# Patient Record
Sex: Male | Born: 1955
Health system: Southern US, Community
[De-identification: ages and names within clinical notes are randomized; demographics above are authoritative.]

## PROBLEM LIST (undated history)

## (undated) DIAGNOSIS — R29898 Other symptoms and signs involving the musculoskeletal system: Secondary | ICD-10-CM

## (undated) DIAGNOSIS — I10 Essential (primary) hypertension: Secondary | ICD-10-CM

## (undated) DIAGNOSIS — T7840XA Allergy, unspecified, initial encounter: Secondary | ICD-10-CM

## (undated) DIAGNOSIS — D649 Anemia, unspecified: Secondary | ICD-10-CM

## (undated) DIAGNOSIS — R4781 Slurred speech: Secondary | ICD-10-CM

## (undated) DIAGNOSIS — N400 Enlarged prostate without lower urinary tract symptoms: Secondary | ICD-10-CM

## (undated) DIAGNOSIS — N186 End stage renal disease: Secondary | ICD-10-CM

## (undated) DIAGNOSIS — N289 Disorder of kidney and ureter, unspecified: Secondary | ICD-10-CM

## (undated) DIAGNOSIS — B029 Zoster without complications: Secondary | ICD-10-CM

## (undated) HISTORY — DX: Disorder of kidney and ureter, unspecified: N28.9

## (undated) HISTORY — DX: Essential (primary) hypertension: I10

## (undated) HISTORY — DX: Zoster without complications: B02.9

## (undated) HISTORY — DX: Allergy, unspecified, initial encounter: T78.40XA

## (undated) HISTORY — DX: Benign prostatic hyperplasia without lower urinary tract symptoms: N40.0

---
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
## 1999-12-10 ENCOUNTER — Emergency Department (HOSPITAL_COMMUNITY): Admission: EM | Admit: 1999-12-10 | Discharge: 1999-12-10 | Payer: Self-pay | Admitting: Emergency Medicine

## 1999-12-10 ENCOUNTER — Encounter: Payer: Self-pay | Admitting: Emergency Medicine

## 2000-01-03 ENCOUNTER — Emergency Department (HOSPITAL_COMMUNITY): Admission: EM | Admit: 2000-01-03 | Discharge: 2000-01-03 | Payer: Self-pay | Admitting: Emergency Medicine

## 2001-01-26 ENCOUNTER — Encounter: Payer: Self-pay | Admitting: Internal Medicine

## 2001-01-26 LAB — CONVERTED CEMR LAB: PSA: 0.5 ng/mL

## 2001-01-29 ENCOUNTER — Encounter: Payer: Self-pay | Admitting: Family Medicine

## 2005-12-01 DIAGNOSIS — B029 Zoster without complications: Secondary | ICD-10-CM

## 2005-12-01 HISTORY — DX: Zoster without complications: B02.9

## 2006-02-05 ENCOUNTER — Emergency Department (HOSPITAL_COMMUNITY): Admission: EM | Admit: 2006-02-05 | Discharge: 2006-02-05 | Payer: Self-pay | Admitting: Family Medicine

## 2006-02-07 ENCOUNTER — Ambulatory Visit: Payer: Self-pay | Admitting: Infectious Diseases

## 2006-02-07 ENCOUNTER — Inpatient Hospital Stay (HOSPITAL_COMMUNITY): Admission: EM | Admit: 2006-02-07 | Discharge: 2006-02-10 | Payer: Self-pay | Admitting: Family Medicine

## 2006-02-20 ENCOUNTER — Ambulatory Visit: Payer: Self-pay | Admitting: Family Medicine

## 2006-03-09 ENCOUNTER — Ambulatory Visit: Payer: Self-pay | Admitting: Family Medicine

## 2007-01-17 ENCOUNTER — Emergency Department (HOSPITAL_COMMUNITY): Admission: EM | Admit: 2007-01-17 | Discharge: 2007-01-17 | Payer: Self-pay | Admitting: Emergency Medicine

## 2007-02-01 ENCOUNTER — Ambulatory Visit: Payer: Self-pay | Admitting: Internal Medicine

## 2007-02-15 ENCOUNTER — Ambulatory Visit: Payer: Self-pay | Admitting: Internal Medicine

## 2007-03-18 ENCOUNTER — Ambulatory Visit: Payer: Self-pay | Admitting: Internal Medicine

## 2007-03-18 LAB — CONVERTED CEMR LAB
Albumin: 4.1 g/dL (ref 3.5–5.2)
BUN: 16 mg/dL (ref 6–23)
CO2: 30 meq/L (ref 19–32)
Calcium: 9.7 mg/dL (ref 8.4–10.5)
Chloride: 97 meq/L (ref 96–112)
Creatinine, Ser: 1.4 mg/dL (ref 0.4–1.5)
GFR calc Af Amer: 69 mL/min
GFR calc non Af Amer: 57 mL/min
Glucose, Bld: 97 mg/dL (ref 70–99)
Phosphorus: 2.6 mg/dL (ref 2.3–4.6)
Potassium: 3.3 meq/L — ABNORMAL LOW (ref 3.5–5.1)
Sodium: 137 meq/L (ref 135–145)

## 2007-03-26 ENCOUNTER — Ambulatory Visit: Payer: Self-pay | Admitting: Internal Medicine

## 2007-03-26 LAB — CONVERTED CEMR LAB: Potassium: 3.5 meq/L (ref 3.5–5.1)

## 2007-08-19 ENCOUNTER — Encounter: Payer: Self-pay | Admitting: Internal Medicine

## 2007-08-19 DIAGNOSIS — I10 Essential (primary) hypertension: Secondary | ICD-10-CM

## 2007-09-13 ENCOUNTER — Ambulatory Visit: Payer: Self-pay | Admitting: Internal Medicine

## 2007-09-17 LAB — CONVERTED CEMR LAB
Albumin: 4 g/dL (ref 3.5–5.2)
BUN: 23 mg/dL (ref 6–23)
CO2: 30 meq/L (ref 19–32)
Calcium: 9.7 mg/dL (ref 8.4–10.5)
Chloride: 101 meq/L (ref 96–112)
Cholesterol: 183 mg/dL (ref 0–200)
Creatinine, Ser: 1.6 mg/dL — ABNORMAL HIGH (ref 0.4–1.5)
GFR calc Af Amer: 59 mL/min
GFR calc non Af Amer: 48 mL/min
Glucose, Bld: 108 mg/dL — ABNORMAL HIGH (ref 70–99)
HDL: 45.1 mg/dL (ref >39.0)
LDL Cholesterol: 112 mg/dL — ABNORMAL HIGH (ref 0–99)
PSA: 0.76 ng/mL (ref 0.10–4.00)
Phosphorus: 1.8 mg/dL — ABNORMAL LOW (ref 2.3–4.6)
Potassium: 3.2 meq/L — ABNORMAL LOW (ref 3.5–5.1)
Sodium: 140 meq/L (ref 135–145)
Total CHOL/HDL Ratio: 4.1
Triglycerides: 129 mg/dL (ref 0–149)
VLDL: 26 mg/dL (ref 0–40)

## 2008-01-04 ENCOUNTER — Ambulatory Visit: Payer: Self-pay | Admitting: Internal Medicine

## 2008-01-04 DIAGNOSIS — N400 Enlarged prostate without lower urinary tract symptoms: Secondary | ICD-10-CM

## 2008-03-06 ENCOUNTER — Ambulatory Visit: Payer: Self-pay | Admitting: Internal Medicine

## 2008-03-06 DIAGNOSIS — N529 Male erectile dysfunction, unspecified: Secondary | ICD-10-CM | POA: Insufficient documentation

## 2008-05-09 ENCOUNTER — Ambulatory Visit: Payer: Self-pay | Admitting: Internal Medicine

## 2010-02-28 ENCOUNTER — Ambulatory Visit: Payer: Self-pay | Admitting: Family Medicine

## 2010-02-28 DIAGNOSIS — I10 Essential (primary) hypertension: Secondary | ICD-10-CM

## 2010-02-28 DIAGNOSIS — M549 Dorsalgia, unspecified: Secondary | ICD-10-CM | POA: Insufficient documentation

## 2010-03-04 ENCOUNTER — Telehealth: Payer: Self-pay | Admitting: Internal Medicine

## 2010-03-05 LAB — CONVERTED CEMR LAB
ALT: 24 units/L (ref 0–53)
AST: 26 units/L (ref 0–37)
Albumin: 4.1 g/dL (ref 3.5–5.2)
Alkaline Phosphatase: 83 units/L (ref 39–117)
BUN: 22 mg/dL (ref 6–23)
Bilirubin, Direct: 0 mg/dL (ref 0.0–0.3)
CO2: 30 meq/L (ref 19–32)
Calcium: 9.1 mg/dL (ref 8.4–10.5)
Chloride: 98 meq/L (ref 96–112)
Creatinine, Ser: 2.6 mg/dL — ABNORMAL HIGH (ref 0.4–1.5)
Creatinine,U: 141 mg/dL
GFR calc non Af Amer: 33.16 mL/min (ref >60)
Glucose, Bld: 113 mg/dL — ABNORMAL HIGH (ref 70–99)
Microalb Creat Ratio: 5337.6 mg/g — ABNORMAL HIGH (ref 0.0–30.0)
Microalb, Ur: 752.6 mg/dL — ABNORMAL HIGH (ref 0.0–1.9)
Phosphorus: 2.8 mg/dL (ref 2.3–4.6)
Potassium: 3.6 meq/L (ref 3.5–5.1)
Sodium: 137 meq/L (ref 135–145)
Total Bilirubin: 0.3 mg/dL (ref 0.3–1.2)
Total Protein: 7.3 g/dL (ref 6.0–8.3)

## 2010-03-07 ENCOUNTER — Ambulatory Visit: Payer: Self-pay | Admitting: Internal Medicine

## 2010-04-08 ENCOUNTER — Ambulatory Visit: Payer: Self-pay | Admitting: Internal Medicine

## 2010-04-10 LAB — CONVERTED CEMR LAB
Albumin: 4.2 g/dL (ref 3.5–5.2)
BUN: 25 mg/dL — ABNORMAL HIGH (ref 6–23)
CO2: 26 meq/L (ref 19–32)
Calcium: 9.6 mg/dL (ref 8.4–10.5)
Chloride: 105 meq/L (ref 96–112)
Creatinine, Ser: 2.1 mg/dL — ABNORMAL HIGH (ref 0.4–1.5)
GFR calc non Af Amer: 41.49 mL/min (ref >60)
Glucose, Bld: 94 mg/dL (ref 70–99)
Phosphorus: 2.9 mg/dL (ref 2.3–4.6)
Potassium: 4.4 meq/L (ref 3.5–5.1)
Sodium: 140 meq/L (ref 135–145)

## 2010-12-31 NOTE — Assessment & Plan Note (Signed)
Summary: 1:30 check BP/ds   Vital Signs:  Patient profile:   55 year old male Weight:      207 pounds Temp:     98.7 degrees F oral Pulse rate:   80 / minute Pulse rhythm:   regular BP sitting:   170 / 80  (left arm) Cuff size:   large  Vitals Entered By: Edwin Dada CMA Deborra Medina) (March 07, 2010 1:34 PM)  Serial Vital Signs/Assessments:  Time      Position  BP       Pulse  Resp  Temp     By           R Arm     180/92                         Claris Gower MD  CC: back pain and check BP   History of Present Illness: Back pain is better Relates this to past problem when he had a kidney infection No urinary urgency or dysuria  Started the BP meds and it wasn't low enough Now taking both two times a day  BP much better on this regimen  No chest pain No SOB No edema  He is willing to take these meds since he has not noted any ED on this regimen  Has switched to salt substitute  Allergies: 1)  * Sulfa (Sulfonamides) Group 2)  Chlorthalidone (Chlorthalidone)  Past History:  Past medical, surgical, family and social histories (including risk factors) reviewed for relevance to current acute and chronic problems.  Past Medical History: Reviewed history from 01/04/2008 and no changes required. Allergic rhinitis Hypertension Renal insufficiency Benign prostatic hypertrophy  Past Surgical History: Reviewed history from 08/19/2007 and no changes required. Facial [place]- hospital 2007  Family History: Reviewed history from 08/19/2007 and no changes required. HTN in Mom and brother No DM or CAD No prostate or colon cancer  Social History: Reviewed history from 08/19/2007 and no changes required. Marital Status: Married Children: 2 Healthy Occupation: Museum/gallery curator, building new homes Never Smoked Alcohol use-occ  Review of Systems       appetite is okay weight stable  Physical Exam  General:  alert and normal appearance.   Neck:  supple, no masses, no  thyromegaly, and no cervical lymphadenopathy.   Lungs:  normal respiratory effort and normal breath sounds.   Heart:  normal rate, regular rhythm, no murmur, and no gallop.   Abdomen:  soft and non-tender.   No abdominal bruits Extremities:  no edema Psych:  normally interactive, good eye contact, not anxious appearing, and not depressed appearing.     Impression & Recommendations:  Problem # 1:  HYPERTENSION, MALIGNANT (ICD-401.0) Assessment Improved BP markedly better he accepts these meds since no ED markedly proteinuria and creat up to 2.6  P: continue increased doses     recheck 1 month with repeat renal panel    didn't tolerate lisinopril (though not clear cut)    may be worth trial of losartan but I will send to nephrology if creat not down at next visit  His updated medication list for this problem includes:    Amlodipine Besylate 10 Mg Tabs (Amlodipine besylate) .Marland Kitchen... 1 by mouth two times a day for high blood pressure    Bystolic 10 Mg Tabs (Nebivolol hcl) .Marland Kitchen... 1 by mouth two times a day for high blood pressure  BP today: 170/80 Prior BP: 200/120 (02/28/2010)  Labs Reviewed: K+:  3.6 (02/28/2010) Creat: : 2.6 (02/28/2010)   Chol: 183 (09/13/2007)   HDL: 45.1 (09/13/2007)   LDL: 112 (09/13/2007)   TG: 129 (09/13/2007)  Complete Medication List: 1)  Amlodipine Besylate 10 Mg Tabs (Amlodipine besylate) .Marland Kitchen.. 1 by mouth two times a day for high blood pressure 2)  Bystolic 10 Mg Tabs (Nebivolol hcl) .Marland Kitchen.. 1 by mouth two times a day for high blood pressure  Patient Instructions: 1)  Please schedule a follow-up appointment in 1 month.  Prescriptions: BYSTOLIC 10 MG TABS (NEBIVOLOL HCL) 1 by mouth two times a day for high blood pressure  #60 x 11   Entered and Authorized by:   Claris Gower MD   Signed by:   Claris Gower MD on 03/07/2010   Method used:   Print then Give to Patient   RxID:   [id number] AMLODIPINE BESYLATE 10 MG TABS (AMLODIPINE BESYLATE) 1  by mouth two times a day for high blood pressure  #60 x 11   Entered and Authorized by:   Claris Gower MD   Signed by:   Claris Gower MD on 03/07/2010   Method used:   Print then Give to Patient   RxIDWR:[id number]   Current Allergies (reviewed today): * SULFA (SULFONAMIDES) GROUP CHLORTHALIDONE (CHLORTHALIDONE)

## 2010-12-31 NOTE — Progress Notes (Signed)
Summary: BP reading  Phone Note Call from Patient Call back at [phone]   Caller: Wife,  [place] Schuman Call For: Claris Gower MD Summary of Call: wife calling with BP readings: Sat 174/105, Sun 172/120, Monday A.M. 185/115, pt bought his own BP machine and that's how they were checking it. Pt wife states the meds are working. Initial call taken by: Edwin Dada CMA Deborra Medina),  March 04, 2010 9:43 AM  Follow-up for Phone Call        Pt's wife called to report that pt was unable to take the pain medications that were given to him for his back- these caused nausea, he is using eucalyptus cream.    [place]  March 04, 2010 11:09 AM   Please call her These are better but still quite high he still needs an appt this week to reevaluate---please make sure they schedule Follow-up by: Claris Gower MD,  March 04, 2010 1:56 PM  Additional Follow-up for Phone Call Additional follow up Details #1::        spoke with wife and she will TRY and get pt to come in for appt, she will call back and schedule. DeShannon Smith CMA Deborra Medina)  March 04, 2010 3:21 PM   That does not make me very optimistic we cannot refill any meds without seeing him!!! Additional Follow-up by: Claris Gower MD,  March 04, 2010 3:40 PM

## 2010-12-31 NOTE — Assessment & Plan Note (Signed)
Summary: BACK PAIN/CLE   Vital Signs:  Patient profile:   55 year old male Height:      68 inches Weight:      206 pounds BMI:     31.44 Temp:     98.3 degrees F oral Pulse rate:   72 / minute Pulse rhythm:   regular BP sitting:   200 / 120  (left arm) Cuff size:   large  Vitals Entered By: Zenda Alpers CMA Deborra Medina) (February 28, 2010 10:38 AM)  Serial Vital Signs/Assessments:  Time      Position  BP       Pulse  Resp  Temp     By                     240/140                        Owens Loffler MD                     240/140                        Owens Loffler MD   History of Present Illness: Chief complaint left hip pain for 8 days  55 year old with left hip pain and also found to have malignant hypertension.  1. Back pain: Woke up and on his left back, is a tooth ache an dreally hurting bad. Every once and a while will hurt pretty bad. Hurting bad.   Jumped off of sometime and did not really  Fixing a leak and jumped off about two weeks ago.    L post back pain No bowel or bladder incontince  2. Malignant HTN:  refuses admission. He has not taken his blood pressure medication in 6 months Feels fine stopped it due to interference with erectile quality  To more clearly define VS: Bp initially [id number] systolic, taken by CMA student. Rechecked by CMA, 240/140 given Clonidine 0.1 mg in office, repeat 240/140 Then given Clonodine 0.1 mg and Bystolic 10 mg in the office On recheck 200/100, and patient d/c to home.   Allergies: 1)  * Sulfa (Sulfonamides) Group 2)  Chlorthalidone (Chlorthalidone)  Past History:  Past medical, surgical, family and social histories (including risk factors) reviewed, and no changes noted (except as noted below).  Past Medical History: Reviewed history from 01/04/2008 and no changes required. Allergic rhinitis Hypertension Renal insufficiency Benign prostatic hypertrophy  Past Surgical History: Reviewed history from 08/19/2007  and no changes required. Facial [place]- hospital 2007  Family History: Reviewed history from 08/19/2007 and no changes required. HTN in Mom and brother No DM or CAD No prostate or colon cancer  Social History: Reviewed history from 08/19/2007 and no changes required. 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  General:  Denies chills, fatigue, and fever. CV:  Denies chest pain or discomfort, palpitations, and shortness of breath with exertion. Resp:  Denies cough. GI:  Denies nausea. GU:  Complains of erectile dysfunction; denies incontinence. MS:  Complains of low back pain, muscle aches, and stiffness; denies joint pain, joint redness, joint swelling, and loss of strength. Neuro:  Denies tingling.  Physical Exam  General:  Well-developed,well-nourished,in no acute distress; alert,appropriate and cooperative throughout examination Head:  Normocephalic and atraumatic without obvious abnormalities. No apparent alopecia or balding. Ears:  External ear exam shows no significant lesions or deformities.  Otoscopic examination reveals clear canals, tympanic membranes are intact bilaterally without bulging, retraction, inflammation or discharge. Hearing is grossly normal bilaterally. Nose:  External nasal examination shows no deformity or inflammation. Nasal mucosa are pink and moist without lesions or exudates. Mouth:  Oral mucosa and oropharynx without lesions or exudates.  Teeth in good repair. Neck:  No deformities, masses, or tenderness noted. Lungs:  Normal respiratory effort, chest expands symmetrically. Lungs are clear to auscultation, no crackles or wheezes. Heart:  Normal rate and regular rhythm. S1 and S2 normal  without gallop, murmur, click, rub or other extra sounds. Abdomen:  Bowel sounds positive,abdomen soft and non-tender without masses, organomegaly or hernias noted. Msk:  Inspection/Deformity: N Paraspinus T: Y - L lumber L5, at sciatic notch  B Ankle Dorsiflexion (L5,4): 5/5 B Great Toe Dorsiflexion (L5,4): 5/5 Heel Walk (L5): WNL Toe Walk (S1): WNL Rise/Squat (L4): WNL, mild pain  SENSORY B Medial Foot (L4): WNL B Dorsum (L5): WNL B Lateral (S1): WNL Light Touch: WNL Pinprick: WNL  REFLEXES Knee (L4): 2+ Ankle (S1): 2+  B SLR, seated: neg B SLR, supine: neg B FABER: neg B Reverse FABER: neg B Greater Troch: NT B Sciatic Notch: L TTP Extremities:  No clubbing, cyanosis, edema, or deformity noted with normal full range of motion of all joints.   Neurologic:  alert & oriented X3, sensation intact to light touch, and gait normal.   Cervical Nodes:  No lymphadenopathy noted Psych:  Cognition and judgment appear intact. Alert and cooperative with normal attention span and concentration. No apparent delusions, illusions, hallucinations   Impression & Recommendations:  Problem # 1:  BACK PAIN (ICD-724.5) Assessment New muscular back pain reviewed care, muscle relaxants, pain meds as needed moist heat  no NSAIDS with BP acutely high like now  His updated medication list for this problem includes:    Tizanidine Hcl 4 Mg Tabs (Tizanidine hcl) .Marland Kitchen... 1 by mouth at bedtime as needed back pain    Tramadol Hcl 50 Mg Tabs (Tramadol hcl) .Marland Kitchen... 1 by mouth 4 times daily as needed pain  Problem # 2:  HYPERTENSION, MALIGNANT (ICD-401.0) Assessment: New we discussed at this  is a life-threatening  blood pressure level.  We also discussed that classically with a blood pressure of 240/140,  inpatient admission until BP is adequately controlled is recommended. The patient declined admission, but was willing to get blood work and go back on medications. Discussed that BP at this high puts  him at serious risk for stroke.  Patient kept in office >1 hour, total, intermittent checking, intermittent discussion about these issues. Stable with BP [id number] systolic at time of d/c, given #14 day supply of Bystolic 10 mg in the office. Due to side effect profile with erections, Amlodipine and Bystolic chosen.   f/u with Dr. Silvio Pate in 1 week.  The following medications were removed from the medication list:    Amlodipine Besylate 10 Mg Tabs (Amlodipine besylate) .Marland Kitchen... 1 tablet by mouth every morning    Doxazosin Mesylate 4 Mg Tabs (Doxazosin mesylate) .Marland Kitchen... 1 at bedtime for enlarged prostate    Lisinopril 20 Mg Tabs (Lisinopril) .Marland Kitchen... 1 tablet daily at bedtime His updated medication list for this problem includes:    Amlodipine Besylate 10  Mg Tabs (Amlodipine besylate) .Marland Kitchen... 1 by mouth daily    Bystolic 10 Mg Tabs (Nebivolol hcl) .Marland Kitchen... 1 by mouth daily  Orders: Venipuncture [id number]) TLB-Renal Function Panel (80069-RENAL) TLB-Hepatic/Liver Function Pnl (80076-HEPATIC) TLB-Microalbumin/Creat Ratio, Urine (82043-MALB)  Problem # 3:  ERECTILE DYSFUNCTION, ORGANIC [id number]) Assessment: New chose BP meds in light of this.  Complete Medication List: 1)  Amlodipine Besylate 10 Mg Tabs (Amlodipine besylate) .Marland Kitchen.. 1 by mouth daily 2)  Bystolic 10 Mg Tabs (Nebivolol hcl) .Marland Kitchen.. 1 by mouth daily 3)  Tizanidine Hcl 4 Mg Tabs (Tizanidine hcl) .Marland Kitchen.. 1 by mouth at bedtime as needed back pain 4)  Tramadol Hcl 50 Mg Tabs (Tramadol hcl) .Marland Kitchen.. 1 by mouth 4 times daily as needed pain  Patient Instructions: 1)  f/u 1 week with Dr. Silvio Pate Prescriptions: TRAMADOL HCL 50 MG  TABS (TRAMADOL HCL) 1 by mouth 4 times daily as needed pain  #40 x 2   Entered and Authorized by:   Owens Loffler MD   Signed by:   Owens Loffler MD on 02/28/2010   Method used:   Print then Give to Patient   RxID:   [id number] TIZANIDINE HCL 4 MG TABS (TIZANIDINE HCL) 1 by mouth at bedtime as needed back pain  #30 x 2    Entered and Authorized by:   Owens Loffler MD   Signed by:   Owens Loffler MD on 02/28/2010   Method used:   Print then Give to Patient   RxID:   [id number] BYSTOLIC 10 MG TABS (NEBIVOLOL HCL) 1 by mouth daily  #30 x 5   Entered and Authorized by:   Owens Loffler MD   Signed by:   Owens Loffler MD on 02/28/2010   Method used:   Print then Give to Patient   RxIDKR:[id number] AMLODIPINE BESYLATE 10 MG TABS (AMLODIPINE BESYLATE) 1 by mouth daily  #30 x 5   Entered and Authorized by:   Owens Loffler MD   Signed by:   Owens Loffler MD on 02/28/2010   Method used:   Print then Give to Patient   RxIDOM:[id number]   Current Allergies (reviewed today): * SULFA (SULFONAMIDES) GROUP CHLORTHALIDONE (CHLORTHALIDONE)   Appended Document: BACK PAIN/CLE Please call him today to see how he feels Ask him to check his BP this weekend--like in a pharmacy If stll >200/110, we need to see him on Monday  Appended Document: BACK PAIN/CLE left message on home phone voicemail, used cell phone because our phone lines are down, asked pt to call on-call to let us know what his BP is based on if he needs appt on Monday or not, also advised pt I will call on Monday AM to check on him also.

## 2010-12-31 NOTE — Assessment & Plan Note (Signed)
Summary: CHECK BP/DLO   Vital Signs:  Patient profile:   55 year old male Weight:      204 pounds Temp:     98.4 degrees F oral Pulse rate:   60 / minute Pulse rhythm:   regular BP sitting:   168 / 70  (left arm) Cuff size:   large  Vitals Entered By: Edwin Dada CMA Deborra Medina) (Apr 08, 2010 4:39 PM) CC: follow-up visit for blood pressure   History of Present Illness: No problems with the meds remembers them daily  no dizziness no sexual problems--actually seems better No chest pain No SOB no headaches  has cut back on salt  Allergies: 1)  * Sulfa (Sulfonamides) Group 2)  Chlorthalidone (Chlorthalidone)  Past History:  Past medical, surgical, family and social histories (including risk factors) reviewed for relevance to current acute and chronic problems.  Past Medical History: Reviewed history from 01/04/2008 and no changes required. Allergic rhinitis Hypertension Renal insufficiency Benign prostatic hypertrophy  Past Surgical History: Reviewed history from 08/19/2007 and no changes required. Facial [place]- hospital 2007  Family History: Reviewed history from 08/19/2007 and no changes required. HTN in Mom and brother No DM or CAD No prostate or colon cancer  Social History: Reviewed history from 08/19/2007 and no changes required. 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   Neck:  supple, no masses, no thyromegaly, no carotid bruits, and no cervical lymphadenopathy.   Lungs:  normal respiratory effort and normal breath sounds.   Heart:  normal rate, regular rhythm, no murmur, and no gallop.   Extremities:  no edema Psych:  normally interactive, good eye contact, not anxious appearing, and not depressed appearing.     Impression &  Recommendations:  Problem # 1:  HYPERTENSION, MALIGNANT (ICD-401.0) Assessment Improved BP much better tolerating meds okay if renal function no better, will send to nephrology [facility])  His updated medication list for this problem includes:    Amlodipine Besylate 10 Mg Tabs (Amlodipine besylate) .Marland Kitchen... 1 by mouth two times a day for high blood pressure    Bystolic 10 Mg Tabs (Nebivolol hcl) .Marland Kitchen... 1 by mouth two times a day for high blood pressure  BP today: 168/70 Prior BP: 170/80 (03/07/2010)  Labs Reviewed: K+: 3.6 (02/28/2010) Creat: : 2.6 (02/28/2010)   Chol: 183 (09/13/2007)   HDL: 45.1 (09/13/2007)   LDL: 112 (09/13/2007)   TG: 129 (09/13/2007)  Complete Medication List: 1)  Amlodipine Besylate 10 Mg Tabs (Amlodipine besylate) .Marland Kitchen.. 1 by mouth two times a day for high blood pressure 2)  Bystolic 10 Mg Tabs (Nebivolol hcl) .Marland Kitchen.. 1 by mouth two times a day for high blood pressure  Other Orders: Venipuncture [id number]) TLB-Renal Function Panel (80069-RENAL)  Patient Instructions: 1)  Please schedule a follow-up appointment in 6 months .   Current Allergies (reviewed today): * SULFA (SULFONAMIDES) GROUP CHLORTHALIDONE (CHLORTHALIDONE)

## 2011-04-04 ENCOUNTER — Other Ambulatory Visit: Payer: Self-pay | Admitting: *Deleted

## 2011-04-04 MED ORDER — NEBIVOLOL HCL 10 MG PO TABS: ORAL_TABLET | ORAL | Status: DC

## 2011-04-04 MED ORDER — AMLODIPINE BESYLATE 10 MG PO TABS: ORAL_TABLET | ORAL | Status: DC

## 2011-04-07 ENCOUNTER — Telehealth: Payer: Self-pay | Admitting: *Deleted

## 2011-04-07 NOTE — Telephone Encounter (Signed)
Left message on machine to have patient return my calls, pt asking for refills but haven't been seen in over a year, pt needs to make an appt before refills can be done.

## 2011-04-08 MED ORDER — NEBIVOLOL HCL 10 MG PO TABS: ORAL_TABLET | ORAL | Status: DC

## 2011-04-08 MED ORDER — AMLODIPINE BESYLATE 10 MG PO TABS: ORAL_TABLET | ORAL | Status: DC

## 2011-04-08 NOTE — Telephone Encounter (Signed)
rx sent to pharmacy, by e-script

## 2011-04-08 NOTE — Telephone Encounter (Signed)
Pt has scheduled appt for 5/15.   He will run out of meds today.

## 2011-04-14 ENCOUNTER — Encounter: Payer: Self-pay | Admitting: Internal Medicine

## 2011-04-15 ENCOUNTER — Encounter: Payer: Self-pay | Admitting: Internal Medicine

## 2011-04-15 ENCOUNTER — Ambulatory Visit (INDEPENDENT_AMBULATORY_CARE_PROVIDER_SITE_OTHER): Payer: Managed Care, Other (non HMO) | Admitting: Internal Medicine

## 2011-04-15 VITALS — BP 150/80 | HR 56 | Temp 98.9°F | Ht 68.0 in | Wt 200.0 lb

## 2011-04-15 DIAGNOSIS — I10 Essential (primary) hypertension: Secondary | ICD-10-CM

## 2011-04-15 DIAGNOSIS — M25559 Pain in unspecified hip: Secondary | ICD-10-CM

## 2011-04-15 DIAGNOSIS — M25552 Pain in left hip: Secondary | ICD-10-CM

## 2011-04-15 DIAGNOSIS — N259 Disorder resulting from impaired renal tubular function, unspecified: Secondary | ICD-10-CM

## 2011-04-15 DIAGNOSIS — N400 Enlarged prostate without lower urinary tract symptoms: Secondary | ICD-10-CM

## 2011-04-15 LAB — CBC WITH DIFFERENTIAL/PLATELET
Basophils Relative: 0.5 % (ref 0.0–3.0)
Eosinophils Relative: 2.5 % (ref 0.0–5.0)
HCT: 42 % (ref 39.0–52.0)
MCV: 82.3 fl (ref 78.0–100.0)
Monocytes Absolute: 0.6 10*3/uL (ref 0.1–1.0)
Monocytes Relative: 6.5 % (ref 3.0–12.0)
Neutrophils Relative %: 63.7 % (ref 43.0–77.0)
Platelets: 336 10*3/uL (ref 150.0–400.0)
RBC: 5.1 Mil/uL (ref 4.22–5.81)
WBC: 9 10*3/uL (ref 4.5–10.5)

## 2011-04-15 LAB — BASIC METABOLIC PANEL
BUN: 21 mg/dL (ref 6–23)
Calcium: 9.4 mg/dL (ref 8.4–10.5)
Creatinine, Ser: 1.9 mg/dL — ABNORMAL HIGH (ref 0.4–1.5)

## 2011-04-15 LAB — HEPATIC FUNCTION PANEL
Bilirubin, Direct: 0.1 mg/dL (ref 0.0–0.3)
Total Bilirubin: 0.5 mg/dL (ref 0.3–1.2)
Total Protein: 7.2 g/dL (ref 6.0–8.3)

## 2011-04-15 LAB — TSH: TSH: 1.73 u[IU]/mL (ref 0.35–5.50)

## 2011-04-15 MED ORDER — CARVEDILOL 25 MG PO TABS: 25.0000 mg | ORAL_TABLET | Freq: Two times a day (BID) | ORAL | Status: DC

## 2011-04-15 NOTE — Progress Notes (Signed)
  Subjective:    Patient ID: Devin Harmon, male    DOB: [birth year], 55 y.o.   MRN: [id number]  HPI Wife changed jobs and has new insurance company Wants him to change to generic meds Needs to change the bystolic  Jumped off house about 1 year ago Left hip pain has been persistent Can have some trouble in bed--took [employer] [place] [facility] for a while but it raised blood pressure Now taking aspirin and this is better for BP Went to chiropractor--got x-ray Stiff in AM but loosens up as time goes on  No chest pain No SOB No headaches  Voids okay but is slower over the past couple of months Nocturia x 3 usually  Current outpatient prescriptions:amLODipine (NORVASC) 10 MG tablet, Take one tablet by mouth twice a day for high blood pressure., Disp: 60 tablet, Rfl: 0;  nebivolol (BYSTOLIC) 10 MG tablet, Take 1 tablet by mouth twice a day for high blood pressure., Disp: 60 tablet, Rfl: 0  Past Medical History  Diagnosis Date  . Allergy   . Hypertension   . Renal insufficiency   . BPH (benign prostatic hypertrophy)   . Zoster 2007    facial, hospital 2007    No past surgical history on file.  Family History  Problem Relation Age of Onset  . Hypertension Mother   . Hypertension Brother   . Coronary artery disease Neg Hx   . Diabetes Neg Hx   . Cancer Neg Hx     prostate or colon    History   Social History  . Marital Status: Married    Spouse Name: N/A    Number of Children: 2  . Years of Education: N/A   Occupational History  . Builder, building new homes    Social History Main Topics  . Smoking status: Never Smoker   . Smokeless tobacco: Not on file  . Alcohol Use: Yes     occasional  . Drug Use: Not on file  . Sexually Active: Not on file   Other Topics Concern  . Not on file   Social History Narrative  . No narrative on file   Review of Systems Sleeps okay despite the nocturia Appetite is fine Weight fairly stable    Objective:   Physical Exam  Constitutional: He  appears well-developed and well-nourished. No distress.  Neck: Normal range of motion. Neck supple. No thyromegaly present.  Cardiovascular: Normal rate, regular rhythm, normal heart sounds and intact distal pulses.  Exam reveals no gallop.   No murmur heard. Pulmonary/Chest: Effort normal and breath sounds normal. No respiratory distress. He has no wheezes. He has no rales.  Abdominal: Soft. There is no tenderness.       No renal bruits  Musculoskeletal: He exhibits no edema and no tenderness.       Left hip has very little internal rotation No tenderness  Lymphadenopathy:    He has no cervical adenopathy.  Psychiatric: He has a normal mood and affect. His behavior is normal. Judgment and thought content normal.          Assessment & Plan:

## 2011-04-15 NOTE — Patient Instructions (Signed)
Please try acetaminophen for your hip pain and not aspirin Please change the bystolic to the carvedilol. Do this change when you have at least 1-2 days off to be sure you don't have side effects

## 2011-04-18 NOTE — Discharge Summary (Signed)
Devin Harmon, Devin Harmon               ACCOUNT NO.:  [id number]   MEDICAL RECORD NO.:  [id number]          PATIENT TYPE:  INP   LOCATION:  5010                         FACILITY:  [facility]   PHYSICIAN:  Rexene Alberts, M.D.    DATE OF BIRTH:  October 26, 1956   DATE OF ADMISSION:  02/07/2006  DATE OF DISCHARGE:  02/10/2006                                 DISCHARGE SUMMARY   DISCHARGE DIAGNOSES:  1.  Left facial herpes zoster infection with superimposed impetigo .  2.  Hypertension.  3.  Renal insufficiency   DISCHARGE MEDICATIONS:  1.  Valtrex 1 tablet b.i.d. for 7 days.  2.  Doxycycline 100 milligrams b.i.d. for 7 days.  3.  Lopressor 25 milligrams b.i.d.  4.  Norvasc 10 milligrams daily.   DISCHARGE DISPOSITION:  Devin patient was discharged home in improved and  stable condition on March13,2007. He was advised to follow up with his new  primary care physician Dr. Glori Harmon at [facility] office of [facility]. Devin patient was also advised to call Dr. Johnnye Sima, infectious  diseases specialist, for Devin results of Devin HIV test that is currently  pending at Devin time of hospital discharge.   CONSULTATIONS:  1.  Ophthalmologist, Dr. Talbert Forest.  2.  Dr. Bobby Rumpf, infectious diseases specialist.   HISTORY OF PRESENT ILLNESS:  Devin patient is a 55 year old man with no  significant past medical history prior to this hospitalization, who  presented to [facility] hospital on March10,2007 with vesicular lesions on Devin left  side of his face which spread initially from Devin left side of his mouth  particularly from his lips. Devin vesicular lesions progressively worsened.  They were accompanied by pain, redness and swelling on Devin left side of Devin  face. [facility] patient [facility]. He was given amoxicillin and clindamycin for a  presumptive diagnosis of bacterial cellulitis. He was also treated with  Vicodin for pain. However, over Devin  course of Devin five days of treatment  with antibiotic therapy, Devin patient's symptoms did not improve but only  worsened. Devin patient was therefore admitted for further evaluation and  management.   For additional details, please see Devin excellent history and physical  dictated by Dr. Joette Harmon.   HOSPITAL COURSE:  1.  LEFT FACIAL HERPES ZOSTER INFECTION WITH SUPERIMPOSED IMPETIGO: Blood      cultures were ordered during Devin evaluation in Devin emergency department.      There was a concern about ophthalmic involvement of what was clearly a      herpes zoster infection of Devin left side of Devin face. Devin patient was      started immediately on intravenous acyclovir and vancomycin. An      ophthalmology consultation was requested. Ophthalmologist, Dr. Talbert Forest      evaluated Devin patient Devin following day. Per his assessment, Devin      infection appeared to be affecting cranial nerve VII with no signs of      ocular involvement. He agreed with current therapy with acyclovir  and      vancomycin. In addition to blood cultures, a culture of one of Devin      vesicular lesions was obtained as well. Devin final culture results are      pending, however, they are negative so far. Devin blood cultures also      remain negative, however, Devin final results are pending at Devin time of      hospital discharge.   Infectious diseases physician, Dr. Johnnye Sima, was consulted for further  evaluation and recommendations. He recommended increasing Devin dose of  acyclovir to 900 milligrams IV q.12h. He agreed with vancomycin therapy at  Devin time of his assessment. He also recommended ruling out HIV. Devin patient  consented to Devin HIV testing. Results are pending at Devin time of hospital  discharge.   Devin patient improved clinically. Some of Devin vesicular lesions have started  to crust over. He did not have significant pain during Devin hospital course.  At Devin time of hospital discharge, Dr. Johnnye Sima recommended  outpatient  therapy with Valtrex b.i.d. for an additional seven days as well as  doxycycline b.i.d. for an additional seven days. Devin patient was advised to  call Dr. Johnnye Sima for Devin results of Devin HIV ELISA.   Problem 2: HYPERTENSION/RENAL INSUFFICIENCY: Devin patient gave no prior  history of hypertension. On admission, his blood pressure was quite elevated  at 228/120. Devin patient was therefore started on antihypertensive therapy  with hydrochlorothiazide 25 milligrams daily and Norvasc 5 milligrams daily.  Devin patient could not tolerate hydrochlorothiazide and it was therefore  discontinued. Devin Norvasc was subsequently increased to 10 milligrams daily  and lisinopril at 10 milligrams daily was started. At Devin time of hospital  admission, his BUN was 20 and his creatinine was 1.4. However, following Devin  initiation of lisinopril, his creatinine increased to 1.8. Given this  finding, Devin lisinopril was discontinued and Devin patient was started on  Lopressor 12.5 b.i.d. and clonidine 0.1 milligrams b.i.d. At Devin time of  hospital discharge, however, it was decided that Devin patient would be  discharged home on a two drug regimen consisting of Lopressor 25 milligrams  b.i.d. and Norvasc 10 milligrams daily. Clonidine was discontinued secondary  to his propensity to cause rebound hypertension in Devin setting of a patient  who had no prior history of regular doctor's visits. Further evaluation and  management per his new primary care physician Dr. Glori [facility] at [facility]   Labs pending. HIV antibody/ELISA.      Rexene Alberts, M.D.  Electronically Signed     DF/MEDQ  D:  02/11/2006  T:  02/12/2006  Job:  [id number]   cc:   Marne A. Tower, M.D. [facility]  [address], [place]  [place] [address]

## 2011-04-18 NOTE — H&P (Signed)
NAMEBAMIDELE, VILLASENOR               ACCOUNT NO.:  [id number]   MEDICAL RECORD NO.:  [id number]          PATIENT TYPE:  INP   LOCATION:  5010                         FACILITY:  [place]   PHYSICIAN:  Cherene Altes, M.D.DATE OF BIRTH:  1956-07-31   DATE OF ADMISSION:  02/07/2006  DATE OF DISCHARGE:                                HISTORY & PHYSICAL   PRIMARY CARE PHYSICIAN:  Unassigned.   CHIEF COMPLAINT:  Facial cellulitis.   HISTORY OF PRESENT ILLNESS:  Mr. Devin Harmon is a pleasant 55 year old  gentleman with an insignificant past medical history. 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 He and this family member drink from the  water source/glass all throughout the day on a regular basis. The following  day, the patient began to feel itching over his lip. Within 48 hours, he had  a blister that developed in the left-hand corner of his mouth. This was  very itchy and tingly. He began to scratch it. He then began to notice new  lesions showing up on the upper part of the lip and up into the philtrum.  These lesions were also irritated and itchy. The patient admits that he  scratched at these lesions multiple times. He then thinks that he proceeded  to scratch the side of his head as well. Over the last 48 hours, he has  developed severe erythema of the entire left side of his face. This is  associated with multiple vesicular lesions around the left corner of the  mouth, the left portion of the upper lip, and spreading up on to the  zygomatic area. There was also a cluster of lesions in the parietal region  of the scalp. Again, the patient does admit scratching his lip and then  scratching these areas as well.   The patient has been seen at the [facility] at [facility]. He was given amoxicillin and clindamycin and a presumptive  diagnosis of bacterial cellulitis was  made. He was also given Vicodin. He  reports that his wife confirmed that he has been taking his antibiotics  religiously. He also reports significant headache and dizziness when taking  the Vicodin. Otherwise, he feels that his lesions have simply worsened.   REVIEW OF SYSTEMS:  The patient specifically denies any change in the visual  acuity. He has had no pain or sense of scratchiness in the left eye. There  has been no significant discharge from the left eye, though he does note  that the lid of his left eye are beginning to swell somewhat.   PAST MEDICAL HISTORY:  Routine sutures due to occupational accidents but no  other significant past medical history and no prior hospitalization.   MEDICATIONS:  Amoxicillin and clindamycin at unclear doses initiated in the  last 48 hours. Blood pressure pill initiated in the last 48 hours but not  chronically taking. Vicodin p.r.n.   ALLERGIES:  SULFA causes diffuse hives.   FAMILY  HISTORY:  The patient's father had his first MI at age 66. The  patient's mother is healthy and alive with a prior history of hypertension.   SOCIAL HISTORY:  The patient occasionally partakes of alcohol but not more  than one drink a day on average. He does not smoke. 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 He is  married and he has two healthy children.   LABORATORY DATA:  BMET is wholly unremarkable with the exception of elevated  serum glucose at 116. CBC is obtained and is entirely normal with the  exception to high normal white count at 9.5.   PHYSICAL EXAMINATION:  VITAL SIGNS: Temperature 98.1, blood pressure  228/120, heart rate 78, respiratory rate 14. O2 saturation 95% on room air.  GENERAL: Well-developed, well-nourished male in no acute distress.  NECK: No JVD. No lymphadenopathy.  LUNGS: Clear to auscultation bilaterally.  CARDIOVASCULAR: Regular rate and rhythm.  CUTANEOUS: The patient has  marked obvious cellulitis of the left side of the  face. The entire face from crease of the lips on the left up to the  periauricular area is erythematous and swollen. There are multiple lesions  across the face. Those just at the vermilion border and passing into the  edge of the mouth or vesicular and appear to be filled with a clear fluid.  There is some just above the lip itself in the left corner that are now  scabbed and crusting. In the zygomatic area, there are tense blisters and  also the appearance of blisters that are ruptured and are now covered in a  honey-crusted fluid. In the left parietal region of the scalp, there is  another cluster of very small vesicles, though these appear to be filled  with a straw-colored fluid.  EYES: The patient is not photophobic. I did examine the eye using a light  source. There is no evidence of corneal disruption at the present time.   IMPRESSION/PLAN:  1.  Left facial cellulitis: My primary concern is that this patient has a      severe herpes simplex virus-1 cellulitis. This would explain the failure      of the patient to improve despite his restrictive adherence to      amoxicillin and then clindamycin. This raises a severe concern of herpes      simplex virus ophthalmitis with high risk of corneal ulceration and      irreversible scarring. At present, the patient has no ophthalmologic      symptoms. We will admit the patient to allow aggressive therapy.      Acyclovir will be initiated via the IV route. I have informed the      patient of the high risk of ophthalmologic involvement and the need to      acutely address this with direct eye drops if it should develop. I have      asked him to inform the nurses immediately should he begin to have the      slightest bit of pain or burning in his eye. I will ask the nurses to     question the patient regularly regarding these symptoms. We must also      consider the possibility of a  methicillin-resistant Staphylococcus      aureus impetigo. Clindamycin would be an appropriate choice for this and      I suspect this is why the family practice doctor at the  Urgent [facility]      added this medication to the patient's amoxicillin. Without culture      data, we cannot prove that this methicillin-resistant Staphylococcus      aureus would, however, be susceptible to clindamycin. I will therefore      cover the patient with vancomycin to assure the patient is empirically      covered so as to help avoid complications with deep spread into the      cavernous sinuses should this in fact be a highly drug-resistant      bacterial infection.  2.  Severely uncontrolled hypertension: The patient has severely      uncontrolled hypertension. He reports that normally his blood pressure      is not elevated. This may simply be related to his acute infection and      discomfort as a result. We will treat the patient for now with HCTZ and      Norvasc. I do not wish to acutely drop him though as he may very well      maintain a high blood pressure normally. We will follow this blood      pressure trend and counsel him further as is appropriate during this      hospital based upon his vital signs.  3.  Elevated serum glucose: The patient has no prior history of diabetes      mellitus but does have a family history of such and scattered distant      relatives. I will check a hemoglobin A1c and if this is elevated we will      consider formal diagnosis with the fasting blood sugar. This may simply      be a stress reaction due to his acute infection.  4.  Mild dehydration on clinical exam and BUN and creatinine suggest a mild      dehydration. The patient will be hydrated judiciously with normal saline      100 cc an hour for 12 hours and then we will push p.o. intake. I suspect      his p.o. intake has been limited due to his eye infection and discomfort      associated with this.       Cherene Altes, M.D.  Electronically Signed     JTM/MEDQ  D:  02/07/2006  T:  02/07/2006  Job:  [id number]

## 2011-06-05 ENCOUNTER — Other Ambulatory Visit: Payer: Self-pay | Admitting: Internal Medicine

## 2011-06-07 ENCOUNTER — Other Ambulatory Visit: Payer: Self-pay | Admitting: Internal Medicine

## 2011-06-12 ENCOUNTER — Other Ambulatory Visit: Payer: Self-pay | Admitting: Internal Medicine

## 2011-06-13 NOTE — Telephone Encounter (Signed)
rx sent to pharmacy by e-script  

## 2011-06-20 ENCOUNTER — Encounter: Payer: Self-pay | Admitting: Internal Medicine

## 2011-06-20 ENCOUNTER — Ambulatory Visit (INDEPENDENT_AMBULATORY_CARE_PROVIDER_SITE_OTHER): Payer: Managed Care, Other (non HMO) | Admitting: Internal Medicine

## 2011-06-20 DIAGNOSIS — N400 Enlarged prostate without lower urinary tract symptoms: Secondary | ICD-10-CM

## 2011-06-20 DIAGNOSIS — I10 Essential (primary) hypertension: Secondary | ICD-10-CM

## 2011-06-20 DIAGNOSIS — Z23 Encounter for immunization: Secondary | ICD-10-CM

## 2011-06-20 DIAGNOSIS — N259 Disorder resulting from impaired renal tubular function, unspecified: Secondary | ICD-10-CM

## 2011-06-20 DIAGNOSIS — Z0000 Encounter for general adult medical examination without abnormal findings: Secondary | ICD-10-CM

## 2011-06-20 NOTE — Assessment & Plan Note (Signed)
Sig nocturia but okay in day No meds for now

## 2011-06-20 NOTE — Assessment & Plan Note (Signed)
BP Readings from Last 3 Encounters:  06/20/11 140/70  04/15/11 150/80  04/08/10 168/70   Better Tolerating meds

## 2011-06-20 NOTE — Progress Notes (Signed)
Subjective:    Patient ID: Devin Harmon, male    DOB: Apr 02, 1956, 55 y.o.   MRN: [id number]  HPI Doing well Able to tolerate the changed meds without any problems  Having rash on shoulders again when he gets hot Had cream in past from ?? that really helped  Discussed colonoscopy--he doesn't want this Discussed and he is willing to have the stool immunoassay  Kidney tests were better last time Had been big drinker in past ---not very much now (once or twice a week)  Current Outpatient Prescriptions on File Prior to Visit  Medication Sig Dispense Refill  . acetaminophen (TYLENOL) 500 MG tablet Take 1,000 mg by mouth 3 (three) times daily as needed. For hip pain       . amLODipine (NORVASC) 10 MG tablet TAKE 1 TABLET BY MOUTH TWICE A DAY AS NEEDED FOR HIGH BLOOD PRESSURE  60 tablet  0  . carvedilol (COREG) 25 MG tablet Take 1 tablet (25 mg total) by mouth 2 (two) times daily with a meal.  60 tablet  11    Allergies  Allergen Reactions  . Chlorthalidone     REACTION: felt "bad" and ED  . Sulfonamide Derivatives     REACTION: unspecified    Past Medical History  Diagnosis Date  . Allergy   . Hypertension   . Renal insufficiency   . BPH (benign prostatic hypertrophy)   . Zoster 2007    facial, hospital 2007    No past surgical history on file.  Family History  Problem Relation Age of Onset  . Hypertension Mother   . Hypertension Brother   . Coronary artery disease Neg Hx   . Diabetes Neg Hx   . Cancer Neg Hx     prostate or colon    History   Social History  . Marital Status: Married    Spouse Name: N/A    Number of Children: 2  . Years of Education: N/A   Occupational History  . Builder, building new homes    Social History Main Topics  . Smoking status: Never Smoker   . Smokeless tobacco: Never Used  . Alcohol Use: Yes     occasional  . Drug Use: Not on file  . Sexually Active: Not on file   Other Topics Concern  . Not on file   Social History  Narrative  . No narrative on file   Review of Systems  Constitutional: Negative for fatigue and unexpected weight change.       Wears seat belt Weight stable  HENT: Positive for congestion, rhinorrhea and dental problem. Negative for hearing loss and tinnitus.        Starting with new dentist---may need some pulled Gets some allergy problems---rarely uses OTC  Eyes: Negative for visual disturbance.       No  dipolpia or focal vision loss  Respiratory: Positive for cough. Negative for chest tightness and shortness of breath.        Occ cough with allergies  Cardiovascular: Negative for chest pain, palpitations and leg swelling.  Gastrointestinal: Negative for nausea, vomiting, abdominal pain, constipation and blood in stool.       No recent heartburn  Genitourinary: Positive for frequency. Negative for dysuria and difficulty urinating.       Nocturia is up to 4 times per night No ED with new BP meds  Musculoskeletal: Positive for back pain and arthralgias. Negative for joint swelling.       Occ back  and neck pain--relates some to past football injuries  Skin: Positive for rash.       No suspicious lesions   Neurological: Negative for dizziness, syncope, weakness, light-headedness and headaches.  Hematological: Negative for adenopathy. Does not bruise/bleed easily.  Psychiatric/Behavioral: Negative for sleep disturbance and dysphoric mood. The patient is not nervous/anxious.        Objective:   Physical Exam  Constitutional: He is oriented to person, place, and time. He appears well-developed and well-nourished. No distress.  HENT:  Head: Normocephalic and atraumatic.  Right Ear: External ear normal.  Left Ear: External ear normal.  Mouth/Throat: Oropharynx is clear and moist. No oropharyngeal exudate.       TMs normal  Eyes: Conjunctivae and EOM are normal. Pupils are equal, round, and reactive to light.       Fundi benign  Neck: Normal range of motion. Neck supple. No  thyromegaly present.  Cardiovascular: Normal rate, regular rhythm and intact distal pulses.  Exam reveals no gallop.   Murmur heard.      Soft systolic murmur at pulmonic area  Pulmonary/Chest: Effort normal and breath sounds normal. No respiratory distress. He has no wheezes. He has no rales.  Abdominal: Soft. He exhibits no mass. There is no tenderness.  Musculoskeletal: Normal range of motion. He exhibits no edema and no tenderness.  Lymphadenopathy:    He has no cervical adenopathy.  Neurological: He is alert and oriented to person, place, and time. He exhibits normal muscle tone.       Normal strength and gait  Skin: Skin is warm. Rash noted.       Non specific papular eruption on shoulders  Psychiatric: He has a normal mood and affect. His behavior is normal. Judgment and thought content normal.          Assessment & Plan:  doing

## 2011-06-20 NOTE — Assessment & Plan Note (Signed)
Creat 1.9-2.1 recently

## 2011-06-20 NOTE — Patient Instructions (Addendum)
Please do the stool test and send it in for evaluation

## 2011-06-20 NOTE — Assessment & Plan Note (Signed)
Generally healthy Discussed cancer screening--will do PSA and stool immunoassay

## 2011-06-21 LAB — BASIC METABOLIC PANEL
BUN: 24 mg/dL — ABNORMAL HIGH (ref 6–23)
CO2: 19 mEq/L (ref 19–32)
Chloride: 103 mEq/L (ref 96–112)
Creat: 2.04 mg/dL — ABNORMAL HIGH (ref 0.50–1.35)
Glucose, Bld: 100 mg/dL — ABNORMAL HIGH (ref 70–99)

## 2011-06-21 LAB — PSA: PSA: 0.89 ng/mL (ref <=4.00)

## 2011-06-26 ENCOUNTER — Other Ambulatory Visit: Payer: Managed Care, Other (non HMO)

## 2011-06-26 ENCOUNTER — Other Ambulatory Visit: Payer: Self-pay | Admitting: Internal Medicine

## 2011-06-26 DIAGNOSIS — Z1211 Encounter for screening for malignant neoplasm of colon: Secondary | ICD-10-CM

## 2011-06-30 ENCOUNTER — Encounter: Payer: Self-pay | Admitting: *Deleted

## 2011-08-12 ENCOUNTER — Other Ambulatory Visit: Payer: Self-pay | Admitting: *Deleted

## 2011-08-12 MED ORDER — AMLODIPINE BESYLATE 10 MG PO TABS: 10.0000 mg | ORAL_TABLET | Freq: Two times a day (BID) | ORAL | Status: DC | PRN

## 2011-08-12 NOTE — Telephone Encounter (Signed)
rx sent to pharmacy by e-script  

## 2012-05-04 ENCOUNTER — Other Ambulatory Visit: Payer: Self-pay | Admitting: *Deleted

## 2012-05-04 MED ORDER — CARVEDILOL 25 MG PO TABS: 25.0000 mg | ORAL_TABLET | Freq: Two times a day (BID) | ORAL | Status: DC

## 2012-08-29 ENCOUNTER — Other Ambulatory Visit: Payer: Self-pay | Admitting: Internal Medicine

## 2012-10-14 ENCOUNTER — Other Ambulatory Visit: Payer: Self-pay | Admitting: Internal Medicine

## 2012-10-14 ENCOUNTER — Other Ambulatory Visit: Payer: Self-pay | Admitting: *Deleted

## 2012-10-14 MED ORDER — CARVEDILOL 25 MG PO TABS: 25.0000 mg | ORAL_TABLET | Freq: Two times a day (BID) | ORAL | Status: DC

## 2012-10-14 NOTE — Telephone Encounter (Signed)
Refill denied last seen 06/2011, needs appt

## 2012-10-24 ENCOUNTER — Other Ambulatory Visit: Payer: Self-pay | Admitting: Internal Medicine

## 2012-10-26 ENCOUNTER — Other Ambulatory Visit: Payer: Self-pay | Admitting: *Deleted

## 2012-10-26 MED ORDER — CARVEDILOL 25 MG PO TABS: 25.0000 mg | ORAL_TABLET | Freq: Two times a day (BID) | ORAL | Status: DC

## 2012-10-26 MED ORDER — AMLODIPINE BESYLATE 10 MG PO TABS: 10.0000 mg | ORAL_TABLET | Freq: Two times a day (BID) | ORAL | Status: DC | PRN

## 2012-10-26 NOTE — Telephone Encounter (Signed)
Per Morey Hummingbird, patient scheduled appt for Monday 11/01/2012, gave #30 until his appt

## 2012-11-01 ENCOUNTER — Encounter: Payer: Self-pay | Admitting: Internal Medicine

## 2012-11-01 ENCOUNTER — Ambulatory Visit (INDEPENDENT_AMBULATORY_CARE_PROVIDER_SITE_OTHER): Payer: Managed Care, Other (non HMO) | Admitting: Internal Medicine

## 2012-11-01 VITALS — BP 148/78 | HR 52 | Temp 97.7°F | Wt 205.0 lb

## 2012-11-01 DIAGNOSIS — N400 Enlarged prostate without lower urinary tract symptoms: Secondary | ICD-10-CM

## 2012-11-01 DIAGNOSIS — I10 Essential (primary) hypertension: Secondary | ICD-10-CM

## 2012-11-01 DIAGNOSIS — M722 Plantar fascial fibromatosis: Secondary | ICD-10-CM | POA: Insufficient documentation

## 2012-11-01 LAB — BASIC METABOLIC PANEL
Chloride: 101 mEq/L (ref 96–112)
Potassium: 4.2 mEq/L (ref 3.5–5.1)

## 2012-11-01 MED ORDER — TRIAMCINOLONE ACETONIDE 0.1 % EX CREA: TOPICAL_CREAM | Freq: Two times a day (BID) | CUTANEOUS | Status: DC | PRN

## 2012-11-01 NOTE — Assessment & Plan Note (Signed)
Discussed proper arch support--he has them but doesn't put them in his boots that he wears

## 2012-11-01 NOTE — Patient Instructions (Signed)
Please try drinking less water after supper. If you continue to get up too often at night, call for prescription for enlarged prostate medication

## 2012-11-01 NOTE — Progress Notes (Signed)
  Subjective:    Patient ID: Devin Harmon, male    DOB: 1956-02-28, 56 y.o.   MRN: [id number]  HPI Here for follow up  Had rash at last visit I forgot to send cream for him---I did it now  Having pain on outside of left foot--actually on heel Started about 2 months ago May have started after jumping down off roof and coming down hard Mostly notes at the end of the day Has to use padding then Considering seeing podiatrist No rash  No chest pain No SOB No edema No dizziness or syncope  Voids slowlly Takes a while to empty his bladder Nocturia x 5 now He drinks a lot of water in the evening No daytime urgency or frequency  Current Outpatient Prescriptions on File Prior to Visit  Medication Sig Dispense Refill  . [DISCONTINUED] amLODipine (NORVASC) 10 MG tablet Take 1 tablet (10 mg total) by mouth 2 (two) times daily as needed.  30 tablet  0  . [DISCONTINUED] carvedilol (COREG) 25 MG tablet Take 1 tablet (25 mg total) by mouth 2 (two) times daily with a meal. * Needs appointment with Dr for any additional refills*  30 tablet  0    Allergies  Allergen Reactions  . Chlorthalidone     REACTION: felt "bad" and ED  . Sulfonamide Derivatives     REACTION: unspecified    Past Medical History  Diagnosis Date  . Allergy   . Hypertension   . Renal insufficiency   . BPH (benign prostatic hypertrophy)   . Zoster 2007    facial, hospital 2007    No past surgical history on file.  Family History  Problem Relation Age of Onset  . Hypertension Mother   . Hypertension Brother   . Coronary artery disease Neg Hx   . Diabetes Neg Hx   . Cancer Neg Hx     prostate or colon    History   Social History  . Marital Status: Married    Spouse Name: N/A    Number of Children: 2  . Years of Education: N/A   Occupational History  . Builder, building new homes    Social History Main Topics  . Smoking status: Never Smoker   . Smokeless tobacco: Never Used  . Alcohol Use:  Yes     Comment: occasional  . Drug Use: Not on file  . Sexually Active: Not on file   Other Topics Concern  . Not on file   Social History Narrative  . No narrative on file   Review of Systems Does have some distrubance of sleep due to the nocturia Appetite is fine Weight is about stable     Objective:   Physical Exam  Constitutional: He appears well-developed and well-nourished. No distress.  Neck: Normal range of motion. Neck supple.  Cardiovascular: Normal rate, regular rhythm and normal heart sounds.  Exam reveals no gallop.   No murmur heard. Pulmonary/Chest: Effort normal and breath sounds normal. No respiratory distress. He has no wheezes. He has no rales.  Musculoskeletal: He exhibits no edema.       High arches  Lymphadenopathy:    He has no cervical adenopathy.  Psychiatric: He has a normal mood and affect. His behavior is normal.          Assessment & Plan:

## 2012-11-01 NOTE — Assessment & Plan Note (Signed)
Sig nocturia but no daytime issues Will have him cut back on water at night Start tamsulosin if persists

## 2012-11-01 NOTE — Assessment & Plan Note (Signed)
BP Readings from Last 3 Encounters:  11/01/12 148/78  06/20/11 140/70  04/15/11 150/80   Discussed lifestyle issues as he doesn't want more meds Will work on decreasing sugared drinks

## 2012-11-02 ENCOUNTER — Encounter: Payer: Self-pay | Admitting: *Deleted

## 2012-11-16 ENCOUNTER — Other Ambulatory Visit: Payer: Self-pay | Admitting: Internal Medicine

## 2012-11-16 ENCOUNTER — Telehealth: Payer: Self-pay | Admitting: Family Medicine

## 2012-11-16 MED ORDER — AMLODIPINE BESYLATE 10 MG PO TABS: ORAL_TABLET | ORAL | Status: DC

## 2012-11-16 NOTE — Telephone Encounter (Signed)
Yes, change to #60 x 11

## 2012-11-16 NOTE — Telephone Encounter (Signed)
rx sent to pharmacy by e-script  

## 2012-11-16 NOTE — Telephone Encounter (Signed)
[facility] called regarding amlodipine rx called in yesterday.  RX was called in for instructions to take 1 tablet twice a day with #30 (11 refills) only giving him a half month dose.  Ok to change to #60?  Please advise.

## 2012-12-18 ENCOUNTER — Other Ambulatory Visit: Payer: Self-pay | Admitting: Internal Medicine

## 2012-12-20 ENCOUNTER — Other Ambulatory Visit: Payer: Self-pay

## 2012-12-20 MED ORDER — CARVEDILOL 25 MG PO TABS: 25.0000 mg | ORAL_TABLET | Freq: Two times a day (BID) | ORAL | Status: DC

## 2012-12-20 NOTE — Telephone Encounter (Signed)
[facility] got refill for # 30 and request one month rx;changed to # 60.

## 2013-05-03 ENCOUNTER — Encounter: Payer: Self-pay | Admitting: Internal Medicine

## 2013-05-03 ENCOUNTER — Ambulatory Visit (INDEPENDENT_AMBULATORY_CARE_PROVIDER_SITE_OTHER): Payer: Managed Care, Other (non HMO) | Admitting: Internal Medicine

## 2013-05-03 VITALS — BP 120/70 | HR 52 | Temp 97.7°F | Ht 68.0 in | Wt 203.0 lb

## 2013-05-03 DIAGNOSIS — I10 Essential (primary) hypertension: Secondary | ICD-10-CM

## 2013-05-03 DIAGNOSIS — Z1211 Encounter for screening for malignant neoplasm of colon: Secondary | ICD-10-CM

## 2013-05-03 DIAGNOSIS — N400 Enlarged prostate without lower urinary tract symptoms: Secondary | ICD-10-CM

## 2013-05-03 DIAGNOSIS — Z0000 Encounter for general adult medical examination without abnormal findings: Secondary | ICD-10-CM

## 2013-05-03 LAB — HEPATIC FUNCTION PANEL
ALT: 13 U/L (ref 0–53)
AST: 15 U/L (ref 0–37)
Albumin: 4 g/dL (ref 3.5–5.2)
Alkaline Phosphatase: 70 U/L (ref 39–117)
Total Protein: 7.2 g/dL (ref 6.0–8.3)

## 2013-05-03 LAB — CBC WITH DIFFERENTIAL/PLATELET
Basophils Absolute: 0 10*3/uL (ref 0.0–0.1)
Basophils Relative: 0.5 % (ref 0.0–3.0)
HCT: 43.7 % (ref 39.0–52.0)
Hemoglobin: 14.8 g/dL (ref 13.0–17.0)
Lymphs Abs: 2.4 10*3/uL (ref 0.7–4.0)
Monocytes Relative: 7.7 % (ref 3.0–12.0)
Neutro Abs: 5.6 10*3/uL (ref 1.4–7.7)
RDW: 13.5 % (ref 11.5–14.6)

## 2013-05-03 LAB — BASIC METABOLIC PANEL
CO2: 24 mEq/L (ref 19–32)
GFR: 46.51 mL/min — ABNORMAL LOW (ref >60.00)
Glucose, Bld: 92 mg/dL (ref 70–99)
Potassium: 4.6 mEq/L (ref 3.5–5.1)
Sodium: 137 mEq/L (ref 135–145)

## 2013-05-03 MED ORDER — TADALAFIL 5 MG PO TABS: 5.0000 mg | ORAL_TABLET | Freq: Every day | ORAL | Status: DC | PRN

## 2013-05-03 NOTE — Patient Instructions (Signed)
Please call if you are not satisfied with how the cialis helps your prostate enlargement (better urine flow and less times up at night). I can try another medicine  DASH Diet The DASH diet stands for "Dietary Approaches to Stop Hypertension." It is a healthy eating plan that has been shown to reduce high blood pressure (hypertension) in as little as 14 days, while also possibly providing other significant health benefits. These other health benefits include reducing the risk of breast cancer after menopause and reducing the risk of type 2 diabetes, heart disease, colon cancer, and stroke. Health benefits also include weight loss and slowing kidney failure in patients with chronic kidney disease.  DIET GUIDELINES  Limit salt (sodium). Your diet should contain less than 1500 mg of sodium daily.  Limit refined or processed carbohydrates. Your diet should include mostly whole grains. Desserts and added sugars should be used sparingly.  Include small amounts of heart-healthy fats. These types of fats include nuts, oils, and tub margarine. Limit saturated and trans fats. These fats have been shown to be harmful in the body. CHOOSING FOODS  The following food groups are based on a 2000 calorie diet. See your Registered Dietitian for individual calorie needs. Grains and Grain Products (6 to 8 servings daily)  Eat More Often: Whole-wheat bread, brown rice, whole-grain or wheat pasta, quinoa, popcorn without added fat or salt (air popped).  Eat Less Often: White bread, white pasta, white rice, cornbread. Vegetables (4 to 5 servings daily)  Eat More Often: Fresh, frozen, and canned vegetables. Vegetables may be raw, steamed, roasted, or grilled with a minimal amount of fat.  Eat Less Often/Avoid: Creamed or fried vegetables. Vegetables in a cheese sauce. Fruit (4 to 5 servings daily)  Eat More Often: All fresh, canned (in natural juice), or frozen fruits. Dried fruits without added sugar. One hundred  percent fruit juice ( cup [237 mL] daily).  Eat Less Often: Dried fruits with added sugar. Canned fruit in light or heavy syrup. [employer], Fish, and Poultry (2 servings or less daily. One serving is 3 to 4 oz [85-114 g]).  Eat More Often: Ninety percent or leaner ground beef, tenderloin, sirloin. Round cuts of beef, chicken breast, [place] breast. All fish. Grill, bake, or broil your meat. Nothing should be fried.  Eat Less Often/Avoid: Fatty cuts of meat, [place], or chicken leg, thigh, or wing. Fried cuts of meat or fish. Dairy (2 to 3 servings)  Eat More Often: Low-fat or fat-free milk, low-fat plain or light yogurt, reduced-fat or part-skim cheese.  Eat Less Often/Avoid: Milk (whole, 2%).Whole milk yogurt. Full-fat cheeses. Nuts, Seeds, and Legumes (4 to 5 servings per week)  Eat More Often: All without added salt.  Eat Less Often/Avoid: Salted nuts and seeds, canned beans with added salt. Fats and Sweets (limited)  Eat More Often: Vegetable oils, tub margarines without trans fats, sugar-free gelatin. Mayonnaise and salad dressings.  Eat Less Often/Avoid: Coconut oils, palm oils, butter, stick margarine, cream, half and half, cookies, candy, pie. FOR MORE INFORMATION The Dash Diet Eating Plan: www.dashdiet.org Document Released: 11/06/2011 Document Revised: 02/09/2012 Document Reviewed: 11/06/2011 [facility] Patient Information 2014 [place], [place].

## 2013-05-03 NOTE — Assessment & Plan Note (Signed)
Much worse Will try every day cialis

## 2013-05-03 NOTE — Progress Notes (Signed)
Subjective:    Patient ID: Devin Harmon, male    DOB: 1956/08/20, 57 y.o.   MRN: [id number]  HPI Here for physical  Has been concerned about urine flow Slow stream Nocturia x 5! Having sexual problems-- interested in meds for this  No other new concerns  Current Outpatient Prescriptions on File Prior to Visit  Medication Sig Dispense Refill  . amLODipine (NORVASC) 10 MG tablet TAKE 1 TABLET TWICE A DAY  60 tablet  11  . carvedilol (COREG) 25 MG tablet Take 1 tablet (25 mg total) by mouth 2 (two) times daily with a meal.  60 tablet  11   No current facility-administered medications on file prior to visit.    Allergies  Allergen Reactions  . Chlorthalidone     REACTION: felt "bad" and ED  . Sulfonamide Derivatives     REACTION: unspecified    Past Medical History  Diagnosis Date  . Allergy   . Hypertension   . Renal insufficiency   . BPH (benign prostatic hypertrophy)   . Zoster 2007    facial, hospital 2007    No past surgical history on file.  Family History  Problem Relation Age of Onset  . Hypertension Mother   . Hypertension Brother   . Coronary artery disease Neg Hx   . Diabetes Neg Hx   . Cancer Neg Hx     prostate or colon    History   Social History  . Marital Status: Married    Spouse Name: N/A    Number of Children: 2  . Years of Education: N/A   Occupational History  . Builder, building new homes    Social History Main Topics  . Smoking status: Never Smoker   . Smokeless tobacco: Never Used  . Alcohol Use: Yes     Comment: occasional  . Drug Use: Not on file  . Sexually Active: Not on file   Other Topics Concern  . Not on file   Social History Narrative  . No narrative on file   Review of Systems  Constitutional: Negative for fatigue and unexpected weight change.       Wears seat belt  HENT: Positive for rhinorrhea and dental problem. Negative for hearing loss, congestion and tinnitus.        Doesn't go to the dentist   Eyes: Negative for visual disturbance.       No diplopia or unilateral vision loss  Respiratory: Negative for cough, chest tightness and shortness of breath.   Cardiovascular: Negative for chest pain, palpitations and leg swelling.  Gastrointestinal: Negative for nausea, vomiting, abdominal pain, constipation and blood in stool.       Occ heartburn if dietary indiscretion-- uses OTC med if needed (uses it before hamburgers, etc)  Endocrine: Negative for cold intolerance and heat intolerance.  Genitourinary: Positive for frequency, enuresis and difficulty urinating.  Musculoskeletal: Positive for arthralgias. Negative for back pain and joint swelling.       Chronic neck pain--no meds now  Skin: Positive for rash.       Cream is not quite as effective --does help but he needs it more  Allergic/Immunologic: Positive for environmental allergies. Negative for immunocompromised state.       Doesn't use meds for this  Neurological: Negative for dizziness, syncope, weakness, light-headedness, numbness and headaches.  Hematological: Negative for adenopathy. Does not bruise/bleed easily.  Psychiatric/Behavioral: Positive for sleep disturbance. Negative for dysphoric mood. The patient is not nervous/anxious.  Chronic sleep problems but not tired in the day Does have some problems focusing at times       Objective:   Physical Exam  Constitutional: He is oriented to person, place, and time. He appears well-developed and well-nourished. No distress.  HENT:  Head: Normocephalic and atraumatic.  Right Ear: External ear normal.  Left Ear: External ear normal.  Mouth/Throat: Oropharynx is clear and moist. No oropharyngeal exudate.  Eyes: Conjunctivae and EOM are normal. Pupils are equal, round, and reactive to light.  Neck: Normal range of motion. Neck supple. No thyromegaly present.  Cardiovascular: Normal rate, regular rhythm, normal heart sounds and intact distal pulses.  Exam reveals no  gallop.   No murmur heard. Pulmonary/Chest: Effort normal and breath sounds normal. No respiratory distress. He has no wheezes. He has no rales.  Abdominal: Soft. There is no tenderness.  Musculoskeletal: He exhibits no edema and no tenderness.  Lymphadenopathy:    He has no cervical adenopathy.  Neurological: He is alert and oriented to person, place, and time.  Skin: No rash noted. No erythema.  Psychiatric: He has a normal mood and affect. His behavior is normal.          Assessment & Plan:

## 2013-05-03 NOTE — Assessment & Plan Note (Signed)
Fairly healthy Counseled on proper eating Will check PSA Fecal immunoassay

## 2013-05-03 NOTE — Assessment & Plan Note (Signed)
BP Readings from Last 3 Encounters:  05/03/13 120/70  11/01/12 148/78  06/20/11 140/70   Good control Due for labs

## 2013-05-05 ENCOUNTER — Encounter: Payer: Self-pay | Admitting: *Deleted

## 2013-05-12 ENCOUNTER — Other Ambulatory Visit: Payer: Managed Care, Other (non HMO)

## 2013-05-12 DIAGNOSIS — Z1211 Encounter for screening for malignant neoplasm of colon: Secondary | ICD-10-CM

## 2013-05-13 ENCOUNTER — Encounter: Payer: Self-pay | Admitting: *Deleted

## 2013-11-30 ENCOUNTER — Other Ambulatory Visit: Payer: Self-pay | Admitting: Internal Medicine

## 2014-01-18 ENCOUNTER — Other Ambulatory Visit: Payer: Self-pay | Admitting: Internal Medicine

## 2014-05-05 ENCOUNTER — Encounter: Payer: Self-pay | Admitting: Internal Medicine

## 2014-05-05 ENCOUNTER — Ambulatory Visit (INDEPENDENT_AMBULATORY_CARE_PROVIDER_SITE_OTHER): Payer: 59 | Admitting: Internal Medicine

## 2014-05-05 VITALS — BP 138/70 | HR 58 | Temp 98.0°F | Ht 68.0 in | Wt 203.0 lb

## 2014-05-05 DIAGNOSIS — Z0000 Encounter for general adult medical examination without abnormal findings: Secondary | ICD-10-CM

## 2014-05-05 DIAGNOSIS — N183 Chronic kidney disease, stage 3 unspecified: Secondary | ICD-10-CM

## 2014-05-05 DIAGNOSIS — I10 Essential (primary) hypertension: Secondary | ICD-10-CM

## 2014-05-05 DIAGNOSIS — Z1211 Encounter for screening for malignant neoplasm of colon: Secondary | ICD-10-CM

## 2014-05-05 LAB — CBC WITH DIFFERENTIAL/PLATELET
Basophils Absolute: 0.1 10*3/uL (ref 0.0–0.1)
Basophils Relative: 0.7 % (ref 0.0–3.0)
EOS PCT: 3.1 % (ref 0.0–5.0)
Eosinophils Absolute: 0.3 10*3/uL (ref 0.0–0.7)
HEMATOCRIT: 45 % (ref 39.0–52.0)
HEMOGLOBIN: 14.8 g/dL (ref 13.0–17.0)
LYMPHS ABS: 2.2 10*3/uL (ref 0.7–4.0)
Lymphocytes Relative: 24.2 % (ref 12.0–46.0)
MCHC: 32.9 g/dL (ref 30.0–36.0)
MCV: 83.9 fl (ref 78.0–100.0)
Monocytes Absolute: 0.7 10*3/uL (ref 0.1–1.0)
Monocytes Relative: 7.6 % (ref 3.0–12.0)
NEUTROS ABS: 6 10*3/uL (ref 1.4–7.7)
Neutrophils Relative %: 64.4 % (ref 43.0–77.0)
Platelets: 341 10*3/uL (ref 150.0–400.0)
RBC: 5.36 Mil/uL (ref 4.22–5.81)
RDW: 13.7 % (ref 11.5–15.5)
WBC: 9.3 10*3/uL (ref 4.0–10.5)

## 2014-05-05 LAB — COMPREHENSIVE METABOLIC PANEL
ALK PHOS: 65 U/L (ref 39–117)
ALT: 13 U/L (ref 0–53)
AST: 15 U/L (ref 0–37)
Albumin: 4.2 g/dL (ref 3.5–5.2)
BILIRUBIN TOTAL: 0.3 mg/dL (ref 0.2–1.2)
BUN: 24 mg/dL — ABNORMAL HIGH (ref 6–23)
CO2: 27 meq/L (ref 19–32)
CREATININE: 2.3 mg/dL — AB (ref 0.4–1.5)
Calcium: 9.3 mg/dL (ref 8.4–10.5)
Chloride: 101 mEq/L (ref 96–112)
GFR: 38.4 mL/min — AB (ref >60.00)
GLUCOSE: 95 mg/dL (ref 70–99)
Potassium: 4.6 mEq/L (ref 3.5–5.1)
Sodium: 134 mEq/L — ABNORMAL LOW (ref 135–145)
Total Protein: 7.7 g/dL (ref 6.0–8.3)

## 2014-05-05 LAB — LIPID PANEL
CHOL/HDL RATIO: 4
Cholesterol: 180 mg/dL (ref 0–200)
HDL: 41.4 mg/dL (ref >39.00)
LDL CALC: 105 mg/dL — AB (ref 0–99)
NONHDL: 138.6
Triglycerides: 168 mg/dL — ABNORMAL HIGH (ref 0.0–149.0)
VLDL: 33.6 mg/dL (ref 0.0–40.0)

## 2014-05-05 LAB — T4, FREE: Free T4: 0.69 ng/dL (ref 0.60–1.60)

## 2014-05-05 LAB — TSH: TSH: 1.11 u[IU]/mL (ref 0.35–4.50)

## 2014-05-05 MED ORDER — TRIAMCINOLONE ACETONIDE 0.1 % EX CREA: TOPICAL_CREAM | Freq: Two times a day (BID) | CUTANEOUS | Status: DC | PRN

## 2014-05-05 MED ORDER — TADALAFIL 5 MG PO TABS: 5.0000 mg | ORAL_TABLET | Freq: Every day | ORAL | Status: DC | PRN

## 2014-05-05 NOTE — Assessment & Plan Note (Signed)
Has been stable I believe he was intolerant of lisinopril but that was years ago Does have known proteinuria  Would like to  try adding losartan in low dose-- he is not willing to take another med He doesn't want any change If any worsening, will send him to nephrology

## 2014-05-05 NOTE — Assessment & Plan Note (Signed)
BP Readings from Last 3 Encounters:  05/05/14 138/70  05/03/13 120/70  11/01/12 148/78   Good control

## 2014-05-05 NOTE — Patient Instructions (Signed)
Please get the generic of zantac (ranitidine 150mg ) at [facility]. Take this when you get acid problems. If you need it at least twice a week, you should consider taking it every night at bedtime.

## 2014-05-05 NOTE — Progress Notes (Signed)
Subjective:    Patient ID: Devin Harmon, male    DOB: [birth year], 58 y.o.   MRN: [id number]  HPI Here for physical Feels good  cialis has been really helpful Has worked for ED and now voids much better  No set exercise but active at work Weight stable Same job  No other concerns  Current Outpatient Prescriptions on File Prior to Visit  Medication Sig Dispense Refill  . amLODipine (NORVASC) 10 MG tablet TAKE 1 TABLET TWICE A DAY  60 tablet  9  . carvedilol (COREG) 25 MG tablet TAKE 1 TABLET BY MOUTH 2 TIMES DAILY WITH A MEAL.  60 tablet  9   No current facility-administered medications on file prior to visit.    Allergies  Allergen Reactions  . Chlorthalidone     REACTION: felt "bad" and ED  . Sulfonamide Derivatives     REACTION: unspecified    Past Medical History  Diagnosis Date  . Allergy   . Hypertension   . Renal insufficiency   . BPH (benign prostatic hypertrophy)   . Zoster 2007    facial, hospital 2007    No past surgical history on file.  Family History  Problem Relation Age of Onset  . Hypertension Mother   . Cancer Mother     lung cancer  . Hypertension Brother   . Coronary artery disease Neg Hx   . Diabetes Neg Hx   . Heart disease Father     History   Social History  . Marital Status: Married    Spouse Name: N/A    Number of Children: 2  . Years of Education: N/A   Occupational History  . Builder, building new homes    Social History Main Topics  . Smoking status: Never Smoker   . Smokeless tobacco: Never Used  . Alcohol Use: Yes     Comment: occasional  . Drug Use: Not on file  . Sexual Activity: Not on file   Other Topics Concern  . Not on file   Social History Narrative  . No narrative on file   Review of Systems  Constitutional: Negative for fatigue and unexpected weight change.       Wears seat belt  HENT: Positive for dental problem. Negative for hearing loss and tinnitus.        Needs implants--trying to save  up for this  Eyes: Negative for visual disturbance.       No diplopia or unilateral vision loss  Respiratory: Negative for cough, chest tightness and shortness of breath.   Cardiovascular: Positive for leg swelling. Negative for chest pain and palpitations.       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       Gets heartburn ~twice a week. Tums or alka seltzer Ranitidine helps but expensive as zantac  Endocrine: Negative for cold intolerance and heat intolerance.  Genitourinary: Negative for urgency, frequency and difficulty urinating.  Musculoskeletal: Positive for arthralgias. Negative for back pain and joint swelling.       Chronic shoulder pain  Skin: Positive for rash.       Gets rash on neck--cortisone cream helps (refill done)  Allergic/Immunologic: Negative for environmental allergies and immunocompromised state.  Neurological: Negative for dizziness, syncope, weakness, light-headedness, numbness and headaches.  Hematological: Negative for adenopathy. Does not bruise/bleed easily.  Psychiatric/Behavioral: Negative for sleep disturbance and dysphoric mood.  The patient is not nervous/anxious.        Wife thinks he gets ill at times       Objective:   Physical Exam  Constitutional: He is oriented to person, place, and time. He appears well-developed and well-nourished. No distress.  HENT:  Head: Normocephalic and atraumatic.  Right Ear: External ear normal.  Left Ear: External ear normal.  Mouth/Throat: Oropharynx is clear and moist. No oropharyngeal exudate.  Eyes: Conjunctivae and EOM are normal. Pupils are equal, round, and reactive to light.  Neck: Normal range of motion. Neck supple. No thyromegaly present.  Cardiovascular: Normal rate, regular rhythm, normal heart sounds and intact distal pulses.  Exam reveals no gallop.   No murmur  heard. Pulmonary/Chest: Effort normal and breath sounds normal. No respiratory distress. He has no wheezes. He has no rales.  Abdominal: Soft. There is no tenderness.  Musculoskeletal: He exhibits no edema and no tenderness.  Lymphadenopathy:    He has no cervical adenopathy.  Neurological: He is alert and oriented to person, place, and time.  Skin: No rash noted. No erythema.  Psychiatric: He has a normal mood and affect. His behavior is normal.          Assessment & Plan:

## 2014-05-05 NOTE — Assessment & Plan Note (Signed)
Healthy Discussed fitness Will check fecal immunoassay again PSA next year

## 2014-05-05 NOTE — Progress Notes (Signed)
Pre visit review using our clinic review tool, if applicable. No additional management support is needed unless otherwise documented below in the visit note. 

## 2014-05-08 ENCOUNTER — Telehealth: Payer: Self-pay | Admitting: Internal Medicine

## 2014-05-08 NOTE — Telephone Encounter (Signed)
Relevant patient education mailed to patient.  

## 2014-05-09 ENCOUNTER — Encounter: Payer: Self-pay | Admitting: *Deleted

## 2014-06-22 ENCOUNTER — Telehealth: Payer: Self-pay | Admitting: Internal Medicine

## 2014-06-22 MED ORDER — TRIAMCINOLONE ACETONIDE 0.1 % EX CREA: TOPICAL_CREAM | Freq: Two times a day (BID) | CUTANEOUS | Status: DC | PRN

## 2014-06-22 NOTE — Telephone Encounter (Signed)
rx sent to pharmacy by e-script Spoke with patient's wife and advised results

## 2014-06-22 NOTE — Telephone Encounter (Signed)
Sometime last year pt had been prescribed a cream (Triamcinolone acetonide cream) for a rash on his neck. The rash is back and pt would like another rx. Please advise. [facility]  [phone]

## 2014-06-22 NOTE — Telephone Encounter (Signed)
Okay to send #45 gm x 0 refills

## 2014-08-08 ENCOUNTER — Encounter: Payer: Self-pay | Admitting: Internal Medicine

## 2014-08-08 ENCOUNTER — Ambulatory Visit (INDEPENDENT_AMBULATORY_CARE_PROVIDER_SITE_OTHER): Payer: 59 | Admitting: Internal Medicine

## 2014-08-08 VITALS — BP 120/70 | HR 62 | Temp 97.9°F | Wt 200.0 lb

## 2014-08-08 DIAGNOSIS — R109 Unspecified abdominal pain: Secondary | ICD-10-CM

## 2014-08-08 DIAGNOSIS — N183 Chronic kidney disease, stage 3 unspecified: Secondary | ICD-10-CM

## 2014-08-08 MED ORDER — TRAMADOL HCL 50 MG PO TABS
50.0000 mg | ORAL_TABLET | Freq: Three times a day (TID) | ORAL | Status: DC | PRN
Start: 2014-08-08 — End: 2015-05-09

## 2014-08-08 NOTE — Assessment & Plan Note (Signed)
Now down to 3b Urged him to go to nephrologist Will set up referral

## 2014-08-08 NOTE — Progress Notes (Signed)
   Subjective:    Patient ID: Devin Harmon, male    DOB: Jan 20, 1956, 58 y.o.   MRN: [id number]  HPI Thinks he has a bladder infection Left low back pain-- bad if he is walking much. Started 3 days ago Tylenol not helping His brother had similar pain-- and was told he had bladder infection  No dysuria Does have 5 times a night nocturia--no change Empties okay No blood in urine No sexual problems-- last about a week ago No infidelity  [place] started  Now having some pain into the left leg--along the side No leg weakness but it quivers at times No numbness or tingling in the leg Pain if turning to the right in bed  Current Outpatient Prescriptions on File Prior to Visit  Medication Sig Dispense Refill  . amLODipine (NORVASC) 10 MG tablet TAKE 1 TABLET TWICE A DAY  60 tablet  9  . carvedilol (COREG) 25 MG tablet TAKE 1 TABLET BY MOUTH 2 TIMES DAILY WITH A MEAL.  60 tablet  9  . ranitidine (ZANTAC) 150 MG tablet Take 150 mg by mouth 2 (two) times daily as needed for heartburn.      . tadalafil (CIALIS) 5 MG tablet Take 1 tablet (5 mg total) by mouth daily as needed for erectile dysfunction.  30 tablet  11  . triamcinolone cream (KENALOG) 0.1 % Apply topically 2 (two) times daily as needed. For rash  45 g  2   No current facility-administered medications on file prior to visit.    Allergies  Allergen Reactions  . Chlorthalidone     REACTION: felt "bad" and ED  . Sulfonamide Derivatives     REACTION: unspecified    Past Medical History  Diagnosis Date  . Allergy   . Hypertension   . Renal insufficiency   . BPH (benign prostatic hypertrophy)   . Zoster 2007    facial, hospital 2007    No past surgical history on file.  Family History  Problem Relation Age of Onset  . Hypertension Mother   . Cancer Mother     lung cancer  . Hypertension Brother   . Coronary artery disease Neg Hx   . Diabetes Neg Hx   .  Heart disease Father     History   Social History  . Marital Status: Married    Spouse Name: N/A    Number of Children: 2  . Years of Education: N/A   Occupational History  . Builder, building new homes    Social History Main Topics  . Smoking status: Never Smoker   . Smokeless tobacco: Never Used  . Alcohol Use: Yes     Comment: occasional  . Drug Use: Not on file  . Sexual Activity: Not on file   Other Topics Concern  . Not on file   Social History Narrative  . No narrative on file   Review of Systems No constipation No loss of bowel control No fever     Objective:   Physical Exam  Constitutional: He appears well-developed and well-nourished. No distress.  Musculoskeletal:  No spine tenderness No trouble with left hip ROM Pain along left flank at base of ribs but not really tender  Neurological:  Gait normal Slight weakness in left hip extenders--due to pain          Assessment & Plan:

## 2014-08-08 NOTE — Assessment & Plan Note (Signed)
Nothing to suggest this is related to urinary system--discussed this Clearly seems to be muscular strain---discussed heat, rest No NSAIDs--again made this clear with his CRF Will also give some tramadol for prn

## 2014-08-08 NOTE — Progress Notes (Signed)
Pre visit review using our clinic review tool, if applicable. No additional management support is needed unless otherwise documented below in the visit note. 

## 2014-08-14 ENCOUNTER — Telehealth: Payer: Self-pay

## 2014-08-14 MED ORDER — HYDROCODONE-ACETAMINOPHEN 5-325 MG PO TABS: 1.0000 | ORAL_TABLET | Freq: Four times a day (QID) | ORAL | Status: DC | PRN

## 2014-08-14 NOTE — Telephone Encounter (Signed)
Spoke with patient's wife and advised results  

## 2014-08-14 NOTE — Telephone Encounter (Signed)
Will give Rx for hydrocodone If persists, will need reevaluation (may want to set up with Dr Lorelei Pont)

## 2014-08-14 NOTE — Telephone Encounter (Signed)
Mrs Petrenko left v/m; pt was seen on 08/08/14 and pt continuing with back pain and request med stronger than tramadol and tylenol. Mrs Craver request cb.

## 2014-08-15 NOTE — Telephone Encounter (Signed)
Spoke with patient's wife and advised results  

## 2014-08-15 NOTE — Telephone Encounter (Addendum)
[name] left v/m;Hydrocodone was too strong for pt; caused pt to feel nauseated and dizzy. [name] request different med for pain that is not as strong as hydrocodone. [name] request cb.[facility]

## 2014-08-15 NOTE — Telephone Encounter (Signed)
There is not much choice between tramadol and hydrocodone. He needs to either try 1/2 of the hydrocodone at a time, or increase the tramadol to 2 tabs at a time---up to tid

## 2014-09-19 ENCOUNTER — Ambulatory Visit: Payer: Self-pay | Admitting: Nephrology

## 2014-09-26 ENCOUNTER — Telehealth: Payer: Self-pay | Admitting: Internal Medicine

## 2014-09-26 NOTE — Telephone Encounter (Signed)
Spouse dropped off health provider screening form Put on dee's desk

## 2014-09-26 NOTE — Telephone Encounter (Signed)
Form on your desk  

## 2014-09-26 NOTE — Telephone Encounter (Signed)
Done No charge 

## 2014-09-26 NOTE — Telephone Encounter (Signed)
I notified patient's wife form was faxed to [employer].

## 2014-12-04 ENCOUNTER — Other Ambulatory Visit: Payer: Self-pay | Admitting: Internal Medicine

## 2015-01-13 ENCOUNTER — Other Ambulatory Visit: Payer: Self-pay | Admitting: Internal Medicine

## 2015-01-15 ENCOUNTER — Other Ambulatory Visit: Payer: Self-pay | Admitting: *Deleted

## 2015-01-15 NOTE — Telephone Encounter (Signed)
Received refill request electronically from pharmacy. Last refill 06/22/14 #45 G/2 refills, last office visit 08/08/14. Is it okay to refill medication?

## 2015-01-15 NOTE — Telephone Encounter (Signed)
Approved: okay #45 gm x 2

## 2015-01-15 NOTE — Telephone Encounter (Signed)
triamcinolone cream (KENALOG) 0.1 % [facility] has instructed on previous refill note.

## 2015-02-08 ENCOUNTER — Other Ambulatory Visit: Payer: Self-pay | Admitting: Internal Medicine

## 2015-02-18 ENCOUNTER — Other Ambulatory Visit: Payer: Self-pay | Admitting: Internal Medicine

## 2015-05-09 ENCOUNTER — Ambulatory Visit (INDEPENDENT_AMBULATORY_CARE_PROVIDER_SITE_OTHER): Payer: 59 | Admitting: Internal Medicine

## 2015-05-09 ENCOUNTER — Encounter: Payer: Self-pay | Admitting: Internal Medicine

## 2015-05-09 VITALS — BP 110/60 | HR 57 | Temp 98.2°F | Wt 199.0 lb

## 2015-05-09 DIAGNOSIS — Z0000 Encounter for general adult medical examination without abnormal findings: Secondary | ICD-10-CM

## 2015-05-09 DIAGNOSIS — Z1211 Encounter for screening for malignant neoplasm of colon: Secondary | ICD-10-CM | POA: Diagnosis not present

## 2015-05-09 DIAGNOSIS — Z125 Encounter for screening for malignant neoplasm of prostate: Secondary | ICD-10-CM | POA: Diagnosis not present

## 2015-05-09 DIAGNOSIS — N183 Chronic kidney disease, stage 3 unspecified: Secondary | ICD-10-CM

## 2015-05-09 DIAGNOSIS — I10 Essential (primary) hypertension: Secondary | ICD-10-CM | POA: Diagnosis not present

## 2015-05-09 MED ORDER — LISINOPRIL 5 MG PO TABS: 5.0000 mg | ORAL_TABLET | Freq: Every day | ORAL | Status: DC

## 2015-05-09 NOTE — Progress Notes (Signed)
Subjective:    Patient ID: Devin Harmon, male    DOB: 06-06-1956, 59 y.o.   MRN: [id number]  HPI Here for physical  Still has rash around neck TAC not really helping Wonders if his meds causing the problem--never had these problems before taking the meds Flares with worsened itching about once a week Uses other OTC creams--does give some relief  Went to nephrologist States his tests were all fine--I never got report Given enalapril in low dose but didn't take it after a couple of weeks  Flank pain is gone Occasional pain in knee--relates to strain at work. This is also better  Current Outpatient Prescriptions on File Prior to Visit  Medication Sig Dispense Refill  . amLODipine (NORVASC) 10 MG tablet TAKE 1 TABLET TWICE A DAY 60 tablet 8  . carvedilol (COREG) 25 MG tablet TAKE 1 TABLET BY MOUTH 2 TIMES DAILY WITH A MEAL. 60 tablet 9  . triamcinolone cream (KENALOG) 0.1 % APPLY TOPICALLY 2 (TWO) TIMES DAILY AS NEEDED. FOR RASH 45 g 2   No current facility-administered medications on file prior to visit.    Allergies  Allergen Reactions  . Chlorthalidone     REACTION: felt "bad" and ED  . Hydrocodone Other (See Comments)    Nausea and dizzy  . Sulfonamide Derivatives     REACTION: unspecified    Past Medical History  Diagnosis Date  . Allergy   . Hypertension   . Renal insufficiency   . BPH (benign prostatic hypertrophy)   . Zoster 2007    facial, hospital 2007    No past surgical history on file.  Family History  Problem Relation Age of Onset  . Hypertension Mother   . Cancer Mother     lung cancer  . Hypertension Brother   . Coronary artery disease Neg Hx   . Diabetes Neg Hx   . Heart disease Father     History   Social History  . Marital Status: Married    Spouse Name: N/A  . Number of Children: 2  . Years of Education: N/A   Occupational History  . Builder, building new homes    Social History Main Topics  . Smoking status: Never Smoker     . Smokeless tobacco: Never Used  . Alcohol Use: Yes     Comment: occasional  . Drug Use: Not on file  . Sexual Activity: Not on file   Other Topics Concern  . Not on file   Social History Narrative   Review of Systems  Constitutional: Negative for fatigue and unexpected weight change.       Wears seat belt  HENT: Positive for dental problem. Negative for hearing loss and tinnitus.        Needs dental work  Eyes: Negative for visual disturbance.       No diplopia or unilateral vision loss  Respiratory: Positive for cough. Negative for chest tightness and shortness of breath.        Occ cough  Cardiovascular: Negative for chest pain, palpitations and leg swelling.  Gastrointestinal: Negative for nausea, vomiting, abdominal pain, constipation and blood in stool.       Occ heartburn-- like with hamburger at times. Alka seltzer helps  Endocrine: Negative for polydipsia and polyuria.  Genitourinary: Positive for difficulty urinating. Negative for urgency.       Stream is slow now Uses cialis occasionally  Musculoskeletal: Positive for arthralgias. Negative for back pain and joint swelling.  Skin:  Positive for rash.  Allergic/Immunologic: Negative for environmental allergies and immunocompromised state.  Neurological: Negative for dizziness, syncope, weakness, light-headedness, numbness and headaches.  Hematological: Negative for adenopathy. Does not bruise/bleed easily.  Psychiatric/Behavioral: Positive for sleep disturbance. Negative for dysphoric mood. The patient is not nervous/anxious.        Stress with mom's death---family expects him to handle everything Sleep has been affected. Snores but no apnea. Refreshed in AM       Objective:   Physical Exam  Constitutional: He is oriented to person, place, and time. He appears well-developed and well-nourished. No distress.  HENT:  Head: Normocephalic and atraumatic.  Right Ear: External ear normal.  Left Ear: External ear  normal.  Mouth/Throat: Oropharynx is clear and moist.  Eyes: Conjunctivae and EOM are normal. Pupils are equal, round, and reactive to light.  Neck: Normal range of motion. Neck supple. No thyromegaly present.  Cardiovascular: Normal rate, regular rhythm, normal heart sounds and intact distal pulses.  Exam reveals no gallop.   No murmur heard. Pulmonary/Chest: Effort normal and breath sounds normal. No respiratory distress. He has no wheezes. He has no rales.  Abdominal: Soft. There is no tenderness.  Musculoskeletal: He exhibits no edema or tenderness.  Lymphadenopathy:    He has no cervical adenopathy.  Neurological: He is alert and oriented to person, place, and time.  Skin:  Red, mac-pap rash around neck (V distribution like from sun exposure)  Psychiatric: He has a normal mood and affect. His behavior is normal.          Assessment & Plan:

## 2015-05-09 NOTE — Assessment & Plan Note (Signed)
BP Readings from Last 3 Encounters:  05/09/15 110/60  08/08/14 120/70  05/05/14 138/70   Repeat 120/78 on right  Will stop the amlodipine since has small incident of rash Add ACEI

## 2015-05-09 NOTE — Assessment & Plan Note (Signed)
Generally healthy Will do PSA and fecal immunoassay Recommended yearly flu shot

## 2015-05-09 NOTE — Assessment & Plan Note (Signed)
Did have nephrology eval Never got results of testing Will restart trial of low dose ACEI

## 2015-05-09 NOTE — Patient Instructions (Signed)
Please stop the amlodipine. Start the lisinopril daily Please take your blood pressure occasionally---let me know if it is over 140/90. Please use a 30 SPF sunblock or higher around your neck when you are outside. Come back in about a month for a blood test

## 2015-05-11 ENCOUNTER — Encounter: Payer: 59 | Admitting: Internal Medicine

## 2015-05-11 LAB — COMPREHENSIVE METABOLIC PANEL
ALT: 12 U/L (ref 0–53)
AST: 15 U/L (ref 0–37)
Albumin: 4.3 g/dL (ref 3.5–5.2)
Alkaline Phosphatase: 76 U/L (ref 39–117)
BUN: 23 mg/dL (ref 6–23)
CHLORIDE: 105 meq/L (ref 96–112)
CO2: 24 mEq/L (ref 19–32)
Calcium: 9.5 mg/dL (ref 8.4–10.5)
Creatinine, Ser: 1.82 mg/dL — ABNORMAL HIGH (ref 0.40–1.50)
GFR: 49.13 mL/min — AB (ref >60.00)
Glucose, Bld: 105 mg/dL — ABNORMAL HIGH (ref 70–99)
Potassium: 4.4 mEq/L (ref 3.5–5.1)
Sodium: 137 mEq/L (ref 135–145)
Total Bilirubin: 0.4 mg/dL (ref 0.2–1.2)
Total Protein: 7.2 g/dL (ref 6.0–8.3)

## 2015-05-11 LAB — CBC WITH DIFFERENTIAL/PLATELET
Basophils Absolute: 0.1 10*3/uL (ref 0.0–0.1)
Basophils Relative: 0.7 % (ref 0.0–3.0)
EOS PCT: 4.2 % (ref 0.0–5.0)
Eosinophils Absolute: 0.3 10*3/uL (ref 0.0–0.7)
HCT: 43.1 % (ref 39.0–52.0)
Hemoglobin: 14.4 g/dL (ref 13.0–17.0)
LYMPHS ABS: 1.9 10*3/uL (ref 0.7–4.0)
LYMPHS PCT: 25.7 % (ref 12.0–46.0)
MCHC: 33.5 g/dL (ref 30.0–36.0)
MCV: 83.2 fl (ref 78.0–100.0)
Monocytes Absolute: 0.5 10*3/uL (ref 0.1–1.0)
Monocytes Relative: 6.2 % (ref 3.0–12.0)
Neutro Abs: 4.7 10*3/uL (ref 1.4–7.7)
Neutrophils Relative %: 63.2 % (ref 43.0–77.0)
Platelets: 330 10*3/uL (ref 150.0–400.0)
RBC: 5.18 Mil/uL (ref 4.22–5.81)
RDW: 13.8 % (ref 11.5–15.5)
WBC: 7.5 10*3/uL (ref 4.0–10.5)

## 2015-05-11 LAB — T4, FREE: Free T4: 0.59 ng/dL — ABNORMAL LOW (ref 0.60–1.60)

## 2015-05-11 LAB — PSA: PSA: 1.01 ng/mL (ref 0.10–4.00)

## 2015-05-14 ENCOUNTER — Encounter: Payer: Self-pay | Admitting: *Deleted

## 2015-05-18 ENCOUNTER — Other Ambulatory Visit: Payer: Self-pay | Admitting: Internal Medicine

## 2015-06-14 ENCOUNTER — Other Ambulatory Visit (INDEPENDENT_AMBULATORY_CARE_PROVIDER_SITE_OTHER): Payer: 59

## 2015-06-14 DIAGNOSIS — N183 Chronic kidney disease, stage 3 unspecified: Secondary | ICD-10-CM

## 2015-06-14 LAB — RENAL FUNCTION PANEL
Albumin: 4.1 g/dL (ref 3.5–5.2)
BUN: 21 mg/dL (ref 6–23)
CHLORIDE: 103 meq/L (ref 96–112)
CO2: 25 mEq/L (ref 19–32)
CREATININE: 2.19 mg/dL — AB (ref 0.40–1.50)
Calcium: 9.6 mg/dL (ref 8.4–10.5)
GFR: 39.67 mL/min — AB (ref >60.00)
Glucose, Bld: 94 mg/dL (ref 70–99)
PHOSPHORUS: 2.5 mg/dL (ref 2.3–4.6)
POTASSIUM: 4 meq/L (ref 3.5–5.1)
Sodium: 137 mEq/L (ref 135–145)

## 2016-05-14 ENCOUNTER — Encounter: Payer: Self-pay | Admitting: Internal Medicine

## 2016-05-14 ENCOUNTER — Ambulatory Visit (INDEPENDENT_AMBULATORY_CARE_PROVIDER_SITE_OTHER): Payer: 59 | Admitting: Internal Medicine

## 2016-05-14 VITALS — BP 160/94 | HR 63 | Temp 98.0°F | Ht 67.5 in | Wt 197.0 lb

## 2016-05-14 DIAGNOSIS — Z0001 Encounter for general adult medical examination with abnormal findings: Secondary | ICD-10-CM

## 2016-05-14 DIAGNOSIS — N183 Chronic kidney disease, stage 3 unspecified: Secondary | ICD-10-CM

## 2016-05-14 DIAGNOSIS — Z0000 Encounter for general adult medical examination without abnormal findings: Secondary | ICD-10-CM

## 2016-05-14 DIAGNOSIS — Z1211 Encounter for screening for malignant neoplasm of colon: Secondary | ICD-10-CM

## 2016-05-14 DIAGNOSIS — I10 Essential (primary) hypertension: Secondary | ICD-10-CM

## 2016-05-14 LAB — COMPREHENSIVE METABOLIC PANEL
ALT: 15 U/L (ref 0–53)
AST: 19 U/L (ref 0–37)
Albumin: 4.4 g/dL (ref 3.5–5.2)
Alkaline Phosphatase: 69 U/L (ref 39–117)
BUN: 21 mg/dL (ref 6–23)
CO2: 26 meq/L (ref 19–32)
Calcium: 9.5 mg/dL (ref 8.4–10.5)
Chloride: 102 mEq/L (ref 96–112)
Creatinine, Ser: 2 mg/dL — ABNORMAL HIGH (ref 0.40–1.50)
GFR: 43.91 mL/min — AB (ref >60.00)
Glucose, Bld: 93 mg/dL (ref 70–99)
POTASSIUM: 4.5 meq/L (ref 3.5–5.1)
SODIUM: 135 meq/L (ref 135–145)
TOTAL PROTEIN: 7.7 g/dL (ref 6.0–8.3)
Total Bilirubin: 0.4 mg/dL (ref 0.2–1.2)

## 2016-05-14 LAB — CBC WITH DIFFERENTIAL/PLATELET
BASOS PCT: 0.5 % (ref 0.0–3.0)
Basophils Absolute: 0 10*3/uL (ref 0.0–0.1)
EOS PCT: 1.3 % (ref 0.0–5.0)
Eosinophils Absolute: 0.1 10*3/uL (ref 0.0–0.7)
HCT: 45 % (ref 39.0–52.0)
Hemoglobin: 15 g/dL (ref 13.0–17.0)
LYMPHS ABS: 2.4 10*3/uL (ref 0.7–4.0)
Lymphocytes Relative: 25.8 % (ref 12.0–46.0)
MCHC: 33.4 g/dL (ref 30.0–36.0)
MCV: 84.7 fl (ref 78.0–100.0)
MONO ABS: 0.6 10*3/uL (ref 0.1–1.0)
MONOS PCT: 6 % (ref 3.0–12.0)
NEUTROS ABS: 6.3 10*3/uL (ref 1.4–7.7)
NEUTROS PCT: 66.4 % (ref 43.0–77.0)
PLATELETS: 342 10*3/uL (ref 150.0–400.0)
RBC: 5.31 Mil/uL (ref 4.22–5.81)
RDW: 13.5 % (ref 11.5–15.5)
WBC: 9.4 10*3/uL (ref 4.0–10.5)

## 2016-05-14 LAB — LIPID PANEL
Cholesterol: 189 mg/dL (ref 0–200)
HDL: 48.8 mg/dL (ref >39.00)
LDL Cholesterol: 116 mg/dL — ABNORMAL HIGH (ref 0–99)
NONHDL: 140.65
Total CHOL/HDL Ratio: 4
Triglycerides: 123 mg/dL (ref 0.0–149.0)
VLDL: 24.6 mg/dL (ref 0.0–40.0)

## 2016-05-14 LAB — T4, FREE: FREE T4: 0.61 ng/dL (ref 0.60–1.60)

## 2016-05-14 MED ORDER — DILTIAZEM HCL ER 120 MG PO CP24: 120.0000 mg | ORAL_CAPSULE | Freq: Every day | ORAL | Status: DC

## 2016-05-14 MED ORDER — TADALAFIL 5 MG PO TABS: 5.0000 mg | ORAL_TABLET | Freq: Every day | ORAL | Status: DC | PRN

## 2016-05-14 NOTE — Progress Notes (Signed)
Pre visit review using our clinic review tool, if applicable. No additional management support is needed unless otherwise documented below in the visit note. 
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
## 2016-05-14 NOTE — Patient Instructions (Signed)
Please take 2 of the lisinopril daily (10mg ). I will let you know tomorrow about your blood tests--you may need to go back to a kidney specialist. Keep your appointment next week.

## 2016-05-14 NOTE — Assessment & Plan Note (Signed)
I am concerned about the cramping If worse, emergent visit with nephrologist

## 2016-05-14 NOTE — Assessment & Plan Note (Signed)
Overall okay--except BP Will do FIT Defer PSA till at least next year

## 2016-05-14 NOTE — Addendum Note (Signed)
Addended by: Daralene Milch C on: 05/14/2016 04:07 PM   Modules accepted: Miquel Dunn

## 2016-05-14 NOTE — Assessment & Plan Note (Signed)
BP Readings from Last 3 Encounters:  05/14/16 160/94  05/09/15 110/60  08/08/14 120/70   Repeat BP 216/110!!  Will double lisinopril (as long as renal function okay) Add diltiazem (didn't tolerate amlodipine or carvedilol) Probably needs diuretic but worried about kidneys See next week

## 2016-05-21 ENCOUNTER — Ambulatory Visit (INDEPENDENT_AMBULATORY_CARE_PROVIDER_SITE_OTHER): Payer: 59 | Admitting: Internal Medicine

## 2016-05-21 ENCOUNTER — Encounter: Payer: Self-pay | Admitting: Internal Medicine

## 2016-05-21 VITALS — BP 172/102 | HR 64 | Temp 98.1°F | Wt 201.0 lb

## 2016-05-21 DIAGNOSIS — I10 Essential (primary) hypertension: Secondary | ICD-10-CM | POA: Diagnosis not present

## 2016-05-21 MED ORDER — LISINOPRIL-HYDROCHLOROTHIAZIDE 10-12.5 MG PO TABS: 1.0000 | ORAL_TABLET | Freq: Every day | ORAL | Status: DC

## 2016-05-21 NOTE — Assessment & Plan Note (Signed)
BP Readings from Last 3 Encounters:  05/21/16 172/102  05/14/16 160/94  05/09/15 110/60   Missed dose last night but still quite high Will add HCTZ in low dose Double the diltiazem for now

## 2016-05-21 NOTE — Progress Notes (Signed)
Pre visit review using our clinic review tool, if applicable. No additional management support is needed unless otherwise documented below in the visit note. 

## 2016-05-21 NOTE — Progress Notes (Signed)
   Subjective:    Patient ID: Devin Harmon, male    DOB: 13-Jul-1956, 60 y.o.   MRN: [id number]  HPI Here for follow up of HTN  Not able to take the BP meds together--it makes him sick Takes the lisinopril in AM and diltiazem in evening (but missed yesterday)  No headaches No chest pain No SOB  Current Outpatient Prescriptions on File Prior to Visit  Medication Sig Dispense Refill  . diltiazem (DILACOR XR) 120 MG 24 hr capsule Take 1-2 capsules (120-240 mg total) by mouth daily. 60 capsule 1  . lisinopril (PRINIVIL,ZESTRIL) 5 MG tablet Take 1 tablet (5 mg total) by mouth daily. 90 tablet 3  . triamcinolone cream (KENALOG) 0.1 % APPLY TO AFFECTED AREA TWICE A DAY AS NEEDED FOR RASH 30 g 2   No current facility-administered medications on file prior to visit.    Allergies  Allergen Reactions  . Chlorthalidone     REACTION: felt "bad" and ED  . Hydrocodone Other (See Comments)    Nausea and dizzy  . Sulfonamide Derivatives     REACTION: unspecified    Past Medical History  Diagnosis Date  . Allergy   . Hypertension   . Renal insufficiency   . BPH (benign prostatic hypertrophy)   . Zoster 2007    facial, hospital 2007    No past surgical history on file.  Family History  Problem Relation Age of Onset  . Hypertension Mother   . Cancer Mother     lung cancer  . Hypertension Brother   . Coronary artery disease Neg Hx   . Diabetes Neg Hx   . Heart disease Father     Social History   Social History  . Marital Status: Married    Spouse Name: N/A  . Number of Children: 2  . Years of Education: N/A   Occupational History  . Builder, building new homes    Social History Main Topics  . Smoking status: Never Smoker   . Smokeless tobacco: Never Used  . Alcohol Use: Yes     Comment: occasional  . Drug Use: Not on file  . Sexual Activity: Not on file   Other Topics Concern  . Not on file   Social History Narrative   Review of Systems Now has medication  holder--to help remember Sleeps fine    Objective:   Physical Exam  Constitutional: He appears well-developed and well-nourished. No distress.  Neck: Normal range of motion. Neck supple. No thyromegaly present.  Cardiovascular: Normal rate, regular rhythm and normal heart sounds.  Exam reveals no gallop.   No murmur heard. Pulmonary/Chest: Effort normal and breath sounds normal. No respiratory distress. He has no wheezes. He has no rales.  Lymphadenopathy:    He has no cervical adenopathy.          Assessment & Plan:

## 2016-05-21 NOTE — Patient Instructions (Signed)
Please increase the diltiazem to 2 tablets at bedtime. Stop you prior lisinopril (that you were taking 2 of) and substitute the 1 lisinopril 10mg  --take in the morning. Let me know if you have any problems.

## 2016-06-04 ENCOUNTER — Encounter: Payer: Self-pay | Admitting: Internal Medicine

## 2016-06-04 ENCOUNTER — Ambulatory Visit (INDEPENDENT_AMBULATORY_CARE_PROVIDER_SITE_OTHER): Payer: 59 | Admitting: Internal Medicine

## 2016-06-04 VITALS — BP 158/84 | HR 62 | Temp 97.8°F | Wt 198.0 lb

## 2016-06-04 DIAGNOSIS — I10 Essential (primary) hypertension: Secondary | ICD-10-CM

## 2016-06-04 LAB — RENAL FUNCTION PANEL
ALBUMIN: 4.2 g/dL (ref 3.5–5.2)
BUN: 37 mg/dL — AB (ref 6–23)
CHLORIDE: 100 meq/L (ref 96–112)
CO2: 27 meq/L (ref 19–32)
Calcium: 9.4 mg/dL (ref 8.4–10.5)
Creatinine, Ser: 2.65 mg/dL — ABNORMAL HIGH (ref 0.40–1.50)
GFR: 31.73 mL/min — ABNORMAL LOW (ref >60.00)
Glucose, Bld: 144 mg/dL — ABNORMAL HIGH (ref 70–99)
Phosphorus: 2.7 mg/dL (ref 2.3–4.6)
Potassium: 4 mEq/L (ref 3.5–5.1)
Sodium: 136 mEq/L (ref 135–145)

## 2016-06-04 MED ORDER — DILTIAZEM HCL ER 240 MG PO CP24: 240.0000 mg | ORAL_CAPSULE | Freq: Every day | ORAL | Status: DC

## 2016-06-04 NOTE — Progress Notes (Signed)
   Subjective:    Patient ID: Devin Harmon, male    DOB: 1955/12/28, 60 y.o.   MRN: [id number]  HPI Here for follow up of HTN  The medications have mad him sick "That medicine doesn't like heat" ---the diltiazem. Takes it in evening Uses the other in AM Does okay if he splits them If he takes them all together in the morning--- he gets sick  Checked BP 2 days ago It was "good" per wife---but doesn't remember the number Some stress with big job now--may have his BP up some  No headaches No chest pain or SOB No sig dizziness (unless he takes the meds together)  Current Outpatient Prescriptions on File Prior to Visit  Medication Sig Dispense Refill  . diltiazem (DILACOR XR) 120 MG 24 hr capsule Take 1-2 capsules (120-240 mg total) by mouth daily. 60 capsule 1  . lisinopril-hydrochlorothiazide (PRINZIDE,ZESTORETIC) 10-12.5 MG tablet Take 1 tablet by mouth daily. 90 tablet 3  . triamcinolone cream (KENALOG) 0.1 % APPLY TO AFFECTED AREA TWICE A DAY AS NEEDED FOR RASH 30 g 2   No current facility-administered medications on file prior to visit.    Allergies  Allergen Reactions  . Chlorthalidone     REACTION: felt "bad" and ED  . Hydrocodone Other (See Comments)    Nausea and dizzy  . Sulfonamide Derivatives     REACTION: unspecified    Past Medical History  Diagnosis Date  . Allergy   . Hypertension   . Renal insufficiency   . BPH (benign prostatic hypertrophy)   . Zoster 2007    facial, hospital 2007    No past surgical history on file.  Family History  Problem Relation Age of Onset  . Hypertension Mother   . Cancer Mother     lung cancer  . Hypertension Brother   . Coronary artery disease Neg Hx   . Diabetes Neg Hx   . Heart disease Father     Social History   Social History  . Marital Status: Married    Spouse Name: N/A  . Number of Children: 2  . Years of Education: N/A   Occupational History  . Builder, building new homes    Social History Main  Topics  . Smoking status: Never Smoker   . Smokeless tobacco: Never Used  . Alcohol Use: Yes     Comment: occasional  . Drug Use: Not on file  . Sexual Activity: Not on file   Other Topics Concern  . Not on file   Social History Narrative   Review of Systems  Drinks gatorade when working outside Appetite is fine     Objective:   Physical Exam  Constitutional: He appears well-developed and well-nourished. No distress.  Neck: Normal range of motion. Neck supple. No thyromegaly present.  Cardiovascular: Normal rate, regular rhythm and normal heart sounds.  Exam reveals no gallop.   No murmur heard. Pulmonary/Chest: Effort normal and breath sounds normal. No respiratory distress. He has no wheezes. He has no rales.  Lymphadenopathy:    He has no cervical adenopathy.          Assessment & Plan:

## 2016-06-04 NOTE — Progress Notes (Signed)
Pre visit review using our clinic review tool, if applicable. No additional management support is needed unless otherwise documented below in the visit note. 

## 2016-06-04 NOTE — Addendum Note (Signed)
Addended by: Daralene Milch C on: 06/04/2016 01:55 PM   Modules accepted: Miquel Dunn

## 2016-06-04 NOTE — Assessment & Plan Note (Signed)
BP Readings from Last 3 Encounters:  06/04/16 158/84  05/21/16 172/102  05/14/16 160/94   Clearly better---and having trouble tolerating the meds Will continue current doses Check renal profile on HCTZ Recheck 2 months

## 2016-06-05 ENCOUNTER — Telehealth: Payer: Self-pay

## 2016-06-05 MED ORDER — LISINOPRIL 10 MG PO TABS: 10.0000 mg | ORAL_TABLET | Freq: Every day | ORAL | Status: DC

## 2016-06-05 NOTE — Telephone Encounter (Signed)
Pt's wife called to verify what medication he neeed to take. I explained we were removing the HCTZ but he was to continue on Lisinopril 10mg . He needed a rx so I sent it in. We decided to do a 30 day with a few refills just in case it was changed again.

## 2016-08-06 ENCOUNTER — Encounter: Payer: Self-pay | Admitting: Internal Medicine

## 2016-08-06 ENCOUNTER — Ambulatory Visit (INDEPENDENT_AMBULATORY_CARE_PROVIDER_SITE_OTHER): Payer: 59 | Admitting: Internal Medicine

## 2016-08-06 DIAGNOSIS — N183 Chronic kidney disease, stage 3 unspecified: Secondary | ICD-10-CM

## 2016-08-06 DIAGNOSIS — I10 Essential (primary) hypertension: Secondary | ICD-10-CM

## 2016-08-06 NOTE — Assessment & Plan Note (Signed)
Clearly even worse on the HCTZ--though it helped his BP Will set back up with Dr Holley Raring

## 2016-08-06 NOTE — Assessment & Plan Note (Signed)
BP Readings from Last 3 Encounters:  08/06/16 (!) 184/100  06/04/16 (!) 158/84  05/21/16 (!) 172/102   Repeat about the same on right Will need more aggressive Rx and reeval by nephrologist

## 2016-08-06 NOTE — Progress Notes (Signed)
   Subjective:    Patient ID: Devin Harmon, male    DOB: [birth year], 60 y.o.   MRN: [id number]  HPI Here for follow up of HTN  No problems with the medications Taking both in the evening May miss one dose a week at most  No bad feelings on these No headaches No chest pain No SOB  Current Outpatient Prescriptions on File Prior to Visit  Medication Sig Dispense Refill  . diltiazem (DILACOR XR) 240 MG 24 hr capsule Take 1 capsule (240 mg total) by mouth daily. 90 capsule 3  . lisinopril (PRINIVIL,ZESTRIL) 10 MG tablet Take 1 tablet (10 mg total) by mouth daily. 30 tablet 5   No current facility-administered medications on file prior to visit.     Allergies  Allergen Reactions  . Chlorthalidone     REACTION: felt "bad" and ED  . Hydrocodone Other (See Comments)    Nausea and dizzy  . Sulfonamide Derivatives     REACTION: unspecified    Past Medical History:  Diagnosis Date  . Allergy   . BPH (benign prostatic hypertrophy)   . Hypertension   . Renal insufficiency   . Zoster 2007   facial, hospital 2007    No past surgical history on file.  Family History  Problem Relation Age of Onset  . Hypertension Mother   . Cancer Mother     lung cancer  . Hypertension Brother   . Coronary artery disease Neg Hx   . Diabetes Neg Hx   . Heart disease Father     Social History   Social History  . Marital status: Married    Spouse name: N/A  . Number of children: 2  . Years of education: N/A   Occupational History  . Builder, building new homes    Social History Main Topics  . Smoking status: Never Smoker  . Smokeless tobacco: Never Used  . Alcohol use Yes     Comment: occasional  . Drug use: Unknown  . Sexual activity: Not on file   Other Topics Concern  . Not on file   Social History Narrative  . No narrative on file     Review of Systems No cramps now No palpitations  Sleeps okay    Objective:   Physical Exam  Constitutional: He appears  well-developed and well-nourished. No distress.  Neck: Normal range of motion. Neck supple. No thyromegaly present.  Cardiovascular: Normal rate, regular rhythm and normal heart sounds.  Exam reveals no gallop.   No murmur heard. Pulmonary/Chest: Effort normal and breath sounds normal. No respiratory distress. He has no wheezes. He has no rales.  Musculoskeletal: He exhibits no edema.  Lymphadenopathy:    He has no cervical adenopathy.          Assessment & Plan:

## 2016-08-06 NOTE — Progress Notes (Signed)
Pre visit review using our clinic review tool, if applicable. No additional management support is needed unless otherwise documented below in the visit note. 

## 2017-01-14 ENCOUNTER — Other Ambulatory Visit: Payer: Self-pay | Admitting: Internal Medicine

## 2017-05-04 ENCOUNTER — Other Ambulatory Visit: Payer: Self-pay | Admitting: Internal Medicine

## 2017-07-02 ENCOUNTER — Other Ambulatory Visit: Payer: Self-pay | Admitting: Internal Medicine

## 2017-07-27 ENCOUNTER — Ambulatory Visit (INDEPENDENT_AMBULATORY_CARE_PROVIDER_SITE_OTHER): Payer: 59 | Admitting: Internal Medicine

## 2017-07-27 ENCOUNTER — Encounter: Payer: Self-pay | Admitting: Internal Medicine

## 2017-07-27 VITALS — BP 160/70 | HR 60 | Temp 98.1°F | Ht 67.5 in | Wt 196.1 lb

## 2017-07-27 DIAGNOSIS — N183 Chronic kidney disease, stage 3 unspecified: Secondary | ICD-10-CM

## 2017-07-27 DIAGNOSIS — Z1211 Encounter for screening for malignant neoplasm of colon: Secondary | ICD-10-CM

## 2017-07-27 DIAGNOSIS — I10 Essential (primary) hypertension: Secondary | ICD-10-CM

## 2017-07-27 DIAGNOSIS — Z125 Encounter for screening for malignant neoplasm of prostate: Secondary | ICD-10-CM | POA: Diagnosis not present

## 2017-07-27 DIAGNOSIS — Z0000 Encounter for general adult medical examination without abnormal findings: Secondary | ICD-10-CM

## 2017-07-27 LAB — CBC WITH DIFFERENTIAL/PLATELET
BASOS PCT: 0.5 % (ref 0.0–3.0)
Basophils Absolute: 0.1 10*3/uL (ref 0.0–0.1)
EOS ABS: 0.2 10*3/uL (ref 0.0–0.7)
EOS PCT: 1.8 % (ref 0.0–5.0)
HCT: 45.3 % (ref 39.0–52.0)
Hemoglobin: 15 g/dL (ref 13.0–17.0)
LYMPHS ABS: 2.3 10*3/uL (ref 0.7–4.0)
Lymphocytes Relative: 24.4 % (ref 12.0–46.0)
MCHC: 33.1 g/dL (ref 30.0–36.0)
MCV: 85 fl (ref 78.0–100.0)
MONO ABS: 0.5 10*3/uL (ref 0.1–1.0)
Monocytes Relative: 5.7 % (ref 3.0–12.0)
NEUTROS ABS: 6.5 10*3/uL (ref 1.4–7.7)
NEUTROS PCT: 67.6 % (ref 43.0–77.0)
PLATELETS: 342 10*3/uL (ref 150.0–400.0)
RBC: 5.33 Mil/uL (ref 4.22–5.81)
RDW: 14.2 % (ref 11.5–15.5)
WBC: 9.6 10*3/uL (ref 4.0–10.5)

## 2017-07-27 LAB — COMPREHENSIVE METABOLIC PANEL
ALBUMIN: 4.3 g/dL (ref 3.5–5.2)
ALT: 16 U/L (ref 0–53)
AST: 15 U/L (ref 0–37)
Alkaline Phosphatase: 67 U/L (ref 39–117)
BILIRUBIN TOTAL: 0.5 mg/dL (ref 0.2–1.2)
BUN: 23 mg/dL (ref 6–23)
CALCIUM: 9.9 mg/dL (ref 8.4–10.5)
CO2: 29 meq/L (ref 19–32)
CREATININE: 2.16 mg/dL — AB (ref 0.40–1.50)
Chloride: 104 mEq/L (ref 96–112)
GFR: 40.02 mL/min — ABNORMAL LOW (ref >60.00)
Glucose, Bld: 96 mg/dL (ref 70–99)
Potassium: 4.8 mEq/L (ref 3.5–5.1)
SODIUM: 137 meq/L (ref 135–145)
Total Protein: 7.4 g/dL (ref 6.0–8.3)

## 2017-07-27 LAB — PSA: PSA: 1.29 ng/mL (ref 0.10–4.00)

## 2017-07-27 MED ORDER — TADALAFIL 5 MG PO TABS: 5.0000 mg | ORAL_TABLET | Freq: Every day | ORAL | 11 refills | Status: DC | PRN

## 2017-07-27 MED ORDER — LISINOPRIL 20 MG PO TABS: 20.0000 mg | ORAL_TABLET | Freq: Every day | ORAL | 11 refills | Status: DC

## 2017-07-27 NOTE — Assessment & Plan Note (Addendum)
BP Readings from Last 3 Encounters:  07/27/17 (!) 160/70  08/06/16 (!) 184/100  06/04/16 (!) 158/84   Off his meds Trouble with the diltiazem and didn't tolerate the amlodipine Renal function worse on the HCTZ Will restart his lisinopril--try higher dose though (pending renal function)

## 2017-07-27 NOTE — Progress Notes (Signed)
Subjective:    Patient ID: Devin Harmon, male    DOB: [birth year], 61 y.o.   MRN: [id number]  HPI Here for physical  He didn't go back to the kidney specialist Hasn't taken the medications for a few days Concerned about the diltiazem--messed up bowels (slow and not going but every 2-3 days) No problems with lisinopril Ran out of both in past 1-2 days  Current Outpatient Prescriptions on File Prior to Visit  Medication Sig Dispense Refill  . diltiazem (DILACOR XR) 240 MG 24 hr capsule Take 1 capsule (240 mg total) by mouth daily. 90 capsule 3  . lisinopril (PRINIVIL,ZESTRIL) 10 MG tablet TAKE 1 TABLET BY MOUTH EVERY DAY NEEDS OFFICE VISIT 15 tablet 0   No current facility-administered medications on file prior to visit.     Allergies  Allergen Reactions  . Chlorthalidone     REACTION: felt "bad" and ED  . Hydrocodone Other (See Comments)    Nausea and dizzy  . Sulfonamide Derivatives     REACTION: unspecified    Past Medical History:  Diagnosis Date  . Allergy   . BPH (benign prostatic hypertrophy)   . Hypertension   . Renal insufficiency   . Zoster 2007   facial, hospital 2007    No past surgical history on file.  Family History  Problem Relation Age of Onset  . Hypertension Mother   . Cancer Mother        lung cancer  . Hypertension Brother   . Coronary artery disease Neg Hx   . Diabetes Neg Hx   . Heart disease Father     Social History   Social History  . Marital status: Married    Spouse name: N/A  . Number of children: 2  . Years of education: N/A   Occupational History  . Builder, building new homes    Social History Main Topics  . Smoking status: Never Smoker  . Smokeless tobacco: Never Used  . Alcohol use Yes     Comment: occasional  . Drug use: Unknown  . Sexual activity: Not on file   Other Topics Concern  . Not on file   Social History Narrative  . No narrative on file   Review of Systems  Constitutional: Negative for  fatigue and unexpected weight change.       Wears seat belt  HENT: Negative for hearing loss and tinnitus.        Needs top denture after extractions. Bottoms--no action for now  Eyes: Negative for visual disturbance.       No diplopia or unilateral vision loss  Respiratory: Negative for cough, chest tightness and shortness of breath.   Cardiovascular: Negative for chest pain, palpitations and leg swelling.  Gastrointestinal: Positive for constipation. Negative for abdominal pain and nausea.  Endocrine: Negative for polydipsia and polyuria.  Genitourinary: Negative for urgency.       Slower stream Nocturia as often as 4/night ED for over a year--- has borrowed from friends (cialis in past)  Musculoskeletal: Positive for neck pain. Negative for back pain and joint swelling.       Tylenol prn  Skin: Negative for rash.       Rash on face better--- was from new pillow his wife got him  Allergic/Immunologic: Negative for environmental allergies and immunocompromised state.  Neurological: Negative for dizziness, syncope, light-headedness and headaches.  Hematological: Negative for adenopathy. Does not bruise/bleed easily.  Psychiatric/Behavioral: Negative for dysphoric mood and sleep disturbance. The  patient is not nervous/anxious.        Objective:   Physical Exam  Constitutional: He is oriented to person, place, and time. He appears well-developed and well-nourished. No distress.  HENT:  Head: Normocephalic and atraumatic.  Right Ear: External ear normal.  Left Ear: External ear normal.  Mouth/Throat: Oropharynx is clear and moist. No oropharyngeal exudate.  Eyes: Pupils are equal, round, and reactive to light. Conjunctivae are normal.  Neck: Normal range of motion. Neck supple. No thyromegaly present.  Cardiovascular: Normal rate, regular rhythm and intact distal pulses.  Exam reveals no gallop.   Soft systolic murmur along left sternal border  Pulmonary/Chest: Effort normal and  breath sounds normal. No respiratory distress. He has no wheezes. He has no rales.  Abdominal: Soft. There is no tenderness.  Musculoskeletal: He exhibits no edema or tenderness.  Lymphadenopathy:    He has no cervical adenopathy.  Neurological: He is alert and oriented to person, place, and time.  Skin: No rash noted. No erythema.  Psychiatric: He has a normal mood and affect. His behavior is normal.          Assessment & Plan:

## 2017-07-27 NOTE — Assessment & Plan Note (Signed)
Discussed PSA---will check FIT--- urged him to to it He prefers no flu vaccines Will consider shingrix

## 2017-07-27 NOTE — Assessment & Plan Note (Signed)
He has been reluctant to go to nephrologist--- equates that with needing dialysis and he has had some friends die soon after dialysis started. Let him know that the consultation would be to prevent the ESRD

## 2017-07-29 ENCOUNTER — Telehealth: Payer: Self-pay

## 2017-07-29 ENCOUNTER — Other Ambulatory Visit: Payer: Self-pay | Admitting: Internal Medicine

## 2017-07-29 NOTE — Telephone Encounter (Signed)
Mrs Herbst signed) said pt was seen on 07/27/17; pt will take the lisinopril 20 mg taking one daily but pt will not take the diltiazem due to constipation. Lab result note was read to Mrs Purohit and she voiced understanding. FYI to Dr Silvio Pate.

## 2017-07-30 NOTE — Telephone Encounter (Signed)
Tried to call pt's wife, but the phone rang busy.

## 2017-07-30 NOTE — Telephone Encounter (Signed)
Yes---I had told him to stop the diltiazem and that is why I increased the lisinopril. It is important for him to keep his BP follow up in 1 month or so

## 2017-07-31 NOTE — Telephone Encounter (Signed)
Left detailed message on vm per dpr 

## 2017-08-13 ENCOUNTER — Other Ambulatory Visit (INDEPENDENT_AMBULATORY_CARE_PROVIDER_SITE_OTHER): Payer: 59

## 2017-08-13 DIAGNOSIS — Z1211 Encounter for screening for malignant neoplasm of colon: Secondary | ICD-10-CM | POA: Diagnosis not present

## 2017-08-13 LAB — FECAL OCCULT BLOOD, IMMUNOCHEMICAL: FECAL OCCULT BLD: NEGATIVE

## 2017-08-14 ENCOUNTER — Encounter: Payer: Self-pay | Admitting: *Deleted

## 2017-09-04 ENCOUNTER — Encounter: Payer: Self-pay | Admitting: Internal Medicine

## 2017-09-04 ENCOUNTER — Ambulatory Visit (INDEPENDENT_AMBULATORY_CARE_PROVIDER_SITE_OTHER): Payer: 59 | Admitting: Internal Medicine

## 2017-09-04 VITALS — BP 200/110 | HR 65 | Resp 18 | Wt 196.0 lb

## 2017-09-04 DIAGNOSIS — I10 Essential (primary) hypertension: Secondary | ICD-10-CM | POA: Diagnosis not present

## 2017-09-04 MED ORDER — LISINOPRIL 40 MG PO TABS: 40.0000 mg | ORAL_TABLET | Freq: Every day | ORAL | 3 refills | Status: DC

## 2017-09-04 MED ORDER — CLONIDINE HCL 0.1 MG/24HR TD PTWK: 0.1000 mg | MEDICATED_PATCH | TRANSDERMAL | 12 refills | Status: DC

## 2017-09-04 NOTE — Patient Instructions (Signed)
Please increase the lisinopril to 40mg  daily. (2 of the 20mg  till they run out). Start the clonidine patch--you replace it weekly. Talk to the pharmacist about how to put it on. Let me know if you have any problems.

## 2017-09-04 NOTE — Progress Notes (Signed)
   Subjective:    Patient ID: Devin Harmon, male    DOB: [birth year], 61 y.o.   MRN: [id number]  HPI Here for follow up of HTN  Stopped the diltiazem---due to constipation Taking the lisinopril every day--tolerates this Renal insuff on HCTZ Reports cramps on coreg??  No headache No chest pain No SOB  Current Outpatient Prescriptions on File Prior to Visit  Medication Sig Dispense Refill  . lisinopril (PRINIVIL,ZESTRIL) 20 MG tablet Take 1 tablet (20 mg total) by mouth daily. 30 tablet 11  . tadalafil (CIALIS) 5 MG tablet Take 1 tablet (5 mg total) by mouth daily as needed for erectile dysfunction. 4 tablet 11   No current facility-administered medications on file prior to visit.     Allergies  Allergen Reactions  . Chlorthalidone     REACTION: felt "bad" and ED  . Hydrocodone Other (See Comments)    Nausea and dizzy  . Sulfonamide Derivatives     REACTION: unspecified    Past Medical History:  Diagnosis Date  . Allergy   . BPH (benign prostatic hypertrophy)   . Hypertension   . Renal insufficiency   . Zoster 2007   facial, hospital 2007    No past surgical history on file.  Family History  Problem Relation Age of Onset  . Hypertension Mother   . Cancer Mother        lung cancer  . Hypertension Brother   . Coronary artery disease Neg Hx   . Diabetes Neg Hx   . Heart disease Father     Social History   Social History  . Marital status: Married    Spouse name: N/A  . Number of children: 2  . Years of education: N/A   Occupational History  . Builder, building new homes    Social History Main Topics  . Smoking status: Never Smoker  . Smokeless tobacco: Never Used  . Alcohol use Yes     Comment: occasional  . Drug use: Unknown  . Sexual activity: Not on file   Other Topics Concern  . Not on file   Social History Narrative  . No narrative on file   Review of Systems  Sleeps well Appetite is good     Objective:   Physical Exam    Constitutional: He appears well-nourished. No distress.  Neck: No thyromegaly present.  Cardiovascular: Normal rate, regular rhythm and normal heart sounds.  Exam reveals no gallop.   No murmur heard. Pulmonary/Chest: Effort normal and breath sounds normal. No respiratory distress. He has no wheezes. He has no rales.  Musculoskeletal: He exhibits no edema.  Lymphadenopathy:    He has no cervical adenopathy.          Assessment & Plan:

## 2017-09-04 NOTE — Assessment & Plan Note (Signed)
BP Readings from Last 3 Encounters:  09/04/17 (!) 200/110  07/27/17 (!) 160/70  08/06/16 (!) 184/100   Very resistant Not excited about going back to nephrologist Intolerant of thiazides and CCB HR --60---would not try another beta blocker Will check insurance for clonidine patch Can't take multiple doses due to insurance (so no hydralazine)  Will increase lisinopril to 20 Clonidine patch or terazosin

## 2017-10-07 ENCOUNTER — Ambulatory Visit: Payer: 59 | Admitting: Internal Medicine

## 2017-10-07 ENCOUNTER — Encounter: Payer: Self-pay | Admitting: Internal Medicine

## 2017-10-07 VITALS — BP 196/98 | HR 73 | Temp 98.0°F | Wt 196.0 lb

## 2017-10-07 DIAGNOSIS — S301XXA Contusion of abdominal wall, initial encounter: Secondary | ICD-10-CM | POA: Diagnosis not present

## 2017-10-07 DIAGNOSIS — I10 Essential (primary) hypertension: Secondary | ICD-10-CM

## 2017-10-07 DIAGNOSIS — S3011XA Contusion of abdominal wall, initial encounter: Secondary | ICD-10-CM

## 2017-10-07 MED ORDER — TERAZOSIN HCL 5 MG PO CAPS: 5.0000 mg | ORAL_CAPSULE | Freq: Every day | ORAL | 11 refills | Status: DC

## 2017-10-07 NOTE — Progress Notes (Signed)
   Subjective:    Patient ID: Devin Harmon, male    DOB: 03-13-1956, 61 y.o.   MRN: [id number]  HPI Here for follow up of severe HTN  Tried the clonidine patch--- wouldn't stay on him due to excessive sweating  [place] off ladder on job Pushed into left lower abdomen Has had abscess there and wonders about surgery First noticed it after the trauma  Current Outpatient Medications on File Prior to Visit  Medication Sig Dispense Refill  . lisinopril (PRINIVIL,ZESTRIL) 40 MG tablet Take 1 tablet (40 mg total) by mouth daily. 90 tablet 3   No current facility-administered medications on file prior to visit.     Allergies  Allergen Reactions  . Chlorthalidone     REACTION: felt "bad" and ED  . Hydrocodone Other (See Comments)    Nausea and dizzy  . Sulfonamide Derivatives     REACTION: unspecified    Past Medical History:  Diagnosis Date  . Allergy   . BPH (benign prostatic hypertrophy)   . Hypertension   . Renal insufficiency   . Zoster 2007   facial, hospital 2007    History reviewed. No pertinent surgical history.  Family History  Problem Relation Age of Onset  . Hypertension Mother   . Cancer Mother        lung cancer  . Hypertension Brother   . Coronary artery disease Neg Hx   . Diabetes Neg Hx   . Heart disease Father     Social History   Socioeconomic History  . Marital status: Married    Spouse name: Not on file  . Number of children: 2  . Years of education: Not on file  . Highest education level: Not on file  Social Needs  . Financial resource strain: Not on file  . Food insecurity - worry: Not on file  . Food insecurity - inability: Not on file  . Transportation needs - medical: Not on file  . Transportation needs - non-medical: Not on file  Occupational History  . Occupation: Museum/gallery curator, building new homes  Tobacco Use  . Smoking status: Never Smoker  . Smokeless tobacco: Never Used  Substance and Sexual Activity  . Alcohol use: Yes   Comment: occasional  . Drug use: Not on file  . Sexual activity: Not on file  Other Topics Concern  . Not on file  Social History Narrative  . Not on file   Review of Systems  Feels well Appetite is good Sleeps well     Objective:   Physical Exam  Constitutional: He appears well-developed. No distress.  Abdominal:  Large hard mass (~8cm diameter) in LLQ. No fluctuance or tenderness          Assessment & Plan:

## 2017-10-07 NOTE — Assessment & Plan Note (Addendum)
BP Readings from Last 3 Encounters:  10/07/17 (!) 196/98  09/04/17 (!) 200/110  07/27/17 (!) 160/70   Slightly better but has been intolerant of multiple meds Will try terazosin If intolerant or blood pressure still high--will try diltiazem again but with bowel regimen

## 2017-10-07 NOTE — Assessment & Plan Note (Signed)
Reassured that this should resolve on its own over time

## 2017-11-10 ENCOUNTER — Ambulatory Visit: Payer: 59 | Admitting: Internal Medicine

## 2018-08-05 ENCOUNTER — Ambulatory Visit: Payer: 59 | Admitting: Internal Medicine

## 2018-08-05 ENCOUNTER — Encounter: Payer: Self-pay | Admitting: Internal Medicine

## 2018-08-05 VITALS — BP 230/120 | HR 86 | Temp 98.5°F | Ht 67.5 in | Wt 203.0 lb

## 2018-08-05 DIAGNOSIS — I10 Essential (primary) hypertension: Secondary | ICD-10-CM | POA: Diagnosis not present

## 2018-08-05 MED ORDER — LISINOPRIL 40 MG PO TABS: 40.0000 mg | ORAL_TABLET | Freq: Every day | ORAL | 3 refills | Status: DC

## 2018-08-05 MED ORDER — TERAZOSIN HCL 5 MG PO CAPS: 5.0000 mg | ORAL_CAPSULE | Freq: Every day | ORAL | 3 refills | Status: DC

## 2018-08-05 NOTE — Progress Notes (Signed)
Subjective:    Patient ID: Devin Harmon, male    DOB: October 23, 1956, 62 y.o.   MRN: [id number]  HPI Here due to headache  "Things aren't working out too good" Brother helped with  his work--but having medical problem (had bad stroke and needs help eating, etc) He is convinced that he was on too many medications--and "they are working against each other"  He did try the terasozin after the last appointment But he stopped the lisinopril then  No chest pain "I'm just sick right now... I feel like I have the flu" Hot at times--no clear fever Some night cough No chills or sweats Goes back a month Headache mostly in evening---worst in left occiput. Tylenol does help the headache Current Outpatient Medications on File Prior to Visit  Medication Sig Dispense Refill  . lisinopril (PRINIVIL,ZESTRIL) 40 MG tablet Take 1 tablet (40 mg total) by mouth daily. (Patient not taking: Reported on 08/05/2018) 90 tablet 3  . terazosin (HYTRIN) 5 MG capsule Take 1 capsule (5 mg total) at bedtime by mouth. (Patient not taking: Reported on 08/05/2018) 30 capsule 11   No current facility-administered medications on file prior to visit.     Allergies  Allergen Reactions  . Chlorthalidone     REACTION: felt "bad" and ED  . Hydrocodone Other (See Comments)    Nausea and dizzy  . Sulfonamide Derivatives     REACTION: unspecified    Past Medical History:  Diagnosis Date  . Allergy   . BPH (benign prostatic hypertrophy)   . Hypertension   . Renal insufficiency   . Zoster 2007   facial, hospital 2007    History reviewed. No pertinent surgical history.  Family History  Problem Relation Age of Onset  . Hypertension Mother   . Cancer Mother        lung cancer  . Hypertension Brother   . Coronary artery disease Neg Hx   . Diabetes Neg Hx   . Heart disease Father     Social History   Socioeconomic History  . Marital status: Married    Spouse name: Not on file  . Number of children: 2  .  Years of education: Not on file  . Highest education level: Not on file  Occupational History  . Occupation: Museum/gallery curator, building new homes  Social Needs  . Financial resource strain: Not on file  . Food insecurity:    Worry: Not on file    Inability: Not on file  . Transportation needs:    Medical: Not on file    Non-medical: Not on file  Tobacco Use  . Smoking status: Never Smoker  . Smokeless tobacco: Never Used  Substance and Sexual Activity  . Alcohol use: Yes    Comment: occasional  . Drug use: Not on file  . Sexual activity: Not on file  Lifestyle  . Physical activity:    Days per week: Not on file    Minutes per session: Not on file  . Stress: Not on file  Relationships  . Social connections:    Talks on phone: Not on file    Gets together: Not on file    Attends religious service: Not on file    Active member of club or organization: Not on file    Attends meetings of clubs or organizations: Not on file    Relationship status: Not on file  . Intimate partner violence:    Fear of current or ex partner: Not on  file    Emotionally abused: Not on file    Physically abused: Not on file    Forced sexual activity: Not on file  Other Topics Concern  . Not on file  Social History Narrative  . Not on file   Review of Systems No muscle aches Appetite is okay Considering retirement    Objective:   Physical Exam  Constitutional: He appears well-developed. No distress.  Neck: No thyromegaly present.  Cardiovascular: Normal rate, regular rhythm and normal heart sounds. Exam reveals no gallop.  No murmur heard. Respiratory: Effort normal and breath sounds normal. No respiratory distress. He has no wheezes. He has no rales.  Musculoskeletal: He exhibits no edema.  Lymphadenopathy:    He has no cervical adenopathy.  Psychiatric:  fearful           Assessment & Plan:

## 2018-08-05 NOTE — Patient Instructions (Signed)
Please fill and start both blood pressure pills tonight (and take them every night). I will see you back in about 2 weeks

## 2018-08-05 NOTE — Assessment & Plan Note (Signed)
BP Readings from Last 3 Encounters:  08/05/18 (!) 230/120  10/07/17 (!) 196/98  09/04/17 (!) 200/110   Repeat 220/140 on right  He has fears and misconceptions Multiple intolerances to meds Scared now--especially due to his brother's stroke (but worries about polypharmacy also) Will restart lisinopril and terazosin (which he has seemed to tolerate) Start RN follow up 2 week appt

## 2018-08-16 ENCOUNTER — Ambulatory Visit: Payer: 59 | Admitting: Internal Medicine

## 2018-08-16 ENCOUNTER — Encounter: Payer: Self-pay | Admitting: Internal Medicine

## 2018-08-16 VITALS — BP 220/100 | HR 75 | Temp 98.4°F | Ht 68.0 in | Wt 204.0 lb

## 2018-08-16 DIAGNOSIS — I10 Essential (primary) hypertension: Secondary | ICD-10-CM | POA: Diagnosis not present

## 2018-08-16 NOTE — Assessment & Plan Note (Signed)
BP Readings from Last 3 Encounters:  08/16/18 (!) 220/100  08/05/18 (!) 230/120  10/07/17 (!) 196/98   Still having tolerance issues and he is not calling me to let me know when he changes things Asked him to try the terazosin in AM and lisinopril in PM Call if that doesn't work Consider cardiology evaluation

## 2018-08-16 NOTE — Patient Instructions (Signed)
Please take the pink pill in the morning and the white pill (lisinopril) in the evening.  Call if you are not able to take both of them every day. Come back in --in 2 weeks. We will consider a referral to a cardiologist.

## 2018-08-16 NOTE — Progress Notes (Signed)
Subjective:    Patient ID: Devin Harmon, male    DOB: 16-Dec-1955, 62 y.o.   MRN: [id number]  HPI Here for follow up of accelerated hypertension He took both the medications at bedtime---and gave him visual hallucinations Thinks on ceiling moving around Felt nauseated, "sick in my brain"  Now alternating them every other day Thinks the "pink one" is dong better than the "white" one (lisinopril)  No chest pain No SOB  Current Outpatient Medications on File Prior to Visit  Medication Sig Dispense Refill  . lisinopril (PRINIVIL,ZESTRIL) 40 MG tablet Take 1 tablet (40 mg total) by mouth daily. 90 tablet 3  . terazosin (HYTRIN) 5 MG capsule Take 1 capsule (5 mg total) by mouth at bedtime. 90 capsule 3   No current facility-administered medications on file prior to visit.     Allergies  Allergen Reactions  . Chlorthalidone     REACTION: felt "bad" and ED  . Hydrocodone Other (See Comments)    Nausea and dizzy  . Sulfonamide Derivatives     REACTION: unspecified    Past Medical History:  Diagnosis Date  . Allergy   . BPH (benign prostatic hypertrophy)   . Hypertension   . Renal insufficiency   . Zoster 2007   facial, hospital 2007    History reviewed. No pertinent surgical history.  Family History  Problem Relation Age of Onset  . Hypertension Mother   . Cancer Mother        lung cancer  . Hypertension Brother   . Coronary artery disease Neg Hx   . Diabetes Neg Hx   . Heart disease Father     Social History   Socioeconomic History  . Marital status: Married    Spouse name: Not on file  . Number of children: 2  . Years of education: Not on file  . Highest education level: Not on file  Occupational History  . Occupation: Museum/gallery curator, building new homes  Social Needs  . Financial resource strain: Not on file  . Food insecurity:    Worry: Not on file    Inability: Not on file  . Transportation needs:    Medical: Not on file    Non-medical: Not on file    Tobacco Use  . Smoking status: Never Smoker  . Smokeless tobacco: Never Used  Substance and Sexual Activity  . Alcohol use: Yes    Comment: occasional  . Drug use: Not on file  . Sexual activity: Not on file  Lifestyle  . Physical activity:    Days per week: Not on file    Minutes per session: Not on file  . Stress: Not on file  Relationships  . Social connections:    Talks on phone: Not on file    Gets together: Not on file    Attends religious service: Not on file    Active member of club or organization: Not on file    Attends meetings of clubs or organizations: Not on file    Relationship status: Not on file  . Intimate partner violence:    Fear of current or ex partner: Not on file    Emotionally abused: Not on file    Physically abused: Not on file    Forced sexual activity: Not on file  Other Topics Concern  . Not on file  Social History Narrative  . Not on file   Review of Systems  Is able to sleep --white one makes him sleep  Appetite is fine     Objective:   Physical Exam  Constitutional: He appears well-developed. No distress.  Neck: No thyromegaly present.  Cardiovascular: Normal rate, regular rhythm and normal heart sounds. Exam reveals no gallop.  No murmur heard. Respiratory: Breath sounds normal. No respiratory distress. He has no wheezes. He has no rales.  Musculoskeletal: He exhibits no edema.  Lymphadenopathy:    He has no cervical adenopathy.           Assessment & Plan:

## 2018-08-20 ENCOUNTER — Telehealth: Payer: Self-pay

## 2018-08-20 NOTE — Telephone Encounter (Signed)
LM (okay per DPR) for patient to please call me to provide an update on how he is doing with his bp management.  He was given my direct number to my office phone to promote quicker, easier follow up.  I will make another attempt again next week if I do not hear anything back from patient as we are concerned for him and his safety/compliance with medication.

## 2018-08-23 ENCOUNTER — Telehealth: Payer: Self-pay

## 2018-08-23 NOTE — Telephone Encounter (Signed)
Devin Harmon was trying to reach him- will cc to her   Let them know Dr Silvio Pate is out today but back tomorrow What does he meant by congestion ?  (nasal or chest, perhaps allergies) ? Any cough or fever?   Thanks

## 2018-08-23 NOTE — Telephone Encounter (Signed)
Patient presents with head and chest congestion with clear productive cough which started 2 weeks ago.  He has not been taking any cold medicine because afraid to take anything with blood pressure medications. He is up all night coughing, blowing nose "feels like he is strangling from mucous" and urinating.  He is exhausted because he is not sleeping.  Wife feels that he has had some "wheezing" and "windedness" after coughing. Patient concerned that he may have pneumonia.  Will discuss with Dr. Silvio Pate when he comes in in the am and see if he may want to see patient or prescribe further treatment.    Patient states that when he tried taking the terazosin last Monday, he had so much mucous that it made him throw up and so he stopped taking it.  He did start on the lisinopril and has been taking it every day except for last night because he has not been sleeping and was just too exhausted and feeling poorly.  Patient's wife instructed that if patient's breathing worsens or if he develops any progressing symptoms including chest discomfort that they should go on to the walk in clinic this pm or to the ER.  Otherwise, we will discuss with MD in the morning and call back. Wife thanks me for the call and verbalizes understanding.

## 2018-08-23 NOTE — Telephone Encounter (Signed)
Copied from [place] [id number]. Topic: Quick Communication - See Telephone Encounter >> Aug 23, 2018  9:11 AM Antonieta Iba C wrote: CRM for notification. See Telephone encounter for: 08/23/18.  Pt's spouse called in to make provider aware that pt has  been unable to take both bp medications due to him having a lot of congestion. Spouse says that the terazosin (HYTRIN) 5 MG capsule is causing nausea but spouse feels that it is due to pt congestion. Please assist.    CB: [phone] - Mrs. Schussler

## 2018-08-23 NOTE — Telephone Encounter (Signed)
Please see other phone encounter with full details.

## 2018-08-23 NOTE — Telephone Encounter (Signed)
Copied from [place] [id number]. Topic: General - Other >> Aug 23, 2018  4:25 PM Yvette Rack wrote: Reason for CRM: Pt spouse states pt is having chest congestion and she feels he needs some cough syrup or something for the congestion as pt is having trouble taking the prescribed bp medication. Cb# [phone]

## 2018-08-24 NOTE — Telephone Encounter (Signed)
Please check on him this morning I am not sure if he needs anything for the congestion, per se---and am not sure what else to try for the BP See if his symptoms are better and then I can review to see if there is anything else to try for the BP

## 2018-08-24 NOTE — Telephone Encounter (Signed)
Left message to call office

## 2018-08-27 ENCOUNTER — Encounter (HOSPITAL_COMMUNITY): Payer: Self-pay | Admitting: Urgent Care

## 2018-08-27 ENCOUNTER — Encounter (HOSPITAL_COMMUNITY): Payer: Self-pay | Admitting: Emergency Medicine

## 2018-08-27 ENCOUNTER — Ambulatory Visit (HOSPITAL_COMMUNITY)
Admission: EM | Admit: 2018-08-27 | Discharge: 2018-08-27 | Disposition: A | Discharge status: Home / Self Care | Payer: 59 | Source: Home / Self Care

## 2018-08-27 ENCOUNTER — Inpatient Hospital Stay (HOSPITAL_COMMUNITY)
Admission: EM | Admit: 2018-08-27 | Discharge: 2018-09-01 | DRG: 682 | Disposition: A | Discharge status: Home / Self Care | Payer: 59 | Attending: Family Medicine | Admitting: Family Medicine

## 2018-08-27 ENCOUNTER — Other Ambulatory Visit: Payer: Self-pay

## 2018-08-27 ENCOUNTER — Emergency Department (HOSPITAL_COMMUNITY): Payer: 59

## 2018-08-27 DIAGNOSIS — Z882 Allergy status to sulfonamides status: Secondary | ICD-10-CM

## 2018-08-27 DIAGNOSIS — I10 Essential (primary) hypertension: Secondary | ICD-10-CM

## 2018-08-27 DIAGNOSIS — R05 Cough: Secondary | ICD-10-CM | POA: Diagnosis not present

## 2018-08-27 DIAGNOSIS — N183 Chronic kidney disease, stage 3 (moderate): Secondary | ICD-10-CM | POA: Diagnosis present

## 2018-08-27 DIAGNOSIS — I214 Non-ST elevation (NSTEMI) myocardial infarction: Secondary | ICD-10-CM | POA: Diagnosis not present

## 2018-08-27 DIAGNOSIS — N170 Acute kidney failure with tubular necrosis: Secondary | ICD-10-CM | POA: Diagnosis not present

## 2018-08-27 DIAGNOSIS — E872 Acidosis: Secondary | ICD-10-CM | POA: Diagnosis present

## 2018-08-27 DIAGNOSIS — I161 Hypertensive emergency: Secondary | ICD-10-CM | POA: Diagnosis present

## 2018-08-27 DIAGNOSIS — Z885 Allergy status to narcotic agent status: Secondary | ICD-10-CM

## 2018-08-27 DIAGNOSIS — N179 Acute kidney failure, unspecified: Secondary | ICD-10-CM

## 2018-08-27 DIAGNOSIS — I129 Hypertensive chronic kidney disease with stage 1 through stage 4 chronic kidney disease, or unspecified chronic kidney disease: Secondary | ICD-10-CM | POA: Diagnosis present

## 2018-08-27 DIAGNOSIS — R338 Other retention of urine: Secondary | ICD-10-CM | POA: Diagnosis present

## 2018-08-27 DIAGNOSIS — N2581 Secondary hyperparathyroidism of renal origin: Secondary | ICD-10-CM | POA: Diagnosis present

## 2018-08-27 DIAGNOSIS — N185 Chronic kidney disease, stage 5: Secondary | ICD-10-CM

## 2018-08-27 DIAGNOSIS — I21A1 Myocardial infarction type 2: Secondary | ICD-10-CM | POA: Diagnosis present

## 2018-08-27 DIAGNOSIS — I160 Hypertensive urgency: Secondary | ICD-10-CM | POA: Diagnosis present

## 2018-08-27 DIAGNOSIS — E875 Hyperkalemia: Secondary | ICD-10-CM | POA: Diagnosis present

## 2018-08-27 DIAGNOSIS — Z66 Do not resuscitate: Secondary | ICD-10-CM | POA: Diagnosis present

## 2018-08-27 DIAGNOSIS — Z79899 Other long term (current) drug therapy: Secondary | ICD-10-CM

## 2018-08-27 DIAGNOSIS — Z9114 Patient's other noncompliance with medication regimen: Secondary | ICD-10-CM

## 2018-08-27 DIAGNOSIS — R531 Weakness: Secondary | ICD-10-CM | POA: Diagnosis not present

## 2018-08-27 DIAGNOSIS — N401 Enlarged prostate with lower urinary tract symptoms: Secondary | ICD-10-CM | POA: Diagnosis present

## 2018-08-27 DIAGNOSIS — Z515 Encounter for palliative care: Secondary | ICD-10-CM

## 2018-08-27 DIAGNOSIS — N186 End stage renal disease: Secondary | ICD-10-CM

## 2018-08-27 DIAGNOSIS — D631 Anemia in chronic kidney disease: Secondary | ICD-10-CM | POA: Diagnosis present

## 2018-08-27 DIAGNOSIS — G9341 Metabolic encephalopathy: Secondary | ICD-10-CM | POA: Diagnosis present

## 2018-08-27 DIAGNOSIS — Z888 Allergy status to other drugs, medicaments and biological substances status: Secondary | ICD-10-CM

## 2018-08-27 LAB — URINALYSIS, ROUTINE W REFLEX MICROSCOPIC
BILIRUBIN URINE: NEGATIVE
Glucose, UA: 50 mg/dL — AB
Ketones, ur: 5 mg/dL — AB
LEUKOCYTES UA: NEGATIVE
NITRITE: NEGATIVE
Protein, ur: >=300 mg/dL — AB
Specific Gravity, Urine: 1.011 (ref 1.005–1.030)
pH: 6 (ref 5.0–8.0)

## 2018-08-27 LAB — CBC
HEMATOCRIT: 37.3 % — AB (ref 39.0–52.0)
HEMOGLOBIN: 12.1 g/dL — AB (ref 13.0–17.0)
MCH: 28.6 pg (ref 26.0–34.0)
MCHC: 32.4 g/dL (ref 30.0–36.0)
MCV: 88.2 fL (ref 78.0–100.0)
Platelets: 283 10*3/uL (ref 150–400)
RBC: 4.23 MIL/uL (ref 4.22–5.81)
RDW: 13.3 % (ref 11.5–15.5)
WBC: 9.1 10*3/uL (ref 4.0–10.5)

## 2018-08-27 LAB — BASIC METABOLIC PANEL
ANION GAP: 15 (ref 5–15)
BUN: 85 mg/dL — AB (ref 8–23)
CHLORIDE: 105 mmol/L (ref 98–111)
CO2: 17 mmol/L — ABNORMAL LOW (ref 22–32)
Calcium: 8.4 mg/dL — ABNORMAL LOW (ref 8.9–10.3)
Creatinine, Ser: 11.33 mg/dL — ABNORMAL HIGH (ref 0.61–1.24)
GFR calc non Af Amer: 4 mL/min — ABNORMAL LOW (ref >60)
GFR, EST AFRICAN AMERICAN: 5 mL/min — AB (ref >60)
Glucose, Bld: 90 mg/dL (ref 70–99)
POTASSIUM: 6.4 mmol/L — AB (ref 3.5–5.1)
SODIUM: 137 mmol/L (ref 135–145)

## 2018-08-27 MED ORDER — INSULIN ASPART 100 UNIT/ML IV SOLN
10.0000 [IU] | Freq: Once | INTRAVENOUS | Status: DC
  Filled 2018-08-27: qty 0.1

## 2018-08-27 MED ORDER — CALCIUM GLUCONATE 10 % IV SOLN
1.0000 g | Freq: Once | INTRAVENOUS | Status: AC
  Administered 2018-08-28: 1 g via INTRAVENOUS
  Filled 2018-08-27: qty 10

## 2018-08-27 MED ORDER — ALBUTEROL SULFATE (2.5 MG/3ML) 0.083% IN NEBU
10.0000 mg | INHALATION_SOLUTION | Freq: Once | RESPIRATORY_TRACT | Status: AC
  Administered 2018-08-28: 10 mg via RESPIRATORY_TRACT
  Filled 2018-08-27: qty 12

## 2018-08-27 MED ORDER — DEXTROSE 50 % IV SOLN
1.0000 | Freq: Once | INTRAVENOUS | Status: AC
  Administered 2018-08-28: 50 mL via INTRAVENOUS
  Filled 2018-08-27: qty 50

## 2018-08-27 MED ORDER — SODIUM BICARBONATE 8.4 % IV SOLN
50.0000 meq | Freq: Once | INTRAVENOUS | Status: AC
  Administered 2018-08-28: 50 meq via INTRAVENOUS
  Filled 2018-08-27: qty 50

## 2018-08-27 NOTE — ED Triage Notes (Addendum)
Pt seen by PCP on 9/5 for headache and HTN.  Pt restarted on Lisinopril and Terazosin.   Seen again on 9/16 by PCP and reports nausea and vomiting and unable to take medication.  Reports productive cough with clear phlegm, nasal congestion, and generalized weakness x 1 week.  Spouse reports vomiting and diaphoresis last night.  Also reports "muscle spasms".  No arm drift or neuro deficits noted on triage exam.

## 2018-08-27 NOTE — Telephone Encounter (Signed)
Left message to call office, again. 

## 2018-08-27 NOTE — ED Triage Notes (Signed)
Pt states he has congestion and and vomiting up film.. This has been going on for a week.

## 2018-08-28 ENCOUNTER — Emergency Department (HOSPITAL_COMMUNITY): Payer: 59

## 2018-08-28 ENCOUNTER — Encounter (HOSPITAL_COMMUNITY): Payer: Self-pay | Admitting: Emergency Medicine

## 2018-08-28 DIAGNOSIS — R531 Weakness: Secondary | ICD-10-CM | POA: Diagnosis not present

## 2018-08-28 DIAGNOSIS — N401 Enlarged prostate with lower urinary tract symptoms: Secondary | ICD-10-CM | POA: Diagnosis present

## 2018-08-28 DIAGNOSIS — I10 Essential (primary) hypertension: Secondary | ICD-10-CM | POA: Diagnosis not present

## 2018-08-28 DIAGNOSIS — E872 Acidosis: Secondary | ICD-10-CM | POA: Diagnosis present

## 2018-08-28 DIAGNOSIS — Z66 Do not resuscitate: Secondary | ICD-10-CM | POA: Diagnosis present

## 2018-08-28 DIAGNOSIS — D631 Anemia in chronic kidney disease: Secondary | ICD-10-CM | POA: Diagnosis present

## 2018-08-28 DIAGNOSIS — E875 Hyperkalemia: Secondary | ICD-10-CM

## 2018-08-28 DIAGNOSIS — N2581 Secondary hyperparathyroidism of renal origin: Secondary | ICD-10-CM | POA: Diagnosis present

## 2018-08-28 DIAGNOSIS — N183 Chronic kidney disease, stage 3 (moderate): Secondary | ICD-10-CM | POA: Diagnosis present

## 2018-08-28 DIAGNOSIS — Z885 Allergy status to narcotic agent status: Secondary | ICD-10-CM | POA: Diagnosis not present

## 2018-08-28 DIAGNOSIS — G9341 Metabolic encephalopathy: Secondary | ICD-10-CM | POA: Diagnosis not present

## 2018-08-28 DIAGNOSIS — N185 Chronic kidney disease, stage 5: Secondary | ICD-10-CM

## 2018-08-28 DIAGNOSIS — I160 Hypertensive urgency: Secondary | ICD-10-CM | POA: Diagnosis not present

## 2018-08-28 DIAGNOSIS — N189 Chronic kidney disease, unspecified: Secondary | ICD-10-CM | POA: Diagnosis not present

## 2018-08-28 DIAGNOSIS — N186 End stage renal disease: Secondary | ICD-10-CM

## 2018-08-28 DIAGNOSIS — I5030 Unspecified diastolic (congestive) heart failure: Secondary | ICD-10-CM | POA: Diagnosis not present

## 2018-08-28 DIAGNOSIS — N19 Unspecified kidney failure: Secondary | ICD-10-CM | POA: Diagnosis not present

## 2018-08-28 DIAGNOSIS — D649 Anemia, unspecified: Secondary | ICD-10-CM | POA: Diagnosis not present

## 2018-08-28 DIAGNOSIS — I214 Non-ST elevation (NSTEMI) myocardial infarction: Secondary | ICD-10-CM | POA: Diagnosis not present

## 2018-08-28 DIAGNOSIS — I161 Hypertensive emergency: Secondary | ICD-10-CM | POA: Diagnosis not present

## 2018-08-28 DIAGNOSIS — Z9114 Patient's other noncompliance with medication regimen: Secondary | ICD-10-CM | POA: Diagnosis not present

## 2018-08-28 DIAGNOSIS — N170 Acute kidney failure with tubular necrosis: Secondary | ICD-10-CM | POA: Diagnosis not present

## 2018-08-28 DIAGNOSIS — R109 Unspecified abdominal pain: Secondary | ICD-10-CM | POA: Diagnosis not present

## 2018-08-28 DIAGNOSIS — Z79899 Other long term (current) drug therapy: Secondary | ICD-10-CM | POA: Diagnosis not present

## 2018-08-28 DIAGNOSIS — N289 Disorder of kidney and ureter, unspecified: Secondary | ICD-10-CM | POA: Diagnosis not present

## 2018-08-28 DIAGNOSIS — I129 Hypertensive chronic kidney disease with stage 1 through stage 4 chronic kidney disease, or unspecified chronic kidney disease: Secondary | ICD-10-CM | POA: Diagnosis present

## 2018-08-28 DIAGNOSIS — I21A1 Myocardial infarction type 2: Secondary | ICD-10-CM | POA: Diagnosis not present

## 2018-08-28 DIAGNOSIS — R338 Other retention of urine: Secondary | ICD-10-CM | POA: Diagnosis present

## 2018-08-28 DIAGNOSIS — Z515 Encounter for palliative care: Secondary | ICD-10-CM | POA: Diagnosis not present

## 2018-08-28 DIAGNOSIS — Z888 Allergy status to other drugs, medicaments and biological substances status: Secondary | ICD-10-CM | POA: Diagnosis not present

## 2018-08-28 DIAGNOSIS — Z882 Allergy status to sulfonamides status: Secondary | ICD-10-CM | POA: Diagnosis not present

## 2018-08-28 DIAGNOSIS — N179 Acute kidney failure, unspecified: Secondary | ICD-10-CM | POA: Diagnosis not present

## 2018-08-28 LAB — TROPONIN I
TROPONIN I: 1.52 ng/mL — AB (ref <0.03)
TROPONIN I: 1.42 ng/mL — AB (ref <0.03)
TROPONIN I: 1.22 ng/mL — AB (ref <0.03)

## 2018-08-28 LAB — I-STAT TROPONIN, ED: TROPONIN I, POC: 1.97 ng/mL — AB (ref 0.00–0.08)

## 2018-08-28 LAB — CBC WITH DIFFERENTIAL/PLATELET
Abs Immature Granulocytes: 0 10*3/uL (ref 0.0–0.1)
BASOS ABS: 0.1 10*3/uL (ref 0.0–0.1)
Basophils Relative: 1 %
EOS ABS: 0 10*3/uL (ref 0.0–0.7)
Eosinophils Relative: 1 %
HCT: 34.7 % — ABNORMAL LOW (ref 39.0–52.0)
Hemoglobin: 11.2 g/dL — ABNORMAL LOW (ref 13.0–17.0)
IMMATURE GRANULOCYTES: 1 %
LYMPHS ABS: 1.3 10*3/uL (ref 0.7–4.0)
Lymphocytes Relative: 16 %
MCH: 28.1 pg (ref 26.0–34.0)
MCHC: 32.3 g/dL (ref 30.0–36.0)
MCV: 87 fL (ref 78.0–100.0)
MONOS PCT: 6 %
Monocytes Absolute: 0.5 10*3/uL (ref 0.1–1.0)
NEUTROS ABS: 6.4 10*3/uL (ref 1.7–7.7)
NEUTROS PCT: 75 %
PLATELETS: 255 10*3/uL (ref 150–400)
RBC: 3.99 MIL/uL — ABNORMAL LOW (ref 4.22–5.81)
RDW: 13.2 % (ref 11.5–15.5)
WBC: 8.4 10*3/uL (ref 4.0–10.5)

## 2018-08-28 LAB — RAPID URINE DRUG SCREEN, HOSP PERFORMED
Amphetamines: NOT DETECTED
BARBITURATES: NOT DETECTED
Benzodiazepines: NOT DETECTED
Cocaine: NOT DETECTED
Opiates: NOT DETECTED
TETRAHYDROCANNABINOL: NOT DETECTED

## 2018-08-28 LAB — HEPARIN LEVEL (UNFRACTIONATED): HEPARIN UNFRACTIONATED: 0.3 [IU]/mL (ref 0.30–0.70)

## 2018-08-28 LAB — HEPATIC FUNCTION PANEL
ALK PHOS: 59 U/L (ref 38–126)
ALT: 13 U/L (ref 0–44)
AST: 18 U/L (ref 15–41)
Albumin: 3.9 g/dL (ref 3.5–5.0)
BILIRUBIN INDIRECT: 0.6 mg/dL (ref 0.3–0.9)
Bilirubin, Direct: 0.1 mg/dL (ref 0.0–0.2)
TOTAL PROTEIN: 6.8 g/dL (ref 6.5–8.1)
Total Bilirubin: 0.7 mg/dL (ref 0.3–1.2)

## 2018-08-28 LAB — BASIC METABOLIC PANEL
Anion gap: 16 — ABNORMAL HIGH (ref 5–15)
BUN: 88 mg/dL — AB (ref 8–23)
CO2: 18 mmol/L — ABNORMAL LOW (ref 22–32)
Calcium: 8.7 mg/dL — ABNORMAL LOW (ref 8.9–10.3)
Chloride: 105 mmol/L (ref 98–111)
Creatinine, Ser: 11.6 mg/dL — ABNORMAL HIGH (ref 0.61–1.24)
GFR calc Af Amer: 5 mL/min — ABNORMAL LOW (ref >60)
GFR, EST NON AFRICAN AMERICAN: 4 mL/min — AB (ref >60)
GLUCOSE: 89 mg/dL (ref 70–99)
POTASSIUM: 4.5 mmol/L (ref 3.5–5.1)
Sodium: 139 mmol/L (ref 135–145)

## 2018-08-28 LAB — HIV ANTIBODY (ROUTINE TESTING W REFLEX): HIV Screen 4th Generation wRfx: NONREACTIVE

## 2018-08-28 LAB — SODIUM, URINE, RANDOM: SODIUM UR: 77 mmol/L

## 2018-08-28 LAB — PHOSPHORUS: Phosphorus: 6.9 mg/dL — ABNORMAL HIGH (ref 2.5–4.6)

## 2018-08-28 LAB — GLUCOSE, CAPILLARY
GLUCOSE-CAPILLARY: 94 mg/dL (ref 70–99)
GLUCOSE-CAPILLARY: 106 mg/dL — AB (ref 70–99)
GLUCOSE-CAPILLARY: 115 mg/dL — AB (ref 70–99)
Glucose-Capillary: 104 mg/dL — ABNORMAL HIGH (ref 70–99)

## 2018-08-28 LAB — MAGNESIUM: Magnesium: 2 mg/dL (ref 1.7–2.4)

## 2018-08-28 LAB — TSH: TSH: 1.687 u[IU]/mL (ref 0.350–4.500)

## 2018-08-28 LAB — INFLUENZA PANEL BY PCR (TYPE A & B)
INFLBPCR: NEGATIVE
Influenza A By PCR: NEGATIVE

## 2018-08-28 LAB — CREATININE, URINE, RANDOM: Creatinine, Urine: 81.71 mg/dL

## 2018-08-28 MED ORDER — HYDRALAZINE HCL 20 MG/ML IJ SOLN
5.0000 mg | INTRAMUSCULAR | Status: DC | PRN
  Administered 2018-08-28 (×2): 5 mg via INTRAVENOUS
  Filled 2018-08-28 (×2): qty 1

## 2018-08-28 MED ORDER — ACETAMINOPHEN 650 MG RE SUPP: 650.0000 mg | Freq: Four times a day (QID) | RECTAL | Status: DC | PRN

## 2018-08-28 MED ORDER — FOLIC ACID 1 MG PO TABS
1.0000 mg | ORAL_TABLET | Freq: Every day | ORAL | Status: DC
  Administered 2018-08-28 – 2018-08-30 (×3): 1 mg via ORAL
  Filled 2018-08-28 (×4): qty 1

## 2018-08-28 MED ORDER — ONDANSETRON HCL 4 MG/2ML IJ SOLN: 4.0000 mg | Freq: Four times a day (QID) | INTRAMUSCULAR | Status: DC | PRN

## 2018-08-28 MED ORDER — HEPARIN (PORCINE) IN NACL 100-0.45 UNIT/ML-% IJ SOLN
1000.0000 [IU]/h | INTRAMUSCULAR | Status: DC
  Administered 2018-08-28: 1000 [IU]/h via INTRAVENOUS
  Filled 2018-08-28: qty 250

## 2018-08-28 MED ORDER — LORAZEPAM 1 MG PO TABS: 1.0000 mg | ORAL_TABLET | Freq: Four times a day (QID) | ORAL | Status: AC | PRN

## 2018-08-28 MED ORDER — TERAZOSIN HCL 5 MG PO CAPS
5.0000 mg | ORAL_CAPSULE | Freq: Every day | ORAL | Status: DC
  Administered 2018-08-28 – 2018-08-31 (×4): 5 mg via ORAL
  Filled 2018-08-28 (×4): qty 1

## 2018-08-28 MED ORDER — HYDRALAZINE HCL 20 MG/ML IJ SOLN
5.0000 mg | Freq: Once | INTRAMUSCULAR | Status: AC
  Administered 2018-08-28: 5 mg via INTRAVENOUS
  Filled 2018-08-28: qty 1

## 2018-08-28 MED ORDER — LORAZEPAM 2 MG/ML IJ SOLN: 1.0000 mg | Freq: Four times a day (QID) | INTRAMUSCULAR | Status: AC | PRN

## 2018-08-28 MED ORDER — HYDRALAZINE HCL 25 MG PO TABS
25.0000 mg | ORAL_TABLET | Freq: Three times a day (TID) | ORAL | Status: DC
  Administered 2018-08-28 – 2018-08-31 (×9): 25 mg via ORAL
  Filled 2018-08-28 (×10): qty 1

## 2018-08-28 MED ORDER — THIAMINE HCL 100 MG/ML IJ SOLN
100.0000 mg | Freq: Every day | INTRAMUSCULAR | Status: DC
  Filled 2018-08-28: qty 2

## 2018-08-28 MED ORDER — ONDANSETRON HCL 4 MG PO TABS: 4.0000 mg | ORAL_TABLET | Freq: Four times a day (QID) | ORAL | Status: DC | PRN

## 2018-08-28 MED ORDER — LABETALOL HCL 200 MG PO TABS
200.0000 mg | ORAL_TABLET | Freq: Two times a day (BID) | ORAL | Status: DC
  Administered 2018-08-28 – 2018-08-30 (×5): 200 mg via ORAL
  Filled 2018-08-28 (×6): qty 1

## 2018-08-28 MED ORDER — HEPARIN SODIUM (PORCINE) 5000 UNIT/ML IJ SOLN
5000.0000 [IU] | Freq: Three times a day (TID) | INTRAMUSCULAR | Status: DC
  Administered 2018-08-28 – 2018-09-01 (×11): 5000 [IU] via SUBCUTANEOUS
  Filled 2018-08-28 (×12): qty 1

## 2018-08-28 MED ORDER — METOPROLOL TARTRATE 25 MG PO TABS
25.0000 mg | ORAL_TABLET | Freq: Two times a day (BID) | ORAL | Status: DC
  Administered 2018-08-28: 25 mg via ORAL
  Filled 2018-08-28: qty 1

## 2018-08-28 MED ORDER — INSULIN ASPART 100 UNIT/ML ~~LOC~~ SOLN
10.0000 [IU] | Freq: Once | SUBCUTANEOUS | Status: AC
  Administered 2018-08-28: 10 [IU] via SUBCUTANEOUS
  Filled 2018-08-28: qty 1

## 2018-08-28 MED ORDER — HEPARIN BOLUS VIA INFUSION
4000.0000 [IU] | Freq: Once | INTRAVENOUS | Status: AC
  Administered 2018-08-28: 4000 [IU] via INTRAVENOUS
  Filled 2018-08-28: qty 4000

## 2018-08-28 MED ORDER — NITROGLYCERIN 2 % TD OINT
1.0000 [in_us] | TOPICAL_OINTMENT | Freq: Once | TRANSDERMAL | Status: AC
  Administered 2018-08-28: 1 [in_us] via TOPICAL
  Filled 2018-08-28: qty 1

## 2018-08-28 MED ORDER — VITAMIN B-1 100 MG PO TABS
100.0000 mg | ORAL_TABLET | Freq: Every day | ORAL | Status: DC
  Administered 2018-08-28 – 2018-09-01 (×5): 100 mg via ORAL
  Filled 2018-08-28 (×5): qty 1

## 2018-08-28 MED ORDER — SODIUM BICARBONATE 8.4 % IV SOLN
INTRAVENOUS | Status: DC
  Administered 2018-08-28 – 2018-08-31 (×5): via INTRAVENOUS
  Filled 2018-08-28 (×10): qty 150

## 2018-08-28 MED ORDER — ADULT MULTIVITAMIN W/MINERALS CH
1.0000 | ORAL_TABLET | Freq: Every day | ORAL | Status: DC
  Administered 2018-08-28 – 2018-09-01 (×5): 1 via ORAL
  Filled 2018-08-28 (×5): qty 1

## 2018-08-28 MED ORDER — ACETAMINOPHEN 325 MG PO TABS: 650.0000 mg | ORAL_TABLET | Freq: Four times a day (QID) | ORAL | Status: DC | PRN

## 2018-08-28 NOTE — ED Provider Notes (Addendum)
[facility] EMERGENCY DEPARTMENT Provider Note   CSN: [id number] Arrival date & time: 08/27/18  2031     History   Chief Complaint Chief Complaint  Patient presents with  . Cough  . Nasal Congestion  . Weakness  . Hypertension    HPI Devin Harmon is a 62 y.o. male.  The history is provided by the patient.  Cough  This is a chronic problem. The current episode started more than 1 week ago. The problem occurs constantly. The problem has not changed since onset.The cough is productive of sputum. There has been no fever. Pertinent negatives include no ear congestion, no ear pain and no sore throat. Associated symptoms comments: Nausea, vomiting and diaphoresis over 24 hours ago.  Feels globally weak on his BP medication started 9/6. He has tried nothing for the symptoms. The treatment provided no relief. He is not a smoker. His past medical history does not include asthma.  Hypertension  This is a chronic problem. The current episode started more than 1 week ago. The problem occurs constantly. The problem has been gradually worsening. Pertinent negatives include no abdominal pain. Nothing aggravates the symptoms. Nothing relieves the symptoms. Treatments tried: stopped taking his medications as they were making him sick   The treatment provided no relief.    Past Medical History:  Diagnosis Date  . Allergy   . BPH (benign prostatic hypertrophy)   . Hypertension   . Renal insufficiency   . Zoster 2007   facial, hospital 2007    Patient Active Problem List   Diagnosis Date Noted  . Hematoma of abdominal wall 10/07/2017  . Routine general medical examination at a health care facility 06/20/2011  . Malignant hypertension 02/28/2010  . ERECTILE DYSFUNCTION, ORGANIC 03/06/2008  . BENIGN PROSTATIC HYPERTROPHY 01/04/2008  . Essential hypertension, benign 08/19/2007  . Chronic kidney disease, stage III (moderate) ([place]) 08/19/2007    History reviewed. No  pertinent surgical history.      Home Medications    Prior to Admission medications   Medication Sig Start Date End Date Taking? Authorizing Provider  lisinopril (PRINIVIL,ZESTRIL) 40 MG tablet Take 1 tablet (40 mg total) by mouth daily. 08/05/18   Venia Carbon, MD  terazosin (HYTRIN) 5 MG capsule Take 1 capsule (5 mg total) by mouth at bedtime. 08/05/18   Venia Carbon, MD    Family History Family History  Problem Relation Age of Onset  . Hypertension Mother   . Cancer Mother        lung cancer  . Hypertension Brother   . Heart disease Father   . Coronary artery disease Neg Hx   . Diabetes Neg Hx     Social History Social History   Tobacco Use  . Smoking status: Never Smoker  . Smokeless tobacco: Never Used  Substance Use Topics  . Alcohol use: Yes    Comment: occasional  . Drug use: Not Currently     Allergies   Chlorthalidone; Hydrocodone; and Sulfonamide derivatives   Review of Systems Review of Systems  Constitutional: Positive for diaphoresis and fatigue. Negative for fever.  HENT: Positive for congestion. Negative for ear pain and sore throat.   Respiratory: Positive for cough.   Cardiovascular: Negative for palpitations and leg swelling.  Gastrointestinal: Positive for nausea. Negative for abdominal pain.  All other systems reviewed and are negative.    Physical Exam Updated Vital Signs BP (!) 228/116   Pulse 66   Temp 98.4 F (36.9  C)   Resp 20   Ht 5\' 9"  (1.753 m)   Wt 92.5 kg   SpO2 98%   BMI 30.13 kg/m   Physical Exam  Constitutional: He is oriented to person, place, and time. He appears well-developed and well-nourished.  HENT:  Head: Normocephalic and atraumatic.  Mouth/Throat: No oropharyngeal exudate.  Eyes: Pupils are equal, round, and reactive to light. Conjunctivae are normal.  Neck: Normal range of motion. Neck supple.  Cardiovascular: Normal rate, regular rhythm, normal heart sounds and intact distal pulses.    Pulmonary/Chest: Effort normal and breath sounds normal. No stridor. He has no wheezes. He has no rales.  Abdominal: Soft. Bowel sounds are normal. He exhibits no mass. There is no tenderness. There is no rebound and no guarding.  Musculoskeletal: Normal range of motion.  Neurological: He is alert and oriented to person, place, and time. He displays normal reflexes.  Skin: Skin is warm and dry. Capillary refill takes less than 2 seconds. He is not diaphoretic.  Psychiatric: He has a normal mood and affect.  Nursing note and vitals reviewed.    ED Treatments / Results  Labs (all labs ordered are listed, but only abnormal results are displayed) Results for orders placed or performed during the hospital encounter of 74/08/14  Basic metabolic panel  Result Value Ref Range   Sodium 137 135 - 145 mmol/L   Potassium 6.4 (HH) 3.5 - 5.1 mmol/L   Chloride 105 98 - 111 mmol/L   CO2 17 (L) 22 - 32 mmol/L   Glucose, Bld 90 70 - 99 mg/dL   BUN 85 (H) 8 - 23 mg/dL   Creatinine, Ser 11.33 (H) 0.61 - 1.24 mg/dL   Calcium 8.4 (L) 8.9 - 10.3 mg/dL   GFR calc non Af Amer 4 (L) >60 mL/min   GFR calc Af Amer 5 (L) >60 mL/min   Anion gap 15 5 - 15  CBC  Result Value Ref Range   WBC 9.1 4.0 - 10.5 K/uL   RBC 4.23 4.22 - 5.81 MIL/uL   Hemoglobin 12.1 (L) 13.0 - 17.0 g/dL   HCT 37.3 (L) 39.0 - 52.0 %   MCV 88.2 78.0 - 100.0 fL   MCH 28.6 26.0 - 34.0 pg   MCHC 32.4 30.0 - 36.0 g/dL   RDW 13.3 11.5 - 15.5 %   Platelets 283 150 - 400 K/uL  Urinalysis, Routine w reflex microscopic  Result Value Ref Range   Color, Urine STRAW (A) YELLOW   APPearance CLEAR CLEAR   Specific Gravity, Urine 1.011 1.005 - 1.030   pH 6.0 5.0 - 8.0   Glucose, UA 50 (A) NEGATIVE mg/dL   Hgb urine dipstick SMALL (A) NEGATIVE   Bilirubin Urine NEGATIVE NEGATIVE   Ketones, ur 5 (A) NEGATIVE mg/dL   Protein, ur >=300 (A) NEGATIVE mg/dL   Nitrite NEGATIVE NEGATIVE   Leukocytes, UA NEGATIVE NEGATIVE   RBC / HPF 0-5 0 - 5  RBC/hpf   WBC, UA 0-5 0 - 5 WBC/hpf   Bacteria, UA RARE (A) NONE SEEN   Squamous Epithelial / LPF 0-5 0 - 5  I-Stat Troponin, ED (not at [facility])  Result Value Ref Range   Troponin i, poc 1.97 (HH) 0.00 - 0.08 ng/mL   Comment NOTIFIED PHYSICIAN    Comment 3           Dg Chest 2 View  Result Date: 08/27/2018 CLINICAL DATA:  62 year old male with cough EXAM: CHEST - 2 VIEW COMPARISON:  None. FINDINGS: The lungs are clear. There is no pleural effusion or pneumothorax. Borderline cardiomegaly. No acute osseous pathology. IMPRESSION: No active cardiopulmonary disease. Electronically Signed   By: Anner Crete M.D.   On: 08/27/2018 23:44   Ct Renal Stone Study  Result Date: 08/28/2018 CLINICAL DATA:  62 year old male with abdominal pain. EXAM: CT ABDOMEN AND PELVIS WITHOUT CONTRAST TECHNIQUE: Multidetector CT imaging of the abdomen and pelvis was performed following the standard protocol without IV contrast. COMPARISON:  None. FINDINGS: Evaluation of this exam is limited in the absence of intravenous contrast. Evaluation is also limited due to respiratory motion artifact. Lower chest: The visualized lung bases are clear. There is cardiomegaly and coronary vascular calcification. No intra-abdominal free air or free fluid. Hepatobiliary: No focal liver abnormality is seen. No gallstones, gallbladder wall thickening, or biliary dilatation. Pancreas: Unremarkable. No pancreatic ductal dilatation or surrounding inflammatory changes. Spleen: Normal in size without focal abnormality. Adrenals/Urinary Tract: Mild bilateral adrenal thickening. There is a subcentimeter exophytic lesion from the anterolateral aspect of the right kidney which is too small to characterize. There is no hydronephrosis or nephrolithiasis on either side. The visualized ureters and urinary bladder appear unremarkable. Stomach/Bowel: There is no bowel obstruction or active inflammation. Normal appendix. Vascular/Lymphatic: Mild aortoiliac  atherosclerotic disease. No portal venous gas. There is no adenopathy. Reproductive: Prostate and seminal vesicles are grossly unremarkable. No pelvic mass. Other: Small fat containing umbilical hernia. Musculoskeletal: Degenerative changes of the lower lumbar spine. No acute osseous pathology. IMPRESSION: No acute intra-abdominopelvic pathology. No hydronephrosis or nephrolithiasis. Electronically Signed   By: Anner Crete M.D.   On: 08/28/2018 01:19    EKG EKG Interpretation  Date/Time:  Friday August 27 2018 21:20:09 EDT Ventricular Rate:  70 PR Interval:  172 QRS Duration: 88 QT Interval:  426 QTC Calculation: 460 R Axis:   -89 Text Interpretation:  Normal sinus rhythm Possible Left atrial enlargement Left axis deviation Confirmed by Randal Buba, Jahlia Omura ([id number]) on 08/27/2018 11:02:56 PM   Radiology Dg Chest 2 View  Result Date: 08/27/2018 CLINICAL DATA:  62 year old male with cough EXAM: CHEST - 2 VIEW COMPARISON:  None. FINDINGS: The lungs are clear. There is no pleural effusion or pneumothorax. Borderline cardiomegaly. No acute osseous pathology. IMPRESSION: No active cardiopulmonary disease. Electronically Signed   By: Anner Crete M.D.   On: 08/27/2018 23:44    Procedures Procedures (including critical care time)  Medications Ordered in ED Medications  sodium bicarbonate injection 50 mEq (has no administration in time range)  insulin aspart (novoLOG) injection 10 Units (has no administration in time range)    And  dextrose 50 % solution 50 mL (has no administration in time range)  calcium gluconate inj 10% (1 g) URGENT USE ONLY! (has no administration in time range)  sodium bicarbonate 150 mEq in dextrose 5 % 1,000 mL infusion (has no administration in time range)  hydrALAZINE (APRESOLINE) injection 5 mg (has no administration in time range)  heparin ADULT infusion 100 units/mL (25000 units/252mL sodium chloride 0.45%) (has no administration in time range)  heparin  bolus via infusion 4,000 Units (has no administration in time range)  albuterol (PROVENTIL) (2.5 MG/3ML) 0.083% nebulizer solution 10 mg (10 mg Nebulization Given 08/28/18 0101)    MDM Reviewed: vitals and nursing note Reviewed previous: labs Interpretation: labs, ECG and x-ray (no edema on Xray by me, hyperkalemia and acute renal failure) Total time providing critical care: > 105 minutes (treatment of NSTEMI and hyperkalemia). This excludes time spent performing separately  reportable procedures and services. Consults: admitting MD and cardiology (nephrology, Dr. Carolin Sicks start bicarb drip and get non contrast CT renal will consult.  Cardiology start heparin, cardiology will consult )  CRITICAL CARE Performed by: Carlisle Beers Total critical care time: 120 minutes Critical care time was exclusive of separately billable procedures and treating other patients. Critical care was necessary to treat or prevent imminent or life-threatening deterioration. Critical care was time spent personally by me on the following activities: development of treatment plan with patient and/or surrogate as well as nursing, discussions with consultants, evaluation of patient's response to treatment, examination of patient, obtaining history from patient or surrogate, ordering and performing treatments and interventions, ordering and review of laboratory studies, ordering and review of radiographic studies, pulse oximetry and re-evaluation of patient's condition.   Final Clinical Impressions(s) / ED Diagnoses   Will admit to medicine with cardiology and nephrology consults.  Heparin drip and hyperkalemia protocol initiated by me.     Klint Lezcano, MD 08/28/18 Jonne Ply, Jasper Hanf, MD 09/06/18 [id number]

## 2018-08-28 NOTE — Consult Note (Signed)
Cardiology Consultation Note    Patient ID: [name], MRN: [id number], DOB/AGE: 1956/11/03 62 y.o. Admit date: 08/27/2018   Date of Consult: 08/28/2018 Primary Physician: Venia Carbon, MD  Reason for Consultation: Elevated troponin Requesting MD: Dr. Randal Buba  HPI: Devin Harmon is a 62 y.o. male with a history of chronic kidney disease, hypertension (recent diagnosis), who presented initially for evaluation of nasal/chest congestion and generalized weakness.  The patient has been having ongoing issues with upper respiratory congestion for over the past week; he is also had progressive nausea and generalized fatigue.  In particular, he denied any chest pain, shortness of breath, fluid retention, palpitations, presyncope, or syncope.  Given his progressive congestion, he presented to the emergency department for evaluation.  In the ED, he was markedly hypertensive with systolics as high as 643P, and the remainder of the vital signs were within normal limits his labs were notable for creatinine of 11.3 (up from 2.1 little over a year ago), K of 6.4, bicarbonate of 17, BUN of 85.  His initial troponin was 1.97.  Of note, he was recently started on lisinopril for hypertension.  Upon my interview, the patient is comfortable and denies any acute complaints.    Past Medical History:  Diagnosis Date  . Allergy   . BPH (benign prostatic hypertrophy)   . Hypertension   . Renal insufficiency   . Zoster 2007   facial, hospital 2007      Surgical History: History reviewed. No pertinent surgical history.   Home Meds: Prior to Admission medications   Medication Sig Start Date End Date Taking? Authorizing Provider  acetaminophen (TYLENOL) 500 MG tablet Take 500-1,000 mg by mouth every 6 (six) hours as needed for mild pain.   Yes [provider]  lisinopril (PRINIVIL,ZESTRIL) 40 MG tablet Take 1 tablet (40 mg total) by mouth daily. 08/05/18  Yes Venia Carbon, MD  terazosin  (HYTRIN) 5 MG capsule Take 1 capsule (5 mg total) by mouth at bedtime. 08/05/18  Yes Venia Carbon, MD    Inpatient Medications:  . calcium gluconate  1 g Intravenous Once  . insulin aspart  10 Units Intravenous Once   And  . dextrose  1 ampule Intravenous Once  . heparin  4,000 Units Intravenous Once  . hydrALAZINE  5 mg Intravenous Once  . sodium bicarbonate  50 mEq Intravenous Once   . heparin    .  sodium bicarbonate  infusion 1000 mL      Allergies:  Allergies  Allergen Reactions  . Chlorthalidone     REACTION: felt "bad" and ED  . Hydrocodone Other (See Comments)    Nausea and dizzy  . Sulfonamide Derivatives     REACTION: unspecified    Social History   Socioeconomic History  . Marital status: Married    Spouse name: Not on file  . Number of children: 2  . Years of education: Not on file  . Highest education level: Not on file  Occupational History  . Occupation: Museum/gallery curator, building new homes  Social Needs  . Financial resource strain: Not on file  . Food insecurity:    Worry: Not on file    Inability: Not on file  . Transportation needs:    Medical: Not on file    Non-medical: Not on file  Tobacco Use  . Smoking status: Never Smoker  . Smokeless tobacco: Never Used  Substance and Sexual Activity  . Alcohol use: Yes  Comment: occasional  . Drug use: Not Currently  . Sexual activity: Not on file  Lifestyle  . Physical activity:    Days per week: Not on file    Minutes per session: Not on file  . Stress: Not on file  Relationships  . Social connections:    Talks on phone: Not on file    Gets together: Not on file    Attends religious service: Not on file    Active member of club or organization: Not on file    Attends meetings of clubs or organizations: Not on file    Relationship status: Not on file  . Intimate partner violence:    Fear of current or ex partner: Not on file    Emotionally abused: Not on file    Physically abused: Not on  file    Forced sexual activity: Not on file  Other Topics Concern  . Not on file  Social History Narrative  . Not on file     Family History  Problem Relation Age of Onset  . Hypertension Mother   . Cancer Mother        lung cancer  . Hypertension Brother   . Heart disease Father   . Coronary artery disease Neg Hx   . Diabetes Neg Hx      Review of Systems: All other systems reviewed and are otherwise negative except as noted above.  Labs: No results for input(s): CKTOTAL, CKMB, TROPONINI in the last 72 hours. Lab Results  Component Value Date   WBC 9.1 08/27/2018   HGB 12.1 (L) 08/27/2018   HCT 37.3 (L) 08/27/2018   MCV 88.2 08/27/2018   PLT 283 08/27/2018    Recent Labs  Lab 08/27/18 2132  NA 137  K 6.4*  CL 105  CO2 17*  BUN 85*  CREATININE 11.33*  CALCIUM 8.4*  GLUCOSE 90   Lab Results  Component Value Date   CHOL 189 05/14/2016   HDL 48.80 05/14/2016   LDLCALC 116 (H) 05/14/2016   TRIG 123.0 05/14/2016   No results found for: DDIMER  Radiology/Studies:  Dg Chest 2 View  Result Date: 08/27/2018 CLINICAL DATA:  62 year old male with cough EXAM: CHEST - 2 VIEW COMPARISON:  None. FINDINGS: The lungs are clear. There is no pleural effusion or pneumothorax. Borderline cardiomegaly. No acute osseous pathology. IMPRESSION: No active cardiopulmonary disease. Electronically Signed   By: Anner Crete M.D.   On: 08/27/2018 23:44   Ct Renal Stone Study  Result Date: 08/28/2018 CLINICAL DATA:  62 year old male with abdominal pain. EXAM: CT ABDOMEN AND PELVIS WITHOUT CONTRAST TECHNIQUE: Multidetector CT imaging of the abdomen and pelvis was performed following the standard protocol without IV contrast. COMPARISON:  None. FINDINGS: Evaluation of this exam is limited in the absence of intravenous contrast. Evaluation is also limited due to respiratory motion artifact. Lower chest: The visualized lung bases are clear. There is cardiomegaly and coronary vascular  calcification. No intra-abdominal free air or free fluid. Hepatobiliary: No focal liver abnormality is seen. No gallstones, gallbladder wall thickening, or biliary dilatation. Pancreas: Unremarkable. No pancreatic ductal dilatation or surrounding inflammatory changes. Spleen: Normal in size without focal abnormality. Adrenals/Urinary Tract: Mild bilateral adrenal thickening. There is a subcentimeter exophytic lesion from the anterolateral aspect of the right kidney which is too small to characterize. There is no hydronephrosis or nephrolithiasis on either side. The visualized ureters and urinary bladder appear unremarkable. Stomach/Bowel: There is no bowel obstruction or active inflammation. Normal appendix.  Vascular/Lymphatic: Mild aortoiliac atherosclerotic disease. No portal venous gas. There is no adenopathy. Reproductive: Prostate and seminal vesicles are grossly unremarkable. No pelvic mass. Other: Small fat containing umbilical hernia. Musculoskeletal: Degenerative changes of the lower lumbar spine. No acute osseous pathology. IMPRESSION: No acute intra-abdominopelvic pathology. No hydronephrosis or nephrolithiasis. Electronically Signed   By: Anner Crete M.D.   On: 08/28/2018 01:19    Wt Readings from Last 3 Encounters:  08/27/18 92.5 kg  08/16/18 92.5 kg  08/05/18 92.1 kg    EKG: NSR, left axis deviation.  Physical Exam: Blood pressure (!) 244/127, pulse 76, temperature 98.4 F (36.9 C), resp. rate 16, height 5\' 9"  (1.753 m), weight 92.5 kg, SpO2 98 %. Body mass index is 30.13 kg/m. General: Well developed, well nourished, in no acute distress. Head: Normocephalic, atraumatic, sclera non-icteric, no xanthomas, nares are without discharge.  Lungs: Clear bilaterally to auscultation without wheezes, rales, or rhonchi. Breathing is unlabored. Heart: RRR with S1 S2. No murmurs, rubs, or gallops appreciated. Abdomen: Soft, non-tender, non-distended with normoactive bowel sounds. No  hepatomegaly. No rebound/guarding. No obvious abdominal masses. Msk:  Strength and tone appear normal for age. Extremities: No clubbing or cyanosis. No edema.  Distal pedal pulses are 2+ and equal bilaterally. Neuro: Alert and oriented X 3. No facial asymmetry. No focal deficit. Moves all extremities spontaneously. Psych:  Responds to questions appropriately with a normal affect.     Assessment and Plan  62 year old man who presents with acute on chronic kidney injury and hypertensive emergency.  In this context, he has an elevated troponin and this most likely represents a type II event.  Fortunately, he does not have ischemic symptoms at this time.  While cycling cardiac enzymes, it is not unreasonable to start IV heparin drip without bolus.  However, given the clinical context, acute plaque rupture physiology is quite unlikely.  Would plan for echocardiogram in the morning to look for any wall motion abnormalities.  Given his profound AKI after starting lisinopril, would consider renal ultrasound with Doppler to evaluate for possible bilateral renal artery stenosis.  We will follow along with you.  Signed, Doylene Canning, MD 08/28/2018, 1:55 AM

## 2018-08-28 NOTE — ED Notes (Signed)
Iv team here 

## 2018-08-28 NOTE — Progress Notes (Signed)
[place] for Heparin  Indication: chest pain/ACS  Allergies  Allergen Reactions  . Chlorthalidone     REACTION: felt "bad" and ED  . Hydrocodone Other (See Comments)    Nausea and dizzy  . Sulfonamide Derivatives     REACTION: unspecified    Patient Measurements: Height: 5\' 8"  (172.7 cm) Weight: 192 lb 10.9 oz (87.4 kg) IBW/kg (Calculated) : 68.4  Vital Signs: Temp: 99.2 F (37.3 C) (09/28 0816) Temp Source: Oral (09/28 0816) BP: 185/121 (09/28 0816) Pulse Rate: 106 (09/28 0816)  Labs: Recent Labs    08/27/18 2132 08/28/18 0448 08/28/18 0919  HGB 12.1* 11.2*  --   HCT 37.3* 34.7*  --   PLT 283 255  --   HEPARINUNFRC  --   --  0.30  CREATININE 11.33* 11.60*  --   TROPONINI  --  1.52* 1.42*    Estimated Creatinine Clearance: 7 mL/min (A) (by C-G formula based on SCr of 11.6 mg/dL (H)).   Medical History: Past Medical History:  Diagnosis Date  . Allergy   . BPH (benign prostatic hypertrophy)   . Hypertension   . Renal insufficiency   . Zoster 2007   facial, hospital 2007   Assessment: 62 y/o M presents to the ED with congestion and vomiting, pt is in acute on chronic renal failure with elevated troponin. Plans for Echo -heparin level is at goal, CBC stable  Goal of Therapy:  Heparin level 0.3-0.7 units/ml Monitor platelets by anticoagulation protocol: Yes   Plan:  -No heparin changes needed -Will confirm heparin level later today -Daily heparin level and CBC  Hildred Laser, PharmD Clinical Pharmacist Please check Amion for pharmacy contact number

## 2018-08-28 NOTE — ED Notes (Signed)
meds given pt voided 259ml urine

## 2018-08-28 NOTE — Consult Note (Signed)
Referring Provider: No ref. provider found Primary Care Physician:  Venia Carbon, MD Primary Nephrologist:  Dr.  Candiss Norse  Reason for Consultation: This is a 62 year old gentleman noncompliant hypertensive comes in with severe hypertension greater than 841 mm systolic.  He had a previous creatinine 2.1 measured in August 2019 that do not appear to be any other measurements.  He has not followed up with his nephrologist.  HPI: This is a very pleasant 62 year old gentleman who unfortunately is very noncompliant with control of his hypertension.  Was seen by his primary care physician approximately a week ago and was placed on antihypertensive medications, lisinopril 40 mg daily.  It does not appear that any labs were done at that time.  According to his wife who is in the room with him today he has been feeling unwell for about a month.  He has been listless and has had low energy levels.  He was found to have severe hypertension in the emergency room and elevated creatinine of 11.3 creatinine drawn in August 2018 revealed a creatinine of 2.16.  Serologies including urine protein electrophoresis serum protein extra pheresis catecholamines and metanephrines have been ordered by his primary service.  CT scan showed marked bilateral adrenal thickening with a subcentimeter exophytic lesion noted in the anterior lateral aspect of the right kidney with no evidence of hydronephrosis or nephrolithiasis  Blood pressure 214/125 pulse 92 temperature 99.2.  Bladder scan revealing 391 cc of urine O2 sats 96% room air  Most recent labs sodium 139 potassium 4.5 chloride 105 CO2 18 BUN 88 creatinine 1.6 calcium 8.7 phosphorus 6.9 magnesium 2.0 liver enzymes within normal range hemoglobin 11.2 WBC 8.4 platelets 255   Past Medical History:  Diagnosis Date  . Allergy   . BPH (benign prostatic hypertrophy)   . Hypertension   . Renal insufficiency   . Zoster 2007   facial, hospital 2007    History reviewed. No  pertinent surgical history.  Prior to Admission medications   Medication Sig Start Date End Date Taking? Authorizing Provider  acetaminophen (TYLENOL) 500 MG tablet Take 500-1,000 mg by mouth every 6 (six) hours as needed for mild pain.   Yes [provider]  lisinopril (PRINIVIL,ZESTRIL) 40 MG tablet Take 1 tablet (40 mg total) by mouth daily. 08/05/18  Yes Venia Carbon, MD  terazosin (HYTRIN) 5 MG capsule Take 1 capsule (5 mg total) by mouth at bedtime. 08/05/18  Yes Venia Carbon, MD    Current Facility-Administered Medications  Medication Dose Route Frequency Provider Last Rate Last Dose  . acetaminophen (TYLENOL) tablet 650 mg  650 mg Oral Q6H PRN Rise Patience, MD       Or  . acetaminophen (TYLENOL) suppository 650 mg  650 mg Rectal Q6H PRN Rise Patience, MD      . hydrALAZINE (APRESOLINE) injection 5 mg  5 mg Intravenous Q4H PRN Rise Patience, MD   5 mg at 08/28/18 1146  . hydrALAZINE (APRESOLINE) tablet 25 mg  25 mg Oral Q8H Rise Patience, MD   25 mg at 08/28/18 0454  . metoprolol tartrate (LOPRESSOR) tablet 25 mg  25 mg Oral BID Rai, Ripudeep K, MD   25 mg at 08/28/18 1149  . ondansetron (ZOFRAN) tablet 4 mg  4 mg Oral Q6H PRN Rise Patience, MD       Or  . ondansetron [facility]) injection 4 mg  4 mg Intravenous Q6H PRN Rise Patience, MD      .  sodium bicarbonate 150 mEq in dextrose 5 % 1,000 mL infusion   Intravenous Continuous Rise Patience, MD 50 mL/hr at 08/28/18 0240    . terazosin (HYTRIN) capsule 5 mg  5 mg Oral QHS Rise Patience, MD        Allergies as of 08/27/2018 - Review Complete 08/27/2018  Allergen Reaction Noted  . Chlorthalidone  05/09/2008  . Hydrocodone Other (See Comments) 08/15/2014  . Sulfonamide derivatives  02/01/2007    Family History  Problem Relation Age of Onset  . Hypertension Mother   . Cancer Mother        lung cancer  . Hypertension Brother   . Heart disease Father   .  Coronary artery disease Neg Hx   . Diabetes Neg Hx     Social History   Socioeconomic History  . Marital status: Married    Spouse name: Not on file  . Number of children: 2  . Years of education: Not on file  . Highest education level: Not on file  Occupational History  . Occupation: Museum/gallery curator, building new homes  Social Needs  . Financial resource strain: Not on file  . Food insecurity:    Worry: Not on file    Inability: Not on file  . Transportation needs:    Medical: Not on file    Non-medical: Not on file  Tobacco Use  . Smoking status: Never Smoker  . Smokeless tobacco: Never Used  Substance and Sexual Activity  . Alcohol use: Yes    Comment: occasional  . Drug use: Not Currently  . Sexual activity: Not on file  Lifestyle  . Physical activity:    Days per week: Not on file    Minutes per session: Not on file  . Stress: Not on file  Relationships  . Social connections:    Talks on phone: Not on file    Gets together: Not on file    Attends religious service: Not on file    Active member of club or organization: Not on file    Attends meetings of clubs or organizations: Not on file    Relationship status: Not on file  . Intimate partner violence:    Fear of current or ex partner: Not on file    Emotionally abused: Not on file    Physically abused: Not on file    Forced sexual activity: Not on file  Other Topics Concern  . Not on file  Social History Narrative  . Not on file    Review of Systems: Gen: Mildly elevated temperature no sweats no chills complains of some congestion.  No expectorated sputum HEENT: No visual complaints, No history of Retinopathy. Normal external appearance No Epistaxis or Sore throat. No sinusitis.   CV: Denies chest pain, angina, palpitations, syncope, orthopnea, PND, peripheral edema, and claudication. Resp: Denies dyspnea at rest, dyspnea with exercise, cough, sputum, wheezing, coughing up blood, and pleurisy. GI: Denies  vomiting blood, jaundice, and fecal incontinence.   Denies dysphagia or odynophagia. GU : Denies urinary burning, blood in urine, urinary frequency, urinary hesitancy, nocturnal urination, and urinary incontinence.  No renal calculi. MS: Admits to weakness but denies pain Derm: Denies rash, itching, dry skin, hives, moles, warts, or unhealing ulcers.  Psych: Denies depression, anxiety, memory loss, suicidal ideation, hallucinations, paranoia, and confusion. Heme: Denies bruising, bleeding, and enlarged lymph nodes. Neuro: No headache.  No diplopia. No dysarthria.  No dysphasia.  No history of CVA.  No Seizures. No paresthesias.  No weakness. Endocrine No DM.  No Thyroid disease.  No Adrenal disease.  Physical Exam: Vital signs in last 24 hours: Temp:  [98 F (36.7 C)-99.2 F (37.3 C)] 99.2 F (37.3 C) (09/28 1142) Pulse Rate:  [63-108] 92 (09/28 1146) Resp:  [16-30] 27 (09/28 0330) BP: (183-251)/(111-137) 214/125 (09/28 1142) SpO2:  [96 %-100 %] 97 % (09/28 1142) Weight:  [87.4 kg-92.5 kg] 87.4 kg (09/28 0352) Last BM Date: 08/27/18 General: Ill-appearing gentleman nondistressed Head:  Normocephalic and atraumatic. Eyes:  Sclera clear, no icterus.   Conjunctiva pink. Ears:  Normal auditory acuity. Nose:  No deformity, discharge,  or lesions. Mouth:  No deformity or lesions, dentition normal. Neck:  Supple; no masses or thyromegaly. JVP not elevated Lungs:  Clear throughout to auscultation.   No wheezes, crackles, or rhonchi. No acute distress. Heart:  Regular rate and rhythm; no murmurs, clicks, rubs,  or gallops. Abdomen:  Soft, nontender and nondistended. No masses, hepatosplenomegaly or hernias noted. Normal bowel sounds, without guarding, and without rebound.   Msk:  Symmetrical without gross deformities. Normal posture. Pulses:  No carotid, renal, femoral bruits. DP and PT symmetrical and equal Extremities:  Without clubbing or edema. Neurologic:  Alert and  oriented x4;   grossly normal neurologically. Skin:  Intact without significant lesions or rashes.   Intake/Output from previous day: 09/27 0701 - 09/28 0700 In: -  Out: 200 [Urine:200] Intake/Output this shift: No intake/output data recorded.  Lab Results: Recent Labs    08/27/18 2132 08/28/18 0448  WBC 9.1 8.4  HGB 12.1* 11.2*  HCT 37.3* 34.7*  PLT 283 255   BMET Recent Labs    08/27/18 2132 08/28/18 0448  NA 137 139  K 6.4* 4.5  CL 105 105  CO2 17* 18*  GLUCOSE 90 89  BUN 85* 88*  CREATININE 11.33* 11.60*  CALCIUM 8.4* 8.7*  PHOS  --  6.9*   LFT Recent Labs    08/28/18 0448  PROT 6.8  ALBUMIN 3.9  AST 18  ALT 13  ALKPHOS 59  BILITOT 0.7  BILIDIR 0.1  IBILI 0.6   PT/INR No results for input(s): LABPROT, INR in the last 72 hours. Hepatitis Panel No results for input(s): HEPBSAG, HCVAB, HEPAIGM, HEPBIGM in the last 72 hours.  Studies/Results: Dg Chest 2 View  Result Date: 08/27/2018 CLINICAL DATA:  62 year old male with cough EXAM: CHEST - 2 VIEW COMPARISON:  None. FINDINGS: The lungs are clear. There is no pleural effusion or pneumothorax. Borderline cardiomegaly. No acute osseous pathology. IMPRESSION: No active cardiopulmonary disease. Electronically Signed   By: Anner Crete M.D.   On: 08/27/2018 23:44   Ct Renal Stone Study  Result Date: 08/28/2018 CLINICAL DATA:  62 year old male with abdominal pain. EXAM: CT ABDOMEN AND PELVIS WITHOUT CONTRAST TECHNIQUE: Multidetector CT imaging of the abdomen and pelvis was performed following the standard protocol without IV contrast. COMPARISON:  None. FINDINGS: Evaluation of this exam is limited in the absence of intravenous contrast. Evaluation is also limited due to respiratory motion artifact. Lower chest: The visualized lung bases are clear. There is cardiomegaly and coronary vascular calcification. No intra-abdominal free air or free fluid. Hepatobiliary: No focal liver abnormality is seen. No gallstones, gallbladder  wall thickening, or biliary dilatation. Pancreas: Unremarkable. No pancreatic ductal dilatation or surrounding inflammatory changes. Spleen: Normal in size without focal abnormality. Adrenals/Urinary Tract: Mild bilateral adrenal thickening. There is a subcentimeter exophytic lesion from the anterolateral aspect of the right kidney which is too small to  characterize. There is no hydronephrosis or nephrolithiasis on either side. The visualized ureters and urinary bladder appear unremarkable. Stomach/Bowel: There is no bowel obstruction or active inflammation. Normal appendix. Vascular/Lymphatic: Mild aortoiliac atherosclerotic disease. No portal venous gas. There is no adenopathy. Reproductive: Prostate and seminal vesicles are grossly unremarkable. No pelvic mass. Other: Small fat containing umbilical hernia. Musculoskeletal: Degenerative changes of the lower lumbar spine. No acute osseous pathology. IMPRESSION: No acute intra-abdominopelvic pathology. No hydronephrosis or nephrolithiasis. Electronically Signed   By: Anner Crete M.D.   On: 08/28/2018 01:19    Assessment/Plan:  Aggressive chronic renal insufficiency secondary to uncontrolled hypertension.  This represents acute on chronic renal failure.  Appears that he is noncompliant with his antihypertensive medications we do not have any other data points.  His he has no obstruction on his CT scan.  Urinalysis reveals large amounts of protein negative nitrites negative leukocytes hemoglobin was dipstick positive but no RBCs WBCs  Hypertension extremely high blood pressures from uncontrolled blood pressure with lower slowly to give patient time to auto regulate.  Will start labetalol 200 mg twice daily.  We will continue to follow  Low-grade fever to suggest flu swab  Increased troponins secondary to demand ischemia appreciate cardiology's assistance  Bones will check PTH  Anemia not an issue at this time  Thank you very much for most  interesting consult will be happy to follow along with you   LOS: 0 Sherril Croon @TODAY @1 :11 PM

## 2018-08-28 NOTE — Progress Notes (Signed)
ANTICOAGULATION CONSULT NOTE - Initial Consult  Pharmacy Consult for Heparin  Indication: chest pain/ACS  Allergies  Allergen Reactions  . Chlorthalidone     REACTION: felt "bad" and ED  . Hydrocodone Other (See Comments)    Nausea and dizzy  . Sulfonamide Derivatives     REACTION: unspecified    Patient Measurements: Height: 5\' 9"  (175.3 cm) Weight: 204 lb (92.5 kg) IBW/kg (Calculated) : 70.7  Vital Signs: Temp: 98.4 F (36.9 C) (09/27 2249) Temp Source: Oral (09/27 2109) BP: 244/127 (09/28 0100) Pulse Rate: 76 (09/28 0030)  Labs: Recent Labs    08/27/18 2132  HGB 12.1*  HCT 37.3*  PLT 283  CREATININE 11.33*    Estimated Creatinine Clearance: 7.5 mL/min (A) (by C-G formula based on SCr of 11.33 mg/dL (H)).   Medical History: Past Medical History:  Diagnosis Date  . Allergy   . BPH (benign prostatic hypertrophy)   . Hypertension   . Renal insufficiency   . Zoster 2007   facial, hospital 2007   Assessment: 62 y/o M presents to the ED with congestion and vomiting, pt is in acute on chronic renal failure, troponin is elevated at 1.97, starting heparin per pharmacy, Hgb 12.1, PTA meds reviewed.   Goal of Therapy:  Heparin level 0.3-0.7 units/ml Monitor platelets by anticoagulation protocol: Yes   Plan:  Heparin 4000 units BOLUS Start heparin drip at 1000 units/hr 1000 HL Daily CBC/HL Monitor for bleeding   Narda Bonds 08/28/2018,1:29 AM

## 2018-08-28 NOTE — H&P (Addendum)
History and Physical    Devin Harmon DDU:[id number] DOB: 07/10/1956 DOA: 08/27/2018  PCP: Venia Carbon, MD  Patient coming from: Home.  Chief Complaint: Chest congestion.  HPI: Devin Harmon is a 62 y.o. male with history of hypertension, chronic kidney disease stage III, BPH presents to the ER with complaints of chest congestion ongoing for last 2 to 3 days.  Denies any chest pain shortness of breath nausea vomiting diarrhea headache or any visual symptoms.  Patient has been having difficulty control hypertension recently and was placed on lisinopril which patient felt difficulty because of the discomfort after taking.  Not able to exactly explain what discomfort.  Patient otherwise denies any headache visual symptoms or focal deficits.  ED Course: In the ER patient blood pressure was found to be more than 237 systolic with EKG showing normal sinus rhythm and labs revealing markedly elevated creatinine at 11.3 with potassium of 6.4.  Troponin point-of-care was 1.97.  Patient's last known creatinine in our system was 2.16 and August 2018.  ER physician discussed with on-call nephrologist Dr. Carolin Sicks who advised patient to be started on bicarbonate drip.  UA shows some protein.  For the hyperkalemia patient was given sodium bicarbonate push calcium gluconate D50 and IV insulin.  Since patient had elevated troponin cardiology was consulted and at this time they feel patient may be having type II non-ST MI.  Patient is chest pain-free and was started on heparin without bolus.  Review of Systems: As per HPI, rest all negative.   Past Medical History:  Diagnosis Date  . Allergy   . BPH (benign prostatic hypertrophy)   . Hypertension   . Renal insufficiency   . Zoster 2007   facial, hospital 2007    History reviewed. No pertinent surgical history.   reports that he has never smoked. He has never used smokeless tobacco. He reports that he drinks alcohol. He reports that he has  current or past drug history.  Allergies  Allergen Reactions  . Chlorthalidone     REACTION: felt "bad" and ED  . Hydrocodone Other (See Comments)    Nausea and dizzy  . Sulfonamide Derivatives     REACTION: unspecified    Family History  Problem Relation Age of Onset  . Hypertension Mother   . Cancer Mother        lung cancer  . Hypertension Brother   . Heart disease Father   . Coronary artery disease Neg Hx   . Diabetes Neg Hx     Prior to Admission medications   Medication Sig Start Date End Date Taking? Authorizing Provider  acetaminophen (TYLENOL) 500 MG tablet Take 500-1,000 mg by mouth every 6 (six) hours as needed for mild pain.   Yes [provider]  lisinopril (PRINIVIL,ZESTRIL) 40 MG tablet Take 1 tablet (40 mg total) by mouth daily. 08/05/18  Yes Venia Carbon, MD  terazosin (HYTRIN) 5 MG capsule Take 1 capsule (5 mg total) by mouth at bedtime. 08/05/18  Yes Venia Carbon, MD    Physical Exam: Vitals:   08/28/18 0302 08/28/18 0315 08/28/18 0330 08/28/18 0352  BP: (!) 234/125 (!) 223/112 (!) 189/137 (!) 226/123  Pulse:  (!) 103 (!) 108 (!) 102  Resp:  (!) 29 (!) 27   Temp:    99.1 F (37.3 C)  TempSrc:    Oral  SpO2:  97% 99%   Weight:    87.4 kg  Height:    5\' 8"  (  1.727 m)      Constitutional: Moderately built and nourished. Vitals:   08/28/18 0302 08/28/18 0315 08/28/18 0330 08/28/18 0352  BP: (!) 234/125 (!) 223/112 (!) 189/137 (!) 226/123  Pulse:  (!) 103 (!) 108 (!) 102  Resp:  (!) 29 (!) 27   Temp:    99.1 F (37.3 C)  TempSrc:    Oral  SpO2:  97% 99%   Weight:    87.4 kg  Height:    5\' 8"  (1.727 m)   Eyes: Anicteric no pallor. ENMT: No discharge from the ears eyes nose or mouth. Neck: No mass felt.  No JVD appreciated. Respiratory: No rhonchi or crepitations. Cardiovascular: S1-S2 heard no murmurs appreciated. Abdomen: Soft nontender bowel sounds present. Musculoskeletal: No edema.  No joint effusion. Skin: No  rash. Neurologic: Alert awake oriented to time place and person.  Moves all extremities 5 x 5. Psychiatric: Appears normal per normal affect.   Labs on Admission: I have personally reviewed following labs and imaging studies  CBC: Recent Labs  Lab 08/27/18 2132  WBC 9.1  HGB 12.1*  HCT 37.3*  MCV 88.2  PLT 858   Basic Metabolic Panel: Recent Labs  Lab 08/27/18 2132  NA 137  K 6.4*  CL 105  CO2 17*  GLUCOSE 90  BUN 85*  CREATININE 11.33*  CALCIUM 8.4*   GFR: Estimated Creatinine Clearance: 7.2 mL/min (A) (by C-G formula based on SCr of 11.33 mg/dL (H)). Liver Function Tests: No results for input(s): AST, ALT, ALKPHOS, BILITOT, PROT, ALBUMIN in the last 168 hours. No results for input(s): LIPASE, AMYLASE in the last 168 hours. No results for input(s): AMMONIA in the last 168 hours. Coagulation Profile: No results for input(s): INR, PROTIME in the last 168 hours. Cardiac Enzymes: No results for input(s): CKTOTAL, CKMB, CKMBINDEX, TROPONINI in the last 168 hours. BNP (last 3 results) No results for input(s): PROBNP in the last 8760 hours. HbA1C: No results for input(s): HGBA1C in the last 72 hours. CBG: No results for input(s): GLUCAP in the last 168 hours. Lipid Profile: No results for input(s): CHOL, HDL, LDLCALC, TRIG, CHOLHDL, LDLDIRECT in the last 72 hours. Thyroid Function Tests: No results for input(s): TSH, T4TOTAL, FREET4, T3FREE, THYROIDAB in the last 72 hours. Anemia Panel: No results for input(s): VITAMINB12, FOLATE, FERRITIN, TIBC, IRON, RETICCTPCT in the last 72 hours. Urine analysis:    Component Value Date/Time   COLORURINE STRAW (A) 08/27/2018 2129   APPEARANCEUR CLEAR 08/27/2018 2129   LABSPEC 1.011 08/27/2018 2129   PHURINE 6.0 08/27/2018 2129   GLUCOSEU 50 (A) 08/27/2018 2129   HGBUR SMALL (A) 08/27/2018 2129   BILIRUBINUR NEGATIVE 08/27/2018 2129   KETONESUR 5 (A) 08/27/2018 2129   PROTEINUR >=300 (A) 08/27/2018 2129   NITRITE NEGATIVE  08/27/2018 2129   LEUKOCYTESUR NEGATIVE 08/27/2018 2129   Sepsis Labs: @LABRCNTIP (procalcitonin:4,lacticidven:4) )No results found for this or any previous visit (from the past 240 hour(s)).   Radiological Exams on Admission: Dg Chest 2 View  Result Date: 08/27/2018 CLINICAL DATA:  62 year old male with cough EXAM: CHEST - 2 VIEW COMPARISON:  None. FINDINGS: The lungs are clear. There is no pleural effusion or pneumothorax. Borderline cardiomegaly. No acute osseous pathology. IMPRESSION: No active cardiopulmonary disease. Electronically Signed   By: Anner Crete M.D.   On: 08/27/2018 23:44   Ct Renal Stone Study  Result Date: 08/28/2018 CLINICAL DATA:  62 year old male with abdominal pain. EXAM: CT ABDOMEN AND PELVIS WITHOUT CONTRAST TECHNIQUE: Multidetector CT imaging  of the abdomen and pelvis was performed following the standard protocol without IV contrast. COMPARISON:  None. FINDINGS: Evaluation of this exam is limited in the absence of intravenous contrast. Evaluation is also limited due to respiratory motion artifact. Lower chest: The visualized lung bases are clear. There is cardiomegaly and coronary vascular calcification. No intra-abdominal free air or free fluid. Hepatobiliary: No focal liver abnormality is seen. No gallstones, gallbladder wall thickening, or biliary dilatation. Pancreas: Unremarkable. No pancreatic ductal dilatation or surrounding inflammatory changes. Spleen: Normal in size without focal abnormality. Adrenals/Urinary Tract: Mild bilateral adrenal thickening. There is a subcentimeter exophytic lesion from the anterolateral aspect of the right kidney which is too small to characterize. There is no hydronephrosis or nephrolithiasis on either side. The visualized ureters and urinary bladder appear unremarkable. Stomach/Bowel: There is no bowel obstruction or active inflammation. Normal appendix. Vascular/Lymphatic: Mild aortoiliac atherosclerotic disease. No portal venous  gas. There is no adenopathy. Reproductive: Prostate and seminal vesicles are grossly unremarkable. No pelvic mass. Other: Small fat containing umbilical hernia. Musculoskeletal: Degenerative changes of the lower lumbar spine. No acute osseous pathology. IMPRESSION: No acute intra-abdominopelvic pathology. No hydronephrosis or nephrolithiasis. Electronically Signed   By: Anner Crete M.D.   On: 08/28/2018 01:19    EKG: Independently reviewed.  Normal sinus rhythm.  Assessment/Plan Principal Problem:   Acute renal failure (HCC) Active Problems:   Non-ST elevated myocardial infarction [facility])   Hypertensive urgency    1. Acute on chronic renal failure stage III with market worsening of creatinine at this time with hyperkalemia -was given sodium bicarbonate push and was started on sodium bicarbonate drip in addition for hyperkalemia patient was given calcium gluconate D50 IV insulin.  CT renal study does not show any obstruction.  Post vital residual was around 200 cc for which Foley catheter was placed.  Check FENa and further recommendations.  Nephrology.  Will hold off any lisinopril which patient was recently taking. 2. Hypertensive urgency -patient was placed on Nitropatch and I have added hydralazine 25 mg p.o. 3 times daily with PRN IV hydralazine for systolic blood pressure more than 180.  Patient is also on terazosin.  Gently lower blood pressure closely follow blood pressure trends. 3. Non-ST elevation MI -appreciate cardiology consult patient started on heparin.  Check 2D echo cycle cardiac markers.  Cardiology at this time feels patient's non-ST elevation MI likely type II. 4. History of BPH on terazosin. 5. Anemia likely from renal disease follow CBC.   DVT prophylaxis: Heparin infusion. Code Status: Full code. Family Communication: His wife. Disposition Plan: Home. Consults called: Cardiology and nephrology. Admission status: Inpatient.   Rise Patience MD [facility] Pager [phone].  If 7PM-7AM, please contact night-coverage [email or web address] Password [email or web address]  08/28/2018, 4:09 AM

## 2018-08-28 NOTE — Progress Notes (Signed)
Doing well from a CV standpoint No ischemic symptoms  I agree with Dr Lefors Lions that elevated troponin is likely due to demand ischemia and not ACS.  Ok to stop heparin drip from my standpoint. Not currently a candidate for any CV procedures in the setting of his renal failure.  Cardiology to be available as needed over the weekend.  Thompson Grayer MD, [facility] 08/28/2018 12:32 PM

## 2018-08-28 NOTE — Progress Notes (Signed)
[facility]                                                                              Patient Demographics  Devin Harmon, is a 62 y.o. male, DOB - [birth year], [id number]  Admit date - 08/27/2018   Admitting Physician Rise Patience, MD  Outpatient Primary MD for the patient is Venia Carbon, MD  Outpatient specialists:   LOS - 0  days   Medical records reviewed and are as summarized below:    Chief Complaint  Patient presents with  . Cough  . Nasal Congestion  . Weakness  . Hypertension       Brief summary   Patient is a 62 year old male with hypertension, noncompliant, CKD stage III, BPH presented to ED with chest congestion ongoing for last 2 to 3 days.  No nausea vomiting diarrhea, chest pain, shortness of breath.  Per patient's family, he has not been taking his antihypertensives, he was placed on lisinopril but he stopped it. In ED, patient's BP was > 024 systolic.  Elevated creatinine 11.3, potassium 6.4, troponin I 0.97.  Nephrology and cardiology were consulted.  Assessment & Plan    Principal Problem:   Acute renal failure (HCC) on CKD stage III with marked worsening of creatinine, hyperkalemia, hypertensive urgency -CT renal study does not show any obstruction.  -Foley catheter placed for urinary retention -Nephrology consulted, hold off on lisinopril. -UA with marked proteinuria, obtain SPEP, UPEP, further studies -Given marked hypertension and acute kidney injury, rule out renal artery stenosis, follow renal artery duplex  Active Problems:   Non-ST elevated myocardial infarction [facility]) -Currently no chest pain or shortness of breath.  Cardiology consulted, currently patient on IV heparin drip -Follow 2D echocardiogram    Hypertensive urgency -Patient has been noncompliant with his antihypertensives per family. -Continue hydralazine -Rule out renal artery stenosis, feel, obtain SPEP, UPEP, renal artery duplex, plasma  metanephrines, 24-hour urine catecholamines and metanephrines   BPH -Postvoid residual over 300, Foley catheter placed -Continue terazosin  Code Status: Full code DVT Prophylaxis: Heparin drip Family Communication: Discussed in detail with the patient, all imaging results, lab results explained to the patient, son and daughter at the bedside   Disposition Plan: Patient needs to be inpatient, creatinine 11.6, positive troponins, further work-up needed  Time Spent in minutes 35 minutes  Procedures:  CT abdomen  Consultants:   Nephrology Cardiology  Antimicrobials:      Medications  Scheduled Meds: . hydrALAZINE  25 mg Oral Q8H  . terazosin  5 mg Oral QHS   Continuous Infusions: . heparin 1,000 Units/hr (08/28/18 0255)  .  sodium bicarbonate  infusion 1000 mL 50 mL/hr at 08/28/18 0240   PRN Meds:.acetaminophen **OR** acetaminophen, hydrALAZINE, ondansetron **OR** ondansetron (ZOFRAN) IV   Antibiotics   Anti-infectives (From admission, onward)   None        Subjective:   Bryer Cozzolino was seen and examined today.  BP still elevated, does not feel too good.  Family at the bedside.  No chest pain. Patient denies dizziness, chest pain, abdominal pain, N/V/D/C, new weakness, numbess, tingling.   Objective:  Vitals:   08/28/18 0330 08/28/18 0352 08/28/18 0700 08/28/18 0816  BP: (!) 189/137 (!) 226/123 (!) 183/111 (!) 185/121  Pulse: (!) 108 (!) 102 (!) 101 (!) 106  Resp: (!) 27     Temp:  99.1 F (37.3 C)  99.2 F (37.3 C)  TempSrc:  Oral  Oral  SpO2: 99%  96% 97%  Weight:  87.4 kg    Height:  5\' 8"  (1.727 m)      Intake/Output Summary (Last 24 hours) at 08/28/2018 1049 Last data filed at 08/28/2018 0336 Gross per 24 hour  Intake -  Output 200 ml  Net -200 ml     Wt Readings from Last 3 Encounters:  08/28/18 87.4 kg  08/16/18 92.5 kg  08/05/18 92.1 kg     Exam  General: Alert and oriented x 3, NAD  Eyes:   HEENT:  Atraumatic,  normocephalic  Cardiovascular: S1 S2 auscultated, no rubs, murmurs or gallops. Regular rate and rhythm.  Respiratory: Clear to auscultation bilaterally, no wheezing, rales or rhonchi  Gastrointestinal: Soft, nontender, nondistended, + bowel sounds  Ext: no pedal edema bilaterally  Neuro: no FND's  Musculoskeletal: No digital cyanosis, clubbing  Skin: No rashes  Psych: Normal affect and demeanor, alert and oriented x3    Data Reviewed:  I have personally reviewed following labs and imaging studies  Micro Results No results found for this or any previous visit (from the past 240 hour(s)).  Radiology Reports Dg Chest 2 View  Result Date: 08/27/2018 CLINICAL DATA:  62 year old male with cough EXAM: CHEST - 2 VIEW COMPARISON:  None. FINDINGS: The lungs are clear. There is no pleural effusion or pneumothorax. Borderline cardiomegaly. No acute osseous pathology. IMPRESSION: No active cardiopulmonary disease. Electronically Signed   By: Anner Crete M.D.   On: 08/27/2018 23:44   Ct Renal Stone Study  Result Date: 08/28/2018 CLINICAL DATA:  62 year old male with abdominal pain. EXAM: CT ABDOMEN AND PELVIS WITHOUT CONTRAST TECHNIQUE: Multidetector CT imaging of the abdomen and pelvis was performed following the standard protocol without IV contrast. COMPARISON:  None. FINDINGS: Evaluation of this exam is limited in the absence of intravenous contrast. Evaluation is also limited due to respiratory motion artifact. Lower chest: The visualized lung bases are clear. There is cardiomegaly and coronary vascular calcification. No intra-abdominal free air or free fluid. Hepatobiliary: No focal liver abnormality is seen. No gallstones, gallbladder wall thickening, or biliary dilatation. Pancreas: Unremarkable. No pancreatic ductal dilatation or surrounding inflammatory changes. Spleen: Normal in size without focal abnormality. Adrenals/Urinary Tract: Mild bilateral adrenal thickening. There is a  subcentimeter exophytic lesion from the anterolateral aspect of the right kidney which is too small to characterize. There is no hydronephrosis or nephrolithiasis on either side. The visualized ureters and urinary bladder appear unremarkable. Stomach/Bowel: There is no bowel obstruction or active inflammation. Normal appendix. Vascular/Lymphatic: Mild aortoiliac atherosclerotic disease. No portal venous gas. There is no adenopathy. Reproductive: Prostate and seminal vesicles are grossly unremarkable. No pelvic mass. Other: Small fat containing umbilical hernia. Musculoskeletal: Degenerative changes of the lower lumbar spine. No acute osseous pathology. IMPRESSION: No acute intra-abdominopelvic pathology. No hydronephrosis or nephrolithiasis. Electronically Signed   By: Anner Crete M.D.   On: 08/28/2018 01:19    Lab Data:  CBC: Recent Labs  Lab 08/27/18 2132 08/28/18 0448  WBC 9.1 8.4  NEUTROABS  --  6.4  HGB 12.1* 11.2*  HCT 37.3* 34.7*  MCV 88.2 87.0  PLT 283 794   Basic Metabolic Panel:  Recent Labs  Lab 08/27/18 2132 08/28/18 0448  NA 137 139  K 6.4* 4.5  CL 105 105  CO2 17* 18*  GLUCOSE 90 89  BUN 85* 88*  CREATININE 11.33* 11.60*  CALCIUM 8.4* 8.7*  MG  --  2.0  PHOS  --  6.9*   GFR: Estimated Creatinine Clearance: 7 mL/min (A) (by C-G formula based on SCr of 11.6 mg/dL (H)). Liver Function Tests: Recent Labs  Lab 08/28/18 0448  AST 18  ALT 13  ALKPHOS 59  BILITOT 0.7  PROT 6.8  ALBUMIN 3.9   No results for input(s): LIPASE, AMYLASE in the last 168 hours. No results for input(s): AMMONIA in the last 168 hours. Coagulation Profile: No results for input(s): INR, PROTIME in the last 168 hours. Cardiac Enzymes: Recent Labs  Lab 08/28/18 0448 08/28/18 0919  TROPONINI 1.52* 1.42*   BNP (last 3 results) No results for input(s): PROBNP in the last 8760 hours. HbA1C: No results for input(s): HGBA1C in the last 72 hours. CBG: Recent Labs  Lab  08/28/18 0828  GLUCAP 94   Lipid Profile: No results for input(s): CHOL, HDL, LDLCALC, TRIG, CHOLHDL, LDLDIRECT in the last 72 hours. Thyroid Function Tests: Recent Labs    08/28/18 0448  TSH 1.687   Anemia Panel: No results for input(s): VITAMINB12, FOLATE, FERRITIN, TIBC, IRON, RETICCTPCT in the last 72 hours. Urine analysis:    Component Value Date/Time   COLORURINE STRAW (A) 08/27/2018 2129   APPEARANCEUR CLEAR 08/27/2018 2129   LABSPEC 1.011 08/27/2018 2129   PHURINE 6.0 08/27/2018 2129   GLUCOSEU 50 (A) 08/27/2018 2129   HGBUR SMALL (A) 08/27/2018 2129   BILIRUBINUR NEGATIVE 08/27/2018 2129   KETONESUR 5 (A) 08/27/2018 2129   PROTEINUR >=300 (A) 08/27/2018 2129   NITRITE NEGATIVE 08/27/2018 2129   LEUKOCYTESUR NEGATIVE 08/27/2018 2129     Ripudeep Rai M.D. [facility] 08/28/2018, 10:49 AM  Pager: [phone] Between 7am to 7pm - call Pager - [phone]  After 7pm go to [email or web address] - password [email or web address]  Call night coverage person covering after 7pm

## 2018-08-28 NOTE — ED Notes (Signed)
Iv sticks x 2 unsuccessful

## 2018-08-29 ENCOUNTER — Inpatient Hospital Stay (HOSPITAL_COMMUNITY): Payer: 59

## 2018-08-29 DIAGNOSIS — I5030 Unspecified diastolic (congestive) heart failure: Secondary | ICD-10-CM

## 2018-08-29 DIAGNOSIS — I160 Hypertensive urgency: Secondary | ICD-10-CM

## 2018-08-29 LAB — CBC
HCT: 33 % — ABNORMAL LOW (ref 39.0–52.0)
Hemoglobin: 10.8 g/dL — ABNORMAL LOW (ref 13.0–17.0)
MCH: 28.3 pg (ref 26.0–34.0)
MCHC: 32.7 g/dL (ref 30.0–36.0)
MCV: 86.4 fL (ref 78.0–100.0)
PLATELETS: 249 10*3/uL (ref 150–400)
RBC: 3.82 MIL/uL — ABNORMAL LOW (ref 4.22–5.81)
RDW: 13.5 % (ref 11.5–15.5)
WBC: 8.3 10*3/uL (ref 4.0–10.5)

## 2018-08-29 LAB — BASIC METABOLIC PANEL
ANION GAP: 11 (ref 5–15)
BUN: 87 mg/dL — AB (ref 8–23)
CHLORIDE: 104 mmol/L (ref 98–111)
CO2: 24 mmol/L (ref 22–32)
CREATININE: 11.69 mg/dL — AB (ref 0.61–1.24)
Calcium: 8.2 mg/dL — ABNORMAL LOW (ref 8.9–10.3)
GFR calc Af Amer: 5 mL/min — ABNORMAL LOW (ref >60)
GFR calc non Af Amer: 4 mL/min — ABNORMAL LOW (ref >60)
Glucose, Bld: 128 mg/dL — ABNORMAL HIGH (ref 70–99)
POTASSIUM: 5.7 mmol/L — AB (ref 3.5–5.1)
Sodium: 139 mmol/L (ref 135–145)

## 2018-08-29 LAB — GLUCOSE, CAPILLARY
Glucose-Capillary: 116 mg/dL — ABNORMAL HIGH (ref 70–99)
Glucose-Capillary: 118 mg/dL — ABNORMAL HIGH (ref 70–99)
Glucose-Capillary: 124 mg/dL — ABNORMAL HIGH (ref 70–99)
Glucose-Capillary: 124 mg/dL — ABNORMAL HIGH (ref 70–99)
Glucose-Capillary: 136 mg/dL — ABNORMAL HIGH (ref 70–99)
Glucose-Capillary: 144 mg/dL — ABNORMAL HIGH (ref 70–99)

## 2018-08-29 LAB — ECHOCARDIOGRAM COMPLETE
Height: 68 in
WEIGHTICAEL: 2998.4 [oz_av]

## 2018-08-29 LAB — PARATHYROID HORMONE, INTACT (NO CA): PTH: 191 pg/mL — ABNORMAL HIGH (ref 15–65)

## 2018-08-29 MED ORDER — IPRATROPIUM-ALBUTEROL 0.5-2.5 (3) MG/3ML IN SOLN: 3.0000 mL | Freq: Four times a day (QID) | RESPIRATORY_TRACT | Status: DC | PRN

## 2018-08-29 MED ORDER — SODIUM POLYSTYRENE SULFONATE 15 GM/60ML PO SUSP
30.0000 g | Freq: Once | ORAL | Status: DC
  Filled 2018-08-29 (×2): qty 120

## 2018-08-29 MED ORDER — FLUTICASONE PROPIONATE 50 MCG/ACT NA SUSP
1.0000 | Freq: Every day | NASAL | Status: DC
  Filled 2018-08-29: qty 16

## 2018-08-29 MED ORDER — IPRATROPIUM-ALBUTEROL 0.5-2.5 (3) MG/3ML IN SOLN
3.0000 mL | Freq: Four times a day (QID) | RESPIRATORY_TRACT | Status: DC
  Filled 2018-08-29: qty 3

## 2018-08-29 MED ORDER — AMLODIPINE BESYLATE 10 MG PO TABS
10.0000 mg | ORAL_TABLET | Freq: Every day | ORAL | Status: DC
  Administered 2018-08-29 – 2018-09-01 (×4): 10 mg via ORAL
  Filled 2018-08-29 (×4): qty 1

## 2018-08-29 NOTE — Progress Notes (Signed)
[facility]                                                                              Patient Demographics  Devin Harmon, is a 62 y.o. male, DOB - 06/19/1956, [id number]  Admit date - 08/27/2018   Admitting Physician Rise Patience, MD  Outpatient Primary MD for the patient is Venia Carbon, MD  Outpatient specialists:   LOS - 1  days   Medical records reviewed and are as summarized below:    Chief Complaint  Patient presents with  . Cough  . Nasal Congestion  . Weakness  . Hypertension       Brief summary   Patient is a 62 year old male with hypertension, noncompliant, CKD stage III, BPH presented to ED with chest congestion ongoing for last 2 to 3 days.  No nausea vomiting diarrhea, chest pain, shortness of breath.  Per patient's family, he has not been taking his antihypertensives, he was placed on lisinopril but he stopped it. In ED, patient's BP was > 626 systolic.  Elevated creatinine 11.3, potassium 6.4, troponin I 0.97.  Nephrology and cardiology were consulted.  Assessment & Plan    Principal Problem:   Acute renal failure (HCC) on CKD stage III with marked worsening of creatinine, hyperkalemia, hypertensive urgency -CT renal study does not show any obstruction.  -Foley catheter placed for urinary retention -Nephrology consulted, hold off on lisinopril. -Renal artery Doppler showed no significant renal artery stenosis, bilateral kidneys exhibit increased echogenicity suggestive of CKD -Discussed with Dr. Justin Mend, nephrology had recommended hemodialysis to the patient.  I discussed in detail with the patient and his wife, sister, daughter at the bedside.  Wife reports that patient's creatinine was 2.1 last year with PCP, he was recommended to see nephrologist as it was trending up.  Patient started to feel better hence they did not think that he would need dialysis.  Wife feels that once patient is on antihypertensive regimen, his  creatinine function will improve. -I am not sure if they understand the complexity of the situation, need for dialysis.  Palliative consult has been placed for goals of care.   Active Problems:   Non-ST elevated myocardial infarction [facility]) -Currently no chest pain or shortness of breath.   -2D echo showed EF of 55 to 94%, grade 1 diastolic dysfunction, left atrium severely dilated -Per cardiology, elevated troponins likely due to demand ischemia and not ACS.  Not currently a candidate for any cardiovascular procedures in the setting of renal failure.    Hypertensive urgency -Patient has been noncompliant with his antihypertensives per family. -Continue hydralazine and labetalol -Ruled out renal artery stenosis  BPH -Postvoid residual over 300, Foley catheter placed -Continue terazosin  Code Status: Full code DVT Prophylaxis: Heparin drip Family Communication: Discussed in detail with the patient, all imaging results, lab results explained to the patient, son and daughter at the bedside   Disposition Plan:  Unclear disposition for now, palliative consult called  Time Spent in minutes 25 minutes  Procedures:  CT abdomen 2D echo Renal duplex  Consultants:   Nephrology Cardiology  Antimicrobials:  Medications  Scheduled Meds: . amLODipine  10 mg Oral Daily  . folic acid  1 mg Oral Daily  . heparin injection (subcutaneous)  5,000 Units Subcutaneous Q8H  . hydrALAZINE  25 mg Oral Q8H  . ipratropium-albuterol  3 mL Nebulization Q6H  . labetalol  200 mg Oral BID  . multivitamin with minerals  1 tablet Oral Daily  . sodium polystyrene  30 g Oral Once  . terazosin  5 mg Oral QHS  . thiamine  100 mg Oral Daily   Or  . thiamine  100 mg Intravenous Daily   Continuous Infusions: .  sodium bicarbonate  infusion 1000 mL 50 mL/hr at 08/28/18 2127   PRN Meds:.acetaminophen **OR** acetaminophen, hydrALAZINE, LORazepam **OR** LORazepam, ondansetron **OR** ondansetron  (ZOFRAN) IV   Antibiotics   Anti-infectives (From admission, onward)   None        Subjective:   Devin Harmon was seen and examined today.  Alert and awake, oriented but not clear if he understands.  Wife mostly speaks for him.  Denies any chest pain or shortness of breath.  . Patient denies dizziness, no nausea or vomiting.  Objective:   Vitals:   08/29/18 0800 08/29/18 1110 08/29/18 1129 08/29/18 1154  BP:  (!) 193/111 (!) 189/105 (!) 185/101  Pulse: 71 77 77 79  Resp:  (!) 22 (!) 22   Temp:   98.5 F (36.9 C)   TempSrc:   Oral   SpO2: 95% 96% 91%   Weight:      Height:        Intake/Output Summary (Last 24 hours) at 08/29/2018 1307 Last data filed at 08/29/2018 1131 Gross per 24 hour  Intake 590 ml  Output 1100 ml  Net -510 ml     Wt Readings from Last 3 Encounters:  08/29/18 85 kg  08/16/18 92.5 kg  08/05/18 92.1 kg     Exam   General: Alert and oriented x 3,   Eyes:   HEENT:  Atraumatic, normocephalic  Cardiovascular: S1 S2 auscultated Regular rate and rhythm. No pedal edema b/l  Respiratory: Coarse breath sounds bilaterally  Gastrointestinal: Soft, nontender, nondistended, + bowel sounds  Ext: no pedal edema bilaterally  Neuro: no new deficits  Musculoskeletal: No digital cyanosis, clubbing  Skin: No rashes  Psych: alert and awake    Data Reviewed:  I have personally reviewed following labs and imaging studies  Micro Results Recent Results (from the past 240 hour(s))  Culture, blood (routine x 2)     Status: None (Preliminary result)   Collection Time: 08/28/18  2:30 PM  Result Value Ref Range Status   Specimen Description BLOOD RIGHT HAND  Final   Special Requests   Final    BOTTLES DRAWN AEROBIC ONLY Blood Culture adequate volume   Culture   Final    NO GROWTH < 24 HOURS Performed at [facility], [address] [place], [place] [address]    Report Status PENDING  Incomplete  Culture, blood (routine x 2)      Status: None (Preliminary result)   Collection Time: 08/28/18  2:45 PM  Result Value Ref Range Status   Specimen Description BLOOD LEFT ANTECUBITAL  Final   Special Requests   Final    BOTTLES DRAWN AEROBIC ONLY Blood Culture adequate volume   Culture   Final    NO GROWTH < 24 HOURS Performed at [facility], [place] [address], [place], [place] [address]    Report  Status PENDING  Incomplete    Radiology Reports Dg Chest 2 View  Result Date: 08/27/2018 CLINICAL DATA:  62 year old male with cough EXAM: CHEST - 2 VIEW COMPARISON:  None. FINDINGS: The lungs are clear. There is no pleural effusion or pneumothorax. Borderline cardiomegaly. No acute osseous pathology. IMPRESSION: No active cardiopulmonary disease. Electronically Signed   By: Anner Crete M.D.   On: 08/27/2018 23:44   Ct Renal Stone Study  Result Date: 08/28/2018 CLINICAL DATA:  62 year old male with abdominal pain. EXAM: CT ABDOMEN AND PELVIS WITHOUT CONTRAST TECHNIQUE: Multidetector CT imaging of the abdomen and pelvis was performed following the standard protocol without IV contrast. COMPARISON:  None. FINDINGS: Evaluation of this exam is limited in the absence of intravenous contrast. Evaluation is also limited due to respiratory motion artifact. Lower chest: The visualized lung bases are clear. There is cardiomegaly and coronary vascular calcification. No intra-abdominal free air or free fluid. Hepatobiliary: No focal liver abnormality is seen. No gallstones, gallbladder wall thickening, or biliary dilatation. Pancreas: Unremarkable. No pancreatic ductal dilatation or surrounding inflammatory changes. Spleen: Normal in size without focal abnormality. Adrenals/Urinary Tract: Mild bilateral adrenal thickening. There is a subcentimeter exophytic lesion from the anterolateral aspect of the right kidney which is too small to characterize. There is no hydronephrosis or nephrolithiasis on either side. The visualized ureters and  urinary bladder appear unremarkable. Stomach/Bowel: There is no bowel obstruction or active inflammation. Normal appendix. Vascular/Lymphatic: Mild aortoiliac atherosclerotic disease. No portal venous gas. There is no adenopathy. Reproductive: Prostate and seminal vesicles are grossly unremarkable. No pelvic mass. Other: Small fat containing umbilical hernia. Musculoskeletal: Degenerative changes of the lower lumbar spine. No acute osseous pathology. IMPRESSION: No acute intra-abdominopelvic pathology. No hydronephrosis or nephrolithiasis. Electronically Signed   By: Anner Crete M.D.   On: 08/28/2018 01:19    Lab Data:  CBC: Recent Labs  Lab 08/27/18 2132 08/28/18 0448 08/29/18 0302  WBC 9.1 8.4 8.3  NEUTROABS  --  6.4  --   HGB 12.1* 11.2* 10.8*  HCT 37.3* 34.7* 33.0*  MCV 88.2 87.0 86.4  PLT 283 255 681   Basic Metabolic Panel: Recent Labs  Lab 08/27/18 2132 08/28/18 0448 08/29/18 0302  NA 137 139 139  K 6.4* 4.5 5.7*  CL 105 105 104  CO2 17* 18* 24  GLUCOSE 90 89 128*  BUN 85* 88* 87*  CREATININE 11.33* 11.60* 11.69*  CALCIUM 8.4* 8.7* 8.2*  MG  --  2.0  --   PHOS  --  6.9*  --    GFR: Estimated Creatinine Clearance: 6.9 mL/min (A) (by C-G formula based on SCr of 11.69 mg/dL (H)). Liver Function Tests: Recent Labs  Lab 08/28/18 0448  AST 18  ALT 13  ALKPHOS 59  BILITOT 0.7  PROT 6.8  ALBUMIN 3.9   No results for input(s): LIPASE, AMYLASE in the last 168 hours. No results for input(s): AMMONIA in the last 168 hours. Coagulation Profile: No results for input(s): INR, PROTIME in the last 168 hours. Cardiac Enzymes: Recent Labs  Lab 08/28/18 0448 08/28/18 0919 08/28/18 1441  TROPONINI 1.52* 1.42* 1.22*   BNP (last 3 results) No results for input(s): PROBNP in the last 8760 hours. HbA1C: No results for input(s): HGBA1C in the last 72 hours. CBG: Recent Labs  Lab 08/28/18 2057 08/29/18 0005 08/29/18 0507 08/29/18 0809 08/29/18 1127  GLUCAP  115* 144* 136* 124* 124*   Lipid Profile: No results for input(s): CHOL, HDL, LDLCALC, TRIG, CHOLHDL, LDLDIRECT in the  last 72 hours. Thyroid Function Tests: Recent Labs    08/28/18 0448  TSH 1.687   Anemia Panel: No results for input(s): VITAMINB12, FOLATE, FERRITIN, TIBC, IRON, RETICCTPCT in the last 72 hours. Urine analysis:    Component Value Date/Time   COLORURINE STRAW (A) 08/27/2018 2129   APPEARANCEUR CLEAR 08/27/2018 2129   LABSPEC 1.011 08/27/2018 2129   PHURINE 6.0 08/27/2018 2129   GLUCOSEU 50 (A) 08/27/2018 2129   HGBUR SMALL (A) 08/27/2018 2129   BILIRUBINUR NEGATIVE 08/27/2018 2129   KETONESUR 5 (A) 08/27/2018 2129   PROTEINUR >=300 (A) 08/27/2018 2129   NITRITE NEGATIVE 08/27/2018 2129   LEUKOCYTESUR NEGATIVE 08/27/2018 2129     Ripudeep Rai M.D. [facility] 08/29/2018, 1:07 PM  Pager: [phone] Between 7am to 7pm - call Pager - [phone]  After 7pm go to [email or web address] - password [email or web address]  Call night coverage person covering after 7pm

## 2018-08-29 NOTE — Progress Notes (Signed)
  Echocardiogram 2D Echocardiogram has been performed.  Devin Harmon 08/29/2018, 9:01 AM

## 2018-08-29 NOTE — Progress Notes (Signed)
[name] ROUNDING NOTE   Subjective:   Comfortable night no complaints wife is in room with patient.  He appears somewhat confused but is answering questions appropriately  Pressure 189/95 pulse 77 temperature 98.5 O2 sats 91% room air  Labs sodium 139 potassium 5.7 chloride 104 CO2 24 BUN 87 creatinine 11.69 calcium 8.2 WBC 8.3 hemoglobin 10.8 platelets 249 influenza studies negative  Renal ultrasound pending and 2D echo pending this morning.  Preliminary results limited due to technical difficulties of body habitus no obvious evidence of hemodynamically significant renal artery stenosis seen bilateral kidneys exhibit increased echogenicity.  Toxicology screen unremarkable.  Catecholamines pending.    Objective:  Vital signs in last 24 hours:  Temp:  [98.5 F (36.9 C)-99.2 F (37.3 C)] 98.5 F (36.9 C) (09/29 1129) Pulse Rate:  [62-92] 77 (09/29 1129) Resp:  [22] 22 (09/29 1129) BP: (143-214)/(93-125) 189/105 (09/29 1129) SpO2:  [91 %-97 %] 91 % (09/29 1129) Weight:  [85 kg] 85 kg (09/29 0500)  Weight change: -7.53 kg Filed Weights   08/27/18 2109 08/28/18 0352 08/29/18 0500  Weight: 92.5 kg 87.4 kg 85 kg    Intake/Output: I/O last 3 completed shifts: In: 36 [P.O.:480] Out: 1450 [Urine:1150; Stool:300]   Intake/Output this shift:  Total I/O In: 110 [P.O.:110] Out: 11 [Urine:550] Ill-appearing man nondistressed CVS- RRR i no rubs or gallops RS- CTA decreased breath sounds at bases ABD- BS present soft non-distended, splenomegaly EXT- no edema   Basic Metabolic Panel: Recent Labs  Lab 08/27/18 2132 08/28/18 0448 08/29/18 0302  NA 137 139 139  K 6.4* 4.5 5.7*  CL 105 105 104  CO2 17* 18* 24  GLUCOSE 90 89 128*  BUN 85* 88* 87*  CREATININE 11.33* 11.60* 11.69*  CALCIUM 8.4* 8.7* 8.2*  MG  --  2.0  --   PHOS  --  6.9*  --     Liver Function Tests: Recent Labs  Lab 08/28/18 0448  AST 18  ALT 13  ALKPHOS 59  BILITOT 0.7  PROT  6.8  ALBUMIN 3.9   No results for input(s): LIPASE, AMYLASE in the last 168 hours. No results for input(s): AMMONIA in the last 168 hours.  CBC: Recent Labs  Lab 08/27/18 2132 08/28/18 0448 08/29/18 0302  WBC 9.1 8.4 8.3  NEUTROABS  --  6.4  --   HGB 12.1* 11.2* 10.8*  HCT 37.3* 34.7* 33.0*  MCV 88.2 87.0 86.4  PLT 283 255 249    Cardiac Enzymes: Recent Labs  Lab 08/28/18 0448 08/28/18 0919 08/28/18 1441  TROPONINI 1.52* 1.42* 1.22*    BNP: Invalid input(s): POCBNP  CBG: Recent Labs  Lab 08/28/18 2057 08/29/18 0005 08/29/18 0507 08/29/18 0809 08/29/18 1127  GLUCAP 115* 144* 136* 124* 37*    Microbiology: Results for orders placed or performed in visit on 08/13/17  Fecal occult blood, imunochemical     Status: None   Collection Time: 08/13/17  1:56 PM  Result Value Ref Range Status   Fecal Occult Bld Negative Negative Final    Coagulation Studies: No results for input(s): LABPROT, INR in the last 72 hours.  Urinalysis: Recent Labs    08/27/18 2129  COLORURINE STRAW*  LABSPEC 1.011  PHURINE 6.0  GLUCOSEU 50*  HGBUR SMALL*  BILIRUBINUR NEGATIVE  KETONESUR 5*  PROTEINUR >=300*  NITRITE NEGATIVE  LEUKOCYTESUR NEGATIVE      Imaging: Dg Chest 2 View  Result Date: 08/27/2018 CLINICAL DATA:  62 year old male with cough EXAM: CHEST -  2 VIEW COMPARISON:  None. FINDINGS: The lungs are clear. There is no pleural effusion or pneumothorax. Borderline cardiomegaly. No acute osseous pathology. IMPRESSION: No active cardiopulmonary disease. Electronically Signed   By: Anner Crete M.D.   On: 08/27/2018 23:44   Ct Renal Stone Study  Result Date: 08/28/2018 CLINICAL DATA:  62 year old male with abdominal pain. EXAM: CT ABDOMEN AND PELVIS WITHOUT CONTRAST TECHNIQUE: Multidetector CT imaging of the abdomen and pelvis was performed following the standard protocol without IV contrast. COMPARISON:  None. FINDINGS: Evaluation of this exam is limited in the  absence of intravenous contrast. Evaluation is also limited due to respiratory motion artifact. Lower chest: The visualized lung bases are clear. There is cardiomegaly and coronary vascular calcification. No intra-abdominal free air or free fluid. Hepatobiliary: No focal liver abnormality is seen. No gallstones, gallbladder wall thickening, or biliary dilatation. Pancreas: Unremarkable. No pancreatic ductal dilatation or surrounding inflammatory changes. Spleen: Normal in size without focal abnormality. Adrenals/Urinary Tract: Mild bilateral adrenal thickening. There is a subcentimeter exophytic lesion from the anterolateral aspect of the right kidney which is too small to characterize. There is no hydronephrosis or nephrolithiasis on either side. The visualized ureters and urinary bladder appear unremarkable. Stomach/Bowel: There is no bowel obstruction or active inflammation. Normal appendix. Vascular/Lymphatic: Mild aortoiliac atherosclerotic disease. No portal venous gas. There is no adenopathy. Reproductive: Prostate and seminal vesicles are grossly unremarkable. No pelvic mass. Other: Small fat containing umbilical hernia. Musculoskeletal: Degenerative changes of the lower lumbar spine. No acute osseous pathology. IMPRESSION: No acute intra-abdominopelvic pathology. No hydronephrosis or nephrolithiasis. Electronically Signed   By: Anner Crete M.D.   On: 08/28/2018 01:19     Medications:   .  sodium bicarbonate  infusion 1000 mL 50 mL/hr at 05/31/15 0109   . folic acid  1 mg Oral Daily  . heparin injection (subcutaneous)  5,000 Units Subcutaneous Q8H  . hydrALAZINE  25 mg Oral Q8H  . labetalol  200 mg Oral BID  . multivitamin with minerals  1 tablet Oral Daily  . terazosin  5 mg Oral QHS  . thiamine  100 mg Oral Daily   Or  . thiamine  100 mg Intravenous Daily   acetaminophen **OR** acetaminophen, hydrALAZINE, LORazepam **OR** LORazepam, ondansetron **OR** ondansetron (ZOFRAN)  IV  Assessment/ Plan:   Progressive chronic renal insufficiency secondary to uncontrolled hypertension this represents acute on chronic renal failure it appears that he is noncompliant with his antihypertensive regimen.  He has no obstruction on CT scan urinalysis large amounts of protein no WBC or RBCs seen.  It appears that he has stage V advanced chronic renal insufficiency and have discussed importance of starting dialysis.  Wife is at bedside.  Patient does not wish to undergo dialysis.  He appears to understand that without dialysis he will die.  I will recommend hospice and palliative care are involved with the family.  Discussed his case with Dr. Tana Coast.    Hypertension we will add Norvasc 10 mg daily already on labetalol 200 mg twice daily  Hyperkalemia mild this morning we will give patient Kayexalate 30 g  Low-grade fever blood cultures negative so far flu swab negative for flu  Bones PTH pending  Anemia not issue at this time  We will continue to follow patient pending ongoing discussions with palliative medicine.   LOS: [place] @TODAY @11 :35 AM

## 2018-08-29 NOTE — Progress Notes (Signed)
*  PRELIMINARY RESULTS* Vascular Ultrasound Renal Artery Duplex has been completed.   Study was technically limited and difficult due to patient body habitus. No obvious evidence of hemodynamically significant renal artery stenosis. However, given technical limitations study was grossly inconclusive. Bilateral kidneys exhibit increased echogenicity, suggestive of medical renal disease.  08/29/2018 9:14 AM Maudry Mayhew, MHA, RVT, RDCS, RDMS

## 2018-08-29 NOTE — Progress Notes (Signed)
24hr Urine delivered to lab.  Arnell Sieving, RN, BSN

## 2018-08-30 ENCOUNTER — Telehealth: Payer: Self-pay

## 2018-08-30 LAB — AMMONIA: Ammonia: 23 umol/L (ref 9–35)

## 2018-08-30 LAB — GLUCOSE, CAPILLARY
GLUCOSE-CAPILLARY: 109 mg/dL — AB (ref 70–99)
GLUCOSE-CAPILLARY: 114 mg/dL — AB (ref 70–99)
GLUCOSE-CAPILLARY: 120 mg/dL — AB (ref 70–99)
GLUCOSE-CAPILLARY: 130 mg/dL — AB (ref 70–99)
Glucose-Capillary: 116 mg/dL — ABNORMAL HIGH (ref 70–99)
Glucose-Capillary: 123 mg/dL — ABNORMAL HIGH (ref 70–99)
Glucose-Capillary: 97 mg/dL (ref 70–99)

## 2018-08-30 LAB — CBC
HEMATOCRIT: 31.1 % — AB (ref 39.0–52.0)
HEMOGLOBIN: 10 g/dL — AB (ref 13.0–17.0)
MCH: 28 pg (ref 26.0–34.0)
MCHC: 32.2 g/dL (ref 30.0–36.0)
MCV: 87.1 fL (ref 78.0–100.0)
Platelets: 236 10*3/uL (ref 150–400)
RBC: 3.57 MIL/uL — AB (ref 4.22–5.81)
RDW: 13.3 % (ref 11.5–15.5)
WBC: 7.4 10*3/uL (ref 4.0–10.5)

## 2018-08-30 LAB — BASIC METABOLIC PANEL
ANION GAP: 12 (ref 5–15)
BUN: 83 mg/dL — AB (ref 8–23)
CO2: 27 mmol/L (ref 22–32)
Calcium: 7.7 mg/dL — ABNORMAL LOW (ref 8.9–10.3)
Chloride: 99 mmol/L (ref 98–111)
Creatinine, Ser: 12 mg/dL — ABNORMAL HIGH (ref 0.61–1.24)
GFR calc Af Amer: 4 mL/min — ABNORMAL LOW (ref >60)
GFR, EST NON AFRICAN AMERICAN: 4 mL/min — AB (ref >60)
Glucose, Bld: 111 mg/dL — ABNORMAL HIGH (ref 70–99)
POTASSIUM: 4.8 mmol/L (ref 3.5–5.1)
SODIUM: 138 mmol/L (ref 135–145)

## 2018-08-30 LAB — BLOOD GAS, ARTERIAL
Acid-Base Excess: 6.6 mmol/L — ABNORMAL HIGH (ref 0.0–2.0)
BICARBONATE: 30.4 mmol/L — AB (ref 20.0–28.0)
Drawn by: 277361
FIO2: 21
O2 Saturation: 98.4 %
PCO2 ART: 42 mmHg (ref 32.0–48.0)
PH ART: 7.472 — AB (ref 7.350–7.450)
PO2 ART: 107 mmHg (ref 83.0–108.0)
Patient temperature: 98.6

## 2018-08-30 NOTE — Progress Notes (Addendum)
Progress Note  Patient Name: Devin Harmon Date of Encounter: 08/30/2018  Primary Cardiologist: No primary care provider on file.   Subjective   The patient is minimally interactive.  He denies chest pain or difficulty breathing.  His wife is present and is pleased with improved blood pressure.  She still expresses desire to avoid dialysis for the patient.  Inpatient Medications    Scheduled Meds: . amLODipine  10 mg Oral Daily  . fluticasone  1 spray Each Nare Daily  . folic acid  1 mg Oral Daily  . heparin injection (subcutaneous)  5,000 Units Subcutaneous Q8H  . hydrALAZINE  25 mg Oral Q8H  . labetalol  200 mg Oral BID  . multivitamin with minerals  1 tablet Oral Daily  . sodium polystyrene  30 g Oral Once  . terazosin  5 mg Oral QHS  . thiamine  100 mg Oral Daily   Or  . thiamine  100 mg Intravenous Daily   Continuous Infusions: .  sodium bicarbonate  infusion 1000 mL 50 mL/hr at 08/29/18 2153   PRN Meds: acetaminophen **OR** acetaminophen, hydrALAZINE, ipratropium-albuterol, LORazepam **OR** LORazepam, ondansetron **OR** ondansetron (ZOFRAN) IV   Vital Signs    Vitals:   08/29/18 1129 08/29/18 1154 08/29/18 1750 08/30/18 0410  BP: (!) 189/105 (!) 185/101 (!) 151/95   Pulse: 77 79 82   Resp: (!) 22     Temp: 98.5 F (36.9 C)  99.1 F (37.3 C)   TempSrc: Oral  Oral   SpO2: 91%  94%   Weight:    86.1 kg  Height:        Intake/Output Summary (Last 24 hours) at 08/30/2018 0946 Last data filed at 08/30/2018 0410 Gross per 24 hour  Intake 320 ml  Output 1075 ml  Net -755 ml   Filed Weights   08/28/18 0352 08/29/18 0500 08/30/18 0410  Weight: 87.4 kg 85 kg 86.1 kg    Telemetry    Sinus rhythm 60s-70s- Personally Reviewed  ECG    No new tracings- Personally Reviewed  Physical Exam   GEN: No acute distress.   Neck:  Minimal JVD Cardiac: RRR, no murmurs, rubs, or gallops.  Respiratory: Clear to auscultation bilaterally.  GI: Soft, nontender,  non-distended  MS: No edema; No deformity. Neuro:   Patient is not very communicative Psych: Flat affect  Labs    Chemistry Recent Labs  Lab 08/28/18 0448 08/29/18 0302 08/30/18 0249  NA 139 139 138  K 4.5 5.7* 4.8  CL 105 104 99  CO2 18* 24 27  GLUCOSE 89 128* 111*  BUN 88* 87* 83*  CREATININE 11.60* 11.69* 12.00*  CALCIUM 8.7* 8.2* 7.7*  PROT 6.8  --   --   ALBUMIN 3.9  --   --   AST 18  --   --   ALT 13  --   --   ALKPHOS 59  --   --   BILITOT 0.7  --   --   GFRNONAA 4* 4* 4*  GFRAA 5* 5* 4*  ANIONGAP 16* 11 12     Hematology Recent Labs  Lab 08/28/18 0448 08/29/18 0302 08/30/18 0249  WBC 8.4 8.3 7.4  RBC 3.99* 3.82* 3.57*  HGB 11.2* 10.8* 10.0*  HCT 34.7* 33.0* 31.1*  MCV 87.0 86.4 87.1  MCH 28.1 28.3 28.0  MCHC 32.3 32.7 32.2  RDW 13.2 13.5 13.3  PLT 255 249 236    Cardiac Enzymes Recent Labs  Lab 08/28/18  0448 08/28/18 0919 08/28/18 1441  TROPONINI 1.52* 1.42* 1.22*    Recent Labs  Lab 08/28/18 0048  TROPIPOC 1.97*     BNPNo results for input(s): BNP, PROBNP in the last 168 hours.   DDimer No results for input(s): DDIMER in the last 168 hours.   Radiology    No results found.  Cardiac Studies   Echocardiogram 08/29/2018 Study Conclusions  - Left ventricle: The cavity size was normal. There was moderate   concentric hypertrophy. Systolic function was normal. The   estimated ejection fraction was in the range of 55% to 60%. Wall   motion was normal; there were no regional wall motion   abnormalities. Doppler parameters are consistent with abnormal   left ventricular relaxation (grade 1 diastolic dysfunction).   Doppler parameters are consistent with elevated mean left atrial   filling pressure. - Left atrium: The atrium was severely dilated.  Patient Profile     62 y.o. male with a history of chronic kidney disease, hypertension (recent diagnosis), who presented initially for evaluation of nasal/chest congestion and  generalized weakness.  Found to marked hypertension with systolics as high as 712W.  Elevated troponin felt to be demand ischemia.  Patient had not been taking his medications.  No chest pain.  Assessment & Plan    Hypertensive urgency -Patient had been recently started on lisinopril but was not compliant with his home medications per his family. -He is on hydralazine and labetalol.  Norvasc 10 mg added by nephrology -Blood pressures improved but still elevated, 151/95 last night.  No blood pressure in epic this morning.  Acute kidney injury on CKD stage III -Had recently been started on lisinopril -Serum creatinine was 11.33 on admission, 12 today -There is urine output recorded in epic-1.6 L yesterday -Renal artery stenosis ruled out -Nephrology was consulted who recommended dialysis, but the patient and currently declining dialysis.  His wife is waiting to see if his kidney function improves with improvement in blood pressure. -Palliative care has been contacted for discussion of goals of care with family  Elevated troponin -Troponin was 1.97 at presentation, trended down to 1.22 -Felt to be related to demand ischemia in setting of severe hypertension. -Echo done yesterday showed normal LVEF, no regional wall motion abnormalities, moderate concentric hypertrophy -The patient is not currently a candidate for invasive procedure due to his renal function   For questions or updates, please contact [place] Please consult [email or web address] for contact info under        Signed, Daune Perch, NP  08/30/2018, 9:46 AM    -------------------------------------------------------------------------------- History and all data above reviewed.  Patient examined.  I agree with the findings as above.  All available labs, radiology testing, previous records reviewed. Agree with documented assessment and plan.  I visited Mr. Shepherd with his wife and his brother present.  The patient is obtunded  and not responsive to sternal rub.  His family states that he has been sleeping for most of the afternoon but was more alert earlier this morning.  Documentation from other medical providers suggest he was minimally oriented, and concern was raised for uremia.  I spoke to Dr. Johnney Ou with nephrology as well as Dr. Tana Coast with internal medicine.  They agreed to that his level of mentation was concerning for progressive renal failure.  Lengthy discussions have been had by both teams about dialysis, however his family would like to avoid dialysis per documentation.  He has no active cardiovascular issues with his blood pressure  close to his baseline of 797 mmHg systolic, well-controlled pulse, and no obvious pericardial rub suggestive of uremia.  Gen: obtunded, not responsive to sternal rub. CV: no pericardial rub, regular rhythm and normal rate Lungs: Unable to examine posterior lung fields, clear anteriorly Abdomen: Soft and nontender, positive bowel sounds Extremities: Warm and well-perfused, no edema Neuro: Unresponsive to sternal rub, unable to assess otherwise Psych: Not oriented to person place or time due to obtunded state.  Principal Problem:   Acute renal failure (HCC) Active Problems:   Non-ST elevated myocardial infarction [facility])   Hypertensive urgency  I agree with my colleagues who have seen the patient previously that his troponin elevation is likely secondary to demand in acute renal failure.  His blood pressure is better controlled today and at his elevated baseline, however I am quite concerned about his level of consciousness.  I have shared this concern with both nephrology and internal medicine, and they agree.  From a cardiovascular standpoint, no changes are necessary at this time as there is a more pressing issue regarding his renal failure and mentation.  Though he is making urine, this change over the course of today may warrant a repeat conversation about dialysis.  I will defer  this discussion to the experts in nephrology as well as his primary service internal medicine team.    Elouise Munroe  2:31 PM  08/30/2018

## 2018-08-30 NOTE — Progress Notes (Signed)
Bladder scanned patient , 350 cc's in bladder. Patient's wife refused foley catheter because it has not been 6 hours since foley removal. She would like to wait until 7pm to reassess.

## 2018-08-30 NOTE — Telephone Encounter (Signed)
Saw in the phone notes this morning.  Patient was admitted ( and is still) in the hospital for cardiac and renal complications.  Appears that patient is in renal failure.  Will await hospital discharge information in order to follow up as needed.

## 2018-08-30 NOTE — Telephone Encounter (Signed)
Per chart review tab pt was admitted to [facility] on 08/27/18.

## 2018-08-30 NOTE — Progress Notes (Addendum)
[facility] Progress Note   Subjective:   Patient denies complaints but is very sleeping during conversation.   Spoke with wife about recent events and hospitalization at length.   Objective Vitals:   08/29/18 1129 08/29/18 1154 08/29/18 1750 08/30/18 0410  BP: (!) 189/105 (!) 185/101 (!) 151/95   Pulse: 77 79 82   Resp: (!) 22     Temp: 98.5 F (36.9 C)  99.1 F (37.3 C)   TempSrc: Oral  Oral   SpO2: 91%  94%   Weight:    86.1 kg  Height:       Physical Exam General: sleeping at 45 degrees, no distress Heart: RRR, no rub Lungs: normal WOB, no rales Abdomen: soft, nontender, +BS Extremities: no edema Neuro: sleepy, arouses to shoulder shake but does not participate in conversation at all  Additional Objective Labs: Basic Metabolic Panel: Recent Labs  Lab 08/28/18 0448 08/29/18 0302 08/30/18 0249  NA 139 139 138  K 4.5 5.7* 4.8  CL 105 104 99  CO2 18* 24 27  GLUCOSE 89 128* 111*  BUN 88* 87* 83*  CREATININE 11.60* 11.69* 12.00*  CALCIUM 8.7* 8.2* 7.7*  PHOS 6.9*  --   --    Liver Function Tests: Recent Labs  Lab 08/28/18 0448  AST 18  ALT 13  ALKPHOS 59  BILITOT 0.7  PROT 6.8  ALBUMIN 3.9   No results for input(s): LIPASE, AMYLASE in the last 168 hours. CBC: Recent Labs  Lab 08/27/18 2132 08/28/18 0448 08/29/18 0302 08/30/18 0249  WBC 9.1 8.4 8.3 7.4  NEUTROABS  --  6.4  --   --   HGB 12.1* 11.2* 10.8* 10.0*  HCT 37.3* 34.7* 33.0* 31.1*  MCV 88.2 87.0 86.4 87.1  PLT 283 255 249 236   Blood Culture    Component Value Date/Time   SDES BLOOD LEFT ANTECUBITAL 08/28/2018 1445   SPECREQUEST  08/28/2018 1445    BOTTLES DRAWN AEROBIC ONLY Blood Culture adequate volume   CULT  08/28/2018 1445    NO GROWTH < 24 HOURS Performed at [facility], [place] [address] [place], [place] [address]    REPTSTATUS PENDING 08/28/2018 1445    Cardiac Enzymes: Recent Labs  Lab 08/28/18 0448 08/28/18 0919 08/28/18 1441  TROPONINI  1.52* 1.42* 1.22*   CBG: Recent Labs  Lab 08/29/18 1619 08/29/18 2029 08/30/18 0007 08/30/18 0407 08/30/18 0830  GLUCAP 116* 118* 109* 114* 97   Iron Studies: No results for input(s): IRON, TIBC, TRANSFERRIN, FERRITIN in the last 72 hours. @lablastinr3 @ Studies/Results: No results found. Medications: .  sodium bicarbonate  infusion 1000 mL 50 mL/hr at 08/29/18 2153   . amLODipine  10 mg Oral Daily  . fluticasone  1 spray Each Nare Daily  . folic acid  1 mg Oral Daily  . heparin injection (subcutaneous)  5,000 Units Subcutaneous Q8H  . hydrALAZINE  25 mg Oral Q8H  . labetalol  200 mg Oral BID  . multivitamin with minerals  1 tablet Oral Daily  . sodium polystyrene  30 g Oral Once  . terazosin  5 mg Oral QHS  . thiamine  100 mg Oral Daily   Or  . thiamine  100 mg Intravenous Daily     Assessment/Plan: 1.  AKI on CKD versus CKD:  No recent labs for comparison, but has had CKD for years with heavy proteinuria back as far as 2011 on review of chart.  I suspect there is little reversible  component to this, though it's possible with better BP control he may see some improvement. He has no emergent indications for dialysis but question if his lethargy is related to uremia.  His wife has noted weight loss recently which I also wonder if is related to uremia.  I have discussed dialysis with her today including indications.  Given he cannot participate in the conversation we explored what his wishes have been in the past and it sounds like when dialysis was brought up over the years with nephrology and PCP he has always clearly stated it was not a procedure he would be willing to undergo.  Per notes from yesterday palliative care is being consulted too and they may be able to help explore this as well.     She requests the foley be removed and I think this is reasonable.  Either condom cath or bedside urinal can be used to collect I/Os.   2.  Anemia:  Hb 10, no indication for ESA.   3.   BMM: secondary hyperPTH, hyperphosphatemia.    4.  HTN:  Per wife baseline BP in the 160s, only recently > 200.  BP in 160s this AM on labetalol 200 BID and norvasc 10.  Would aim to keep BP in the 160s for now.   He had adrenal thickening on imaging and pheochromocytoma w/u is pending.  Renal US/dopplers not suggestive of RAS.   5.  Hyperkalemia: K 5.7 yesterday. Resolved with kayexelate.   Jannifer Hick MD 08/30/2018, 10:18 AM  [facility] Pager: [phone]  ADDENDUM:  Received a call from cardiology that the patient was less responsive and that his wife was inquiring about kidney transplantation.  I went to reevaluate Mr. Standre who was able to around to voice and a light shake of his leg which was the same as earlier.  He still was unable to participate in conversation with me.  In plain language I discussed with his wife again that his mental status may be related to uremia and without dialysis, unless his kidneys improved, it would likely worsen and could result in death.  She was able to verbalize this back to me and understands this.  Kidney transplant is not currently a treatment option and that was discussed as well.  She stated she knew that but only asked the question because her children requested she ask.  I also discussed the case with Dr. Tana Coast who presented to the bedside to discuss as well.     He has had no UOP since foley removed so we are checking a bladder scan and will reinsert foley if he is retaining urine now.  His wife is agreeable.

## 2018-08-30 NOTE — Progress Notes (Addendum)
[facility]                                                                              Patient Demographics  Devin Harmon, is a 62 y.o. male, DOB - 10-02-1956, GGY:[id number]  Admit date - 08/27/2018   Admitting Physician Rise Patience, MD  Outpatient Primary MD for the patient is Venia Carbon, MD  Outpatient specialists:   LOS - 2  days   Medical records reviewed and are as summarized below:    Chief Complaint  Patient presents with  . Cough  . Nasal Congestion  . Weakness  . Hypertension       Brief summary   Patient is a 62 year old male with hypertension, noncompliant, CKD stage III, BPH presented to ED with chest congestion ongoing for last 2 to 3 days.  No nausea vomiting diarrhea, chest pain, shortness of breath.  Per patient's family, he has not been taking his antihypertensives, he was placed on lisinopril but he stopped it. In ED, patient's BP was > 035 systolic.  Elevated creatinine 11.3, potassium 6.4, troponin I 0.97.  Nephrology and cardiology were consulted.  Assessment & Plan    Principal Problem:   Acute renal failure (HCC) on CKD stage III with marked worsening of creatinine, hyperkalemia, hypertensive urgency -CT renal study does not show any obstruction.  -Foley catheter placed for urinary retention -Nephrology consulted, hold off on lisinopril. -Renal artery Doppler showed no significant renal artery stenosis, bilateral kidneys exhibit increased echogenicity suggestive of CKD -Patient is confused, oriented to self and person  (wife), however per wife he is usually "like this" in the mornings, creatinine trended up to 12.  Patient cannot participate during the encounter due to his mental status.  Per wife at the bedside, she and the children will compare the results from his previous labs in the last few years and then discus. -Nephrology recommended hemodialysis, however patient's wife thinks he would not need it.   [place] [facility], Dr. Caprice Red recommendations, palliative has been consulted.  Active Problems:   Non-ST elevated myocardial infarction [facility]) -Currently no chest pain or shortness of breath.   -2D echo showed EF of 55 to 00%, grade 1 diastolic dysfunction, left atrium severely dilated -Per cardiology, elevated troponins likely due to demand ischemia and not ACS.  Not currently a candidate for any cardiovascular procedures in the setting of renal failure.    Hypertensive urgency -Patient has been noncompliant with his antihypertensives per family. -BP still elevated, Norvasc added, continue hydralazine, labetalol -Ruled out renal artery stenosis  BPH -Postvoid residual over 300, Foley catheter placed -Continue terazosin  Addendum: 3:15pm Called by cardiology MD that patient was obtunded.  Ordered ABG and ammonia level.  Dr. Johnney Ou and myself saw the patient.  Currently he is alert and awake and at the same baseline he was in the morning.  Talking to his wife.  Per patient's wife he was sleepy and somewhat exhausted as there family had visited and after lunch. -Discussed in detail with the patient's wife regarding the CODE STATUS, given that they do not want hemodialysis at this time.  Patient's wife stated  that patient had clear wishes of being DNR/DNI, no hemodialysis or any life support measures/interventions. -CODE STATUS changed to DNR -Dr. Johnney Ou also discussed with patient's wife guarding hemodialysis.  Foley catheter removed however patient did not have any urine.  Bladder scan ordered.  Code Status: DNR DNI  DVT Prophylaxis: Heparin drip Family Communication: Discussed in detail with the patient, all imaging results, lab results explained to the patient's wife at the bedside   Disposition Plan:  Unclear disposition for now, palliative consult called  Time Spent in minutes 25 minutes  Procedures:  CT abdomen 2D echo Renal duplex  Consultants:    Nephrology Cardiology  Antimicrobials:      Medications  Scheduled Meds: . amLODipine  10 mg Oral Daily  . fluticasone  1 spray Each Nare Daily  . folic acid  1 mg Oral Daily  . heparin injection (subcutaneous)  5,000 Units Subcutaneous Q8H  . hydrALAZINE  25 mg Oral Q8H  . labetalol  200 mg Oral BID  . multivitamin with minerals  1 tablet Oral Daily  . sodium polystyrene  30 g Oral Once  . terazosin  5 mg Oral QHS  . thiamine  100 mg Oral Daily   Or  . thiamine  100 mg Intravenous Daily   Continuous Infusions: .  sodium bicarbonate  infusion 1000 mL 50 mL/hr at 08/29/18 2153   PRN Meds:.acetaminophen **OR** acetaminophen, hydrALAZINE, ipratropium-albuterol, LORazepam **OR** LORazepam, ondansetron **OR** ondansetron (ZOFRAN) IV   Antibiotics   Anti-infectives (From admission, onward)   None        Subjective:   Devin Harmon was seen and examined today.  Patient is alert and awake however not able to participate in conversations, oriented to self.  Unable to obtain review of system from the patient.  Objective:   Vitals:   08/29/18 1129 08/29/18 1154 08/29/18 1750 08/30/18 0410  BP: (!) 189/105 (!) 185/101 (!) 151/95   Pulse: 77 79 82   Resp: (!) 22     Temp: 98.5 F (36.9 C)  99.1 F (37.3 C)   TempSrc: Oral  Oral   SpO2: 91%  94%   Weight:    86.1 kg  Height:        Intake/Output Summary (Last 24 hours) at 08/30/2018 1326 Last data filed at 08/30/2018 1302 Gross per 24 hour  Intake 330 ml  Output 1675 ml  Net -1345 ml     Wt Readings from Last 3 Encounters:  08/30/18 86.1 kg  08/16/18 92.5 kg  08/05/18 92.1 kg     Exam  General: Alert and oriented to self Eyes: HEENT:  Atraumatic, normocephalic Cardiovascular: S1 S2 auscultated, Regular rate and rhythm. No pedal edema b/l Respiratory: Clear to auscultation bilaterally, no wheezing, rales or rhonchi Gastrointestinal: Soft, nontender, nondistended, + bowel sounds Ext: no pedal edema  bilaterally Neuro: moving all 4 extrema Musculoskeletal: No digital cyanosis, clubbing Skin: No rashes Psych: oriented to self only, flat affect, not very conversant      Data Reviewed:  I have personally reviewed following labs and imaging studies  Micro Results Recent Results (from the past 240 hour(s))  Culture, blood (routine x 2)     Status: None (Preliminary result)   Collection Time: 08/28/18  2:30 PM  Result Value Ref Range Status   Specimen Description BLOOD RIGHT HAND  Final   Special Requests   Final    BOTTLES DRAWN AEROBIC ONLY Blood Culture adequate volume   Culture   Final  NO GROWTH < 24 HOURS Performed at [facility] [address], [place], [place] [address]    Report Status PENDING  Incomplete  Culture, blood (routine x 2)     Status: None (Preliminary result)   Collection Time: 08/28/18  2:45 PM  Result Value Ref Range Status   Specimen Description BLOOD LEFT ANTECUBITAL  Final   Special Requests   Final    BOTTLES DRAWN AEROBIC ONLY Blood Culture adequate volume   Culture   Final    NO GROWTH < 24 HOURS Performed at [facility], [place] [address], [place], [place] [address]    Report Status PENDING  Incomplete    Radiology Reports Dg Chest 2 View  Result Date: 08/27/2018 CLINICAL DATA:  61 year old male with cough EXAM: CHEST - 2 VIEW COMPARISON:  None. FINDINGS: The lungs are clear. There is no pleural effusion or pneumothorax. Borderline cardiomegaly. No acute osseous pathology. IMPRESSION: No active cardiopulmonary disease. Electronically Signed   By: Anner Crete M.D.   On: 08/27/2018 23:44   Ct Renal Stone Study  Result Date: 08/28/2018 CLINICAL DATA:  62 year old male with abdominal pain. EXAM: CT ABDOMEN AND PELVIS WITHOUT CONTRAST TECHNIQUE: Multidetector CT imaging of the abdomen and pelvis was performed following the standard protocol without IV contrast. COMPARISON:  None. FINDINGS: Evaluation of this exam is limited in  the absence of intravenous contrast. Evaluation is also limited due to respiratory motion artifact. Lower chest: The visualized lung bases are clear. There is cardiomegaly and coronary vascular calcification. No intra-abdominal free air or free fluid. Hepatobiliary: No focal liver abnormality is seen. No gallstones, gallbladder wall thickening, or biliary dilatation. Pancreas: Unremarkable. No pancreatic ductal dilatation or surrounding inflammatory changes. Spleen: Normal in size without focal abnormality. Adrenals/Urinary Tract: Mild bilateral adrenal thickening. There is a subcentimeter exophytic lesion from the anterolateral aspect of the right kidney which is too small to characterize. There is no hydronephrosis or nephrolithiasis on either side. The visualized ureters and urinary bladder appear unremarkable. Stomach/Bowel: There is no bowel obstruction or active inflammation. Normal appendix. Vascular/Lymphatic: Mild aortoiliac atherosclerotic disease. No portal venous gas. There is no adenopathy. Reproductive: Prostate and seminal vesicles are grossly unremarkable. No pelvic mass. Other: Small fat containing umbilical hernia. Musculoskeletal: Degenerative changes of the lower lumbar spine. No acute osseous pathology. IMPRESSION: No acute intra-abdominopelvic pathology. No hydronephrosis or nephrolithiasis. Electronically Signed   By: Anner Crete M.D.   On: 08/28/2018 01:19    Lab Data:  CBC: Recent Labs  Lab 08/27/18 2132 08/28/18 0448 08/29/18 0302 08/30/18 0249  WBC 9.1 8.4 8.3 7.4  NEUTROABS  --  6.4  --   --   HGB 12.1* 11.2* 10.8* 10.0*  HCT 37.3* 34.7* 33.0* 31.1*  MCV 88.2 87.0 86.4 87.1  PLT 283 255 249 160   Basic Metabolic Panel: Recent Labs  Lab 08/27/18 2132 08/28/18 0448 08/29/18 0302 08/30/18 0249  NA 137 139 139 138  K 6.4* 4.5 5.7* 4.8  CL 105 105 104 99  CO2 17* 18* 24 27  GLUCOSE 90 89 128* 111*  BUN 85* 88* 87* 83*  CREATININE 11.33* 11.60* 11.69* 12.00*   CALCIUM 8.4* 8.7* 8.2* 7.7*  MG  --  2.0  --   --   PHOS  --  6.9*  --   --    GFR: Estimated Creatinine Clearance: 6.7 mL/min (A) (by C-G formula based on SCr of 12 mg/dL (H)). Liver Function Tests: Recent Labs  Lab  08/28/18 0448  AST 18  ALT 13  ALKPHOS 59  BILITOT 0.7  PROT 6.8  ALBUMIN 3.9   No results for input(s): LIPASE, AMYLASE in the last 168 hours. No results for input(s): AMMONIA in the last 168 hours. Coagulation Profile: No results for input(s): INR, PROTIME in the last 168 hours. Cardiac Enzymes: Recent Labs  Lab 08/28/18 0448 08/28/18 0919 08/28/18 1441  TROPONINI 1.52* 1.42* 1.22*   BNP (last 3 results) No results for input(s): PROBNP in the last 8760 hours. HbA1C: No results for input(s): HGBA1C in the last 72 hours. CBG: Recent Labs  Lab 08/29/18 2029 08/30/18 0007 08/30/18 0407 08/30/18 0830 08/30/18 1157  GLUCAP 118* 109* 114* 97 130*   Lipid Profile: No results for input(s): CHOL, HDL, LDLCALC, TRIG, CHOLHDL, LDLDIRECT in the last 72 hours. Thyroid Function Tests: Recent Labs    08/28/18 0448  TSH 1.687   Anemia Panel: No results for input(s): VITAMINB12, FOLATE, FERRITIN, TIBC, IRON, RETICCTPCT in the last 72 hours. Urine analysis:    Component Value Date/Time   COLORURINE STRAW (A) 08/27/2018 2129   APPEARANCEUR CLEAR 08/27/2018 2129   LABSPEC 1.011 08/27/2018 2129   PHURINE 6.0 08/27/2018 2129   GLUCOSEU 50 (A) 08/27/2018 2129   HGBUR SMALL (A) 08/27/2018 2129   BILIRUBINUR NEGATIVE 08/27/2018 2129   KETONESUR 5 (A) 08/27/2018 2129   PROTEINUR >=300 (A) 08/27/2018 2129   NITRITE NEGATIVE 08/27/2018 2129   LEUKOCYTESUR NEGATIVE 08/27/2018 2129     Makana Feigel M.D. [facility] 08/30/2018, 1:26 PM  Pager: [phone] Between 7am to 7pm - call Pager - [phone]  After 7pm go to [email or web address] - password [email or web address]  Call night coverage person covering after 7pm

## 2018-08-30 NOTE — Telephone Encounter (Signed)
Thank you for the update!

## 2018-08-30 NOTE — Telephone Encounter (Signed)
Spoke to wife on the phone on Saturday to review his status In renal failure and doesn't seem to be reversing----looking into options but will likely need to start dialysis

## 2018-08-30 NOTE — Telephone Encounter (Signed)
PLEASE NOTE: All timestamps contained within this report are represented as [place] Standard Time. CONFIDENTIALTY NOTICE: This fax transmission is intended only for the addressee. It contains information that is legally privileged, confidential or otherwise protected from use or disclosure. If you are not the intended recipient, you are strictly prohibited from reviewing, disclosing, copying using or disseminating any of this information or taking any action in reliance on or regarding this information. If you have received this fax in error, please notify us immediately by telephone so that we can arrange for its return to [place]. Phone: [phone], Toll-Free: [phone], Fax: [phone] Page: 1 of 1 Call Id: [id number] [place] Night - Client Nonclinical Telephone Record [place] Night - Client Client Site [place] Physician Viviana Simpler - MD Contact Type Call Who Is Calling Physician / Provider / Hospital Call Type Provider Call [facility] Page Now Reason for Call Request to Admit or Transfer Patient Initial Comment Caller Dr. Justin Mend with [facility], needing consult on pt of MD [place]. Additional Comment Patient Name Devin Harmon Patient DOB n/a Requesting Provider Dr. Justin Mend Physician Number [id number] Facility Name [facility] Comments User: Caryl Comes Date/Time Eilene Ghazi Time): 08/28/2018 10:02:32 AM [place] office where on call MD is and the medical assistant took the message for him. Call Closed By: Caryl Comes Transaction Date/Time: 08/28/2018 8:54:02 AM (ET)

## 2018-08-31 ENCOUNTER — Other Ambulatory Visit: Payer: Self-pay

## 2018-08-31 ENCOUNTER — Telehealth: Payer: Self-pay

## 2018-08-31 DIAGNOSIS — N170 Acute kidney failure with tubular necrosis: Principal | ICD-10-CM

## 2018-08-31 DIAGNOSIS — Z515 Encounter for palliative care: Secondary | ICD-10-CM

## 2018-08-31 DIAGNOSIS — N183 Chronic kidney disease, stage 3 (moderate): Secondary | ICD-10-CM

## 2018-08-31 LAB — PROTEIN ELECTROPHORESIS, SERUM
A/G Ratio: 1.3 (ref 0.7–1.7)
ALPHA-1-GLOBULIN: 0.2 g/dL (ref 0.0–0.4)
Albumin ELP: 3.5 g/dL (ref 2.9–4.4)
Alpha-2-Globulin: 0.5 g/dL (ref 0.4–1.0)
Beta Globulin: 1.1 g/dL (ref 0.7–1.3)
GAMMA GLOBULIN: 0.9 g/dL (ref 0.4–1.8)
Globulin, Total: 2.7 g/dL (ref 2.2–3.9)
Total Protein ELP: 6.2 g/dL (ref 6.0–8.5)

## 2018-08-31 LAB — CBC
HCT: 33.3 % — ABNORMAL LOW (ref 39.0–52.0)
Hemoglobin: 10.7 g/dL — ABNORMAL LOW (ref 13.0–17.0)
MCH: 28.5 pg (ref 26.0–34.0)
MCHC: 32.1 g/dL (ref 30.0–36.0)
MCV: 88.6 fL (ref 78.0–100.0)
PLATELETS: 260 10*3/uL (ref 150–400)
RBC: 3.76 MIL/uL — AB (ref 4.22–5.81)
RDW: 13.4 % (ref 11.5–15.5)
WBC: 7.6 10*3/uL (ref 4.0–10.5)

## 2018-08-31 LAB — BASIC METABOLIC PANEL
Anion gap: 18 — ABNORMAL HIGH (ref 5–15)
BUN: 81 mg/dL — AB (ref 8–23)
CHLORIDE: 94 mmol/L — AB (ref 98–111)
CO2: 28 mmol/L (ref 22–32)
CREATININE: 12.71 mg/dL — AB (ref 0.61–1.24)
Calcium: 8 mg/dL — ABNORMAL LOW (ref 8.9–10.3)
GFR calc non Af Amer: 4 mL/min — ABNORMAL LOW (ref >60)
GFR, EST AFRICAN AMERICAN: 4 mL/min — AB (ref >60)
Glucose, Bld: 96 mg/dL (ref 70–99)
Potassium: 5.1 mmol/L (ref 3.5–5.1)
Sodium: 140 mmol/L (ref 135–145)

## 2018-08-31 LAB — GLUCOSE, CAPILLARY
GLUCOSE-CAPILLARY: 109 mg/dL — AB (ref 70–99)
GLUCOSE-CAPILLARY: 114 mg/dL — AB (ref 70–99)
Glucose-Capillary: 116 mg/dL — ABNORMAL HIGH (ref 70–99)
Glucose-Capillary: 105 mg/dL — ABNORMAL HIGH (ref 70–99)
Glucose-Capillary: 99 mg/dL (ref 70–99)

## 2018-08-31 LAB — CATECHOLAMINES,UR.,FREE,24 HR

## 2018-08-31 MED ORDER — LABETALOL HCL 100 MG PO TABS
100.0000 mg | ORAL_TABLET | Freq: Two times a day (BID) | ORAL | Status: DC
  Administered 2018-08-31 – 2018-09-01 (×3): 100 mg via ORAL
  Filled 2018-08-31 (×2): qty 1

## 2018-08-31 NOTE — Progress Notes (Addendum)
Consultation Note Date: 08/31/2018   Patient Name: Devin Harmon  DOB: March 16, 1956  MRN: [id number]  Age / Sex: 62 y.o., male  PCP: Venia Carbon, MD Referring Physician: Mendel Corning, MD  Reason for Consultation: Establishing goals of care and Psychosocial/spiritual support  HPI/Patient Profile: 62 y.o. male  with past medical history of chronic hypertension, CKD stage 3, and BPH who was admitted on 08/27/2018 with complaints of chest congestion.  He was found to have hypertensive emergency with acute on chronic kidney failure. His systolic BP was over 564 and his creatinine was 11.3 with a potassium of 6.4.  In the hospital his BP has been controlled and is now in the 140/90 range.  Unfortunately his urine output and PO intake have been very low.  Devin Harmon has remained encephalopathic due to uremia.   Nephrology has been following thru out the hospitalization and hemodialysis has been offered.  The family politely declines hemodialysis out of respect for the patient's previously expressed wishes.  Clinical Assessment and Goals of Care:  I have reviewed medical records including EPIC notes, labs and imaging, received report from the care team, assessed the patient and then met at the bedside along with [place] (patient's wife)  to discuss diagnosis prognosis, GOC, EOL wishes, disposition and options.  I introduced Palliative Medicine as specialized medical care for people living with serious illness. It focuses on providing relief from the symptoms and stress of a serious illness. The goal is to improve quality of life for both the patient and the family.  We discussed a brief life review of the patient. 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  He and [place] live on a small farm and Devin Harmon stays very active tending to the animals.  They have a son and daughter who live locally.  They  have multiple family members who live on the same street.  [place]tar tells me she is a Landscape architect and thrives on data.    [place]tar has researched each of the medications Areli is current on (labetalol, hydralazine, and amlodipine) and lists their side effects (dizzyness, excessive sleepiness, etc...).  Per [place] the medications being used to control Devin Harmon's blood pressure are responsible for his current encephalopathic state.   I explained that uremia and malignant hypertension also have side effects of dizzyness, sleepiness, etc...  [place] concedes that she understands kidney failure can cause these things - but she is convinced that the medications are at fault.  [place] states he has had renal failure and severe hypertension before and he improved.  As far as functional and nutritional status per [place], Devin Harmon was active and eating until recently when food started tasting differently to him.  He stopped eating and drinking much.  In the hospital Devin Harmon has remained very confused and sleeping most of the time.  He has been able to stand but has not walked.  He is eating bites and sips with encouragement from [place].  We discussed Zaivion's current illness and what it means  in the larger context of his larger on-going co-morbidities.  Natural disease trajectory and expectations at EOL were discussed.  [place] teared up and stated that she understood what I was saying but she was choosing to remain hopeful that he was going to improve.    I attempted to elicit values and goals of care important to the patient.  Vikas has been very clear in the past that he does not want hemo-dialysis, life support or feeding tube.  [place] expressed that Devin Harmon was ok with dying, he hated hospitals, and dying with dignity is very important to him.  We reviewed the MOST form together.  The difference between aggressive medical intervention and comfort care was considered in light of the patient's goals of care.  Hospice and Palliative Care services outpatient were explained and offered.  [place] and Devin Harmon have had multiple relatives on Hospice and she understands it is end of life care.   I encouraged her to think of hospice more as a home health agency that would provide comprehensive support  - and if he improved Hospice would disengage, but if he did not improve Hospice would be there to support her and care for him.  [place]tar agreed to think about it - but she expressed multiple times that she just wanted to get him home alone with her for 5 to 7 days.  She believes if she takes him home he will improve.   I talked with her about Palliative Care - which would check on her once a month - she was hesitant about that as well.  I offered a walker, bedside commode, etc.  [place]tar felt these things were not needed.  Devin Harmon has not walked since admission.  I asked [place] if I could order a physical therapy evaluation just to get him up and see how he does.  Initially she told me no - but then was agreeable.  Questions and concerns were addressed.  Hard Choices booklet left for review. The family was encouraged to call with questions or concerns.    Primary Decision Maker:  NEXT OF KIN Wife, [place]    SUMMARY OF RECOMMENDATIONS    I understand that [place] needs to maintain hope and I do not want to discourage that, however, I am concerned that she is setting herself up for failure and a fast return to the hospital.  She does not believe that uremia is responsible for his current symptoms.  She is convinced if she can just get him home alone - he will improve.  I put in a call to Dr. Silvio Pate (whom [place] and the patient trust very deeply).  I spoke with Larene Beach in hopes that perhaps Dr. Silvio Pate would have a moment to call [place] on the phone.  Perhaps she would take his advice if he explained that Devin Harmon is critically ill (perhaps dying) and that she will need support in the home.  Hospice is the most  comprehensive support we can offer.    I have ordered a PT and OT consultations although I'm not certain the patient will be awake enough to participate.  I am hopeful that the evaluations will demonstrate his functionality to [place].    Code Status/Advance Care Planning:  DNR   Symptom Management:   Per primary team.  Palliative Prophylaxis:   Delirium Protocol  Psycho-social/Spiritual:   Desire for further Chaplaincy support:  Yes.  Prognosis:  Less than 6 months given oliguria with a creatinine of 12.7 and GFR of  4 with no desire for hemodialysis.    Discharge Planning: To Be Determined  Home with palliative and home health vs home with hospice?      Primary Diagnoses: Present on Admission: . Non-ST elevated myocardial infarction ([place]) . Hypertensive urgency   I have reviewed the medical record, interviewed the patient and family, and examined the patient. The following aspects are pertinent.  Past Medical History:  Diagnosis Date  . Allergy   . BPH (benign prostatic hypertrophy)   . Hypertension   . Renal insufficiency   . [place] 2007   facial, hospital 2007   Social History   Socioeconomic History  . Marital status: Married    Spouse name: Not on file  . Number of children: 2  . Years of education: Not on file  . Highest education level: Not on file  Occupational History  . Occupation: Museum/gallery curator, building new homes  Social Needs  . Financial resource strain: Not on file  . Food insecurity:    Worry: Not on file    Inability: Not on file  . Transportation needs:    Medical: Not on file    Non-medical: Not on file  Tobacco Use  . Smoking status: Never Smoker  . Smokeless tobacco: Never Used  Substance and Sexual Activity  . Alcohol use: Yes    Comment: occasional  . Drug use: Not Currently  . Sexual activity: Not on file  Lifestyle  . Physical activity:    Days per week: Not on file    Minutes per session: Not on file  . Stress: Not on  file  Relationships  . Social connections:    Talks on phone: Not on file    Gets together: Not on file    Attends religious service: Not on file    Active member of club or organization: Not on file    Attends meetings of clubs or organizations: Not on file    Relationship status: Not on file  Other Topics Concern  . Not on file  Social History Narrative  . Not on file   Family History  Problem Relation Age of Onset  . Hypertension Mother   . Cancer Mother        lung cancer  . Hypertension Brother   . Heart disease Father   . Coronary artery disease Neg Hx   . Diabetes Neg Hx    Scheduled Meds: . amLODipine  10 mg Oral Daily  . fluticasone  1 spray Each Nare Daily  . heparin injection (subcutaneous)  5,000 Units Subcutaneous Q8H  . labetalol  100 mg Oral BID  . multivitamin with minerals  1 tablet Oral Daily  . sodium polystyrene  30 g Oral Once  . terazosin  5 mg Oral QHS  . thiamine  100 mg Oral Daily   Continuous Infusions: .  sodium bicarbonate  infusion 1000 mL 50 mL/hr at 08/30/18 2034   PRN Meds:.acetaminophen **OR** acetaminophen, hydrALAZINE, ipratropium-albuterol, ondansetron **OR** ondansetron (ZOFRAN) IV Allergies  Allergen Reactions  . Chlorthalidone     REACTION: felt "bad" and ED  . Hydrocodone Other (See Comments)    Nausea and dizzy  . Sulfonamide Derivatives     REACTION: unspecified   Review of Systems patient is lethargic/encephalopathic and does not speak with me.  Physical Exam  Well developed male, sleeping, de-sating repeatedly CV rrr resp short periods of apnea Abdomen obese soft.  Vital Signs: BP (!) 152/74 (BP Location: Right Arm)   Pulse 61  Temp 98.8 F (37.1 C) (Oral)   Resp 20   Ht 5' 8"  (1.727 m)   Wt 90.6 kg   SpO2 93%   BMI 30.37 kg/m  Pain Scale: 0-10   Pain Score: Asleep   SpO2: SpO2: 93 % O2 Device:SpO2: 93 % O2 Flow Rate: .   IO: Intake/output summary:   Intake/Output Summary (Last 24 hours) at  08/31/2018 1420 Last data filed at 08/31/2018 1100 Gross per 24 hour  Intake 760 ml  Output 355 ml  Net 405 ml    LBM: Last BM Date: 08/30/18 Baseline Weight: Weight: 92.5 kg Most recent weight: Weight: 90.6 kg     Palliative Assessment/Data: 20%     Time In: 9:00  Time Out: 10:00 Time In: 1:00 Time out:  2:30 Time Total: 2.5 hours. Greater than 50%  of this time was spent counseling and coordinating care related to the above assessment and plan.  Signed by: Florentina Jenny, PA-C Palliative Medicine Pager: [phone]  Please contact Palliative Medicine Team phone at [phone] for questions and concerns.  For individual provider: See Shea Evans

## 2018-08-31 NOTE — Progress Notes (Signed)
[facility] Progress Note   Subjective:    Patient remains sleepy but denies complaints.   Spoke with wife again at length about recent events.  Palliative care assisting with [place] discussion.   Objective Vitals:   08/30/18 1940 08/30/18 2316 08/31/18 0428 08/31/18 0754  BP: (!) 153/94 132/73 130/75 (!) 152/74  Pulse: 67 (!) 55 61 61  Resp: 20  20 20   Temp: 98.7 F (37.1 C) 99.1 F (37.3 C) 98.8 F (37.1 C)   TempSrc: Oral Oral Oral Oral  SpO2: 94% 95% 96% 93%  Weight:   90.6 kg   Height:       Physical Exam General: sleeping at 45 degrees, no distress Heart: RRR, no rub Lungs: normal WOB, no rales Abdomen: soft, nontender, +BS Extremities: no edema Neuro: sleepy, arouses to shoulder shake, mild asterixis  Additional Objective Labs: Basic Metabolic Panel: Recent Labs  Lab 08/28/18 0448 08/29/18 0302 08/30/18 0249 08/31/18 0342  NA 139 139 138 140  K 4.5 5.7* 4.8 5.1  CL 105 104 99 94*  CO2 18* 24 27 28   GLUCOSE 89 128* 111* 96  BUN 88* 87* 83* 81*  CREATININE 11.60* 11.69* 12.00* 12.71*  CALCIUM 8.7* 8.2* 7.7* 8.0*  PHOS 6.9*  --   --   --    Liver Function Tests: Recent Labs  Lab 08/28/18 0448  AST 18  ALT 13  ALKPHOS 59  BILITOT 0.7  PROT 6.8  ALBUMIN 3.9   No results for input(s): LIPASE, AMYLASE in the last 168 hours. CBC: Recent Labs  Lab 08/27/18 2132 08/28/18 0448 08/29/18 0302 08/30/18 0249 08/31/18 0342  WBC 9.1 8.4 8.3 7.4 7.6  NEUTROABS  --  6.4  --   --   --   HGB 12.1* 11.2* 10.8* 10.0* 10.7*  HCT 37.3* 34.7* 33.0* 31.1* 33.3*  MCV 88.2 87.0 86.4 87.1 88.6  PLT 283 255 249 236 260   Blood Culture    Component Value Date/Time   SDES BLOOD LEFT ANTECUBITAL 08/28/2018 1445   SPECREQUEST  08/28/2018 1445    BOTTLES DRAWN AEROBIC ONLY Blood Culture adequate volume   CULT  08/28/2018 1445    NO GROWTH 3 DAYS Performed at [facility] [address] [place], [place] [address]    REPTSTATUS PENDING  08/28/2018 1445    Cardiac Enzymes: Recent Labs  Lab 08/28/18 0448 08/28/18 0919 08/28/18 1441  TROPONINI 1.52* 1.42* 1.22*   CBG: Recent Labs  Lab 08/30/18 2128 08/30/18 2354 08/31/18 0431 08/31/18 1035 08/31/18 1131  GLUCAP 120* 116* 116* 105* 109*   Iron Studies: No results for input(s): IRON, TIBC, TRANSFERRIN, FERRITIN in the last 72 hours. @lablastinr3 @ Studies/Results: No results found. Medications: .  sodium bicarbonate  infusion 1000 mL 50 mL/hr at 08/30/18 2034   . amLODipine  10 mg Oral Daily  . fluticasone  1 spray Each Nare Daily  . heparin injection (subcutaneous)  5,000 Units Subcutaneous Q8H  . hydrALAZINE  25 mg Oral Q8H  . labetalol  100 mg Oral BID  . multivitamin with minerals  1 tablet Oral Daily  . sodium polystyrene  30 g Oral Once  . terazosin  5 mg Oral QHS  . thiamine  100 mg Oral Daily     Assessment/Plan: 1.  AKI on CKD versus CKD:  No recent labs for comparison, but has had CKD for years with heavy proteinuria back as far as 2011 on review of chart.  I suspect there is  little reversible component to this, though it's possible with better BP control he may see some improvement.  I believe he is having uremic symptoms (lethargy, wife notes what sounds like myoclonic jerks and dysgeusia, recent weight loss, asterixis).  His wife expressed to me that when dialysis was brought up over the years with nephrology and PCP he has always clearly stated it was not a procedure he would be willing to undergo and she understands that if this is progressive and he has no dialysis he will die from it.  She believes his symptoms and current clinical condition are coming from medications such as folic acid causing taste disturbance, labetalol causing fatigue, lisinopril cough, etc.  I believe it is uremia and have expressed this to her.  She seems to believe that if he is taken off medications and returns home he will improve.    He was having urinary retention on  removal of his foley catheter yesterday but his wife declined replacement of foley.  He currently has a condom cath on.  PVRs are being checked.    2.  Anemia:  Hb 10, no indication for ESA.   3.  BMM: secondary hyperPTH, hyperphosphatemia.  Holding on further management in light of more pressing issues -- intro  4.  HTN:  Per wife baseline BP in the 160s, only recently > 200.  BP in the past 24 hours is 130-160s.   Labetalol was decreased from 200 BID to 100 BID today.  Still on terazosin 5, amlodipine 10, hydralazine 25 TID.  Discontinue hydralazine as well.   He had adrenal thickening on imaging and pheochromocytoma w/u is pending.  Renal US/dopplers not suggestive of RAS.   5.  Hyperkalemia: K 5.7 yesterday. Resolved with kayexelate.   Disposition  - ongoing goals of care discussion.  Appreciate palliative care involvement to help clarify.    Jannifer Hick MD 08/31/2018, 12:24 PM  [facility] Pager: [phone]

## 2018-08-31 NOTE — Progress Notes (Signed)
[facility]                                                                              Patient Demographics  Devin Harmon, is a 62 y.o. male, DOB - June 28, 1956, ZOX:[id number]  Admit date - 08/27/2018   Admitting Physician Rise Patience, MD  Outpatient Primary MD for the patient is Venia Carbon, MD  Outpatient specialists:   LOS - 3  days   Medical records reviewed and are as summarized below:    Chief Complaint  Patient presents with  . Cough  . Nasal Congestion  . Weakness  . Hypertension       Brief summary   Patient is a 62 year old male with hypertension, noncompliant, CKD stage III, BPH presented to ED with chest congestion ongoing for last 2 to 3 days.  No nausea vomiting diarrhea, chest pain, shortness of breath.  Per patient's family, he has not been taking his antihypertensives, he was placed on lisinopril but he stopped it. In ED, patient's BP was > 811 systolic.  Elevated creatinine 11.3, potassium 6.4, troponin I 0.97.  Nephrology and cardiology were consulted.  Assessment & Plan    Principal Problem:   Acute renal failure (HCC) on CKD stage III with marked worsening of creatinine, hyperkalemia, hypertensive urgency -CT renal study does not show any obstruction.  -Nephrology consulted, hold off on lisinopril. -Renal artery Doppler showed no significant renal artery stenosis, bilateral kidneys exhibit increased echogenicity suggestive of CKD -Patient is having uremic symptoms, discussed by the nephrologist and myself with the patient's wife, she however feels that his current symptoms are from multiple medications. -Decrease labetalol, discontinued folic acid, Dr Johnney Ou discontinued hydralazine -Patient's wife at the bedside states that she would rather have the patient having high BP in 160s to 170s where he was "living" and was just on lisinopril.   -Patient's wife declined hemodialysis stating that he did not want the procedure,  CODE STATUS was discussed with the wife yesterday, DNR DNI. -Palliative medicine consulted for goals of care -Foley catheter was placed for urinary retention, on removal, did not void.  Wife declined replacement of Foley.   Active Problems:   Non-ST elevated myocardial infarction [facility]) -Currently no chest pain or shortness of breath.   -2D echo showed EF of 55 to 91%, grade 1 diastolic dysfunction, left atrium severely dilated -Per cardiology, elevated troponins likely due to demand ischemia and not ACS.  Not currently a candidate for any cardiovascular procedures in the setting of renal failure.    Hypertensive urgency -Patient has been noncompliant with his antihypertensives per family. -Hydralazine discontinued, labetalol decreased 200 mg twice a day, continue amlodipine, terazosin  BPH -Postvoid residual over 300, Foley catheter placed -Continue terazosin  Code Status: DNR DNI  DVT Prophylaxis: Heparin drip Family Communication: Discussed in detail with the patient, all imaging results, lab results explained to the patient's wife at the bedside   Disposition Plan: Unclear disposition for now, palliative consulted for goals of care  Time Spent in minutes 25 minutes  Procedures:  CT abdomen 2D echo Renal duplex  Consultants:   Nephrology Cardiology  Antimicrobials:  Medications  Scheduled Meds: . amLODipine  10 mg Oral Daily  . fluticasone  1 spray Each Nare Daily  . heparin injection (subcutaneous)  5,000 Units Subcutaneous Q8H  . labetalol  100 mg Oral BID  . multivitamin with minerals  1 tablet Oral Daily  . sodium polystyrene  30 g Oral Once  . terazosin  5 mg Oral QHS  . thiamine  100 mg Oral Daily   Continuous Infusions: .  sodium bicarbonate  infusion 1000 mL 50 mL/hr at 08/30/18 2034   PRN Meds:.acetaminophen **OR** acetaminophen, hydrALAZINE, ipratropium-albuterol, LORazepam **OR** LORazepam, ondansetron **OR** ondansetron (ZOFRAN)  IV   Antibiotics   Anti-infectives (From admission, onward)   None        Subjective:   Devin Harmon was seen and examined today.  Patient is alert and awake however not conversing, wife at the bedside.  Unable to obtain review of system from the patient.  Objective:   Vitals:   08/30/18 1940 08/30/18 2316 08/31/18 0428 08/31/18 0754  BP: (!) 153/94 132/73 130/75 (!) 152/74  Pulse: 67 (!) 55 61 61  Resp: 20  20 20   Temp: 98.7 F (37.1 C) 99.1 F (37.3 C) 98.8 F (37.1 C)   TempSrc: Oral Oral Oral Oral  SpO2: 94% 95% 96% 93%  Weight:   90.6 kg   Height:        Intake/Output Summary (Last 24 hours) at 08/31/2018 1300 Last data filed at 08/31/2018 1100 Gross per 24 hour  Intake 1000 ml  Output 955 ml  Net 45 ml     Wt Readings from Last 3 Encounters:  08/31/18 90.6 kg  08/16/18 92.5 kg  08/05/18 92.1 kg     Exam    General: Alert and awake,  Eyes:   HEENT:   Cardiovascular: S1 S2 auscultated, Regular rate and rhythm. No pedal edema b/l  Respiratory: Decreased breath sound at the bases  Gastrointestinal: Soft, nontender, nondistended, + bowel sounds  Ext: no pedal edema bilaterally  Neuro: moving all 4 extremities, mild asterixis  Musculoskeletal: No digital cyanosis, clubbing  Skin: No rashes  Psych: alert and awake, does not respond to orientation questions flat affect     Data Reviewed:  I have personally reviewed following labs and imaging studies  Micro Results Recent Results (from the past 240 hour(s))  Culture, blood (routine x 2)     Status: None (Preliminary result)   Collection Time: 08/28/18  2:30 PM  Result Value Ref Range Status   Specimen Description BLOOD RIGHT HAND  Final   Special Requests   Final    BOTTLES DRAWN AEROBIC ONLY Blood Culture adequate volume   Culture   Final    NO GROWTH 3 DAYS Performed at [facility], [address] [place], [place] [address]    Report Status PENDING  Incomplete   Culture, blood (routine x 2)     Status: None (Preliminary result)   Collection Time: 08/28/18  2:45 PM  Result Value Ref Range Status   Specimen Description BLOOD LEFT ANTECUBITAL  Final   Special Requests   Final    BOTTLES DRAWN AEROBIC ONLY Blood Culture adequate volume   Culture   Final    NO GROWTH 3 DAYS Performed at [facility], [place] [address], [place], [place] [address]    Report Status PENDING  Incomplete    Radiology Reports Dg Chest 2 View  Result Date: 08/27/2018 CLINICAL DATA:  62 year old male with cough EXAM:  CHEST - 2 VIEW COMPARISON:  None. FINDINGS: The lungs are clear. There is no pleural effusion or pneumothorax. Borderline cardiomegaly. No acute osseous pathology. IMPRESSION: No active cardiopulmonary disease. Electronically Signed   By: Anner Crete M.D.   On: 08/27/2018 23:44   Ct Renal Stone Study  Result Date: 08/28/2018 CLINICAL DATA:  62 year old male with abdominal pain. EXAM: CT ABDOMEN AND PELVIS WITHOUT CONTRAST TECHNIQUE: Multidetector CT imaging of the abdomen and pelvis was performed following the standard protocol without IV contrast. COMPARISON:  None. FINDINGS: Evaluation of this exam is limited in the absence of intravenous contrast. Evaluation is also limited due to respiratory motion artifact. Lower chest: The visualized lung bases are clear. There is cardiomegaly and coronary vascular calcification. No intra-abdominal free air or free fluid. Hepatobiliary: No focal liver abnormality is seen. No gallstones, gallbladder wall thickening, or biliary dilatation. Pancreas: Unremarkable. No pancreatic ductal dilatation or surrounding inflammatory changes. Spleen: Normal in size without focal abnormality. Adrenals/Urinary Tract: Mild bilateral adrenal thickening. There is a subcentimeter exophytic lesion from the anterolateral aspect of the right kidney which is too small to characterize. There is no hydronephrosis or nephrolithiasis on either side.  The visualized ureters and urinary bladder appear unremarkable. Stomach/Bowel: There is no bowel obstruction or active inflammation. Normal appendix. Vascular/Lymphatic: Mild aortoiliac atherosclerotic disease. No portal venous gas. There is no adenopathy. Reproductive: Prostate and seminal vesicles are grossly unremarkable. No pelvic mass. Other: Small fat containing umbilical hernia. Musculoskeletal: Degenerative changes of the lower lumbar spine. No acute osseous pathology. IMPRESSION: No acute intra-abdominopelvic pathology. No hydronephrosis or nephrolithiasis. Electronically Signed   By: Anner Crete M.D.   On: 08/28/2018 01:19    Lab Data:  CBC: Recent Labs  Lab 08/27/18 2132 08/28/18 0448 08/29/18 0302 08/30/18 0249 08/31/18 0342  WBC 9.1 8.4 8.3 7.4 7.6  NEUTROABS  --  6.4  --   --   --   HGB 12.1* 11.2* 10.8* 10.0* 10.7*  HCT 37.3* 34.7* 33.0* 31.1* 33.3*  MCV 88.2 87.0 86.4 87.1 88.6  PLT 283 255 249 236 557   Basic Metabolic Panel: Recent Labs  Lab 08/27/18 2132 08/28/18 0448 08/29/18 0302 08/30/18 0249 08/31/18 0342  NA 137 139 139 138 140  K 6.4* 4.5 5.7* 4.8 5.1  CL 105 105 104 99 94*  CO2 17* 18* 24 27 28   GLUCOSE 90 89 128* 111* 96  BUN 85* 88* 87* 83* 81*  CREATININE 11.33* 11.60* 11.69* 12.00* 12.71*  CALCIUM 8.4* 8.7* 8.2* 7.7* 8.0*  MG  --  2.0  --   --   --   PHOS  --  6.9*  --   --   --    GFR: Estimated Creatinine Clearance: 6.5 mL/min (A) (by C-G formula based on SCr of 12.71 mg/dL (H)). Liver Function Tests: Recent Labs  Lab 08/28/18 0448  AST 18  ALT 13  ALKPHOS 59  BILITOT 0.7  PROT 6.8  ALBUMIN 3.9   No results for input(s): LIPASE, AMYLASE in the last 168 hours. Recent Labs  Lab 08/30/18 1503  AMMONIA 23   Coagulation Profile: No results for input(s): INR, PROTIME in the last 168 hours. Cardiac Enzymes: Recent Labs  Lab 08/28/18 0448 08/28/18 0919 08/28/18 1441  TROPONINI 1.52* 1.42* 1.22*   BNP (last 3 results) No  results for input(s): PROBNP in the last 8760 hours. HbA1C: No results for input(s): HGBA1C in the last 72 hours. CBG: Recent Labs  Lab 08/30/18 2128 08/30/18 2354 08/31/18  4174 08/31/18 1035 08/31/18 1131  GLUCAP 120* 116* 116* 105* 109*   Lipid Profile: No results for input(s): CHOL, HDL, LDLCALC, TRIG, CHOLHDL, LDLDIRECT in the last 72 hours. Thyroid Function Tests: No results for input(s): TSH, T4TOTAL, FREET4, T3FREE, THYROIDAB in the last 72 hours. Anemia Panel: No results for input(s): VITAMINB12, FOLATE, FERRITIN, TIBC, IRON, RETICCTPCT in the last 72 hours. Urine analysis:    Component Value Date/Time   COLORURINE STRAW (A) 08/27/2018 2129   APPEARANCEUR CLEAR 08/27/2018 2129   LABSPEC 1.011 08/27/2018 2129   PHURINE 6.0 08/27/2018 2129   GLUCOSEU 50 (A) 08/27/2018 2129   HGBUR SMALL (A) 08/27/2018 2129   BILIRUBINUR NEGATIVE 08/27/2018 2129   KETONESUR 5 (A) 08/27/2018 2129   PROTEINUR >=300 (A) 08/27/2018 2129   NITRITE NEGATIVE 08/27/2018 2129   LEUKOCYTESUR NEGATIVE 08/27/2018 2129     Sheylin Scharnhorst M.D. [facility] 08/31/2018, 1:00 PM  Pager: [phone] Between 7am to 7pm - call Pager - [phone]  After 7pm go to [email or web address] - password [email or web address]  Call night coverage person covering after 7pm

## 2018-08-31 NOTE — Progress Notes (Signed)
Progress Note  Patient Name: Devin Harmon Date of Encounter: 08/31/2018  Primary Cardiologist: No primary care provider on file.  Subjective   Devin Harmon was more alert today.   Inpatient Medications    Scheduled Meds: . amLODipine  10 mg Oral Daily  . fluticasone  1 spray Each Nare Daily  . heparin injection (subcutaneous)  5,000 Units Subcutaneous Q8H  . labetalol  100 mg Oral BID  . multivitamin with minerals  1 tablet Oral Daily  . sodium polystyrene  30 g Oral Once  . terazosin  5 mg Oral QHS  . thiamine  100 mg Oral Daily   Continuous Infusions: .  sodium bicarbonate  infusion 1000 mL 50 mL/hr at 08/31/18 1824   PRN Meds: acetaminophen **OR** acetaminophen, hydrALAZINE, ipratropium-albuterol, ondansetron **OR** ondansetron (ZOFRAN) IV   Vital Signs    Vitals:   08/31/18 0428 08/31/18 0754 08/31/18 1337 08/31/18 1520  BP: 130/75 (!) 152/74 (!) 142/90 (!) 149/90  Pulse: 61 61 65 69  Resp: 20 20 20  (!) 22  Temp: 98.8 F (37.1 C)  98.6 F (37 C) 98.8 F (37.1 C)  TempSrc: Oral Oral Oral Oral  SpO2: 96% 93% 94% 96%  Weight: 90.6 kg     Height:        Intake/Output Summary (Last 24 hours) at 08/31/2018 1912 Last data filed at 08/31/2018 1700 Gross per 24 hour  Intake 1000 ml  Output 355 ml  Net 645 ml   Filed Weights   08/29/18 0500 08/30/18 0410 08/31/18 0428  Weight: 85 kg 86.1 kg 90.6 kg    Physical Exam   GEN: No acute distress, more alert.   Cardiac: RRR, no rub Respiratory: decreased breaht sounds GI: Soft, nontender, non-distended  MS: No edema; No deformity. Labs    Chemistry Recent Labs  Lab 08/28/18 0448 08/29/18 0302 08/30/18 0249 08/31/18 0342  NA 139 139 138 140  K 4.5 5.7* 4.8 5.1  CL 105 104 99 94*  CO2 18* 24 27 28   GLUCOSE 89 128* 111* 96  BUN 88* 87* 83* 81*  CREATININE 11.60* 11.69* 12.00* 12.71*  CALCIUM 8.7* 8.2* 7.7* 8.0*  PROT 6.8  --   --   --   ALBUMIN 3.9  --   --   --   AST 18  --   --   --   ALT 13   --   --   --   ALKPHOS 59  --   --   --   BILITOT 0.7  --   --   --   GFRNONAA 4* 4* 4* 4*  GFRAA 5* 5* 4* 4*  ANIONGAP 16* 11 12 18*     Hematology Recent Labs  Lab 08/29/18 0302 08/30/18 0249 08/31/18 0342  WBC 8.3 7.4 7.6  RBC 3.82* 3.57* 3.76*  HGB 10.8* 10.0* 10.7*  HCT 33.0* 31.1* 33.3*  MCV 86.4 87.1 88.6  MCH 28.3 28.0 28.5  MCHC 32.7 32.2 32.1  RDW 13.5 13.3 13.4  PLT 249 236 260    Cardiac Enzymes Recent Labs  Lab 08/28/18 0448 08/28/18 0919 08/28/18 1441  TROPONINI 1.52* 1.42* 1.22*    Recent Labs  Lab 08/28/18 0048  TROPIPOC 1.97*     BNPNo results for input(s): BNP, PROBNP in the last 168 hours.   DDimer No results for input(s): DDIMER in the last 168 hours.   Radiology    No results found.  Assessment & Plan    Principal  Problem:   Acute renal failure (HCC) Active Problems:   Non-ST elevated myocardial infarction [facility])   Hypertensive urgency   Acute renal failure with acute tubular necrosis superimposed on stage 3 chronic kidney disease ([place])   Palliative care encounter  Renal failure continues to be the primary issue and Devin Harmon' care.  Internal medicine and nephrology have begun to de-escalate antihypertensive therapy after discussions with the patient's wife.  At this time no further changes to his cardiovascular medications are necessary from the standpoint of cardiology consults.  We will plan to sign off, but would be happy to see the patient again as needed.  I have communicated this to both the primary service as well as nephrology consult.  The patient does not wish to receive dialysis, limiting cardiac interventions and aggressive treatment of blood pressure.  [facility] will sign off.    For questions or updates, please contact [place] Please consult [email or web address] for contact info under     Signed, Elouise Munroe, MD  08/31/2018, 7:12 PM

## 2018-08-31 NOTE — Telephone Encounter (Signed)
Devin Harmon from [facility]. Wife and Pt has declined Dialysis. Wife is in denial of his failing health. Wife wants to take him home and get him off of medication. She does not want anyone in the home to help. Said if she has him home for 5-7 days she could get him better. Devin Harmon thinks if Dr Silvio Pate could speak to the wife and pt, they may change their minds about getting help at home with Hospice. He is due to go home tomorrow without any assistance.  Devin Harmon is available at [phone]

## 2018-08-31 NOTE — Telephone Encounter (Signed)
Dr Silvio Pate reached out to the pt and wife.  I reached her in the room  Recommended hospice  She will consider  [place] about no dialysis-his clear prior wishes

## 2018-09-01 ENCOUNTER — Ambulatory Visit: Payer: 59 | Admitting: Internal Medicine

## 2018-09-01 ENCOUNTER — Telehealth: Payer: Self-pay | Admitting: Internal Medicine

## 2018-09-01 DIAGNOSIS — I10 Essential (primary) hypertension: Secondary | ICD-10-CM

## 2018-09-01 DIAGNOSIS — N179 Acute kidney failure, unspecified: Secondary | ICD-10-CM

## 2018-09-01 DIAGNOSIS — N19 Unspecified kidney failure: Secondary | ICD-10-CM

## 2018-09-01 DIAGNOSIS — G9341 Metabolic encephalopathy: Secondary | ICD-10-CM

## 2018-09-01 LAB — GLUCOSE, CAPILLARY
GLUCOSE-CAPILLARY: 100 mg/dL — AB (ref 70–99)
Glucose-Capillary: 101 mg/dL — ABNORMAL HIGH (ref 70–99)
Glucose-Capillary: 102 mg/dL — ABNORMAL HIGH (ref 70–99)

## 2018-09-01 LAB — METANEPHRINES, URINE, 24 HOUR
METANEPHRINES 24H UR: 227 ug/(24.h) (ref 45–290)
Metaneph Total, Ur: 142 ug/L
Normetanephrine, 24H Ur: 413 ug/24 hr (ref 82–500)
Normetanephrine, Ur: 258 ug/L

## 2018-09-01 LAB — BASIC METABOLIC PANEL
Anion gap: 11 (ref 5–15)
BUN: 83 mg/dL — ABNORMAL HIGH (ref 8–23)
CHLORIDE: 95 mmol/L — AB (ref 98–111)
CO2: 31 mmol/L (ref 22–32)
Calcium: 7.9 mg/dL — ABNORMAL LOW (ref 8.9–10.3)
Creatinine, Ser: 13.23 mg/dL — ABNORMAL HIGH (ref 0.61–1.24)
GFR calc Af Amer: 4 mL/min — ABNORMAL LOW (ref >60)
GFR calc non Af Amer: 3 mL/min — ABNORMAL LOW (ref >60)
GLUCOSE: 103 mg/dL — AB (ref 70–99)
Potassium: 5.1 mmol/L (ref 3.5–5.1)
Sodium: 137 mmol/L (ref 135–145)

## 2018-09-01 LAB — METANEPHRINES, PLASMA
METANEPHRINE FREE: 73 pg/mL — AB (ref 0–62)
NORMETANEPHRINE FREE: 135 pg/mL (ref 0–145)

## 2018-09-01 MED ORDER — SODIUM BICARBONATE POWD: 5.0000 mL | Freq: Three times a day (TID) | 0 refills | Status: DC

## 2018-09-01 MED ORDER — THIAMINE HCL 100 MG PO TABS: 100.0000 mg | ORAL_TABLET | Freq: Every day | ORAL | Status: DC

## 2018-09-01 MED ORDER — ADULT MULTIVITAMIN W/MINERALS CH: 1.0000 | ORAL_TABLET | Freq: Every day | ORAL | Status: DC

## 2018-09-01 MED ORDER — LABETALOL HCL 100 MG PO TABS: 100.0000 mg | ORAL_TABLET | Freq: Two times a day (BID) | ORAL | 0 refills | Status: DC

## 2018-09-01 MED ORDER — AMLODIPINE BESYLATE 10 MG PO TABS: 10.0000 mg | ORAL_TABLET | Freq: Every day | ORAL | 0 refills | Status: DC

## 2018-09-01 MED ORDER — SODIUM BICARBONATE 650 MG PO TABS
1300.0000 mg | ORAL_TABLET | Freq: Two times a day (BID) | ORAL | Status: DC
  Administered 2018-09-01: 1300 mg via ORAL
  Filled 2018-09-01: qty 2

## 2018-09-01 NOTE — Progress Notes (Signed)
[facility] Progress Note   Subjective:    Patient remains sleepy but denies complaints.   Spoke with wife again at length about recent events.  She says he was able to ambulate in the hall yesterday, ate more and is able to stand to urinate in [facility].    Palliative care assisting with [place] discussion.  Wife insisting to take him home with no services offered such as palliative care or hospice.   Dialysis not being considered given his prior longstanding wishes.  She wishes for him to continue on current medical therapies and see if he improves after going home.  Her wish is to return home today.    Objective Vitals:   09/01/18 0400 09/01/18 0414 09/01/18 0447 09/01/18 0951  BP: (!) 157/89   (!) 167/97  Pulse: 64 (!) 59  60  Resp:      Temp:  98.7 F (37.1 C)    TempSrc:  Oral    SpO2: 94% 96%    Weight:   84.9 kg   Height:       Physical Exam General: sleeping at 45 degrees, no distress Heart: RRR, no rub Lungs: normal WOB, no rales Abdomen: soft, nontender, +BS Extremities: no edema Neuro: sleepy, does not arouse to shoulder shake today  Additional Objective Labs: Basic Metabolic Panel: Recent Labs  Lab 08/28/18 0448  08/30/18 0249 08/31/18 0342 09/01/18 0341  NA 139   < > 138 140 137  K 4.5   < > 4.8 5.1 5.1  CL 105   < > 99 94* 95*  CO2 18*   < > 27 28 31   GLUCOSE 89   < > 111* 96 103*  BUN 88*   < > 83* 81* 83*  CREATININE 11.60*   < > 12.00* 12.71* 13.23*  CALCIUM 8.7*   < > 7.7* 8.0* 7.9*  PHOS 6.9*  --   --   --   --    < > = values in this interval not displayed.   Liver Function Tests: Recent Labs  Lab 08/28/18 0448  AST 18  ALT 13  ALKPHOS 59  BILITOT 0.7  PROT 6.8  ALBUMIN 3.9   No results for input(s): LIPASE, AMYLASE in the last 168 hours. CBC: Recent Labs  Lab 08/27/18 2132 08/28/18 0448 08/29/18 0302 08/30/18 0249 08/31/18 0342  WBC 9.1 8.4 8.3 7.4 7.6  NEUTROABS  --  6.4  --   --   --   HGB 12.1* 11.2* 10.8* 10.0*  10.7*  HCT 37.3* 34.7* 33.0* 31.1* 33.3*  MCV 88.2 87.0 86.4 87.1 88.6  PLT 283 255 249 236 260   Blood Culture    Component Value Date/Time   SDES BLOOD LEFT ANTECUBITAL 08/28/2018 1445   SPECREQUEST  08/28/2018 1445    BOTTLES DRAWN AEROBIC ONLY Blood Culture adequate volume   CULT  08/28/2018 1445    NO GROWTH 4 DAYS Performed at [facility], [place] [address] [place], [place] [address]    REPTSTATUS PENDING 08/28/2018 1445    Cardiac Enzymes: Recent Labs  Lab 08/28/18 0448 08/28/18 0919 08/28/18 1441  TROPONINI 1.52* 1.42* 1.22*   CBG: Recent Labs  Lab 08/31/18 1652 08/31/18 2048 09/01/18 0006 09/01/18 0432 09/01/18 0832  GLUCAP 99 114* 102* 101* 100*   Iron Studies: No results for input(s): IRON, TIBC, TRANSFERRIN, FERRITIN in the last 72 hours. @lablastinr3 @ Studies/Results: No results found. Medications:  . amLODipine  10 mg Oral Daily  .  fluticasone  1 spray Each Nare Daily  . heparin injection (subcutaneous)  5,000 Units Subcutaneous Q8H  . labetalol  100 mg Oral BID  . multivitamin with minerals  1 tablet Oral Daily  . sodium bicarbonate  1,300 mg Oral BID  . sodium polystyrene  30 g Oral Once  . terazosin  5 mg Oral QHS  . thiamine  100 mg Oral Daily     Assessment/Plan: 1.  AKI on CKD versus CKD:   No recent labs for comparison, but has had CKD for years with heavy proteinuria back as far as 2011 on review of chart.  I suspect there is little reversible component to this, though it's possible with better BP control he may see some improvement.    I believe he is uremic but in the past he has declined dialysis and his wife is supportive and understanding of this decision and it's consequences.    I encouraged her to consider supportive care offered by hospice or palliative care but she is still not planning to utilize this for now.  His wife wishes to have his labs and BP monitored in a clinic setting and mentions Dr. Silvio Pate his PCP as the  one who could do this.  I am not sure how much I can offer to Mr. Trueheart given the goals of care so I won't plan clinic follow up with myself, but I would be happy to help Dr. Silvio Pate with any nephrologic issues that arise.    2.  Anemia:  Hb 10, no indication for ESA.   3.  BMM: secondary hyperPTH, hyperphosphatemia.  Holding on further management in light of more pressing issues and goals of care.   4.  HTN:  Per wife baseline BP in the 160s, only recently > 200.  BP in the past 24 hours is 130-160s.   Labetalol was decreased from 200 BID to 100 BID today.  Still on terazosin 5, amlodipine 10, hydralazine 25 TID.  Discontinue hydralazine as well.   He had adrenal thickening on imaging and pheochromocytoma w/u is unrevealing.  Renal US/dopplers not suggestive of RAS.   5. Metabolic acidosis: stop IV bicarbonate today.  Rx na bicarb 1300 BID here but can take 1 tsp of baking soda TID at home if desired.  She was going to give him this anyway for dyspepsia.   Disposition  - ongoing goals of care discussion.  Appreciate palliative care involvement to help clarify.    Jannifer Hick MD 09/01/2018, 10:24 AM  [facility] Pager: [phone]

## 2018-09-01 NOTE — Discharge Summary (Addendum)
Physician Discharge Summary  Devin Harmon:[id number] DOB: 14-Feb-1956 DOA: 08/27/2018  PCP: Venia Carbon, MD  Admit date: 08/27/2018 Discharge date: 09/01/2018  Recommendations for Outpatient Follow-up:  1. Acute renal failure  Follow-up Information    Viviana Simpler I, MD Follow up in 3 day(s).   Specialties:  Internal Medicine, Pediatrics Why:  Recommend follow-up 3-5 days. Contact information: [place] [address] [phone]            Discharge Diagnoses:  1. Acute renal failure superimposed on CKD stage III with hyperkalemia on admission 2. Uremia with associated metabolic encephalopathy 3. Hypertensive emergency with associated demand ischemia 4. Essential HTN 5. Anemia of chronic kidney disease.  Discharge Condition: stable Disposition: home  Diet recommendation: heart healthy  Filed Weights   08/30/18 0410 08/31/18 0428 09/01/18 0447  Weight: 86.1 kg 90.6 kg 84.9 kg    History of present illness:  62 year old man PMH CKD stage III presented with chest congestion, reportedly difficult to control hypertension, found to be markedly hypertensive in ED with creatinine of 11.3, potassium of 6.4, elevated troponin.  Admitted for further evaluation by cardiology and nephrology.  Thought to have demand ischemia secondary to hypertensive emergency and cardiology recommended no further evaluation or treatment.   Hospital Course:  Antihypertensives were initiated but rolled back given wife's believe that they were the cause of the patient's encephalopathy.  Blood pressure has remained adequately controlled. Patient seen by nephrology, recommendation for initiation of hemodialysis which the patient's wife declined given the patient's previous expressed wishes of no hemodialysis.  Renal function worsened daily the patient was able to ambulate with physical therapy, was awake and able to follow some commands.  I had extensive discussion with  the patient, wife, daughter, son-in-law at bedside as well as the patient's nephrologist in the hospital and the patient's primary care physician Dr. Silvio Pate.  I reviewed all laboratory studies with the patient's wife and my concern that his renal function will continue to worsen and result in his death.  I urged her to accept home hospice but she refused.  She also refuses palliative care at home.  She understands that his renal function is worsening and he may die from this.  Her wishes appear to be consistent with his desire to never have dialysis.  She appears to have his best wishes in mind.  She hopes that with control of his blood pressure things may improve over time.  In discussion with the patient's primary care physician, he has also discussed his concerns with the patient's wife and has recommended hospice but she has reviewed.  She demonstrates a clear understanding of the patient's condition, prognosis and treatment options but requests discharge home today and no outpatient assistance. Dr. Silvio Pate makes home visits and will assist her in any way he can.  Acute renal failure superimposed on CKD stage III with hyperkalemia on admission; ongoing uremia with associated acute metabolic encephalopathy. Secondary to uncontrolled hypertension secondary to noncompliance with medications.  CT renal study no evidence of obstruction. Toxicology screen unremarkable.  Pheochromocytoma work-up unrevealing.  Lisinopril held.  Renal Doppler negative for arterial stenosis.  Nephrology recommended initiation of hemodialysis. --Nephrology suspect uremia is because of metabolic encephalopathy, however wife is not embrace this and instead believes it is related to multiple medications.  Hypertensive emergency with associated demand ischemia.  No evidence of ACS per cardiology.  Heparin discontinued.  Not a candidate for CV procedures in the setting of renal  failure.  Echocardiogram showed preserved LVEF. --Continue  Norvasc, labetalol, terazosin  Anemia of chronic kidney disease. --stable  Urinary retention, wife refused reinsertion of Foley catheter.     Wife wishes to have labs and BP monitored in the clinic setting and follow-up with PCP.  Nephrology is recommended sodium bicarbonate 1300 mg twice daily or 1 tsp of baking soda TID at home (wife prefers baking soda)   Today's assessment: S: feels ok, denies pain O: Vitals:  Vitals:   09/01/18 0951 09/01/18 1216  BP: (!) 167/97 (!) 153/94  Pulse: 60 63  Resp:    Temp:  97.6 F (36.4 C)  SpO2:      Constitutional:  . Appears calm and comfortable Eyes:  . pupils and irises appear normal . Normal lids ENMT:  . grossly normal hearing  Respiratory:  . CTA bilaterally, no w/r/r.  . Respiratory effort normal.  Cardiovascular:  . RRR, no m/r/g Telemetry SR . No LE extremity edema   Abdomen:  . Soft, ntnd Psychiatric:  . Mental status o Sleepy but arouses to voice and follows simple commands, oriented to self, wife  Potassium stable.  Creatinine worsening, 13.23.  Discharge Instructions  Discharge Instructions    Diet - low sodium heart healthy   Complete by:  As directed    Discharge instructions   Complete by:  As directed    Call your physician or seek immediate medical attention for worsening of condition, poor urine output, confusion, excessive sleepiness.   Increase activity slowly   Complete by:  As directed      Allergies as of 09/01/2018      Reactions   Chlorthalidone    REACTION: felt "bad" and ED   Hydrocodone Other (See Comments)   Nausea and dizzy   Sulfonamide Derivatives    REACTION: unspecified      Medication List    STOP taking these medications   lisinopril 40 MG tablet Commonly known as:  PRINIVIL,ZESTRIL     TAKE these medications   acetaminophen 500 MG tablet Commonly known as:  TYLENOL Take 500-1,000 mg by mouth every 6 (six) hours as needed for mild pain.   amLODipine 10 MG  tablet Commonly known as:  NORVASC Take 1 tablet (10 mg total) by mouth daily. Start taking on:  09/02/2018   labetalol 100 MG tablet Commonly known as:  NORMODYNE Take 1 tablet (100 mg total) by mouth 2 (two) times daily.   multivitamin with minerals Tabs tablet Take 1 tablet by mouth daily. Start taking on:  09/02/2018   Sodium Bicarbonate Powd Take 5 mLs by mouth 3 (three) times daily.   terazosin 5 MG capsule Commonly known as:  HYTRIN Take 1 capsule (5 mg total) by mouth at bedtime.   thiamine 100 MG tablet Take 1 tablet (100 mg total) by mouth daily. Start taking on:  09/02/2018      Allergies  Allergen Reactions  . Chlorthalidone     REACTION: felt "bad" and ED  . Hydrocodone Other (See Comments)    Nausea and dizzy  . Sulfonamide Derivatives     REACTION: unspecified    The results of significant diagnostics from this hospitalization (including imaging, microbiology, ancillary and laboratory) are listed below for reference.    Significant Diagnostic Studies: Dg Chest 2 View  Result Date: 08/27/2018 CLINICAL DATA:  62 year old male with cough EXAM: CHEST - 2 VIEW COMPARISON:  None. FINDINGS: The lungs are clear. There is no pleural effusion or pneumothorax. Borderline  cardiomegaly. No acute osseous pathology. IMPRESSION: No active cardiopulmonary disease. Electronically Signed   By: Anner Crete M.D.   On: 08/27/2018 23:44   Ct Renal Stone Study  Result Date: 08/28/2018 CLINICAL DATA:  62 year old male with abdominal pain. EXAM: CT ABDOMEN AND PELVIS WITHOUT CONTRAST TECHNIQUE: Multidetector CT imaging of the abdomen and pelvis was performed following the standard protocol without IV contrast. COMPARISON:  None. FINDINGS: Evaluation of this exam is limited in the absence of intravenous contrast. Evaluation is also limited due to respiratory motion artifact. Lower chest: The visualized lung bases are clear. There is cardiomegaly and coronary vascular calcification.  No intra-abdominal free air or free fluid. Hepatobiliary: No focal liver abnormality is seen. No gallstones, gallbladder wall thickening, or biliary dilatation. Pancreas: Unremarkable. No pancreatic ductal dilatation or surrounding inflammatory changes. Spleen: Normal in size without focal abnormality. Adrenals/Urinary Tract: Mild bilateral adrenal thickening. There is a subcentimeter exophytic lesion from the anterolateral aspect of the right kidney which is too small to characterize. There is no hydronephrosis or nephrolithiasis on either side. The visualized ureters and urinary bladder appear unremarkable. Stomach/Bowel: There is no bowel obstruction or active inflammation. Normal appendix. Vascular/Lymphatic: Mild aortoiliac atherosclerotic disease. No portal venous gas. There is no adenopathy. Reproductive: Prostate and seminal vesicles are grossly unremarkable. No pelvic mass. Other: Small fat containing umbilical hernia. Musculoskeletal: Degenerative changes of the lower lumbar spine. No acute osseous pathology. IMPRESSION: No acute intra-abdominopelvic pathology. No hydronephrosis or nephrolithiasis. Electronically Signed   By: Anner Crete M.D.   On: 08/28/2018 01:19    Microbiology: Recent Results (from the past 240 hour(s))  Culture, blood (routine x 2)     Status: None (Preliminary result)   Collection Time: 08/28/18  2:30 PM  Result Value Ref Range Status   Specimen Description BLOOD RIGHT HAND  Final   Special Requests   Final    BOTTLES DRAWN AEROBIC ONLY Blood Culture adequate volume   Culture   Final    NO GROWTH 4 DAYS Performed at [facility], [address] [place], [place] [address]    Report Status PENDING  Incomplete  Culture, blood (routine x 2)     Status: None (Preliminary result)   Collection Time: 08/28/18  2:45 PM  Result Value Ref Range Status   Specimen Description BLOOD LEFT ANTECUBITAL  Final   Special Requests   Final    BOTTLES DRAWN AEROBIC ONLY  Blood Culture adequate volume   Culture   Final    NO GROWTH 4 DAYS Performed at [facility], [place] [address], [place], [place] [address]    Report Status PENDING  Incomplete     Labs: Basic Metabolic Panel: Recent Labs  Lab 08/28/18 0448 08/29/18 0302 08/30/18 0249 08/31/18 0342 09/01/18 0341  NA 139 139 138 140 137  K 4.5 5.7* 4.8 5.1 5.1  CL 105 104 99 94* 95*  CO2 18* 24 27 28 31   GLUCOSE 89 128* 111* 96 103*  BUN 88* 87* 83* 81* 83*  CREATININE 11.60* 11.69* 12.00* 12.71* 13.23*  CALCIUM 8.7* 8.2* 7.7* 8.0* 7.9*  MG 2.0  --   --   --   --   PHOS 6.9*  --   --   --   --    Liver Function Tests: Recent Labs  Lab 08/28/18 0448  AST 18  ALT 13  ALKPHOS 59  BILITOT 0.7  PROT 6.8  ALBUMIN 3.9    Recent Labs  Lab  08/30/18 1503  AMMONIA 23   CBC: Recent Labs  Lab 08/27/18 2132 08/28/18 0448 08/29/18 0302 08/30/18 0249 08/31/18 0342  WBC 9.1 8.4 8.3 7.4 7.6  NEUTROABS  --  6.4  --   --   --   HGB 12.1* 11.2* 10.8* 10.0* 10.7*  HCT 37.3* 34.7* 33.0* 31.1* 33.3*  MCV 88.2 87.0 86.4 87.1 88.6  PLT 283 255 249 236 260   Cardiac Enzymes: Recent Labs  Lab 08/28/18 0448 08/28/18 0919 08/28/18 1441  TROPONINI 1.52* 1.42* 1.22*     CBG: Recent Labs  Lab 08/31/18 1652 08/31/18 2048 09/01/18 0006 09/01/18 0432 09/01/18 0832  GLUCAP 99 114* 102* 101* 100*    Principal Problem:   Acute renal failure (HCC) Active Problems:   Non-ST elevated myocardial infarction (HCC)   Hypertensive urgency   Acute renal failure with acute tubular necrosis superimposed on stage 3 chronic kidney disease ([place])   Palliative care encounter   Time coordinating discharge: 90 minutes direct face-to-face with wife, patient, sister, brother-in-law counseling, review of all lab work, discussion with Dr. Johnney Ou  Signed:  Murray Hodgkins, MD [facility] 09/01/2018, 12:25 PM

## 2018-09-01 NOTE — Telephone Encounter (Signed)
PC from Dr Ivor Reining Reviewed status and clinical course. I let him know that wife was determined to take him home without any help. Suggested it may be worth brief trial of dialysis for him to be lucid and decide again about his decision about no dialysis (long standing but before he was faced with life or death status) Also suggested hospice--she will consider  Dr Sarajane Jews has had similar conversations Will have nurse here do phone follow up to see if we can offer any other services. Expect him to die fairly quickly at this point.

## 2018-09-01 NOTE — Progress Notes (Signed)
Pt and wife educated and provided discharge instructions. IV's removed and intact. Vitals stable. Pt has no complaints. All pt belongings with wife. Volunteers called to assist pt via wheelchair to valet to meet wife. Jerald Kief

## 2018-09-01 NOTE — Evaluation (Signed)
Physical Therapy Evaluation Patient Details Name: Devin Harmon MRN: [id number] DOB: 02/08/1956 Today's Date: 09/01/2018   History of Present Illness  Pt adm with hypertensive emergency with acute on chronic kidney failure. Pt and family chose no HD per wishes of pt. Pt with encephalopathy due to uremia. PMH - ckd, htn  Clinical Impression  Pt did well with mobility. At this time needs supervision due to cognitive deficits. Wife present and reports she will provide supervision at home.     Follow Up Recommendations No PT follow up    Equipment Recommendations  None recommended by PT    Recommendations for Other Services       Precautions / Restrictions Precautions Precautions: Fall Precaution Comments: due to encehphalopathy      Mobility  Bed Mobility Overal bed mobility: Modified Independent                Transfers Overall transfer level: Needs assistance Equipment used: None Transfers: Sit to/from Stand Sit to Stand: Supervision         General transfer comment: supervision for safety due to decr cognition  Ambulation/Gait Ambulation/Gait assistance: Supervision Gait Distance (Feet): 450 Feet Assistive device: None Gait Pattern/deviations: Step-through pattern;Drifts right/left   Gait velocity interpretation: >2.62 ft/sec, indicative of community ambulatory General Gait Details: Pt with slight drift but no instability. Able to step over box and to pick up object from floor.   Stairs            Wheelchair Mobility    Modified Rankin (Stroke Patients Only)       Balance Overall balance assessment: Modified Independent                                           Pertinent Vitals/Pain Pain Assessment: No/denies pain    Home Living Family/patient expects to be discharged to:: Private residence Living Arrangements: Spouse/significant other Available Help at Discharge: Family Type of Home: House Home Access: Stairs to  enter Entrance Stairs-Rails: Psychiatric nurse of Steps: 5 Home Layout: One level Home Equipment: None      Prior Function Level of Independence: Independent         Comments: Takes care of farm     Hand Dominance        Extremity/Trunk Assessment   Upper Extremity Assessment Upper Extremity Assessment: Defer to OT evaluation    Lower Extremity Assessment Lower Extremity Assessment: Overall WFL for tasks assessed       Communication   Communication: No difficulties  Cognition Arousal/Alertness: Awake/alert Behavior During Therapy: Flat affect Overall Cognitive Status: Impaired/Different from baseline Area of Impairment: Problem solving;Safety/judgement;Following commands                       Following Commands: Follows multi-step commands with increased time Safety/Judgement: Decreased awareness of safety;Decreased awareness of deficits   Problem Solving: Slow processing;Requires verbal cues        General Comments      Exercises     Assessment/Plan    PT Assessment Patent does not need any further PT services  PT Problem List         PT Treatment Interventions      PT Goals (Current goals can be found in the Care Plan section)  Acute Rehab PT Goals PT Goal Formulation: All assessment and education complete, DC therapy  Frequency     Barriers to discharge        Co-evaluation               AM-PAC PT "6 Clicks" Daily Activity  Outcome Measure Difficulty turning over in bed (including adjusting bedclothes, sheets and blankets)?: None Difficulty moving from lying on back to sitting on the side of the bed? : None Difficulty sitting down on and standing up from a chair with arms (e.g., wheelchair, bedside commode, etc,.)?: None Help needed moving to and from a bed to chair (including a wheelchair)?: None Help needed walking in hospital room?: None Help needed climbing 3-5 steps with a railing? : A Little 6  Click Score: 23    End of Session Equipment Utilized During Treatment: Gait belt Activity Tolerance: Patient tolerated treatment well Patient left: in bed;with call bell/phone within reach;with family/visitor present;with nursing/sitter in room Nurse Communication: Mobility status PT Visit Diagnosis: Other abnormalities of gait and mobility (R26.89)    Time: 0946-1000 PT Time Calculation (min) (ACUTE ONLY): 14 min   Charges:   PT Evaluation $PT Eval Low Complexity: [place] Pager [phone] Office [place] 09/01/2018, 10:17 AM

## 2018-09-01 NOTE — Progress Notes (Addendum)
OT Cancellation Note  Patient Details Name: Devin Harmon MRN: [id number] DOB: [birth year]   Cancelled Treatment:    Reason Eval/Treat Not Completed: OT screened, no needs identified, will sign off. Per chart review and discussion with pt and spouse, pt is at baseline with ADLs. Pt is for d/c home today. OT signing off.   Tyrone Schimke, OT Acute Rehabilitation Services Pager: [phone] Office: [phone]   09/01/2018, 12:39 PM

## 2018-09-02 ENCOUNTER — Encounter: Payer: Self-pay | Admitting: Internal Medicine

## 2018-09-02 ENCOUNTER — Ambulatory Visit: Payer: 59 | Admitting: Internal Medicine

## 2018-09-02 VITALS — BP 138/78 | HR 55 | Temp 98.3°F | Ht 68.0 in | Wt 189.0 lb

## 2018-09-02 DIAGNOSIS — I10 Essential (primary) hypertension: Secondary | ICD-10-CM | POA: Diagnosis not present

## 2018-09-02 DIAGNOSIS — N170 Acute kidney failure with tubular necrosis: Secondary | ICD-10-CM | POA: Diagnosis not present

## 2018-09-02 DIAGNOSIS — N183 Chronic kidney disease, stage 3 (moderate): Secondary | ICD-10-CM | POA: Diagnosis not present

## 2018-09-02 LAB — UPEP/UIFE/LIGHT CHAINS/TP, 24-HR UR
% BETA, URINE: 16.2 %
ALPHA 1 URINE: 7.3 %
ALPHA 2 UR: 9.4 %
Albumin, U: 55.1 %
FREE KAPPA LT CHAINS, UR: 258 mg/L — AB (ref 1.35–24.19)
FREE KAPPA/LAMBDA RATIO: 6 (ref 2.04–10.37)
FREE LAMBDA LT CHAINS, UR: 43 mg/L — AB (ref 0.24–6.66)
GAMMA GLOBULIN URINE: 11.9 %
TOTAL PROTEIN, URINE-UPE24: 196.2 mg/dL
Total Protein, Urine-Ur/day: 3139 mg/24 hr — ABNORMAL HIGH (ref 30–150)

## 2018-09-02 LAB — CULTURE, BLOOD (ROUTINE X 2)
Culture: NO GROWTH
Culture: NO GROWTH
SPECIAL REQUESTS: ADEQUATE
Special Requests: ADEQUATE

## 2018-09-02 MED ORDER — SODIUM BICARBONATE 650 MG PO TABS: 650.0000 mg | ORAL_TABLET | Freq: Three times a day (TID) | ORAL | 1 refills | Status: DC

## 2018-09-02 NOTE — Telephone Encounter (Signed)
Patient's wife called in this am with multiple questions regarding his medications and need for dialysis.  After speaking with Earl Lagos, she made an appt with Dr. Silvio Pate for this afternoon.  Then asked that I call her back to discuss further.  When returning her call this am, patient's wife states that [place] answered her questions until she can speak with Dr. Silvio Pate this afternoon.

## 2018-09-02 NOTE — Progress Notes (Signed)
Subjective:    Patient ID: Devin Harmon, male    DOB: 11/13/1956, 62 y.o.   MRN: [id number]  HPI Here with wife for hospital follow up Reviewed hospital records and had spoken to his wife on a couple of occasions  She decided not to do dialysis based on his former stated desires Decided against even having hospice at home He is sleepy now Is able to stay awake for visitors Is able to eat some Took a shower by himself Wife has been giving his medications Went up at night to the bathroom by himself  No edema No SOB No chest pain  Current Outpatient Medications on File Prior to Visit  Medication Sig Dispense Refill  . acetaminophen (TYLENOL) 500 MG tablet Take 500-1,000 mg by mouth every 6 (six) hours as needed for mild pain.    Marland Kitchen amLODipine (NORVASC) 10 MG tablet Take 1 tablet (10 mg total) by mouth daily. 30 tablet 0  . labetalol (NORMODYNE) 100 MG tablet Take 1 tablet (100 mg total) by mouth 2 (two) times daily. 60 tablet 0  . Multiple Vitamin (MULTIVITAMIN WITH MINERALS) TABS tablet Take 1 tablet by mouth daily.    . Sodium Bicarbonate POWD Take 5 mLs by mouth 3 (three) times daily. Also known as baking soda powder  0  . terazosin (HYTRIN) 5 MG capsule Take 1 capsule (5 mg total) by mouth at bedtime. 90 capsule 3  . thiamine 100 MG tablet Take 1 tablet (100 mg total) by mouth daily.     No current facility-administered medications on file prior to visit.     Allergies  Allergen Reactions  . Chlorthalidone     REACTION: felt "bad" and ED  . Hydrocodone Other (See Comments)    Nausea and dizzy  . Sulfonamide Derivatives     REACTION: unspecified    Past Medical History:  Diagnosis Date  . Allergy   . BPH (benign prostatic hypertrophy)   . Hypertension   . Renal insufficiency   . Zoster 2007   facial, hospital 2007    History reviewed. No pertinent surgical history.  Family History  Problem Relation Age of Onset  . Hypertension Mother   . Cancer Mother        lung cancer  . Hypertension Brother   . Heart disease Father   . Coronary artery disease Neg Hx   . Diabetes Neg Hx     Social History   Socioeconomic History  . Marital status: Married    Spouse name: Not on file  . Number of children: 2  . Years of education: Not on file  . Highest education level: Not on file  Occupational History  . Occupation: Museum/gallery curator, building new homes  Social Needs  . Financial resource strain: Not on file  . Food insecurity:    Worry: Not on file    Inability: Not on file  . Transportation needs:    Medical: Not on file    Non-medical: Not on file  Tobacco Use  . Smoking status: Never Smoker  . Smokeless tobacco: Never Used  Substance and Sexual Activity  . Alcohol use: Yes    Comment: occasional  . Drug use: Not Currently  . Sexual activity: Not on file  Lifestyle  . Physical activity:    Days per week: Not on file    Minutes per session: Not on file  . Stress: Not on file  Relationships  . Social connections:    Talks on  phone: Not on file    Gets together: Not on file    Attends religious service: Not on file    Active member of club or organization: Not on file    Attends meetings of clubs or organizations: Not on file    Relationship status: Not on file  . Intimate partner violence:    Fear of current or ex partner: Not on file    Emotionally abused: Not on file    Physically abused: Not on file    Forced sexual activity: Not on file  Other Topics Concern  . Not on file  Social History Narrative  . Not on file   Review of Systems Weight is down 15# from my past visit No nausea or vomiting Did have incontinence once---may have forgotten that he didn't have the condom on (per wife) Some constipation now    Objective:   Physical Exam  Constitutional: He appears well-developed. No distress.  Neck: No thyromegaly present.  Cardiovascular: Normal rate, regular rhythm and normal heart sounds. Exam reveals no gallop.  No  murmur heard. Respiratory: Effort normal and breath sounds normal. No respiratory distress. He has no wheezes. He has no rales.  Musculoskeletal: He exhibits no edema.  Lymphadenopathy:    He has no cervical adenopathy.  Neurological:  Awake---but passive and looks distant He does answer questions though (he doesn't initiate any conversation)           Assessment & Plan:

## 2018-09-02 NOTE — Assessment & Plan Note (Signed)
BP Readings from Last 3 Encounters:  09/02/18 138/78  09/01/18 (!) 153/94  08/27/18 (!) 235/125   Better now Wife is concerned about the medications causing the sedation he has Discussed that I think this is the renal failure Will consider stopping the terazosin next time if any orthostatic symptoms (since that is the least likely to have vascular protection)

## 2018-09-02 NOTE — Assessment & Plan Note (Signed)
Still sedated but not fluid overloaded On bicarb for the acidosis Discussed with him---and he seems to understand. He still does not want to have dialysis Discussed with wife what the expectation of symptoms/course if the kidney failure continues to progress (I am shocked that he can walk and talk with me with GFR of 3-4)

## 2018-09-16 ENCOUNTER — Ambulatory Visit: Payer: 59 | Admitting: Internal Medicine

## 2018-09-16 ENCOUNTER — Encounter: Payer: Self-pay | Admitting: Internal Medicine

## 2018-09-16 VITALS — BP 146/84 | HR 54 | Temp 98.0°F | Ht 68.0 in | Wt 176.0 lb

## 2018-09-16 DIAGNOSIS — I10 Essential (primary) hypertension: Secondary | ICD-10-CM | POA: Diagnosis not present

## 2018-09-16 DIAGNOSIS — N185 Chronic kidney disease, stage 5: Secondary | ICD-10-CM | POA: Diagnosis not present

## 2018-09-16 NOTE — Assessment & Plan Note (Signed)
He still clearly tells me "no" about dialysis Seems to be uremic in mental state Will recheck labs No CHF now though

## 2018-09-16 NOTE — Progress Notes (Signed)
Subjective:    Patient ID: Devin Harmon, male    DOB: 1956-02-06, 62 y.o.   MRN: [id number]  HPI Here with wife for follow up of renal failure  Still not much better Doesn't talk much---just yes or no Appetite is fair. Had just lost 2 top incisors---this affects his chewing Doing okay with soft diet  Stays up more since last visit Still sleeping a lot though  No SOB No edema No chest pain  Current Outpatient Medications on File Prior to Visit  Medication Sig Dispense Refill  . amLODipine (NORVASC) 10 MG tablet Take 1 tablet (10 mg total) by mouth daily. 30 tablet 0  . labetalol (NORMODYNE) 100 MG tablet Take 1 tablet (100 mg total) by mouth 2 (two) times daily. 60 tablet 0  . Multiple Vitamin (MULTIVITAMIN WITH MINERALS) TABS tablet Take 1 tablet by mouth daily.    . sodium bicarbonate 650 MG tablet Take 1 tablet (650 mg total) by mouth 3 (three) times daily. 90 tablet 1  . terazosin (HYTRIN) 5 MG capsule Take 1 capsule (5 mg total) by mouth at bedtime. 90 capsule 3  . thiamine 100 MG tablet Take 1 tablet (100 mg total) by mouth daily.    Marland Kitchen acetaminophen (TYLENOL) 500 MG tablet Take 500-1,000 mg by mouth every 6 (six) hours as needed for mild pain.     No current facility-administered medications on file prior to visit.     Allergies  Allergen Reactions  . Chlorthalidone     REACTION: felt "bad" and ED  . Hydrocodone Other (See Comments)    Nausea and dizzy  . Sulfonamide Derivatives     REACTION: unspecified    Past Medical History:  Diagnosis Date  . Allergy   . BPH (benign prostatic hypertrophy)   . Hypertension   . Renal insufficiency   . Zoster 2007   facial, hospital 2007    History reviewed. No pertinent surgical history.  Family History  Problem Relation Age of Onset  . Hypertension Mother   . Cancer Mother        lung cancer  . Hypertension Brother   . Heart disease Father   . Coronary artery disease Neg Hx   . Diabetes Neg Hx     Social  History   Socioeconomic History  . Marital status: Married    Spouse name: Not on file  . Number of children: 2  . Years of education: Not on file  . Highest education level: Not on file  Occupational History  . Occupation: Museum/gallery curator, building new homes  Social Needs  . Financial resource strain: Not on file  . Food insecurity:    Worry: Not on file    Inability: Not on file  . Transportation needs:    Medical: Not on file    Non-medical: Not on file  Tobacco Use  . Smoking status: Never Smoker  . Smokeless tobacco: Never Used  Substance and Sexual Activity  . Alcohol use: Yes    Comment: occasional  . Drug use: Not Currently  . Sexual activity: Not on file  Lifestyle  . Physical activity:    Days per week: Not on file    Minutes per session: Not on file  . Stress: Not on file  Relationships  . Social connections:    Talks on phone: Not on file    Gets together: Not on file    Attends religious service: Not on file    Active member of  club or organization: Not on file    Attends meetings of clubs or organizations: Not on file    Relationship status: Not on file  . Intimate partner violence:    Fear of current or ex partner: Not on file    Emotionally abused: Not on file    Physically abused: Not on file    Forced sexual activity: Not on file  Other Topics Concern  . Not on file  Social History Narrative  . Not on file   Review of Systems No stroke like symptoms---like facial droop, dysphagia, focal weakness Weight down 13# in past 2 weeks Wife is trying to keep to a renal diet    Objective:   Physical Exam  Constitutional: He appears well-developed. No distress.  Neck: No thyromegaly present.  Cardiovascular: Normal rate, regular rhythm and normal heart sounds. Exam reveals no gallop.  No murmur heard. Respiratory: Effort normal and breath sounds normal. No respiratory distress. He has no wheezes. He has no rales.  Lymphadenopathy:    He has no cervical  adenopathy.  Neurological:  Psychomotor retardation Mild symmetric weakness No CN abnormalities Normal tone           Assessment & Plan:

## 2018-09-16 NOTE — Assessment & Plan Note (Signed)
BP Readings from Last 3 Encounters:  09/16/18 (!) 146/84  09/02/18 138/78  09/01/18 (!) 153/94   Up a little Discussed with wife---I don't think we should stop the terazosin at this point

## 2018-09-17 ENCOUNTER — Telehealth: Payer: Self-pay | Admitting: Radiology

## 2018-09-17 ENCOUNTER — Telehealth: Payer: Self-pay | Admitting: Internal Medicine

## 2018-09-17 LAB — RENAL FUNCTION PANEL
ALBUMIN: 4.4 g/dL (ref 3.5–5.2)
BUN: 123 mg/dL (ref 6–23)
CHLORIDE: 100 meq/L (ref 96–112)
CO2: 21 mEq/L (ref 19–32)
Calcium: 9.1 mg/dL (ref 8.4–10.5)
Creatinine, Ser: 12.55 mg/dL (ref 0.40–1.50)
GFR: 5.23 mL/min — CL (ref >60.00)
Glucose, Bld: 101 mg/dL — ABNORMAL HIGH (ref 70–99)
POTASSIUM: 5.5 meq/L — AB (ref 3.5–5.1)
Phosphorus: 7.2 mg/dL — ABNORMAL HIGH (ref 2.3–4.6)
SODIUM: 134 meq/L — AB (ref 135–145)

## 2018-09-17 LAB — CBC
HEMATOCRIT: 35.3 % — AB (ref 39.0–52.0)
HEMOGLOBIN: 12 g/dL — AB (ref 13.0–17.0)
MCHC: 34.2 g/dL (ref 30.0–36.0)
MCV: 85.8 fl (ref 78.0–100.0)
Platelets: 271 10*3/uL (ref 150.0–400.0)
RBC: 4.11 Mil/uL — ABNORMAL LOW (ref 4.22–5.81)
RDW: 13.6 % (ref 11.5–15.5)
WBC: 6.1 10*3/uL (ref 4.0–10.5)

## 2018-09-17 NOTE — Telephone Encounter (Signed)
Spoke to pt's wife. She is asking if anything can be done to lower the BUN number since it is really elevated.

## 2018-09-17 NOTE — Telephone Encounter (Signed)
Copied from [place] [id number]. Topic: Quick Communication - See Telephone Encounter >> Sep 17, 2018  3:50 PM Vernona Rieger wrote: CRM for notification. See Telephone encounter for: 09/17/18.  Patient's wife called and said she just received a call for labs from the nurse. She said she has some additional questions

## 2018-09-17 NOTE — Telephone Encounter (Signed)
[facility] called critical results, BUN - 123, CRT - 12.55, GFR - 5.23. Results given to Dr Silvio Pate

## 2018-09-17 NOTE — Telephone Encounter (Signed)
This is not new or surprising Will have [facility] call with the (expected) results

## 2018-09-18 NOTE — Telephone Encounter (Signed)
That is one of the measures of his kidney function. There is not much to do to get that down without dialysis. It might help some to restrict protein and have more fat and carbs

## 2018-09-20 NOTE — Telephone Encounter (Signed)
Left message on VM per DPR for Mrs. Gonzalo.

## 2018-09-24 ENCOUNTER — Other Ambulatory Visit: Payer: Self-pay | Admitting: Internal Medicine

## 2018-10-07 ENCOUNTER — Other Ambulatory Visit: Payer: Self-pay | Admitting: Internal Medicine

## 2018-10-08 MED ORDER — SODIUM BICARBONATE 650 MG PO TABS: 650.0000 mg | ORAL_TABLET | Freq: Three times a day (TID) | ORAL | 3 refills | Status: DC

## 2018-10-08 NOTE — Addendum Note (Signed)
Addended by: Pilar Grammes on: 10/08/2018 08:02 AM   Modules accepted: Orders

## 2018-10-13 ENCOUNTER — Telehealth: Payer: Self-pay | Admitting: Internal Medicine

## 2018-10-13 MED ORDER — LABETALOL HCL 100 MG PO TABS: 100.0000 mg | ORAL_TABLET | Freq: Two times a day (BID) | ORAL | 11 refills | Status: DC

## 2018-10-13 MED ORDER — AMLODIPINE BESYLATE 10 MG PO TABS: 10.0000 mg | ORAL_TABLET | Freq: Every day | ORAL | 11 refills | Status: DC

## 2018-10-13 NOTE — Telephone Encounter (Signed)
Pt's wife called office stating that the pt was prescribed Amlodipine and Labetalol at the hospital. The pt does not have any more refills. She stated she spoke with [facility] on [address], [place] and they would send the refill request over.

## 2018-10-13 NOTE — Telephone Encounter (Signed)
Left a message for wife that the rxs were sent in.

## 2018-10-22 ENCOUNTER — Telehealth: Payer: Self-pay | Admitting: Internal Medicine

## 2018-10-22 ENCOUNTER — Encounter: Payer: Self-pay | Admitting: Internal Medicine

## 2018-10-22 ENCOUNTER — Ambulatory Visit: Payer: 59 | Admitting: Internal Medicine

## 2018-10-22 VITALS — BP 176/92 | HR 64 | Temp 98.6°F | Resp 16 | Ht 68.0 in | Wt 176.0 lb

## 2018-10-22 DIAGNOSIS — I10 Essential (primary) hypertension: Secondary | ICD-10-CM | POA: Diagnosis not present

## 2018-10-22 DIAGNOSIS — N185 Chronic kidney disease, stage 5: Secondary | ICD-10-CM

## 2018-10-22 LAB — CBC
HCT: 31.4 % — ABNORMAL LOW (ref 39.0–52.0)
Hemoglobin: 10.8 g/dL — ABNORMAL LOW (ref 13.0–17.0)
MCHC: 34.3 g/dL (ref 30.0–36.0)
MCV: 83.7 fl (ref 78.0–100.0)
PLATELETS: 290 10*3/uL (ref 150.0–400.0)
RBC: 3.75 Mil/uL — AB (ref 4.22–5.81)
RDW: 14.5 % (ref 11.5–15.5)
WBC: 8 10*3/uL (ref 4.0–10.5)

## 2018-10-22 LAB — RENAL FUNCTION PANEL
Albumin: 4.3 g/dL (ref 3.5–5.2)
BUN: 70 mg/dL — ABNORMAL HIGH (ref 6–23)
CO2: 20 mEq/L (ref 19–32)
CREATININE: 8.03 mg/dL — AB (ref 0.40–1.50)
Calcium: 8.9 mg/dL (ref 8.4–10.5)
Chloride: 105 mEq/L (ref 96–112)
GFR: 8.76 mL/min — AB (ref >60.00)
GLUCOSE: 101 mg/dL — AB (ref 70–99)
Phosphorus: 4.6 mg/dL (ref 2.3–4.6)
Potassium: 4.7 mEq/L (ref 3.5–5.1)
Sodium: 135 mEq/L (ref 135–145)

## 2018-10-22 MED ORDER — TERAZOSIN HCL 10 MG PO CAPS: 10.0000 mg | ORAL_CAPSULE | Freq: Every day | ORAL | 11 refills | Status: DC

## 2018-10-22 NOTE — Patient Instructions (Addendum)
Please try over the counter loratadine 10mg  or cetirizine 10mg  at bedtime for the drip and cough. Please increase the terazosin to 10mg  each night. Try 2 of the 5mg  capsules at the same time---then fill the prescription for the 10mg  size if all goes well with that.

## 2018-10-22 NOTE — Assessment & Plan Note (Signed)
Seems to have stabilized On bicarb still Will recheck labs No fluid overload Cognition a little better

## 2018-10-22 NOTE — Assessment & Plan Note (Signed)
BP Readings from Last 3 Encounters:  10/22/18 (!) 176/92  09/16/18 (!) 146/84  09/02/18 138/78   Up again some Will increase the terazosin

## 2018-10-22 NOTE — Telephone Encounter (Signed)
[facility] called with critical labs GFR 8.78 Creatinine 8.01

## 2018-10-22 NOTE — Progress Notes (Signed)
Subjective:    Patient ID: Devin Harmon, male    DOB: 12/15/1955, 62 y.o.   MRN: [id number]  HPI Here with wife for follow up of chronic kidney failure  Back out in the yard working Appetite is much better Ongoing memory problems but wife feels he is improved and talking much more  Still on the BP meds No apparent problems  Current Outpatient Medications on File Prior to Visit  Medication Sig Dispense Refill  . acetaminophen (TYLENOL) 500 MG tablet Take 500-1,000 mg by mouth every 6 (six) hours as needed for mild pain.    Marland Kitchen amLODipine (NORVASC) 10 MG tablet Take 1 tablet (10 mg total) by mouth daily. 30 tablet 11  . labetalol (NORMODYNE) 100 MG tablet Take 1 tablet (100 mg total) by mouth 2 (two) times daily. 60 tablet 11  . Multiple Vitamin (MULTIVITAMIN WITH MINERALS) TABS tablet Take 1 tablet by mouth daily.    . sodium bicarbonate 650 MG tablet Take 1 tablet (650 mg total) by mouth 3 (three) times daily. 270 tablet 3  . terazosin (HYTRIN) 5 MG capsule Take 1 capsule (5 mg total) by mouth at bedtime. 90 capsule 3  . thiamine 100 MG tablet Take 1 tablet (100 mg total) by mouth daily.     No current facility-administered medications on file prior to visit.     Allergies  Allergen Reactions  . Chlorthalidone     REACTION: felt "bad" and ED  . Hydrocodone Other (See Comments)    Nausea and dizzy  . Sulfonamide Derivatives     REACTION: unspecified    Past Medical History:  Diagnosis Date  . Allergy   . BPH (benign prostatic hypertrophy)   . Hypertension   . Renal insufficiency   . Zoster 2007   facial, hospital 2007    History reviewed. No pertinent surgical history.  Family History  Problem Relation Age of Onset  . Hypertension Mother   . Cancer Mother        lung cancer  . Hypertension Brother   . Heart disease Father   . Coronary artery disease Neg Hx   . Diabetes Neg Hx     Social History   Socioeconomic History  . Marital status: Married   Spouse name: Not on file  . Number of children: 2  . Years of education: Not on file  . Highest education level: Not on file  Occupational History  . Occupation: Museum/gallery curator, building new homes  Social Needs  . Financial resource strain: Not on file  . Food insecurity:    Worry: Not on file    Inability: Not on file  . Transportation needs:    Medical: Not on file    Non-medical: Not on file  Tobacco Use  . Smoking status: Never Smoker  . Smokeless tobacco: Never Used  Substance and Sexual Activity  . Alcohol use: Yes    Comment: occasional  . Drug use: Not Currently  . Sexual activity: Not on file  Lifestyle  . Physical activity:    Days per week: Not on file    Minutes per session: Not on file  . Stress: Not on file  Relationships  . Social connections:    Talks on phone: Not on file    Gets together: Not on file    Attends religious service: Not on file    Active member of club or organization: Not on file    Attends meetings of clubs or organizations: Not  on file    Relationship status: Not on file  . Intimate partner violence:    Fear of current or ex partner: Not on file    Emotionally abused: Not on file    Physically abused: Not on file    Forced sexual activity: Not on file  Other Topics Concern  . Not on file  Social History Narrative  . Not on file   Review of Systems Having post nasal drip --will get cough Sleeps okay but the cough awakens wife    Objective:   Physical Exam  Constitutional: He appears well-developed. No distress.  Cardiovascular: Normal rate, regular rhythm and normal heart sounds. Exam reveals no gallop.  No murmur heard. Respiratory: Effort normal and breath sounds normal. No respiratory distress. He has no wheezes. He has no rales.  Musculoskeletal: He exhibits no edema.  Neurological:  More verbal today           Assessment & Plan:

## 2018-10-23 NOTE — Telephone Encounter (Signed)
This is higher Will contact him

## 2018-10-27 ENCOUNTER — Other Ambulatory Visit: Payer: Self-pay | Admitting: Internal Medicine

## 2018-11-29 ENCOUNTER — Other Ambulatory Visit: Payer: Self-pay

## 2018-11-29 ENCOUNTER — Telehealth: Payer: Self-pay | Admitting: *Deleted

## 2018-11-29 ENCOUNTER — Emergency Department (HOSPITAL_COMMUNITY)
Admission: EM | Admit: 2018-11-29 | Discharge: 2018-11-29 | Discharge status: Against Medical Advice | Payer: 59 | Attending: Emergency Medicine | Admitting: Emergency Medicine

## 2018-11-29 DIAGNOSIS — I129 Hypertensive chronic kidney disease with stage 1 through stage 4 chronic kidney disease, or unspecified chronic kidney disease: Secondary | ICD-10-CM | POA: Diagnosis not present

## 2018-11-29 DIAGNOSIS — R251 Tremor, unspecified: Secondary | ICD-10-CM | POA: Diagnosis not present

## 2018-11-29 DIAGNOSIS — N189 Chronic kidney disease, unspecified: Secondary | ICD-10-CM | POA: Insufficient documentation

## 2018-11-29 DIAGNOSIS — N179 Acute kidney failure, unspecified: Secondary | ICD-10-CM | POA: Insufficient documentation

## 2018-11-29 LAB — COMPREHENSIVE METABOLIC PANEL
ALK PHOS: 50 U/L (ref 38–126)
ALT: 9 U/L (ref 0–44)
AST: 12 U/L — AB (ref 15–41)
Albumin: 4.1 g/dL (ref 3.5–5.0)
Anion gap: 16 — ABNORMAL HIGH (ref 5–15)
BUN: 84 mg/dL — AB (ref 8–23)
CALCIUM: 8.5 mg/dL — AB (ref 8.9–10.3)
CHLORIDE: 105 mmol/L (ref 98–111)
CO2: 17 mmol/L — AB (ref 22–32)
CREATININE: 10.39 mg/dL — AB (ref 0.61–1.24)
GFR calc Af Amer: 5 mL/min — ABNORMAL LOW (ref >60)
GFR calc non Af Amer: 5 mL/min — ABNORMAL LOW (ref >60)
GLUCOSE: 91 mg/dL (ref 70–99)
Potassium: 4.6 mmol/L (ref 3.5–5.1)
SODIUM: 138 mmol/L (ref 135–145)
Total Bilirubin: 0.7 mg/dL (ref 0.3–1.2)
Total Protein: 7.3 g/dL (ref 6.5–8.1)

## 2018-11-29 LAB — CBC
HCT: 29.1 % — ABNORMAL LOW (ref 39.0–52.0)
HEMOGLOBIN: 9.7 g/dL — AB (ref 13.0–17.0)
MCH: 28.4 pg (ref 26.0–34.0)
MCHC: 33.3 g/dL (ref 30.0–36.0)
MCV: 85.1 fL (ref 80.0–100.0)
PLATELETS: 262 10*3/uL (ref 150–400)
RBC: 3.42 MIL/uL — AB (ref 4.22–5.81)
RDW: 14.9 % (ref 11.5–15.5)
WBC: 9.2 10*3/uL (ref 4.0–10.5)
nRBC: 0 % (ref 0.0–0.2)

## 2018-11-29 NOTE — Telephone Encounter (Signed)
Spoke to [place]. She has enough to last 6 days at q4hr dosing. She is asking how long should she do that before changing back to 3 times a day?

## 2018-11-29 NOTE — Telephone Encounter (Signed)
Left detailed message on VM per DPR. 

## 2018-11-29 NOTE — Telephone Encounter (Signed)
Probably only needs the every 4 hours for 2-3 days, and then can go back to tid Send new Rx if needed

## 2018-11-29 NOTE — Telephone Encounter (Signed)
Okay to change to q 4 hours but they should still have plenty from the last Rx If may be if he takes it regularly, that just the three times a day should be enough Let her know that I saw that he was in the ER earlier today

## 2018-11-29 NOTE — ED Notes (Signed)
Pt requesting to leave AMA at this time. Dr. Sedonia Small made aware.

## 2018-11-29 NOTE — ED Triage Notes (Signed)
Pt started having tremors yesterday morning on his way to breakfast. Since then they have gotten progressively worse. Pt denies any pain at this time just complaints of "shaking all over."

## 2018-11-29 NOTE — ED Provider Notes (Signed)
[facility] Emergency Department Provider Note MRN:  [id number]  Arrival date & time: 11/29/18     Chief Complaint   Tremors   History of Present Illness   Devin Harmon is a 62 y.o. year-old male with a history of hypertension, CKD presenting to the ED with chief complaint of tremors.  Patient felt well, felt his normal self yesterday morning.  Gradually throughout the day, began experiencing tremors.  Wife explains the tremors were located in the right arm and right face initially, have since spread to bilateral arms.  Tremors are not relieved by actions or distraction.  Patient denies any other symptoms.  No head trauma, no headache, no vision change, no neck pain, no chest pain or shortness of breath, no abdominal pain, no dysuria, no fever, no numbness weakness to the arms or legs.  Symptoms are constant, progressively worsening, no exacerbating relieving factors.  Review of Systems  A complete 10 system review of systems was obtained and all systems are negative except as noted in the HPI and PMH.   Patient's Health History    Past Medical History:  Diagnosis Date  . Allergy   . BPH (benign prostatic hypertrophy)   . Hypertension   . Renal insufficiency   . Zoster 2007   facial, hospital 2007    No past surgical history on file.  Family History  Problem Relation Age of Onset  . Hypertension Mother   . Cancer Mother        lung cancer  . Hypertension Brother   . Heart disease Father   . Coronary artery disease Neg Hx   . Diabetes Neg Hx     Social History   Socioeconomic History  . Marital status: Married    Spouse name: Not on file  . Number of children: 2  . Years of education: Not on file  . Highest education level: Not on file  Occupational History  . Occupation: Museum/gallery curator, building new homes  Social Needs  . Financial resource strain: Not on file  . Food insecurity:    Worry: Not on file    Inability: Not on file  . Transportation  needs:    Medical: Not on file    Non-medical: Not on file  Tobacco Use  . Smoking status: Never Smoker  . Smokeless tobacco: Never Used  Substance and Sexual Activity  . Alcohol use: Yes    Comment: occasional  . Drug use: Not Currently  . Sexual activity: Not on file  Lifestyle  . Physical activity:    Days per week: Not on file    Minutes per session: Not on file  . Stress: Not on file  Relationships  . Social connections:    Talks on phone: Not on file    Gets together: Not on file    Attends religious service: Not on file    Active member of club or organization: Not on file    Attends meetings of clubs or organizations: Not on file    Relationship status: Not on file  . Intimate partner violence:    Fear of current or ex partner: Not on file    Emotionally abused: Not on file    Physically abused: Not on file    Forced sexual activity: Not on file  Other Topics Concern  . Not on file  Social History Narrative  . Not on file     Physical Exam  Vital Signs and Nursing Notes reviewed Vitals:  11/29/18 0900 11/29/18 1000  BP: (!) 178/95 (!) 180/98  Pulse: 62 70  Resp: (!) 23 19  Temp:    SpO2: 98% 98%    CONSTITUTIONAL: Well-appearing, NAD NEURO:  Alert and oriented x 3, no focal deficits, persistent tremors to bilateral upper extremities EYES:  eyes equal and reactive ENT/NECK:  no LAD, no JVD CARDIO: Regular rate, well-perfused, normal S1 and S2 PULM:  CTAB no wheezing or rhonchi GI/GU:  normal bowel sounds, non-distended, non-tender MSK/SPINE:  No gross deformities, no edema SKIN:  no rash, atraumatic PSYCH:  Appropriate speech and behavior  Diagnostic and Interventional Summary    EKG Interpretation  Date/Time:  Monday November 29 2018 08:09:34 EST Ventricular Rate:  66 PR Interval:    QRS Duration: 87 QT Interval:  441 QTC Calculation: 463 R Axis:   -30 Text Interpretation:  Sinus rhythm Left axis deviation Baseline wander in lead(s) V3  Confirmed by Gerlene Fee [phone]) on 11/29/2018 8:48:57 AM      Labs Reviewed  CBC - Abnormal; Notable for the following components:      Result Value   RBC 3.42 (*)    Hemoglobin 9.7 (*)    HCT 29.1 (*)    All other components within normal limits  COMPREHENSIVE METABOLIC PANEL - Abnormal; Notable for the following components:   CO2 17 (*)    BUN 84 (*)    Creatinine, Ser 10.39 (*)    Calcium 8.5 (*)    AST 12 (*)    GFR calc non Af Amer 5 (*)    GFR calc Af Amer 5 (*)    Anion gap 16 (*)    All other components within normal limits    No orders to display    Medications - No data to display   Procedures Critical Care Critical Care Documentation Critical care time provided by me (excluding procedures): 45 minutes  Condition necessitating critical care: Renal failure requiring urgent dialysis  Components of critical care management: reviewing of prior records, laboratory and imaging interpretation, frequent re-examination and reassessment of vital signs, discussion with consulting services, discussions regarding end-of-life care and management.    ED Course and Medical Decision Making  I have reviewed the triage vital signs and the nursing notes.  Pertinent labs & imaging results that were available during my care of the patient were reviewed by me and considered in my medical decision making (see below for details).  Favoring metabolic disarray or electrolyte disturbance in this 62 year old male with history of CKD here with persistent tremor that is not relieved by distraction or action.  Also considering neurological condition with this is felt to be less likely given patient's history.  Labs pending.  Clinical Course as of Nov 29 1612  Mon Nov 29, 2018  9485 Discussed laboratory results with nephrology, who recommends admission for restarting dialysis.  Awaiting callback from neurology to discuss any further evaluation needed regarding patient's tremors.   [MB]    0915 I had a conversation with patient and wife, a clarification was made.  Patient was never on dialysis, refused it during his admission in September.  Looking back at the documentation, they were warned of worsening renal failure and death, still refused dialysis.   [MB]  [phone] When asked the patient why he refused dialysis, he explains that he does not know what it is.  Did a little bit of education with the patient, patient agrees to come into the hospital for further education, further discussions about  possible dialysis   [MB]  0916 , and hopefully management of his symptoms.   [MB]  [phone] Further discussions with patient and wife.  Patient states he wants to go home, does not want to be admitted.  Aware of the risks of continued renal failure and death if he goes home.  Not interested in dialysis.  Will ask nephrology to weigh in on any other home interventions.   [MB]  0940 Discussed tremors with neurology, who explains that if the patient has asterixis it is basically a confirmatory test for a metabolic etiology of the patient's tremors.  I reassessed the patient and he does have clear asterixis, therefore no need for further neurologic testing.   [MB]  [phone] Dr. Royce Macadamia of nephrology is in-house and is kind enough to come discuss options with patient and wife.  Regardless, the recommendation will be admission for dialysis and/or initiation of palliative care.  If patient does not agree with admission, will have to leave [place].   [MB]    Clinical Course User Index [MB] Sedonia Small Barth Kirks, MD    Patient requesting to leave, advised to stay and speak with nephrology who is on the way.  Patient still wants to leave, left the emergency department [place], fully aware of the risk of worsening renal failure and death at home.  Barth Kirks. Sedonia Small, [place] [email or web address]  Final Clinical Impressions(s) / ED  Diagnoses     ICD-10-CM   1. Acute renal failure, unspecified acute renal failure type ([place]) N17.9   2. Tremor R25.1     ED Discharge Orders    None         Maudie Flakes, MD 11/29/18 [phone]

## 2018-11-29 NOTE — Telephone Encounter (Signed)
Spoke to pts wife, [place], who states pt had d/c sodium bicarbonate. He went to breakfast on yesterday, and noticed he had developed a tremor. [place] gave him 4 doses on yesterday, as she did not see any improvement with TID. She is wanting to know if Dr Silvio Pate can change dosing to q4h as it works well for ptl at least until tremors have eased. His tremors are so severe that he is unable to hold items in his hands. Rx can be sent to [facility], if so. pls advise

## 2019-02-10 ENCOUNTER — Other Ambulatory Visit: Payer: Self-pay

## 2019-02-10 ENCOUNTER — Encounter: Payer: Self-pay | Admitting: Internal Medicine

## 2019-02-10 ENCOUNTER — Ambulatory Visit: Payer: 59 | Admitting: Internal Medicine

## 2019-02-10 VITALS — BP 156/80 | HR 63 | Temp 98.3°F | Ht 68.0 in | Wt 178.2 lb

## 2019-02-10 DIAGNOSIS — N185 Chronic kidney disease, stage 5: Secondary | ICD-10-CM

## 2019-02-10 DIAGNOSIS — I10 Essential (primary) hypertension: Secondary | ICD-10-CM | POA: Diagnosis not present

## 2019-02-10 DIAGNOSIS — N400 Enlarged prostate without lower urinary tract symptoms: Secondary | ICD-10-CM | POA: Diagnosis not present

## 2019-02-10 DIAGNOSIS — J301 Allergic rhinitis due to pollen: Secondary | ICD-10-CM | POA: Insufficient documentation

## 2019-02-10 MED ORDER — MONTELUKAST SODIUM 10 MG PO TABS: 10.0000 mg | ORAL_TABLET | Freq: Every day | ORAL | 3 refills | Status: DC

## 2019-02-10 NOTE — Assessment & Plan Note (Signed)
No obvious fluid overload or uremia Will recheck labs Still won't consider dialysis

## 2019-02-10 NOTE — Assessment & Plan Note (Signed)
Still voids a fair bit Stream seems fine

## 2019-02-10 NOTE — Progress Notes (Signed)
Subjective:    Patient ID: Devin Harmon, male    DOB: Jan 13, 1956, 63 y.o.   MRN: [id number]  HPI Here for follow up of renal failure With wife as usual  Allergies are kicking in Giving cetirizine but not helping too much Having some SOB and dry cough Mostly clear rhinorrhea No fever No history of personal asthma--but runs in the family  No edema Right ankle puffy one night--got better No apparent cognitive change  Current Outpatient Medications on File Prior to Visit  Medication Sig Dispense Refill  . acetaminophen (TYLENOL) 500 MG tablet Take 500-1,000 mg by mouth every 6 (six) hours as needed for mild pain.    Marland Kitchen amLODipine (NORVASC) 10 MG tablet Take 1 tablet (10 mg total) by mouth daily. 30 tablet 11  . cetirizine (ZYRTEC) 10 MG tablet Take 10 mg by mouth daily.    Marland Kitchen labetalol (NORMODYNE) 100 MG tablet Take 1 tablet (100 mg total) by mouth 2 (two) times daily. 60 tablet 11  . Multiple Vitamin (MULTIVITAMIN WITH MINERALS) TABS tablet Take 1 tablet by mouth daily.    . sodium bicarbonate 650 MG tablet Take 1 tablet (650 mg total) by mouth 3 (three) times daily. 270 tablet 3  . terazosin (HYTRIN) 10 MG capsule Take 1 capsule (10 mg total) by mouth at bedtime. 30 capsule 11  . thiamine 100 MG tablet Take 1 tablet (100 mg total) by mouth daily.     No current facility-administered medications on file prior to visit.     Allergies  Allergen Reactions  . Chlorthalidone     REACTION: felt "bad" and ED  . Hydrocodone Other (See Comments)    Nausea and dizzy  . Sulfonamide Derivatives     REACTION: unspecified    Past Medical History:  Diagnosis Date  . Allergy   . BPH (benign prostatic hypertrophy)   . Hypertension   . Renal insufficiency   . Zoster 2007   facial, hospital 2007    History reviewed. No pertinent surgical history.  Family History  Problem Relation Age of Onset  . Hypertension Mother   . Cancer Mother        lung cancer  . Hypertension Brother    . Heart disease Father   . Coronary artery disease Neg Hx   . Diabetes Neg Hx     Social History   Socioeconomic History  . Marital status: Married    Spouse name: Not on file  . Number of children: 2  . Years of education: Not on file  . Highest education level: Not on file  Occupational History  . Occupation: Museum/gallery curator, building new homes  Social Needs  . Financial resource strain: Not on file  . Food insecurity:    Worry: Not on file    Inability: Not on file  . Transportation needs:    Medical: Not on file    Non-medical: Not on file  Tobacco Use  . Smoking status: Never Smoker  . Smokeless tobacco: Never Used  Substance and Sexual Activity  . Alcohol use: Yes    Comment: occasional  . Drug use: Not Currently  . Sexual activity: Not on file  Lifestyle  . Physical activity:    Days per week: Not on file    Minutes per session: Not on file  . Stress: Not on file  Relationships  . Social connections:    Talks on phone: Not on file    Gets together: Not on file  Attends religious service: Not on file    Active member of club or organization: Not on file    Attends meetings of clubs or organizations: Not on file    Relationship status: Not on file  . Intimate partner violence:    Fear of current or ex partner: Not on file    Emotionally abused: Not on file    Physically abused: Not on file    Forced sexual activity: Not on file  Other Topics Concern  . Not on file  Social History Narrative  . Not on file   Review of Systems Weight is stable Eating okay Continues to stay active in the yard    Objective:   Physical Exam  Constitutional: He appears well-developed. No distress.  HENT:  Marked nasal congestion and mouth breathing Pharynx normal  Neck: No thyromegaly present.  Cardiovascular: Normal rate, regular rhythm and normal heart sounds. Exam reveals no gallop.  No murmur heard. Respiratory: Effort normal. No respiratory distress. He has no  wheezes. He has no rales.  Some decreased breath sounds at bases but no dullness  Musculoskeletal:        General: No edema.  Lymphadenopathy:    He has no cervical adenopathy.  Neurological:  Normal alertness Makes some jokes--good interaction  Psychiatric: He has a normal mood and affect. His behavior is normal.           Assessment & Plan:

## 2019-02-10 NOTE — Assessment & Plan Note (Signed)
BP Readings from Last 3 Encounters:  02/10/19 (!) 156/80  11/29/18 (!) 180/98  10/22/18 (!) 176/92   This is good for him

## 2019-02-10 NOTE — Assessment & Plan Note (Signed)
His drainage and congestion seem mostly in his head Not fond of flonase Will try montelukast

## 2019-02-10 NOTE — Patient Instructions (Signed)
Please consider trying over the counter fluticasone (flonase) if the 2 pills don't control your symptoms.

## 2019-02-11 LAB — RENAL FUNCTION PANEL
Albumin: 3.9 g/dL (ref 3.5–5.2)
BUN: 118 mg/dL (ref 6–23)
CALCIUM: 7.9 mg/dL — AB (ref 8.4–10.5)
CHLORIDE: 104 meq/L (ref 96–112)
CO2: 14 meq/L — AB (ref 19–32)
Creatinine, Ser: 13.4 mg/dL (ref 0.40–1.50)
GFR: 4.56 mL/min — AB (ref >60.00)
Glucose, Bld: 95 mg/dL (ref 70–99)
POTASSIUM: 5 meq/L (ref 3.5–5.1)
Phosphorus: 6.5 mg/dL — ABNORMAL HIGH (ref 2.3–4.6)
Sodium: 136 mEq/L (ref 135–145)

## 2019-02-11 LAB — CBC
MCHC: 34.1 g/dL (ref 30.0–36.0)
MCV: 82.9 fl (ref 78.0–100.0)
Platelets: 450 10*3/uL — ABNORMAL HIGH (ref 150.0–400.0)
RBC: 2.94 Mil/uL — AB (ref 4.22–5.81)
RDW: 14.2 % (ref 11.5–15.5)
WBC: 9.7 10*3/uL (ref 4.0–10.5)

## 2019-03-27 ENCOUNTER — Other Ambulatory Visit: Payer: Self-pay

## 2019-03-27 ENCOUNTER — Inpatient Hospital Stay: Payer: 59

## 2019-03-27 ENCOUNTER — Emergency Department: Payer: 59

## 2019-03-27 ENCOUNTER — Inpatient Hospital Stay
Admission: EM | Admit: 2019-03-27 | Discharge: 2019-03-30 | DRG: 291 | Disposition: A | Discharge status: Hospice | Payer: 59 | Attending: Internal Medicine | Admitting: Internal Medicine

## 2019-03-27 DIAGNOSIS — Z8249 Family history of ischemic heart disease and other diseases of the circulatory system: Secondary | ICD-10-CM

## 2019-03-27 DIAGNOSIS — R251 Tremor, unspecified: Secondary | ICD-10-CM | POA: Diagnosis present

## 2019-03-27 DIAGNOSIS — Z79899 Other long term (current) drug therapy: Secondary | ICD-10-CM | POA: Diagnosis not present

## 2019-03-27 DIAGNOSIS — N179 Acute kidney failure, unspecified: Secondary | ICD-10-CM | POA: Diagnosis present

## 2019-03-27 DIAGNOSIS — N400 Enlarged prostate without lower urinary tract symptoms: Secondary | ICD-10-CM | POA: Diagnosis present

## 2019-03-27 DIAGNOSIS — Z888 Allergy status to other drugs, medicaments and biological substances status: Secondary | ICD-10-CM

## 2019-03-27 DIAGNOSIS — N170 Acute kidney failure with tubular necrosis: Secondary | ICD-10-CM | POA: Diagnosis not present

## 2019-03-27 DIAGNOSIS — I132 Hypertensive heart and chronic kidney disease with heart failure and with stage 5 chronic kidney disease, or end stage renal disease: Principal | ICD-10-CM | POA: Diagnosis present

## 2019-03-27 DIAGNOSIS — Z452 Encounter for adjustment and management of vascular access device: Secondary | ICD-10-CM | POA: Diagnosis not present

## 2019-03-27 DIAGNOSIS — Z20828 Contact with and (suspected) exposure to other viral communicable diseases: Secondary | ICD-10-CM | POA: Diagnosis present

## 2019-03-27 DIAGNOSIS — D631 Anemia in chronic kidney disease: Secondary | ICD-10-CM | POA: Diagnosis present

## 2019-03-27 DIAGNOSIS — E875 Hyperkalemia: Secondary | ICD-10-CM | POA: Diagnosis present

## 2019-03-27 DIAGNOSIS — Z882 Allergy status to sulfonamides status: Secondary | ICD-10-CM

## 2019-03-27 DIAGNOSIS — Z7951 Long term (current) use of inhaled steroids: Secondary | ICD-10-CM

## 2019-03-27 DIAGNOSIS — Z885 Allergy status to narcotic agent status: Secondary | ICD-10-CM

## 2019-03-27 DIAGNOSIS — J810 Acute pulmonary edema: Secondary | ICD-10-CM | POA: Diagnosis not present

## 2019-03-27 DIAGNOSIS — J9601 Acute respiratory failure with hypoxia: Secondary | ICD-10-CM | POA: Diagnosis present

## 2019-03-27 DIAGNOSIS — N186 End stage renal disease: Secondary | ICD-10-CM

## 2019-03-27 DIAGNOSIS — I10 Essential (primary) hypertension: Secondary | ICD-10-CM | POA: Diagnosis not present

## 2019-03-27 DIAGNOSIS — I4581 Long QT syndrome: Secondary | ICD-10-CM | POA: Diagnosis not present

## 2019-03-27 DIAGNOSIS — R0602 Shortness of breath: Secondary | ICD-10-CM | POA: Diagnosis not present

## 2019-03-27 DIAGNOSIS — Z801 Family history of malignant neoplasm of trachea, bronchus and lung: Secondary | ICD-10-CM | POA: Diagnosis not present

## 2019-03-27 DIAGNOSIS — I120 Hypertensive chronic kidney disease with stage 5 chronic kidney disease or end stage renal disease: Secondary | ICD-10-CM | POA: Diagnosis not present

## 2019-03-27 DIAGNOSIS — E872 Acidosis: Secondary | ICD-10-CM | POA: Diagnosis present

## 2019-03-27 DIAGNOSIS — R0689 Other abnormalities of breathing: Secondary | ICD-10-CM | POA: Diagnosis not present

## 2019-03-27 DIAGNOSIS — Z9115 Patient's noncompliance with renal dialysis: Secondary | ICD-10-CM

## 2019-03-27 DIAGNOSIS — J811 Chronic pulmonary edema: Secondary | ICD-10-CM | POA: Diagnosis not present

## 2019-03-27 DIAGNOSIS — Z66 Do not resuscitate: Secondary | ICD-10-CM | POA: Diagnosis present

## 2019-03-27 DIAGNOSIS — R404 Transient alteration of awareness: Secondary | ICD-10-CM | POA: Diagnosis not present

## 2019-03-27 DIAGNOSIS — I5033 Acute on chronic diastolic (congestive) heart failure: Secondary | ICD-10-CM | POA: Diagnosis present

## 2019-03-27 LAB — COMPREHENSIVE METABOLIC PANEL
ALT: 10 U/L (ref 0–44)
ALT: 10 U/L (ref 0–44)
AST: 10 U/L — ABNORMAL LOW (ref 15–41)
AST: 11 U/L — ABNORMAL LOW (ref 15–41)
Albumin: 3.9 g/dL (ref 3.5–5.0)
Albumin: 3.8 g/dL (ref 3.5–5.0)
Alkaline Phosphatase: 55 U/L (ref 38–126)
Alkaline Phosphatase: 58 U/L (ref 38–126)
Anion gap: 19 — ABNORMAL HIGH (ref 5–15)
Anion gap: 21 — ABNORMAL HIGH (ref 5–15)
BUN: 145 mg/dL — ABNORMAL HIGH (ref 8–23)
BUN: 92 mg/dL — ABNORMAL HIGH (ref 8–23)
CO2: 12 mmol/L — ABNORMAL LOW (ref 22–32)
CO2: 16 mmol/L — ABNORMAL LOW (ref 22–32)
Calcium: 6.7 mg/dL — ABNORMAL LOW (ref 8.9–10.3)
Calcium: 7.5 mg/dL — ABNORMAL LOW (ref 8.9–10.3)
Chloride: 103 mmol/L (ref 98–111)
Chloride: 101 mmol/L (ref 98–111)
Creatinine, Ser: 18.91 mg/dL — ABNORMAL HIGH (ref 0.61–1.24)
Creatinine, Ser: 13.86 mg/dL — ABNORMAL HIGH (ref 0.61–1.24)
GFR calc Af Amer: 3 mL/min — ABNORMAL LOW (ref >60)
GFR calc Af Amer: 4 mL/min — ABNORMAL LOW (ref >60)
GFR calc non Af Amer: 2 mL/min — ABNORMAL LOW (ref >60)
GFR calc non Af Amer: 3 mL/min — ABNORMAL LOW (ref >60)
Glucose, Bld: 113 mg/dL — ABNORMAL HIGH (ref 70–99)
Glucose, Bld: 102 mg/dL — ABNORMAL HIGH (ref 70–99)
Potassium: 5.4 mmol/L — ABNORMAL HIGH (ref 3.5–5.1)
Potassium: 4.3 mmol/L (ref 3.5–5.1)
Sodium: 134 mmol/L — ABNORMAL LOW (ref 135–145)
Sodium: 138 mmol/L (ref 135–145)
Total Bilirubin: 0.6 mg/dL (ref 0.3–1.2)
Total Bilirubin: 1 mg/dL (ref 0.3–1.2)
Total Protein: 7.4 g/dL (ref 6.5–8.1)
Total Protein: 6.7 g/dL (ref 6.5–8.1)

## 2019-03-27 LAB — BLOOD GAS, VENOUS
Acid-base deficit: 15.1 mmol/L — ABNORMAL HIGH (ref 0.0–2.0)
Bicarbonate: 12.4 mmol/L — ABNORMAL LOW (ref 20.0–28.0)
FIO2: 0.28
O2 Saturation: 88.6 %
Patient temperature: 37
pCO2, Ven: 34 mmHg — ABNORMAL LOW (ref 44.0–60.0)
pH, Ven: 7.17 — CL (ref 7.250–7.430)
pO2, Ven: 71 mmHg — ABNORMAL HIGH (ref 32.0–45.0)

## 2019-03-27 LAB — LACTIC ACID, PLASMA
Lactic Acid, Venous: 0.9 mmol/L (ref 0.5–1.9)
Lactic Acid, Venous: 0.6 mmol/L (ref 0.5–1.9)

## 2019-03-27 LAB — CBC
HCT: 22.5 % — ABNORMAL LOW (ref 39.0–52.0)
Hemoglobin: 7.5 g/dL — ABNORMAL LOW (ref 13.0–17.0)
MCH: 26.8 pg (ref 26.0–34.0)
MCHC: 33.3 g/dL (ref 30.0–36.0)
MCV: 80.4 fL (ref 80.0–100.0)
Platelets: 267 10*3/uL (ref 150–400)
RBC: 2.8 MIL/uL — ABNORMAL LOW (ref 4.22–5.81)
RDW: 16.1 % — ABNORMAL HIGH (ref 11.5–15.5)
WBC: 10.2 10*3/uL (ref 4.0–10.5)
nRBC: 0 % (ref 0.0–0.2)

## 2019-03-27 LAB — CBC WITH DIFFERENTIAL/PLATELET
Abs Immature Granulocytes: 0.07 10*3/uL (ref 0.00–0.07)
Basophils Absolute: 0.1 10*3/uL (ref 0.0–0.1)
Basophils Relative: 1 %
Eosinophils Absolute: 0.2 10*3/uL (ref 0.0–0.5)
Eosinophils Relative: 1 %
HCT: 24.6 % — ABNORMAL LOW (ref 39.0–52.0)
Hemoglobin: 8 g/dL — ABNORMAL LOW (ref 13.0–17.0)
Immature Granulocytes: 1 %
Lymphocytes Relative: 10 %
Lymphs Abs: 1.1 10*3/uL (ref 0.7–4.0)
MCH: 26.7 pg (ref 26.0–34.0)
MCHC: 32.5 g/dL (ref 30.0–36.0)
MCV: 82 fL (ref 80.0–100.0)
Monocytes Absolute: 0.5 10*3/uL (ref 0.1–1.0)
Monocytes Relative: 4 %
Neutro Abs: 9.2 10*3/uL — ABNORMAL HIGH (ref 1.7–7.7)
Neutrophils Relative %: 83 %
Platelets: 314 10*3/uL (ref 150–400)
RBC: 3 MIL/uL — ABNORMAL LOW (ref 4.22–5.81)
RDW: 16.5 % — ABNORMAL HIGH (ref 11.5–15.5)
WBC: 11 10*3/uL — ABNORMAL HIGH (ref 4.0–10.5)
nRBC: 0 % (ref 0.0–0.2)

## 2019-03-27 LAB — FIBRIN DERIVATIVES D-DIMER (ARMC ONLY): Fibrin derivatives D-dimer (ARMC): 2009.08 ng/mL (FEU) — ABNORMAL HIGH (ref 0.00–499.00)

## 2019-03-27 LAB — PHOSPHORUS
Phosphorus: 7.6 mg/dL — ABNORMAL HIGH (ref 2.5–4.6)
Phosphorus: 7.3 mg/dL — ABNORMAL HIGH (ref 2.5–4.6)

## 2019-03-27 LAB — PROCALCITONIN: Procalcitonin: 1.89 ng/mL

## 2019-03-27 LAB — MAGNESIUM
Magnesium: 2 mg/dL (ref 1.7–2.4)
Magnesium: 1.8 mg/dL (ref 1.7–2.4)

## 2019-03-27 LAB — PROTIME-INR
INR: 1.2 (ref 0.8–1.2)
Prothrombin Time: 14.9 seconds (ref 11.4–15.2)

## 2019-03-27 LAB — IRON AND TIBC
Iron: 24 ug/dL — ABNORMAL LOW (ref 45–182)
Saturation Ratios: 11 % — ABNORMAL LOW (ref 17.9–39.5)
TIBC: 226 ug/dL — ABNORMAL LOW (ref 250–450)
UIBC: 202 ug/dL

## 2019-03-27 LAB — BRAIN NATRIURETIC PEPTIDE: B Natriuretic Peptide: 3985 pg/mL — ABNORMAL HIGH (ref 0.0–100.0)

## 2019-03-27 LAB — LACTATE DEHYDROGENASE: LDH: 274 U/L — ABNORMAL HIGH (ref 98–192)

## 2019-03-27 LAB — TRIGLYCERIDES: Triglycerides: 128 mg/dL (ref <150)

## 2019-03-27 LAB — FIBRINOGEN: Fibrinogen: 737 mg/dL — ABNORMAL HIGH (ref 210–475)

## 2019-03-27 LAB — FERRITIN: Ferritin: 517 ng/mL — ABNORMAL HIGH (ref 24–336)

## 2019-03-27 LAB — SARS CORONAVIRUS 2 BY RT PCR (HOSPITAL ORDER, PERFORMED IN ~~LOC~~ HOSPITAL LAB): SARS Coronavirus 2: NEGATIVE

## 2019-03-27 LAB — GLUCOSE, CAPILLARY: Glucose-Capillary: 101 mg/dL — ABNORMAL HIGH (ref 70–99)

## 2019-03-27 LAB — TROPONIN I: Troponin I: 0.05 ng/mL (ref <0.03)

## 2019-03-27 LAB — MRSA PCR SCREENING: MRSA by PCR: NEGATIVE

## 2019-03-27 MED ORDER — ACETAMINOPHEN 650 MG RE SUPP: 650.0000 mg | Freq: Four times a day (QID) | RECTAL | Status: DC | PRN

## 2019-03-27 MED ORDER — LORATADINE 10 MG PO TABS
10.0000 mg | ORAL_TABLET | Freq: Every day | ORAL | Status: DC
  Administered 2019-03-28 – 2019-03-30 (×3): 10 mg via ORAL
  Filled 2019-03-27 (×3): qty 1

## 2019-03-27 MED ORDER — AMLODIPINE BESYLATE 10 MG PO TABS
10.0000 mg | ORAL_TABLET | Freq: Every evening | ORAL | Status: DC
  Administered 2019-03-27 – 2019-03-29 (×3): 10 mg via ORAL
  Filled 2019-03-27 (×3): qty 1

## 2019-03-27 MED ORDER — DIAZEPAM 5 MG/ML IJ SOLN
5.0000 mg | INTRAMUSCULAR | Status: DC | PRN
  Administered 2019-03-28: 5 mg via INTRAVENOUS
  Filled 2019-03-27 (×2): qty 2

## 2019-03-27 MED ORDER — ONDANSETRON HCL 4 MG/2ML IJ SOLN: 4.0000 mg | Freq: Four times a day (QID) | INTRAMUSCULAR | Status: DC | PRN

## 2019-03-27 MED ORDER — DIAZEPAM 1 MG/ML PO SOLN
5.0000 mg | Freq: Once | ORAL | Status: AC
  Administered 2019-03-27: 5 mg via ORAL

## 2019-03-27 MED ORDER — SODIUM BICARBONATE 8.4 % IV SOLN
50.0000 meq | Freq: Once | INTRAVENOUS | Status: AC
  Administered 2019-03-27: 12:00:00 50 meq via INTRAVENOUS

## 2019-03-27 MED ORDER — SODIUM CHLORIDE 0.9 % IV SOLN
1.0000 g | Freq: Once | INTRAVENOUS | Status: DC
  Administered 2019-03-27: 1 g via INTRAVENOUS
  Filled 2019-03-27: qty 10

## 2019-03-27 MED ORDER — ALTEPLASE 2 MG IJ SOLR: 2.0000 mg | Freq: Once | INTRAMUSCULAR | Status: DC | PRN

## 2019-03-27 MED ORDER — MIDAZOLAM HCL 2 MG/2ML IJ SOLN
2.0000 mg | Freq: Once | INTRAMUSCULAR | Status: AC
  Administered 2019-03-27: 2 mg via INTRAVENOUS

## 2019-03-27 MED ORDER — NEPRO/CARBSTEADY PO LIQD: 237.0000 mL | ORAL | Status: DC | PRN

## 2019-03-27 MED ORDER — HEPARIN SODIUM (PORCINE) 1000 UNIT/ML DIALYSIS: 1000.0000 [IU] | INTRAMUSCULAR | Status: DC | PRN

## 2019-03-27 MED ORDER — ORAL CARE MOUTH RINSE
15.0000 mL | Freq: Two times a day (BID) | OROMUCOSAL | Status: DC
  Administered 2019-03-27 – 2019-03-29 (×4): 15 mL via OROMUCOSAL

## 2019-03-27 MED ORDER — CHLORHEXIDINE GLUCONATE CLOTH 2 % EX PADS
6.0000 | MEDICATED_PAD | Freq: Every day | CUTANEOUS | Status: DC
  Administered 2019-03-28 – 2019-03-30 (×2): 6 via TOPICAL

## 2019-03-27 MED ORDER — FUROSEMIDE 10 MG/ML IJ SOLN
80.0000 mg | Freq: Once | INTRAMUSCULAR | Status: AC
  Administered 2019-03-27: 12:00:00 80 mg via INTRAVENOUS
  Filled 2019-03-27: qty 8

## 2019-03-27 MED ORDER — SODIUM BICARBONATE 650 MG PO TABS
650.0000 mg | ORAL_TABLET | Freq: Three times a day (TID) | ORAL | Status: DC
  Administered 2019-03-27 – 2019-03-30 (×8): 650 mg via ORAL
  Filled 2019-03-27 (×11): qty 1

## 2019-03-27 MED ORDER — NITROGLYCERIN 2 % TD OINT
1.0000 [in_us] | TOPICAL_OINTMENT | TRANSDERMAL | Status: AC
  Administered 2019-03-27: 1 [in_us] via TOPICAL
  Filled 2019-03-27: qty 1

## 2019-03-27 MED ORDER — FLUTICASONE PROPIONATE 50 MCG/ACT NA SUSP
1.0000 | Freq: Every day | NASAL | Status: DC
  Administered 2019-03-27: 2 via NASAL
  Administered 2019-03-29: 22:00:00 1 via NASAL
  Filled 2019-03-27: qty 16

## 2019-03-27 MED ORDER — LABETALOL HCL 100 MG PO TABS
100.0000 mg | ORAL_TABLET | Freq: Every day | ORAL | Status: DC
  Administered 2019-03-27 – 2019-03-29 (×3): 100 mg via ORAL
  Filled 2019-03-27 (×5): qty 1

## 2019-03-27 MED ORDER — POLYETHYLENE GLYCOL 3350 17 G PO PACK: 17.0000 g | PACK | Freq: Every day | ORAL | Status: DC | PRN

## 2019-03-27 MED ORDER — SODIUM CHLORIDE 0.9 % IV SOLN: 100.0000 mL | INTRAVENOUS | Status: DC | PRN

## 2019-03-27 MED ORDER — NITROGLYCERIN 2 % TD OINT
1.0000 [in_us] | TOPICAL_OINTMENT | Freq: Four times a day (QID) | TRANSDERMAL | Status: AC
  Administered 2019-03-27 – 2019-03-28 (×3): 1 [in_us] via TOPICAL
  Filled 2019-03-27 (×2): qty 1

## 2019-03-27 MED ORDER — ONDANSETRON HCL 4 MG PO TABS: 4.0000 mg | ORAL_TABLET | Freq: Four times a day (QID) | ORAL | Status: DC | PRN

## 2019-03-27 MED ORDER — ACETAMINOPHEN 325 MG PO TABS: 650.0000 mg | ORAL_TABLET | Freq: Four times a day (QID) | ORAL | Status: DC | PRN

## 2019-03-27 MED ORDER — CALCIUM GLUCONATE-NACL 1-0.675 GM/50ML-% IV SOLN
1.0000 g | Freq: Once | INTRAVENOUS | Status: DC
  Filled 2019-03-27: qty 50

## 2019-03-27 MED ORDER — TUBERCULIN PPD 5 UNIT/0.1ML ID SOLN
5.0000 [IU] | Freq: Once | INTRADERMAL | Status: AC
  Administered 2019-03-27: 23:00:00 5 [IU] via INTRADERMAL
  Filled 2019-03-27 (×2): qty 0.1

## 2019-03-27 MED ORDER — MIDAZOLAM HCL 2 MG/2ML IJ SOLN
INTRAMUSCULAR | Status: AC
  Administered 2019-03-27: 2 mg via INTRAVENOUS
  Filled 2019-03-27: qty 2

## 2019-03-27 MED ORDER — CALCIUM GLUCONATE 10 % IV SOLN
INTRAVENOUS | Status: AC
  Filled 2019-03-27: qty 10

## 2019-03-27 MED ORDER — HEPARIN SODIUM (PORCINE) 5000 UNIT/ML IJ SOLN
5000.0000 [IU] | Freq: Three times a day (TID) | INTRAMUSCULAR | Status: DC
  Administered 2019-03-27 – 2019-03-28 (×2): 5000 [IU] via SUBCUTANEOUS
  Filled 2019-03-27: qty 1

## 2019-03-27 MED ORDER — TERAZOSIN HCL 5 MG PO CAPS
10.0000 mg | ORAL_CAPSULE | Freq: Every day | ORAL | Status: DC
  Filled 2019-03-27: qty 2

## 2019-03-27 NOTE — ED Notes (Signed)
Pt appeared to be desat'ing from monitor - this nurse went to room and pt had removed oxygen and was desat'ing in the 80's with good waveform - replaced O2 high flow at 8L and O2 sat improved to the 90-95% Pt is c/o SOB and is having labored respirations

## 2019-03-27 NOTE — Consult Note (Signed)
Name: Devin Harmon MRN: [id number] DOB: 1956-02-02     CONSULTATION DATE: 03/27/2019  REFERRING MD :  Brett Albino  CHIEF COMPLAINT:  SOB     HISTORY OF PRESENT ILLNESS: 63 y.o. male with a known history of hypertension and end-stage renal disease Presented with progressive SOB and tremors x 4 days +lower ext edema +progressive kidney failure Presented with high blood pressure  K 5.4, creatinine 18.91 BNP 3985,  hemoglobin 8.0.   Critically ill confused unable to provide complete ROS  He had refused dialysis for many months Now will need it emergently     COVID-19 NEGATIVE: Acute COVID-19 infection ruled out by PCR.    BP (!) 197/94 (BP Location: Left Leg)   Pulse 89   Temp (!) 97 F (36.1 C) (Axillary)   Resp (!) 22   Ht 5\' 9"  (1.753 m)   Wt 93 kg   SpO2 100%   BMI 30.28 kg/m      PAST MEDICAL HISTORY :   has a past medical history of Allergy, BPH (benign prostatic hypertrophy), Hypertension, Renal insufficiency, and Zoster (2007).  has no past surgical history on file. Prior to Admission medications   Medication Sig Start Date End Date Taking? Authorizing Provider  acetaminophen (TYLENOL) 500 MG tablet Take 500-1,000 mg by mouth every 6 (six) hours as needed for mild pain.   Yes [provider]  amLODipine (NORVASC) 10 MG tablet Take 1 tablet (10 mg total) by mouth daily. Patient taking differently: Take 10 mg by mouth every evening.  10/13/18  Yes Venia Carbon, MD  cetirizine (ZYRTEC) 10 MG tablet Take 10 mg by mouth every evening.    Yes [provider]  fluticasone (FLONASE) 50 MCG/ACT nasal spray Place 1-2 sprays into both nostrils daily.   Yes [provider]  labetalol (NORMODYNE) 100 MG tablet Take 1 tablet (100 mg total) by mouth 2 (two) times daily. Patient taking differently: Take 100 mg by mouth at bedtime.  10/13/18  Yes Venia Carbon, MD  Multiple Vitamin (MULTIVITAMIN WITH MINERALS) TABS tablet Take 1 tablet  by mouth daily. 09/02/18  Yes Samuella Cota, MD  sodium bicarbonate 650 MG tablet Take 1 tablet (650 mg total) by mouth 3 (three) times daily. 10/08/18  Yes Venia Carbon, MD  terazosin (HYTRIN) 10 MG capsule Take 1 capsule (10 mg total) by mouth at bedtime. Patient taking differently: Take 10 mg by mouth at bedtime as needed.  10/22/18  Yes Venia Carbon, MD  montelukast (SINGULAIR) 10 MG tablet Take 1 tablet (10 mg total) by mouth at bedtime. Patient not taking: Reported on 03/27/2019 02/10/19   Viviana Simpler I, MD  thiamine 100 MG tablet Take 1 tablet (100 mg total) by mouth daily. Patient not taking: Reported on 03/27/2019 09/02/18   Samuella Cota, MD   Allergies  Allergen Reactions  . Chlorthalidone     REACTION: felt "bad" and ED  . Hydrocodone Other (See Comments)    Nausea and dizzy  . Sulfonamide Derivatives     REACTION: unspecified    FAMILY HISTORY:  family history includes Cancer in his mother; Heart disease in his father; Hypertension in his brother and mother. SOCIAL HISTORY:  reports that he has never smoked. He has never used smokeless tobacco. He reports current alcohol use. He reports previous drug use.  REVIEW OF SYSTEMS:   Unable to obtain due to critical illness   VITAL SIGNS: Temp:  [97 F (36.1 C)-97.8  F (36.6 C)] 97 F (36.1 C) (04/26 1429) Pulse Rate:  [33-149] 89 (04/26 1429) Resp:  [22-40] 22 (04/26 1429) BP: (169-197)/(79-105) 197/94 (04/26 1412) SpO2:  [83 %-100 %] 100 % (04/26 1429) Weight:  [93 kg] 93 kg (04/26 1429)  Physical Examination:  GENERAL:critically ill appearing, +resp distress HEAD: Normocephalic, atraumatic.  EYES: Pupils equal, round, reactive to light.  No scleral icterus.  MOUTH: Moist mucosal membrane. NECK: Supple. No JVD.  PULMONARY: +rhonchi, +wheezing CARDIOVASCULAR: S1 and S2. Regular rate and rhythm. No murmurs, rubs, or gallops.  GASTROINTESTINAL: Soft, nontender, -distended. No masses. Positive  bowel sounds. No hepatosplenomegaly.  MUSCULOSKELETAL: No swelling, clubbing, or edema.  NEUROLOGIC:lethargic, tremors SKIN:intact,warm,dry      CULTURE RESULTS   Recent Results (from the past 240 hour(s))  SARS Coronavirus 2 [facility] order, Performed in [facility] hospital lab)     Status: None   Collection Time: 03/27/19 10:55 AM  Result Value Ref Range Status   SARS Coronavirus 2 NEGATIVE NEGATIVE Final    Comment: (NOTE) If result is NEGATIVE SARS-CoV-2 target nucleic acids are NOT DETECTED. The SARS-CoV-2 RNA is generally detectable in upper and lower  respiratory specimens during the acute phase of infection. The lowest  concentration of SARS-CoV-2 viral copies this assay can detect is 250  copies / mL. A negative result does not preclude SARS-CoV-2 infection  and should not be used as the sole basis for treatment or other  patient management decisions.  A negative result may occur with  improper specimen collection / handling, submission of specimen other  than nasopharyngeal swab, presence of viral mutation(s) within the  areas targeted by this assay, and inadequate number of viral copies  (<250 copies / mL). A negative result must be combined with clinical  observations, patient history, and epidemiological information. If result is POSITIVE SARS-CoV-2 target nucleic acids are DETECTED. The SARS-CoV-2 RNA is generally detectable in upper and lower  respiratory specimens dur ing the acute phase of infection.  Positive  results are indicative of active infection with SARS-CoV-2.  Clinical  correlation with patient history and other diagnostic information is  necessary to determine patient infection status.  Positive results do  not rule out bacterial infection or co-infection with other viruses. If result is PRESUMPTIVE POSTIVE SARS-CoV-2 nucleic acids MAY BE PRESENT.   A presumptive positive result was obtained on the submitted specimen  and confirmed on repeat  testing.  While 2019 novel coronavirus  (SARS-CoV-2) nucleic acids may be present in the submitted sample  additional confirmatory testing may be necessary for epidemiological  and / or clinical management purposes  to differentiate between  SARS-CoV-2 and other Sarbecovirus currently known to infect humans.  If clinically indicated additional testing with an alternate test  methodology [phone]) is advised. The SARS-CoV-2 RNA is generally  detectable in upper and lower respiratory sp ecimens during the acute  phase of infection. The expected result is Negative. Fact Sheet for Patients:  StrictlyIdeas.no Fact Sheet for Healthcare Providers: BankingDealers.co.za This test is not yet approved or cleared by the [place] FDA and has been authorized for detection and/or diagnosis of SARS-CoV-2 by FDA under an Emergency Use Authorization (EUA).  This EUA will remain in effect (meaning this test can be used) for the duration of the COVID-19 declaration under Section 564(b)(1) of the Act, 21 U.S.C. section 360bbb-3(b)(1), unless the authorization is terminated or revoked sooner. Performed at [facility], [address], [place], [place] [address]  IMAGING    Dg Chest Portable 1 View  Result Date: 03/27/2019 CLINICAL DATA:  Shortness of breath.  Hypoxia. EXAM: PORTABLE CHEST 1 VIEW COMPARISON:  August 27, 2017 FINDINGS: Bilateral patchy infiltrates are identified. Stable cardiomegaly. The hila and mediastinum are normal. No pneumothorax. No other acute abnormalities. IMPRESSION: Bilateral patchy infiltrates may represent multifocal pneumonia versus pulmonary edema. Recommend clinical correlation. Electronically Signed   By: Dorise Bullion III M.D   On: 03/27/2019 12:06        Indwelling Urinary Catheter continued, requirement due to   Reason to continue Indwelling Urinary Catheter for strict Intake/Output  monitoring for hemodynamic instability   Central Line continued, requirement due to   Reason to continue [employer] Monitoring of central venous pressure or other hemodynamic parameters      ASSESSMENT AND PLAN SYNOPSIS  63 yo male with progressive HTN end orghan damage with acute pulm edema with signs fo acute systolic CHF with progressive renal failure Severe cardio-renal syndrome  Severe ACUTE RESP DISTRESS High risk for intubation Oxygen as needed    CARDIAC FAILURE-check ECHO Will need HD  ACUTE KIDNEY INJURY/Renal Failure -follow chem 7 -follow UO -continue Foley Catheter-assess need Will need HD LEFT IJ TRIPLE LUMEN VASC CATH PLACED   NEUROLOGY Lethargic Aspiration precautions   CARDIAC ICU monitoring   GI GI PROPHYLAXIS as indicated  NUTRITIONAL STATUS DIET-->NPO Constipation protocol as indicated   ENDO - ICU hypoglycemic\Hyperglycemia protocol -check FSBS per protocol   ELECTROLYTES -follow labs as needed -replace as needed -pharmacy consultation and following   DVT/GI PRX ordered TRANSFUSIONS AS NEEDED MONITOR FSBS ASSESS the need for LABS as needed   Critical Care Time devoted to patient care services described in this note is 54 minutes.   Overall, patient is critically ill, prognosis is guarded.  Patient with Multiorgan failure and at high risk for cardiac arrest and death.    Corrin Parker, M.D.  [facility]  Medical Director [place] Director [facility] Cardio-Pulmonary Department

## 2019-03-27 NOTE — Progress Notes (Signed)
Family Meeting Note  Advance Directive:no  Today a meeting took place with the Patient.  Patient is able to participate.  The following clinical team members were present during this meeting:MD  The following were discussed:Patient's diagnosis: acute hypoxic respiratory failure secondary to pulmonary edema, Patient's progosis: Unable to determine and Goals for treatment: partial code- no cardiac resuscitation. agreeable to BiPAP and intubation  Additional follow-up to be provided: prn  Time spent during discussion:20 minutes  Evette Doffing, MD

## 2019-03-27 NOTE — Progress Notes (Signed)
Post HD Assessment    03/27/19 1853  Neurological  Orientation Level Oriented to person;Oriented to situation  Respiratory  Respiratory Pattern Regular  Chest Assessment Chest expansion symmetrical  Bilateral Breath Sounds Diminished;Clear  Cough None  Cardiac  Pulse Regular  Heart Sounds S1, S2;S3  ECG Monitor Yes  Cardiac Rhythm NSR  Vascular  R Radial Pulse +2  L Radial Pulse +2  Edema Generalized  Generalized Edema +2  Psychosocial  Psychosocial (WDL) WDL  Patient Behaviors Anxious;Cooperative;Appropriate for situation

## 2019-03-27 NOTE — ED Notes (Signed)
Pt having multifocal PVC's frequently with short runs of Vtach. EDP informed.

## 2019-03-27 NOTE — Consult Note (Signed)
Date: 03/27/2019                  Patient Name:  JAQUAVIS FELMLEE  MRN: [id number]  DOB: 1955-12-15  Age / Sex: 63 y.o., male         PCP: Venia Carbon, MD                 Service Requesting Consult: ER/ Delman Kitten, MD                 Reason for Consult: Uremia/ESRD            History of Present Illness: Patient is a 63 y.o. male with medical problems of BPH, HTN, advanced CKD, who was admitted to [facility] on 03/27/2019 for evaluation of Acute Shortness of breath.  Patient presented to the emergency room via [facility] from home for tremors and increasing shortness of breath but the past 2 days.  In the ER he was noted to be in quite distress.  Chest x-ray showed bilateral patchy infiltrates representing multifocal pneumonia versus pulmonary edema.  COVID-19 test is negative.  Patient denies any fevers or chills at home.  He has significant tremors of his upper body which has been going on for about 2 weeks or so.  Due to critical illness, history/review of systems from the patient may be limited. Review of labs from last few months show that his creatinine has been in the 12-13 range since September 2019 but patient had been refusing dialysis.  He states he does not have outpatient nephrology. Today's results show potassium of 5.4, BUN of 148 and creatinine of 18.9.  Albumin of 3.9 Urgent consult in the emergency room was requested for evaluation for dialysis  Medications: Outpatient medications: (Not in a hospital admission)   Current medications: Current Facility-Administered Medications  Medication Dose Route Frequency Provider Last Rate Last Dose  . calcium gluconate 1 g/ 50 mL sodium chloride IVPB  1 g Intravenous Once Delman Kitten, MD      . calcium gluconate 10 % injection            Current Outpatient Medications  Medication Sig Dispense Refill  . acetaminophen (TYLENOL) 500 MG tablet Take 500-1,000 mg by mouth every 6 (six) hours as needed for mild pain.    Marland Kitchen  amLODipine (NORVASC) 10 MG tablet Take 1 tablet (10 mg total) by mouth daily. 30 tablet 11  . cetirizine (ZYRTEC) 10 MG tablet Take 10 mg by mouth daily.    Marland Kitchen labetalol (NORMODYNE) 100 MG tablet Take 1 tablet (100 mg total) by mouth 2 (two) times daily. 60 tablet 11  . montelukast (SINGULAIR) 10 MG tablet Take 1 tablet (10 mg total) by mouth at bedtime. 90 tablet 3  . Multiple Vitamin (MULTIVITAMIN WITH MINERALS) TABS tablet Take 1 tablet by mouth daily.    . sodium bicarbonate 650 MG tablet Take 1 tablet (650 mg total) by mouth 3 (three) times daily. 270 tablet 3  . terazosin (HYTRIN) 10 MG capsule Take 1 capsule (10 mg total) by mouth at bedtime. 30 capsule 11  . thiamine 100 MG tablet Take 1 tablet (100 mg total) by mouth daily.        Allergies: Allergies  Allergen Reactions  . Chlorthalidone     REACTION: felt "bad" and ED  . Hydrocodone Other (See Comments)    Nausea and dizzy  . Sulfonamide Derivatives     REACTION: unspecified  Past Medical History: Past Medical History:  Diagnosis Date  . Allergy   . BPH (benign prostatic hypertrophy)   . Hypertension   . Renal insufficiency   . Zoster 2007   facial, hospital 2007     Past Surgical History: History reviewed. No pertinent surgical history.   Family History: Family History  Problem Relation Age of Onset  . Hypertension Mother   . Cancer Mother        lung cancer  . Hypertension Brother   . Heart disease Father   . Coronary artery disease Neg Hx   . Diabetes Neg Hx      Social History: Social History   Socioeconomic History  . Marital status: Married    Spouse name: Not on file  . Number of children: 2  . Years of education: Not on file  . Highest education level: Not on file  Occupational History  . Occupation: Museum/gallery curator, building new homes  Social Needs  . Financial resource strain: Not on file  . Food insecurity:    Worry: Not on file    Inability: Not on file  . Transportation needs:     Medical: Not on file    Non-medical: Not on file  Tobacco Use  . Smoking status: Never Smoker  . Smokeless tobacco: Never Used  Substance and Sexual Activity  . Alcohol use: Yes    Comment: occasional  . Drug use: Not Currently  . Sexual activity: Not on file  Lifestyle  . Physical activity:    Days per week: Not on file    Minutes per session: Not on file  . Stress: Not on file  Relationships  . Social connections:    Talks on phone: Not on file    Gets together: Not on file    Attends religious service: Not on file    Active member of club or organization: Not on file    Attends meetings of clubs or organizations: Not on file    Relationship status: Not on file  . Intimate partner violence:    Fear of current or ex partner: Not on file    Emotionally abused: Not on file    Physically abused: Not on file    Forced sexual activity: Not on file  Other Topics Concern  . Not on file  Social History Narrative  . Not on file     Review of Systems: Limited due to critical condition of the patient Gen: Denies any fevers or chills HEENT: Denies vision or hearing problems CV: No chest pain but does have leg edema Resp: Shortness of breath, some cough.  No hemoptysis GI: Appetite is poor.  Did not eat anything today.  Denies diarrhea or abdominal pain GU : Denies any problem with voiding MS: States he is ambulatory at home Derm:    No complaints Psych: No complaints  Heme: No complaints Neuro: Neurotoxic movements of upper limbs and chest Endocrine.  No complaints  Vital Signs: Blood pressure (!) 170/97, pulse (!) 41, temperature 97.8 F (36.6 C), temperature source Oral, resp. rate (!) 39, height 5\' 9"  (1.753 m), weight 93 kg, SpO2 (!) 89 %.  No intake or output data in the 24 hours ending 03/27/19 1229  Weight trends: Filed Weights   03/27/19 1041  Weight: 93 kg    Physical Exam: General:  Critically ill-appearing, laying in the bed  HEENT  nasal cannula oxygen,  anicteric  Neck:  Supple, distended neck veins  Lungs:  Bilateral  diffuse crackles, oxygen supplementation by [place]  Heart::  Tachycardic, irregular  Abdomen:  Soft, nontender  Extremities:  2+ pitting edema bilaterally  Neurologic:  Alert, able to answer questions appropriately, dystonic upper body and arm movements  Skin:  No acute rashes  Access:  Peripheral IV  Foley: no       Lab results: Basic Metabolic Panel: Recent Labs  Lab 03/27/19 1040  NA 134*  K 5.4*  CL 103  CO2 12*  GLUCOSE 113*  BUN 145*  CREATININE 18.91*  CALCIUM 6.7*    Liver Function Tests: Recent Labs  Lab 03/27/19 1040  AST 10*  ALT 10  ALKPHOS 55  BILITOT 0.6  PROT 7.4  ALBUMIN 3.9   No results for input(s): LIPASE, AMYLASE in the last 168 hours. No results for input(s): AMMONIA in the last 168 hours.  CBC: Recent Labs  Lab 03/27/19 1040  WBC 11.0*  NEUTROABS 9.2*  HGB 8.0*  HCT 24.6*  MCV 82.0  PLT 314    Cardiac Enzymes: No results for input(s): CKTOTAL, TROPONINI in the last 168 hours.  BNP: Invalid input(s): POCBNP  CBG: No results for input(s): GLUCAP in the last 168 hours.  Microbiology: Recent Results (from the past 720 hour(s))  SARS Coronavirus 2 [facility] order, Performed in [facility] hospital lab)     Status: None   Collection Time: 03/27/19 10:55 AM  Result Value Ref Range Status   SARS Coronavirus 2 NEGATIVE NEGATIVE Final    Comment: (NOTE) If result is NEGATIVE SARS-CoV-2 target nucleic acids are NOT DETECTED. The SARS-CoV-2 RNA is generally detectable in upper and lower  respiratory specimens during the acute phase of infection. The lowest  concentration of SARS-CoV-2 viral copies this assay can detect is 250  copies / mL. A negative result does not preclude SARS-CoV-2 infection  and should not be used as the sole basis for treatment or other  patient management decisions.  A negative result may occur with  improper specimen collection / handling,  submission of specimen other  than nasopharyngeal swab, presence of viral mutation(s) within the  areas targeted by this assay, and inadequate number of viral copies  (<250 copies / mL). A negative result must be combined with clinical  observations, patient history, and epidemiological information. If result is POSITIVE SARS-CoV-2 target nucleic acids are DETECTED. The SARS-CoV-2 RNA is generally detectable in upper and lower  respiratory specimens dur ing the acute phase of infection.  Positive  results are indicative of active infection with SARS-CoV-2.  Clinical  correlation with patient history and other diagnostic information is  necessary to determine patient infection status.  Positive results do  not rule out bacterial infection or co-infection with other viruses. If result is PRESUMPTIVE POSTIVE SARS-CoV-2 nucleic acids MAY BE PRESENT.   A presumptive positive result was obtained on the submitted specimen  and confirmed on repeat testing.  While 2019 novel coronavirus  (SARS-CoV-2) nucleic acids may be present in the submitted sample  additional confirmatory testing may be necessary for epidemiological  and / or clinical management purposes  to differentiate between  SARS-CoV-2 and other Sarbecovirus currently known to infect humans.  If clinically indicated additional testing with an alternate test  methodology [phone]) is advised. The SARS-CoV-2 RNA is generally  detectable in upper and lower respiratory sp ecimens during the acute  phase of infection. The expected result is Negative. Fact Sheet for Patients:  StrictlyIdeas.no Fact Sheet for Healthcare Providers: BankingDealers.co.za This test is not yet  approved or cleared by the [place] and has been authorized for detection and/or diagnosis of SARS-CoV-2 by FDA under an Emergency Use Authorization (EUA).  This EUA will remain in effect (meaning this test can be  used) for the duration of the COVID-19 declaration under Section 564(b)(1) of the Act, 21 U.S.C. section 360bbb-3(b)(1), unless the authorization is terminated or revoked sooner. Performed at [facility], [address] [place], [place] [address]      Coagulation Studies: Recent Labs    03/27/19 1040  LABPROT 14.9  INR 1.2    Urinalysis: No results for input(s): COLORURINE, LABSPEC, PHURINE, GLUCOSEU, HGBUR, BILIRUBINUR, KETONESUR, PROTEINUR, UROBILINOGEN, NITRITE, LEUKOCYTESUR in the last 72 hours.  Invalid input(s): APPERANCEUR      Imaging: Dg Chest Portable 1 View  Result Date: 03/27/2019 CLINICAL DATA:  Shortness of breath.  Hypoxia. EXAM: PORTABLE CHEST 1 VIEW COMPARISON:  August 27, 2017 FINDINGS: Bilateral patchy infiltrates are identified. Stable cardiomegaly. The hila and mediastinum are normal. No pneumothorax. No other acute abnormalities. IMPRESSION: Bilateral patchy infiltrates may represent multifocal pneumonia versus pulmonary edema. Recommend clinical correlation. Electronically Signed   By: Dorise Bullion III M.D   On: 03/27/2019 12:06      Assessment & Plan: Pt is a 63 y.o. African-American  male with hypertension, BPH, advanced CKD, was admitted on 03/27/2019 with uremia and acute pulmonary edema.   1.  End-stage renal disease 2.  Acute pulmonary edema 3.  Severe hypertension 4.  Lower extremity edema 5.  Severe acidosis. 6.  Anemia, likely of chronic kidney disease 7.  2D echo from September 2019 show moderate concentric left ventricular hypertrophy, LVEF 55 to 54%, grade 1 diastolic dysfunction, severely dilated left atrium  -Discussed with patient regarding need for dialysis.  He states he has been refusing dialysis all along because he knows about 2 people who died over on dialysis.  However, when asked if he would take it if necessary to save his life, he said yes to proceed with it. Nurse witnessed.  -Patient is significantly  uremic with neurotoxicity, volume overload from pulmonary edema and significantly elevated blood pressure, uncontrolled hypertension.  He also has mild hyperkalemia -Plan to admit to stepdown/ICU care -Temporary dialysis catheter requested -Once access is available, we plan to start dialysis today Will also need outpatient discharge planning -Obtain iron studies -Recommend bedside bladder scan in renal ultrasound to rule out obstruction Previous work-up includes SPEP, UPEP in April 2019 that was negative for M spike, but had abnormal kappa to lambda ratio  Case discussed with patient, ER physician Dr. Jacqualine Code, hospitalist Dr. Brett Albino, patient's wife Ms [place] Mcbrien by phone.    LOS: 0 Jaydee Conran 4/26/202012:29 PM  [place], [place]  Note: This note was prepared with Dragon dictation. Any transcription errors are unintentional

## 2019-03-27 NOTE — ED Notes (Signed)
Admitting MD at bedside.

## 2019-03-27 NOTE — ED Notes (Signed)
Attempted to call report. Room not ready at this time. Gave ICU this RN's name and number and will await call back.

## 2019-03-27 NOTE — Progress Notes (Signed)
Pre HD Tx   03/27/19 1600  Vital Signs  Temp (!) 97 F (36.1 C)  Temp Source Oral  Pulse Rate 83  Pulse Rate Source Monitor  Resp (!) 32  BP (!) 162/140  BP Location Right Arm  BP Method Automatic  Patient Position (if appropriate) Lying  Oxygen Therapy  SpO2 100 %  O2 Device HFNC  O2 Flow Rate (L/min) 8 L/min  Pulse Oximetry Type Continuous  Pain Assessment  Pain Scale 0-10  Pain Score 0  Dialysis Weight  Weight 93 kg  Type of Weight Pre-Dialysis  Time-Out for Hemodialysis  What Procedure? HD   Pt Identifiers(min of two) First/Last Name;MRN/Account#  Correct Site? Yes  Correct Side? Yes  Correct Procedure? Yes  Consents Verified? Yes  Rad Studies Available? N/A  Safety Precautions Reviewed? Yes  Engineer, civil (consulting) Number 4  Station Number  (Bedside ICU )  UF/Alarm Test Passed  Conductivity: Meter 13.8  Conductivity: Machine  13.7  pH 37.2  Reverse Osmosis Main  Normal Saline Lot Number [id number]  Dialyzer Lot Number [id number]  Disposable Set Lot Number [id number]  Machine Temperature 98.6 F (37 C)  Musician and Audible Yes  Blood Lines Intact and Secured Yes  Pre Treatment Patient Checks  Vascular access used during treatment Catheter  Hepatitis B Surface Antigen Results  (unknown)  Isolation Initiated Yes  Hepatitis B Surface Antibody  (unknown)  Date Hepatitis B Surface Antibody Drawn 03/27/19  Hemodialysis Consent Verified Yes  Hemodialysis Standing Orders Initiated Yes  ECG (Telemetry) Monitor On Yes  Prime Ordered Normal Saline  Length of  DialysisTreatment -hour(s) 2 Hour(s)  Dialysis Treatment Comments Na 140,New Start  Dialyzer Elisio 17H NR  Dialysate 2K, 2.5 Ca  Dialysis Anticoagulant None  Dialysate Flow Ordered 300  Blood Flow Rate Ordered 200 mL/min  Ultrafiltration Goal 1.5 Liters  Pre Treatment Labs Hepatitis B Surface Antigen  Dialysis Blood Pressure Support Ordered Normal Saline  Education / Care Plan  Dialysis  Education Provided Yes  Documented Education in Care Plan Yes  Hemodialysis Catheter  Placement Date: 03/27/19    Site Condition No complications  Blue Lumen Status Blood return noted  Red Lumen Status Blood return noted  Purple Lumen Status Capped (Central line)  Dressing Type Biopatch;Occlusive  Dressing Status Clean;Dry;Intact  Dressing Change Due 04/02/19

## 2019-03-27 NOTE — Progress Notes (Signed)
Post HD TX : pt did have some cramping post Tx and urine out put of 352m during Tx. Overall Tx tolerated well, UF goal met. Pt is stable and reports better breathing, O2 was reduced to 2 L and pt is sating at 100% on 2L. B/P also improved.    03/27/19 1830  Vital Signs  Temp 97.6 F (36.4 C)  Temp Source Oral  Pulse Rate 80  Pulse Rate Source Monitor  Resp (!) 28  BP (!) 182/54  BP Location Right Arm  BP Method Automatic  Patient Position (if appropriate) Lying  Oxygen Therapy  SpO2 100 %  O2 Device Nasal Cannula  O2 Flow Rate (L/min) 4 L/min  Pulse Oximetry Type Continuous  Pain Assessment  Pain Scale 0-10  Pain Score 0  Dialysis Weight  Weight 91.5 kg  Type of Weight Post-Dialysis  Post-Hemodialysis Assessment  Rinseback Volume (mL) 250 mL  KECN 24.2 V  Dialyzer Clearance Lightly streaked  Duration of HD Treatment -hour(s) 2 hour(s)  Hemodialysis Intake (mL) 500 mL  UF Total -Machine (mL) 2001 mL  Net UF (mL) 1501 mL  Tolerated HD Treatment Yes  Hemodialysis Catheter  Placement Date: 03/27/19    Site Condition No complications  Catheter fill solution Heparin 1000 units/ml  Catheter fill volume (Arterial) 1.8 cc  Catheter fill volume (Venous) 1.8  Post treatment catheter status Capped and Clamped

## 2019-03-27 NOTE — Consult Note (Signed)
Reason for Consult:ESRD Referring Physician: Sela Hua, MD  Devin Harmon is an 63 y.o. male.  HPI: 63 year old male with chronic renal insufficiency and acute exacerbation/pulmonary edema. Was transferred to the ICU for emergent temporary dialysis catheter in the left jugular .  Past Medical History:  Diagnosis Date  . Allergy   . BPH (benign prostatic hypertrophy)   . Hypertension   . Renal insufficiency   . Zoster 2007   facial, hospital 2007    History reviewed. No pertinent surgical history.  Family History  Problem Relation Age of Onset  . Hypertension Mother   . Cancer Mother        lung cancer  . Hypertension Brother   . Heart disease Father   . Coronary artery disease Neg Hx   . Diabetes Neg Hx     Social History:  reports that he has never smoked. He has never used smokeless tobacco. He reports current alcohol use. He reports previous drug use.  Allergies:  Allergies  Allergen Reactions  . Chlorthalidone     REACTION: felt "bad" and ED  . Hydrocodone Other (See Comments)    Nausea and dizzy  . Sulfonamide Derivatives     REACTION: unspecified    Medications: I have reviewed the patient's current medications.  Results for orders placed or performed during the hospital encounter of 03/27/19 (from the past 48 hour(s))  Comprehensive metabolic panel     Status: Abnormal   Collection Time: 03/27/19 10:40 AM  Result Value Ref Range   Sodium 134 (L) 135 - 145 mmol/L   Potassium 5.4 (H) 3.5 - 5.1 mmol/L   Chloride 103 98 - 111 mmol/L   CO2 12 (L) 22 - 32 mmol/L   Glucose, Bld 113 (H) 70 - 99 mg/dL   BUN 145 (H) 8 - 23 mg/dL    Comment: RESULT CONFIRMED BY MANUAL DILUTION.PMF   Creatinine, Ser 18.91 (H) 0.61 - 1.24 mg/dL   Calcium 6.7 (L) 8.9 - 10.3 mg/dL   Total Protein 7.4 6.5 - 8.1 g/dL   Albumin 3.9 3.5 - 5.0 g/dL   AST 10 (L) 15 - 41 U/L   ALT 10 0 - 44 U/L   Alkaline Phosphatase 55 38 - 126 U/L   Total Bilirubin 0.6 0.3 - 1.2 mg/dL   GFR  calc non Af Amer 2 (L) >60 mL/min   GFR calc Af Amer 3 (L) >60 mL/min   Anion gap 19 (H) 5 - 15    Comment: Performed at [facility], [address] [place], [place] [address]  CBC WITH DIFFERENTIAL     Status: Abnormal   Collection Time: 03/27/19 10:40 AM  Result Value Ref Range   WBC 11.0 (H) 4.0 - 10.5 K/uL   RBC 3.00 (L) 4.22 - 5.81 MIL/uL   Hemoglobin 8.0 (L) 13.0 - 17.0 g/dL   HCT 24.6 (L) 39.0 - 52.0 %   MCV 82.0 80.0 - 100.0 fL   MCH 26.7 26.0 - 34.0 pg   MCHC 32.5 30.0 - 36.0 g/dL   RDW 16.5 (H) 11.5 - 15.5 %   Platelets 314 150 - 400 K/uL   nRBC 0.0 0.0 - 0.2 %   Neutrophils Relative % 83 %   Neutro Abs 9.2 (H) 1.7 - 7.7 K/uL   Lymphocytes Relative 10 %   Lymphs Abs 1.1 0.7 - 4.0 K/uL   Monocytes Relative 4 %   Monocytes Absolute 0.5 0.1 - 1.0 K/uL  Eosinophils Relative 1 %   Eosinophils Absolute 0.2 0.0 - 0.5 K/uL   Basophils Relative 1 %   Basophils Absolute 0.1 0.0 - 0.1 K/uL   Immature Granulocytes 1 %   Abs Immature Granulocytes 0.07 0.00 - 0.07 K/uL    Comment: Performed at [facility], [address] [place], [place] [address]  Procalcitonin     Status: None   Collection Time: 03/27/19 10:40 AM  Result Value Ref Range   Procalcitonin 1.89 ng/mL    Comment:        Interpretation: PCT > 0.5 ng/mL and <= 2 ng/mL: Systemic infection (sepsis) is possible, but other conditions are known to elevate PCT as well. (NOTE)       Sepsis PCT Algorithm           Lower Respiratory Tract                                      Infection PCT Algorithm    ----------------------------     ----------------------------         PCT < 0.25 ng/mL                PCT < 0.10 ng/mL         Strongly encourage             Strongly discourage   discontinuation of antibiotics    initiation of antibiotics    ----------------------------     -----------------------------       PCT 0.25 - 0.50 ng/mL            PCT 0.10 - 0.25 ng/mL               OR       >80%  decrease in PCT            Discourage initiation of                                            antibiotics      Encourage discontinuation           of antibiotics    ----------------------------     -----------------------------         PCT >= 0.50 ng/mL              PCT 0.26 - 0.50 ng/mL                AND       <80% decrease in PCT             Encourage initiation of                                             antibiotics       Encourage continuation           of antibiotics    ----------------------------     -----------------------------        PCT >= 0.50 ng/mL                  PCT > 0.50 ng/mL               AND  increase in PCT                  Strongly encourage                                      initiation of antibiotics    Strongly encourage escalation           of antibiotics                                     -----------------------------                                           PCT <= 0.25 ng/mL                                                 OR                                        > 80% decrease in PCT                                     Discontinue / Do not initiate                                             antibiotics Performed at [facility], [address] [place], [place] [address]   Protime-INR     Status: None   Collection Time: 03/27/19 10:40 AM  Result Value Ref Range   Prothrombin Time 14.9 11.4 - 15.2 seconds   INR 1.2 0.8 - 1.2    Comment: (NOTE) INR goal varies based on device and disease states. Performed at [facility], [address] [place], [place] [address]   Fibrin derivatives D-Dimer     Status: Abnormal   Collection Time: 03/27/19 10:40 AM  Result Value Ref Range   Fibrin derivatives D-dimer ([facility]) 2,009.08 (H) 0.00 - 499.00 ng/mL (FEU)    Comment: (NOTE) <> Exclusion of Venous Thromboembolism (VTE) - OUTPATIENT ONLY   (Emergency Department or [place])   0-499 ng/ml (FEU): With a low to intermediate  pretest probability                      for VTE this test result excludes the diagnosis                      of VTE.   >499 ng/ml (FEU) : VTE not excluded; additional work up for VTE is                      required. <> Testing on Inpatients and Evaluation of Disseminated Intravascular   Coagulation (DIC) Reference Range:   0-499 ng/ml (FEU) Performed at [facility],  [place], [place] [address]   Lactate dehydrogenase     Status: Abnormal   Collection Time: 03/27/19 10:40 AM  Result Value Ref Range   LDH 274 (H) 98 - 192 U/L    Comment: Performed at [facility], [address] [place], [place] [address]  Ferritin     Status: Abnormal   Collection Time: 03/27/19 10:40 AM  Result Value Ref Range   Ferritin 517 (H) 24 - 336 ng/mL    Comment: Performed at [facility], [address] [place], [place] [address]  Fibrinogen     Status: Abnormal   Collection Time: 03/27/19 10:40 AM  Result Value Ref Range   Fibrinogen 737 (H) 210 - 475 mg/dL    Comment: Performed at [facility], [address], [place], [place] [address]  Magnesium     Status: None   Collection Time: 03/27/19 10:40 AM  Result Value Ref Range   Magnesium 2.0 1.7 - 2.4 mg/dL    Comment: Performed at [facility] [address] [place], [place] [address]  Lactic acid, plasma     Status: None   Collection Time: 03/27/19 10:43 AM  Result Value Ref Range   Lactic Acid, Venous 0.9 0.5 - 1.9 mmol/L    Comment: Performed at [facility], [address] [place], [place] [address]  Brain natriuretic peptide     Status: Abnormal   Collection Time: 03/27/19 10:43 AM  Result Value Ref Range   B Natriuretic Peptide 3,985.0 (H) 0.0 - 100.0 pg/mL    Comment: Performed at [facility], [address] [place], [place] [address]  Triglycerides     Status: None   Collection Time: 03/27/19 10:43 AM  Result Value Ref Range   Triglycerides 128  <150 mg/dL    Comment: Performed at [facility], [address] [place], [place] [address]  Blood gas, venous ([facility], [facility], [facility])     Status: Abnormal   Collection Time: 03/27/19 10:47 AM  Result Value Ref Range   FIO2 0.28    Delivery systems NASAL CANNULA    pH, Ven 7.17 (LL) 7.250 - 7.430    Comment: CRITICAL RESULT CALLED TO, READ BACK BY AND VERIFIED WITH:  QUALE AT 1110, 03/27/2019, LS    pCO2, Ven 34 (L) 44.0 - 60.0 mmHg   pO2, Ven 71.0 (H) 32.0 - 45.0 mmHg   Bicarbonate 12.4 (L) 20.0 - 28.0 mmol/L   Acid-base deficit 15.1 (H) 0.0 - 2.0 mmol/L   O2 Saturation 88.6 %   Patient temperature 37.0    Collection site VENOUS    Sample type VENOUS     Comment: Performed at [facility], [address] [place], [place] [address]  SARS Coronavirus 2 [facility] order, Performed in [place] hospital lab)     Status: None   Collection Time: 03/27/19 10:55 AM  Result Value Ref Range   SARS Coronavirus 2 NEGATIVE NEGATIVE    Comment: (NOTE) If result is NEGATIVE SARS-CoV-2 target nucleic acids are NOT DETECTED. The SARS-CoV-2 RNA is generally detectable in upper and lower  respiratory specimens during the acute phase of infection. The lowest  concentration of SARS-CoV-2 viral copies this assay can detect is 250  copies / mL. A negative result does not preclude SARS-CoV-2 infection  and should not be used as the sole basis for treatment or other  patient management decisions.  A negative result may occur with  improper specimen  collection / handling, submission of specimen other  than nasopharyngeal swab, presence of viral mutation(s) within the  areas targeted by this assay, and inadequate number of viral copies  (<250 copies / mL). A negative result must be combined with clinical  observations, patient history, and epidemiological information. If result is POSITIVE SARS-CoV-2 target nucleic acids are DETECTED. The SARS-CoV-2 RNA is generally detectable in upper and  lower  respiratory specimens dur ing the acute phase of infection.  Positive  results are indicative of active infection with SARS-CoV-2.  Clinical  correlation with patient history and other diagnostic information is  necessary to determine patient infection status.  Positive results do  not rule out bacterial infection or co-infection with other viruses. If result is PRESUMPTIVE POSTIVE SARS-CoV-2 nucleic acids MAY BE PRESENT.   A presumptive positive result was obtained on the submitted specimen  and confirmed on repeat testing.  While 2019 novel coronavirus  (SARS-CoV-2) nucleic acids may be present in the submitted sample  additional confirmatory testing may be necessary for epidemiological  and / or clinical management purposes  to differentiate between  SARS-CoV-2 and other Sarbecovirus currently known to infect humans.  If clinically indicated additional testing with an alternate test  methodology [phone]) is advised. The SARS-CoV-2 RNA is generally  detectable in upper and lower respiratory sp ecimens during the acute  phase of infection. The expected result is Negative. Fact Sheet for Patients:  StrictlyIdeas.no Fact Sheet for Healthcare Providers: BankingDealers.co.za This test is not yet approved or cleared by the [place] FDA and has been authorized for detection and/or diagnosis of SARS-CoV-2 by FDA under an Emergency Use Authorization (EUA).  This EUA will remain in effect (meaning this test can be used) for the duration of the COVID-19 declaration under Section 564(b)(1) of the Act, 21 U.S.C. section 360bbb-3(b)(1), unless the authorization is terminated or revoked sooner. Performed at [facility], [address] [place], [place] [address]   Glucose, capillary     Status: Abnormal   Collection Time: 03/27/19  2:35 PM  Result Value Ref Range   Glucose-Capillary 101 (H) 70 - 99 mg/dL  MRSA PCR  Screening     Status: None   Collection Time: 03/27/19  2:41 PM  Result Value Ref Range   MRSA by PCR NEGATIVE NEGATIVE    Comment:        The GeneXpert MRSA Assay (FDA approved for NASAL specimens only), is one component of a comprehensive MRSA colonization surveillance program. It is not intended to diagnose MRSA infection nor to guide or monitor treatment for MRSA infections. Performed at [facility], [address] [place], [place] [address]     Dg Chest Port 1 View  Result Date: 03/27/2019 CLINICAL DATA:  Central line placement EXAM: PORTABLE CHEST 1 VIEW COMPARISON:  03/27/2019, 08/27/2018 FINDINGS: Left-sided central venous catheter tip projects over the intra hepatic IVC. No left pneumothorax. Small bilateral pleural effusions. Slight worsening of bilateral interstitial and alveolar extensive airspace opacities. Stable cardiomediastinal silhouette with aortic atherosclerosis. IMPRESSION: 1. Left-sided central venous catheter tip overlies the intra hepatic IVC. No left pneumothorax. 2. Small pleural effusions. Slight worsening of extensive interstitial and alveolar opacities bilaterally. Electronically Signed   By: Donavan Foil M.D.   On: 03/27/2019 15:48   Dg Chest Portable 1 View  Result Date: 03/27/2019 CLINICAL DATA:  Shortness of breath.  Hypoxia. EXAM: PORTABLE CHEST 1 VIEW COMPARISON:  August 27, 2017 FINDINGS: Bilateral patchy infiltrates are identified. Stable cardiomegaly. The  hila and mediastinum are normal. No pneumothorax. No other acute abnormalities. IMPRESSION: Bilateral patchy infiltrates may represent multifocal pneumonia versus pulmonary edema. Recommend clinical correlation. Electronically Signed   By: Dorise Bullion III M.D   On: 03/27/2019 12:06    Review of Systems  All other systems reviewed and are negative.  Blood pressure (!) 182/54, pulse 80, temperature 97.6 F (36.4 C), temperature source Oral, resp. rate (!) 28, height 5\' 9"  (1.753  m), weight 91.5 kg, SpO2 100 %. Physical Exam  Constitutional: He is oriented to person, place, and time. Vital signs are normal. He appears well-developed and well-nourished. He has a sickly appearance. He appears distressed.  HENT:  Head: Normocephalic and atraumatic.  Eyes: Pupils are equal, round, and reactive to light. Conjunctivae, EOM and lids are normal. Lids are everted and swept, no foreign bodies found.  Neck: Trachea normal and normal range of motion. Neck supple.    Cardiovascular: Normal rate, regular rhythm, S1 normal, S2 normal and normal heart sounds.  Respiratory: Effort normal. He has rales.  GI: Soft. Normal appearance and normal aorta.  Musculoskeletal: Normal range of motion.  Neurological: He is alert and oriented to person, place, and time.  Skin: Skin is warm, dry and intact.  Psychiatric: He has a normal mood and affect.      Assessment/Plan: This is a 63 year old male with ESRD.   He will need upper extremity arterial and venous studies for AVF planning once he is out of the ICU and controlled.  Elmore Guise 03/27/2019, 7:20 PM

## 2019-03-27 NOTE — Procedures (Signed)
Central Venous Dailysis Catheter Placement: TRIPLE LUMEN  Indication: Hemo Dialysis/CRRT   Consent:emergent   Hand washing performed prior to starting the procedure.   Procedure: An active timeout was performed and correct patient, name, & ID confirmed. Patient was positioned correctly for central venous access. Patient was prepped using strict sterile technique including chlorohexadine preps, sterile drape, sterile gown and sterile gloves.  The area was prepped, draped and anesthetized in the usual sterile manner. Patient comfort was obtained.    A Double lumen catheter was placed in LEFT IJ  Vein There was good blood return, catheter caps were placed on lumens, catheter flushed easily, the line was secured and a sterile dressing and BIO-PATCH applied.   Ultrasound was used to visualize vasculature and guidance of needle.   Number of Attempts: 1 Complications:none  Estimated Blood Loss: none Operator: Jarika Robben.   Thaddaeus Granja David Glendal Cassaday, M.D.   Pulmonary & Critical Care Medicine  Medical Director ICU-[facility] [place] Medical Director [facility] Cardio-Pulmonary Department     

## 2019-03-27 NOTE — ED Triage Notes (Signed)
Pt arrives via [facility] from home- pt has a history of CKD and refuses dialysis- pt has tremors and typically PO bicarb helps but this time it did not- Pt has some wheezing and SHOB- arrived on 4L [place] and pt stated that helped his [facility]- also having some AMS that EMS descibed as "odd questions and answer"  Pt currently having tremors and foaming at mouth

## 2019-03-27 NOTE — ED Notes (Signed)
Called respiratory for high flow [place]

## 2019-03-27 NOTE — ED Provider Notes (Signed)
[facility] Emergency Department Provider Note   ____________________________________________   First MD Initiated Contact with Patient 03/27/19 1040     (approximate)  I have reviewed the triage vital signs and the nursing notes.   HISTORY  Chief Complaint Shortness of Breath and Tremors    HPI Devin Harmon is a 63 y.o. male who presents for evaluation for shortness of breath.  Patient reports he had increasing shortness of breath for about 4 days, much worse when he tries to lay back.  Feels like he just cannot get air in.  He is not having pain no fevers no chills.  He is not really coughing symptoms a little bit.  Has not been around anyone with known coronavirus  He tells me that he is also noticed some swelling in both ankles.  He continues to have a strange tremor in both hands that he [place] cannot seem to get rid of but had a few months ago when he was at the ER, they told him he needed dialysis but he did not want  Patient denies being in pain.  Reports he just feels short of breath.  He knows his kidneys are bad but he still makes a little bit of urine.   Past Medical History:  Diagnosis Date   Allergy    BPH (benign prostatic hypertrophy)    Hypertension    Renal insufficiency    Zoster 2007   facial, hospital 2007    Patient Active Problem List   Diagnosis Date Noted   Allergic rhinitis due to pollen 02/10/2019   Chronic renal failure, stage 5 ([place]) 08/28/2018   Hematoma of abdominal wall 10/07/2017   Routine general medical examination at a health care facility 06/20/2011   Malignant hypertension 02/28/2010   BPH without obstruction/lower urinary tract symptoms 01/04/2008   Essential hypertension, benign 08/19/2007    History reviewed. No pertinent surgical history.  Prior to Admission medications   Medication Sig Start Date End Date Taking? Authorizing Provider  acetaminophen (TYLENOL) 500 MG tablet Take  500-1,000 mg by mouth every 6 (six) hours as needed for mild pain.    [provider]  amLODipine (NORVASC) 10 MG tablet Take 1 tablet (10 mg total) by mouth daily. 10/13/18   Venia Carbon, MD  cetirizine (ZYRTEC) 10 MG tablet Take 10 mg by mouth daily.    [provider]  labetalol (NORMODYNE) 100 MG tablet Take 1 tablet (100 mg total) by mouth 2 (two) times daily. 10/13/18   Venia Carbon, MD  montelukast (SINGULAIR) 10 MG tablet Take 1 tablet (10 mg total) by mouth at bedtime. 02/10/19   Venia Carbon, MD  Multiple Vitamin (MULTIVITAMIN WITH MINERALS) TABS tablet Take 1 tablet by mouth daily. 09/02/18   Samuella Cota, MD  sodium bicarbonate 650 MG tablet Take 1 tablet (650 mg total) by mouth 3 (three) times daily. 10/08/18   Venia Carbon, MD  terazosin (HYTRIN) 10 MG capsule Take 1 capsule (10 mg total) by mouth at bedtime. 10/22/18   Venia Carbon, MD  thiamine 100 MG tablet Take 1 tablet (100 mg total) by mouth daily. 09/02/18   Samuella Cota, MD    Allergies Chlorthalidone; Hydrocodone; and Sulfonamide derivatives  Family History  Problem Relation Age of Onset   Hypertension Mother    Cancer Mother        lung cancer   Hypertension Brother    Heart disease Father  Coronary artery disease Neg Hx    Diabetes Neg Hx     Social History Social History   Tobacco Use   Smoking status: Never Smoker   Smokeless tobacco: Never Used  Substance Use Topics   Alcohol use: Yes    Comment: occasional   Drug use: Not Currently    Review of Systems Constitutional: No fever/chills Eyes: No visual changes. ENT: No sore throat. Cardiovascular: Denies chest pain. Respiratory: The HPI Gastrointestinal: No abdominal pain.   Genitourinary: Negative for dysuria. Musculoskeletal: Negative for back pain.  Little bit of swelling both feet Skin: Negative for rash. Neurological: Negative for headaches, areas of focal weakness or  numbness.    ____________________________________________   PHYSICAL EXAM:  VITAL SIGNS: ED Triage Vitals  Enc Vitals Group     BP 03/27/19 1105 (!) 172/96     Pulse Rate 03/27/19 1045 79     Resp 03/27/19 1045 (!) 32     Temp 03/27/19 1109 97.8 F (36.6 C)     Temp Source 03/27/19 1109 Oral     SpO2 03/27/19 1045 100 %     Weight 03/27/19 1041 205 lb (93 kg)     Height 03/27/19 1041 5\' 9"  (1.753 m)     Head Circumference --      Peak Flow --      Pain Score 03/27/19 1039 0     Pain Loc --      Pain Edu? --      Excl. in [place]? --     Constitutional: Alert and oriented.  He is ill-appearing, seated upright modestly short of breath.  Able to speak in 1-2 word sentences. Eyes: Conjunctivae are normal. Head: Atraumatic. Nose: No congestion/rhinnorhea. Mouth/Throat: Mucous membranes are moist. Neck: No stridor.  Cardiovascular: Normal rate, regular rhythm. Grossly normal heart sounds.  Good peripheral circulation. Respiratory: Moderate increased work of breathing.  Some mild accessory muscle use.  He has diffuse rhonchorous and wet crackles throughout the lungs bilaterally, more so in the bases.  No wheezing. Gastrointestinal: Soft and nontender. No distention. Musculoskeletal: No lower extremity tenderness but does have bilateral equal lower extremity edema. Neurologic:  Normal speech and language. No gross focal neurologic deficits are appreciated.  Skin:  Skin is warm, dry and intact. No rash noted. Psychiatric: Mood and affect are normal. Speech and behavior are normal.  ____________________________________________   LABS (all labs ordered are listed, but only abnormal results are displayed)  Labs Reviewed  COMPREHENSIVE METABOLIC PANEL - Abnormal; Notable for the following components:      Result Value   Sodium 134 (*)    Potassium 5.4 (*)    CO2 12 (*)    Glucose, Bld 113 (*)    BUN 145 (*)    Creatinine, Ser 18.91 (*)    Calcium 6.7 (*)    AST 10 (*)    GFR  calc non Af Amer 2 (*)    GFR calc Af Amer 3 (*)    Anion gap 19 (*)    All other components within normal limits  BRAIN NATRIURETIC PEPTIDE - Abnormal; Notable for the following components:   B Natriuretic Peptide 3,985.0 (*)    All other components within normal limits  CBC WITH DIFFERENTIAL/PLATELET - Abnormal; Notable for the following components:   WBC 11.0 (*)    RBC 3.00 (*)    Hemoglobin 8.0 (*)    HCT 24.6 (*)    RDW 16.5 (*)    Neutro Abs 9.2 (*)  All other components within normal limits  BLOOD GAS, VENOUS - Abnormal; Notable for the following components:   pH, Ven 7.17 (*)    pCO2, Ven 34 (*)    pO2, Ven 71.0 (*)    Bicarbonate 12.4 (*)    Acid-base deficit 15.1 (*)    All other components within normal limits  FIBRIN DERIVATIVES D-DIMER ([facility] ONLY) - Abnormal; Notable for the following components:   Fibrin derivatives D-dimer [facility]) 2,009.08 (*)    All other components within normal limits  LACTATE DEHYDROGENASE - Abnormal; Notable for the following components:   LDH 274 (*)    All other components within normal limits  FERRITIN - Abnormal; Notable for the following components:   Ferritin 517 (*)    All other components within normal limits  FIBRINOGEN - Abnormal; Notable for the following components:   Fibrinogen 737 (*)    All other components within normal limits  SARS CORONAVIRUS 2 (HOSPITAL ORDER, [place] [facility])  CULTURE, BLOOD (ROUTINE X 2)  CULTURE, BLOOD (ROUTINE X 2)  LACTIC ACID, PLASMA  PROCALCITONIN  PROTIME-INR  TRIGLYCERIDES  MAGNESIUM  C-REACTIVE PROTEIN   ____________________________________________  EKG  Reviewed and interpreted by me at 1040 Heart rate 80 PR 170 QRS 100 QTc 490 Left ventricular hypertrophy, nonspecific T wave abnormality, likely related to LVH.  No evidence of acute ischemia.  Mild prolongation of the QT interval ____________________________________________  RADIOLOGY  Dg Chest Portable  1 View  Result Date: 03/27/2019 CLINICAL DATA:  Shortness of breath.  Hypoxia. EXAM: PORTABLE CHEST 1 VIEW COMPARISON:  August 27, 2017 FINDINGS: Bilateral patchy infiltrates are identified. Stable cardiomegaly. The hila and mediastinum are normal. No pneumothorax. No other acute abnormalities. IMPRESSION: Bilateral patchy infiltrates may represent multifocal pneumonia versus pulmonary edema. Recommend clinical correlation. Electronically Signed   By: Dorise Bullion III M.D   On: 03/27/2019 12:06     In this clinical context I suspect this is acute pulmonary edema ____________________________________________   PROCEDURES  Procedure(s) performed: None  Procedures  Critical Care performed: Yes, see critical care note(s)  CRITICAL CARE Performed by: Delman Kitten   Total critical care time: 40 minutes  Critical care time was exclusive of separately billable procedures and treating other patients.  Critical care was necessary to treat or prevent imminent or life-threatening deterioration.  Critical care was time spent personally by me on the following activities: development of treatment plan with patient and/or surrogate as well as nursing, discussions with consultants, evaluation of patient's response to treatment, examination of patient, obtaining history from patient or surrogate, ordering and performing treatments and interventions, ordering and review of laboratory studies, ordering and review of radiographic studies, pulse oximetry and re-evaluation of patient's condition.  ____________________________________________   INITIAL IMPRESSION / ASSESSMENT AND PLAN / ED COURSE  Pertinent labs & imaging results that were available during my care of the patient were reviewed by me and considered in my medical decision making (see chart for details).   Clinical evaluation highly concerning for flash pulmonary edema, volume overload.  Denies infectious symptoms.  Denies chest  pain.  Clinical Course as of Mar 26 1234  Sun Mar 27, 2019  1055 Discussed with his wife, [place].    [MQ]  7939 Patient appears acutely volume overloaded.  In respiratory distress.  I discussed with the patient and he reports that he would not want to be on a ventilator and not want to receive CPR even if a life saving.  I  will make him DO NOT RESUSCITATE.  I confirmed this and discussed with his wife [place], she reports that her understanding is that the patient also would not want to be on a ventilator or receive CPR even if it was lifesaving.  Both the patient and his wife confirm no ventilator, no CPR and thus I will make the patient DNR.  The patient clearly understands that even if it would save his life, he doesn't want a ventilator or CPR>    [MQ]  1205 Consult placed with Dr. Candiss Norse of nephrology.   [MQ]  1218 Dr. Candiss Norse at bedside speaking with patient now about his condition and need for dialysis.    [MQ]    Clinical Course User Index [MQ] Delman Kitten, MD   ----------------------------------------- 12:00 PM on 03/27/2019 -----------------------------------------  Patient is having occasional PVCs, occasional slight ectopy.  His lab work is highly concerning, lab has not completely processed his BMP but reports a creatinine of 18.  Patient has refused dialysis, I have paged nephrology to come and speak to him as this is clearly of life-threatening concern but he as stated to his wife, physicians in the past, and myself that he is not interested in dialysis but wants medical treatment which I have described to him is probably not possible to actually save his life, what he really needs his dialysis.  As we await his potassium and electrolytes to return, I have ordered a amp of bicarbonate and calcium gluconate in the interim.  He does not have a sinusoidal waveform.  Additionally, the patient is very clear that he is DO NOT RESUSCITATE and his wife has confirmed this.  His respirations  have improved on high flow nasal cannula, I am hesitant to initiate BiPAP because of coronavirus and also the patient was unable to tolerate nonrebreather with EMS.  At the moment he is slowly showing some improvement with his work of breathing, but he is obviously critically ill and this is a very difficult predicament with the patient refusing hemodialysis, I have consulted nephrology in hopes that they can speak to the patient as well  ----------------------------------------- 12:29 PM on 03/27/2019 -----------------------------------------  Dr. Candiss Norse of nephrology spoke with the patient, after his discussion the patient is accepting of starting dialysis now.  Patient will be admitted to the hospital, will need access placed which is available through critical care medicine physician per Dr. Candiss Norse.  Dr. Candiss Norse agrees with Nitropaste and IV Lasix, patient to be admitted.  Admission discussed with Dr. Brett Albino ____________________________________________   FINAL CLINICAL IMPRESSION(S) / ED DIAGNOSES  Final diagnoses:  ESRD (end stage renal disease) ([place])  Acute pulmonary edema ([place])        Note:  This document was prepared using Dragon voice recognition software and may include unintentional dictation errors       Delman Kitten, MD 03/27/19 1236

## 2019-03-27 NOTE — Progress Notes (Signed)
Pt has remained alert and oriented with confusion to situation. Pt has transitioned from HFNC 8L to 2LNC, SpO2 >95%. Lung sounds clear/diminished to auscultation. Pt has remained in NSR on cardiac monitor. BP hypertensive, HR WNL. Pt has tolerated dialysis with a 1.5L removal. Wife ([place]) has been updated via phone.

## 2019-03-27 NOTE — Progress Notes (Signed)
Pre HD Assessment    03/27/19 1600  Neurological  Level of Consciousness Alert  Orientation Level Oriented to person;Oriented to situation  Respiratory  Respiratory Pattern Tachypnea  Chest Assessment Chest expansion symmetrical  Bilateral Breath Sounds Fine crackles  Cough Non-productive  Cardiac  Pulse Regular  Heart Sounds S1, S2;S3  ECG Monitor Yes  Cardiac Rhythm NSR  Vascular  R Radial Pulse +2  L Radial Pulse +2  Edema Generalized  Generalized Edema +2  Psychosocial  Psychosocial (WDL) WDL  Patient Behaviors Anxious;Cooperative;Appropriate for situation

## 2019-03-27 NOTE — ED Notes (Signed)
Pt wanted this RN to call wife and inform her of admission.

## 2019-03-27 NOTE — Progress Notes (Signed)
HD Tx completed    03/27/19 1815  Vital Signs  Pulse Rate 87  Pulse Rate Source Monitor  Resp (!) 34  BP (!) 173/116  BP Location Right Arm  BP Method Automatic  Patient Position (if appropriate) Lying  Oxygen Therapy  SpO2 100 %  O2 Device Nasal Cannula;HFNC  O2 Flow Rate (L/min) 4 L/min  Pulse Oximetry Type Continuous  During Hemodialysis Assessment  Blood Flow Rate (mL/min) 200 mL/min  Arterial Pressure (mmHg) -90 mmHg  Venous Pressure (mmHg) 50 mmHg  Transmembrane Pressure (mmHg) 40 mmHg  Ultrafiltration Rate (mL/min) 1000 mL/min  Dialysate Flow Rate (mL/min) 300 ml/min  Conductivity: Machine  13.6  HD Safety Checks Performed Yes  KECN 24.2 KECN  Dialysis Fluid Bolus Normal Saline  Bolus Amount (mL) 250 mL  Intra-Hemodialysis Comments Tx completed;Tolerated well

## 2019-03-27 NOTE — ED Notes (Signed)
ED TO INPATIENT HANDOFF REPORT  ED Nurse Name and Phone #:   S Name/Age/Gender Devin Harmon 63 y.o. male Room/Bed: ED06A/ED06A  Code Status   Code Status: Prior  Home/SNF/Other Home Patient oriented to: self, place, time and situation Is this baseline? Yes   Triage Complete: Triage complete  Chief Complaint AMS/Tremors  Triage Note Pt arrives via [facility] from home- pt has a history of CKD and refuses dialysis- pt has tremors and typically PO bicarb helps but this time it did not- Pt has some wheezing and SHOB- arrived on 4L [place] and pt stated that helped his [facility]- also having some AMS that EMS descibed as "odd questions and answer"  Pt currently having tremors and foaming at mouth   Allergies Allergies  Allergen Reactions  . Chlorthalidone     REACTION: felt "bad" and ED  . Hydrocodone Other (See Comments)    Nausea and dizzy  . Sulfonamide Derivatives     REACTION: unspecified    Level of Care/Admitting Diagnosis ED Disposition    ED Disposition Condition [place] [facility]: [place] [[id number]]  Level of Care: Stepdown [14]  Covid Evaluation: N/A  Diagnosis: Acute respiratory failure with hypoxia [facility]) [[id number]]  Admitting Physician: Hyman Bible DODD [[id number]]  Attending Physician: Hyman Bible DODD [[id number]]  Estimated length of stay: past midnight tomorrow  Certification:: I certify this patient will need inpatient services for at least 2 midnights  PT Class (Do Not Modify): Inpatient [101]  PT Acc Code (Do Not Modify): Private [1]       B Medical/Surgery History Past Medical History:  Diagnosis Date  . Allergy   . BPH (benign prostatic hypertrophy)   . Hypertension   . Renal insufficiency   . Zoster 2007   facial, hospital 2007   History reviewed. No pertinent surgical history.   A IV Location/Drains/Wounds Patient Lines/Drains/Airways Status   Active Line/Drains/Airways    Name:   Placement date:   Placement  time:   Site:   Days:   Peripheral IV 03/27/19 Left Antecubital   03/27/19    1117    Antecubital   less than 1   Peripheral IV 03/27/19 Left Forearm   03/27/19    1117    Forearm   less than 1   Peripheral IV 03/27/19 Left Hand   03/27/19    1226    Hand   less than 1   External Urinary Catheter   08/31/18    1138    -   208          Intake/Output Last 24 hours No intake or output data in the 24 hours ending 03/27/19 1312  Labs/Imaging Results for orders placed or performed during the hospital encounter of 03/27/19 (from the past 48 hour(s))  Comprehensive metabolic panel     Status: Abnormal   Collection Time: 03/27/19 10:40 AM  Result Value Ref Range   Sodium 134 (L) 135 - 145 mmol/L   Potassium 5.4 (H) 3.5 - 5.1 mmol/L   Chloride 103 98 - 111 mmol/L   CO2 12 (L) 22 - 32 mmol/L   Glucose, Bld 113 (H) 70 - 99 mg/dL   BUN 145 (H) 8 - 23 mg/dL    Comment: RESULT CONFIRMED BY MANUAL DILUTION.PMF   Creatinine, Ser 18.91 (H) 0.61 - 1.24 mg/dL   Calcium 6.7 (L) 8.9 - 10.3 mg/dL   Total Protein 7.4 6.5 - 8.1 g/dL   Albumin 3.9  3.5 - 5.0 g/dL   AST 10 (L) 15 - 41 U/L   ALT 10 0 - 44 U/L   Alkaline Phosphatase 55 38 - 126 U/L   Total Bilirubin 0.6 0.3 - 1.2 mg/dL   GFR calc non Af Amer 2 (L) >60 mL/min   GFR calc Af Amer 3 (L) >60 mL/min   Anion gap 19 (H) 5 - 15    Comment: Performed at [facility], [address] [place], [place] [address]  CBC WITH DIFFERENTIAL     Status: Abnormal   Collection Time: 03/27/19 10:40 AM  Result Value Ref Range   WBC 11.0 (H) 4.0 - 10.5 K/uL   RBC 3.00 (L) 4.22 - 5.81 MIL/uL   Hemoglobin 8.0 (L) 13.0 - 17.0 g/dL   HCT 24.6 (L) 39.0 - 52.0 %   MCV 82.0 80.0 - 100.0 fL   MCH 26.7 26.0 - 34.0 pg   MCHC 32.5 30.0 - 36.0 g/dL   RDW 16.5 (H) 11.5 - 15.5 %   Platelets 314 150 - 400 K/uL   nRBC 0.0 0.0 - 0.2 %   Neutrophils Relative % 83 %   Neutro Abs 9.2 (H) 1.7 - 7.7 K/uL   Lymphocytes Relative 10 %   Lymphs Abs 1.1 0.7 - 4.0 K/uL    Monocytes Relative 4 %   Monocytes Absolute 0.5 0.1 - 1.0 K/uL   Eosinophils Relative 1 %   Eosinophils Absolute 0.2 0.0 - 0.5 K/uL   Basophils Relative 1 %   Basophils Absolute 0.1 0.0 - 0.1 K/uL   Immature Granulocytes 1 %   Abs Immature Granulocytes 0.07 0.00 - 0.07 K/uL    Comment: Performed at [facility], [address] [place], [place] [address]  Procalcitonin     Status: None   Collection Time: 03/27/19 10:40 AM  Result Value Ref Range   Procalcitonin 1.89 ng/mL    Comment:        Interpretation: PCT > 0.5 ng/mL and <= 2 ng/mL: Systemic infection (sepsis) is possible, but other conditions are known to elevate PCT as well. (NOTE)       Sepsis PCT Algorithm           Lower Respiratory Tract                                      Infection PCT Algorithm    ----------------------------     ----------------------------         PCT < 0.25 ng/mL                PCT < 0.10 ng/mL         Strongly encourage             Strongly discourage   discontinuation of antibiotics    initiation of antibiotics    ----------------------------     -----------------------------       PCT 0.25 - 0.50 ng/mL            PCT 0.10 - 0.25 ng/mL               OR       >80% decrease in PCT            Discourage initiation of  antibiotics      Encourage discontinuation           of antibiotics    ----------------------------     -----------------------------         PCT >= 0.50 ng/mL              PCT 0.26 - 0.50 ng/mL                AND       <80% decrease in PCT             Encourage initiation of                                             antibiotics       Encourage continuation           of antibiotics    ----------------------------     -----------------------------        PCT >= 0.50 ng/mL                  PCT > 0.50 ng/mL               AND         increase in PCT                  Strongly encourage                                       initiation of antibiotics    Strongly encourage escalation           of antibiotics                                     -----------------------------                                           PCT <= 0.25 ng/mL                                                 OR                                        > 80% decrease in PCT                                     Discontinue / Do not initiate                                             antibiotics Performed at [facility], [address], [place], [place] [address]   Protime-INR     Status: None   Collection Time: 03/27/19 10:40 AM  Result Value Ref Range   Prothrombin Time 14.9 11.4 - 15.2 seconds   INR 1.2 0.8 - 1.2    Comment: (NOTE) INR goal varies based on device and disease states. Performed at [facility], [address] [place], [place] [address]   Fibrin derivatives D-Dimer     Status: Abnormal   Collection Time: 03/27/19 10:40 AM  Result Value Ref Range   Fibrin derivatives D-dimer ([facility]) 2,009.08 (H) 0.00 - 499.00 ng/mL (FEU)    Comment: (NOTE) <> Exclusion of Venous Thromboembolism (VTE) - OUTPATIENT ONLY   (Emergency Department or [place])   0-499 ng/ml (FEU): With a low to intermediate pretest probability                      for VTE this test result excludes the diagnosis                      of VTE.   >499 ng/ml (FEU) : VTE not excluded; additional work up for VTE is                      required. <> Testing on Inpatients and Evaluation of Disseminated Intravascular   Coagulation (DIC) Reference Range:   0-499 ng/ml (FEU) Performed at [facility], [address] [place], [place] [address]   Lactate dehydrogenase     Status: Abnormal   Collection Time: 03/27/19 10:40 AM  Result Value Ref Range   LDH 274 (H) 98 - 192 U/L    Comment: Performed at [facility], [address] [place], [place] [address]  Ferritin     Status: Abnormal   Collection Time: 03/27/19 10:40 AM   Result Value Ref Range   Ferritin 517 (H) 24 - 336 ng/mL    Comment: Performed at [facility], [address] [place], [place] [address]  Fibrinogen     Status: Abnormal   Collection Time: 03/27/19 10:40 AM  Result Value Ref Range   Fibrinogen 737 (H) 210 - 475 mg/dL    Comment: Performed at [facility], [address], [place], [place] [address]  Magnesium     Status: None   Collection Time: 03/27/19 10:40 AM  Result Value Ref Range   Magnesium 2.0 1.7 - 2.4 mg/dL    Comment: Performed at [facility], [address] [place], [place] [address]  Lactic acid, plasma     Status: None   Collection Time: 03/27/19 10:43 AM  Result Value Ref Range   Lactic Acid, Venous 0.9 0.5 - 1.9 mmol/L    Comment: Performed at [facility], [address] [place], [place] [address]  Brain natriuretic peptide     Status: Abnormal   Collection Time: 03/27/19 10:43 AM  Result Value Ref Range   B Natriuretic Peptide 3,985.0 (H) 0.0 - 100.0 pg/mL    Comment: Performed at [facility], [address] [place], [place] [address]  Triglycerides     Status: None   Collection Time: 03/27/19 10:43 AM  Result Value Ref Range   Triglycerides 128 <150 mg/dL    Comment: Performed at [facility], [address] [place],  [address]  Blood gas, venous ([facility], [facility], [facility])     Status: Abnormal   Collection Time: 03/27/19 10:47 AM  Result Value Ref Range   FIO2 0.28    Delivery systems NASAL CANNULA    pH, Ven 7.17 (LL) 7.250 -  7.430    Comment: CRITICAL RESULT CALLED TO, READ BACK BY AND VERIFIED WITH:  QUALE AT 1110, 03/27/2019, LS    pCO2, Ven 34 (L) 44.0 - 60.0 mmHg   pO2, Ven 71.0 (H) 32.0 - 45.0 mmHg   Bicarbonate 12.4 (L) 20.0 - 28.0 mmol/L   Acid-base deficit 15.1 (H) 0.0 - 2.0 mmol/L   O2 Saturation 88.6 %   Patient temperature 37.0    Collection site VENOUS    Sample type VENOUS     Comment: Performed at [facility], [address] [place], [place] [address]  SARS Coronavirus 2 [facility] order, Performed in [place] hospital lab)     Status: None   Collection Time: 03/27/19 10:55 AM  Result Value Ref Range   SARS Coronavirus 2 NEGATIVE NEGATIVE    Comment: (NOTE) If result is NEGATIVE SARS-CoV-2 target nucleic acids are NOT DETECTED. The SARS-CoV-2 RNA is generally detectable in upper and lower  respiratory specimens during the acute phase of infection. The lowest  concentration of SARS-CoV-2 viral copies this assay can detect is 250  copies / mL. A negative result does not preclude SARS-CoV-2 infection  and should not be used as the sole basis for treatment or other  patient management decisions.  A negative result may occur with  improper specimen collection / handling, submission of specimen other  than nasopharyngeal swab, presence of viral mutation(s) within the  areas targeted by this assay, and inadequate number of viral copies  (<250 copies / mL). A negative result must be combined with clinical  observations, patient history, and epidemiological information. If result is POSITIVE SARS-CoV-2 target nucleic acids are DETECTED. The SARS-CoV-2 RNA is generally detectable in upper and lower  respiratory specimens dur ing the acute phase of infection.  Positive  results are indicative of active infection with SARS-CoV-2.  Clinical  correlation with patient history and other diagnostic information is  necessary to determine patient infection status.  Positive results do  not rule out bacterial infection or co-infection with other viruses. If result is PRESUMPTIVE POSTIVE SARS-CoV-2 nucleic acids MAY BE PRESENT.   A presumptive positive result was obtained on the submitted specimen  and confirmed on repeat testing.  While 2019 novel coronavirus  (SARS-CoV-2) nucleic acids may be present in the submitted sample  additional confirmatory testing may be necessary for epidemiological  and / or  clinical management purposes  to differentiate between  SARS-CoV-2 and other Sarbecovirus currently known to infect humans.  If clinically indicated additional testing with an alternate test  methodology [phone]) is advised. The SARS-CoV-2 RNA is generally  detectable in upper and lower respiratory sp ecimens during the acute  phase of infection. The expected result is Negative. Fact Sheet for Patients:  StrictlyIdeas.no Fact Sheet for Healthcare Providers: BankingDealers.co.za This test is not yet approved or cleared by the [place] FDA and has been authorized for detection and/or diagnosis of SARS-CoV-2 by FDA under an Emergency Use Authorization (EUA).  This EUA will remain in effect (meaning this test can be used) for the duration of the COVID-19 declaration under Section 564(b)(1) of the Act, 21 U.S.C. section 360bbb-3(b)(1), unless the authorization is terminated or revoked sooner. Performed at [facility], [address] [place], [place] [address]    Dg Chest Portable 1 View  Result Date: 03/27/2019 CLINICAL DATA:  Shortness of breath.  Hypoxia. EXAM: PORTABLE CHEST 1 VIEW COMPARISON:  August 27, 2017 FINDINGS: Bilateral patchy infiltrates are identified. Stable cardiomegaly.  The hila and mediastinum are normal. No pneumothorax. No other acute abnormalities. IMPRESSION: Bilateral patchy infiltrates may represent multifocal pneumonia versus pulmonary edema. Recommend clinical correlation. Electronically Signed   By: Dorise Bullion III M.D   On: 03/27/2019 12:06    Pending Labs Unresulted Labs (From admission, onward)    Start     Ordered   03/27/19 1117  C-reactive protein  Add-on,   AD     03/27/19 1116   03/27/19 1043  Blood Culture (routine x 2)  BLOOD CULTURE X 2,   STAT     03/27/19 1043   Signed and Held  Basic metabolic panel  Tomorrow morning,   R     Signed and Held   Signed and Held  CBC  Tomorrow  morning,   R     Signed and Held          Vitals/Pain Today's Vitals   03/27/19 1215 03/27/19 1230 03/27/19 1237 03/27/19 1300  BP:   (!) 169/105 (!) 189/79  Pulse: 83 87 76 (!) 107  Resp: (!) 26 (!) 34 (!) 28 (!) 40  Temp:      TempSrc:      SpO2: 97% 100% 100% 100%  Weight:      Height:      PainSc:        Isolation Precautions No active isolations  Medications Medications  calcium gluconate 10 % injection (  Canceled Entry 03/27/19 1215)  calcium gluconate 1 g/ 50 mL sodium chloride IVPB (has no administration in time range)  nitroGLYCERIN (NITROGLYN) 2 % ointment 1 inch (1 inch Topical Given 03/27/19 1149)  sodium bicarbonate injection 50 mEq (50 mEq Intravenous Given 03/27/19 1209)  furosemide (LASIX) injection 80 mg (80 mg Intravenous Given 03/27/19 1226)    Mobility walks Low fall risk   Focused Assessments    R Recommendations: See Admitting Provider Note  Report given to:   Additional Notes:

## 2019-03-27 NOTE — ED Notes (Signed)
Nephrologist at bedside

## 2019-03-27 NOTE — Progress Notes (Signed)
HD Tx started w/o complication, This is pt's 1st HD Treatment,    03/27/19 1610  Vital Signs  Pulse Rate 83  Resp (!) 29  BP (!) 178/120  Oxygen Therapy  SpO2 100 %  During Hemodialysis Assessment  Blood Flow Rate (mL/min) 200 mL/min  Arterial Pressure (mmHg) -80 mmHg  Venous Pressure (mmHg) 50 mmHg  Transmembrane Pressure (mmHg) 40 mmHg  Ultrafiltration Rate (mL/min) 1000 mL/min  Dialysate Flow Rate (mL/min) 300 ml/min  Conductivity: Machine  13.7  HD Safety Checks Performed Yes  Dialysis Fluid Bolus Normal Saline  Bolus Amount (mL) 250 mL  Intra-Hemodialysis Comments Tx initiated   Pt educated on CVC safety and overview of dialysis, possible symptoms during Tx. Pt is hypertensive and fluid overload, expected to improve with treatment.

## 2019-03-27 NOTE — H&P (Addendum)
[facility] [place] NAME: Devin Harmon    MR#:  [id number]  DATE OF BIRTH:  12-Apr-1956  DATE OF ADMISSION:  03/27/2019  PRIMARY CARE PHYSICIAN: Venia Carbon, MD   REQUESTING/REFERRING PHYSICIAN: Delman Kitten, MD  CHIEF COMPLAINT:   Chief Complaint  Patient presents with  . Shortness of Breath  . Tremors    HISTORY OF PRESENT ILLNESS:  Devin Harmon  is a 63 y.o. male with a known history of hypertension and end-stage renal disease who presented to the ED with progressively worsening shortness of breath over the last couple of days.  He is unable to lay flat.  He denies any fevers, chills, or cough.  He endorses worsening lower extremity edema.  He denies any chest pain.  He has had end-stage renal disease and has been recommended to start dialysis, but he has been refusing left over the last couple of weeks to months.  He states he is ready to start dialysis at this point.  In the ED, he had markedly increased work of breathing and hypoxia.  He was placed on high flow nasal cannula.  He was also hypertensive.  Are significant for potassium 5.4, creatinine 18.91, BNP 3985, hemoglobin 8.0.  Hospitalists were called for admission.  PAST MEDICAL HISTORY:   Past Medical History:  Diagnosis Date  . Allergy   . BPH (benign prostatic hypertrophy)   . Hypertension   . Renal insufficiency   . Zoster 2007   facial, hospital 2007    PAST SURGICAL HISTORY:  History reviewed. No pertinent surgical history.  SOCIAL HISTORY:   Social History   Tobacco Use  . Smoking status: Never Smoker  . Smokeless tobacco: Never Used  Substance Use Topics  . Alcohol use: Yes    Comment: occasional    FAMILY HISTORY:   Family History  Problem Relation Age of Onset  . Hypertension Mother   . Cancer Mother        lung cancer  . Hypertension Brother   . Heart disease Father   . Coronary artery disease Neg Hx   . Diabetes Neg Hx     DRUG  ALLERGIES:   Allergies  Allergen Reactions  . Chlorthalidone     REACTION: felt "bad" and ED  . Hydrocodone Other (See Comments)    Nausea and dizzy  . Sulfonamide Derivatives     REACTION: unspecified    REVIEW OF SYSTEMS:   Review of Systems  Constitutional: Negative for chills and fever.  HENT: Negative for congestion and sore throat.   Eyes: Negative for blurred vision and double vision.  Respiratory: Positive for shortness of breath. Negative for cough.   Cardiovascular: Positive for leg swelling. Negative for chest pain and palpitations.  Gastrointestinal: Negative for nausea and vomiting.  Genitourinary: Negative for dysuria and urgency.  Musculoskeletal: Negative for back pain and neck pain.  Neurological: Positive for tremors. Negative for dizziness and headaches.  Psychiatric/Behavioral: Negative for depression. The patient is not nervous/anxious.     MEDICATIONS AT HOME:   Prior to Admission medications   Medication Sig Start Date End Date Taking? Authorizing Provider  acetaminophen (TYLENOL) 500 MG tablet Take 500-1,000 mg by mouth every 6 (six) hours as needed for mild pain.    [provider]  amLODipine (NORVASC) 10 MG tablet Take 1 tablet (10 mg total) by mouth daily. 10/13/18   Venia Carbon, MD  cetirizine (ZYRTEC) 10 MG tablet Take 10  mg by mouth daily.    [provider]  labetalol (NORMODYNE) 100 MG tablet Take 1 tablet (100 mg total) by mouth 2 (two) times daily. 10/13/18   Venia Carbon, MD  montelukast (SINGULAIR) 10 MG tablet Take 1 tablet (10 mg total) by mouth at bedtime. 02/10/19   Venia Carbon, MD  Multiple Vitamin (MULTIVITAMIN WITH MINERALS) TABS tablet Take 1 tablet by mouth daily. 09/02/18   Samuella Cota, MD  sodium bicarbonate 650 MG tablet Take 1 tablet (650 mg total) by mouth 3 (three) times daily. 10/08/18   Venia Carbon, MD  terazosin (HYTRIN) 10 MG capsule Take 1 capsule (10 mg total) by mouth at  bedtime. 10/22/18   Venia Carbon, MD  thiamine 100 MG tablet Take 1 tablet (100 mg total) by mouth daily. 09/02/18   Samuella Cota, MD      VITAL SIGNS:  Blood pressure (!) 169/105, pulse 76, temperature 97.8 F (36.6 C), temperature source Oral, resp. rate (!) 28, height 5\' 9"  (1.753 m), weight 93 kg, SpO2 100 %.  PHYSICAL EXAMINATION:  Physical Exam  GENERAL:  63 y.o.-year-old patient lying in the bed with no acute distress.  EYES: Pupils equal, round, reactive to light and accommodation. No scleral icterus. Extraocular muscles intact.  HEENT: Head atraumatic, normocephalic. Oropharynx and nasopharynx clear.  NECK:  Supple, no jugular venous distention. No thyroid enlargement, no tenderness.  LUNGS: + Markedly increased work of breathing, HFNC in place, + diminished breath sounds in the lung bases bilaterally. + Faint bibasilar crackles CARDIOVASCULAR: RRR, S1, S2 normal. No murmurs, rubs, or gallops.  ABDOMEN: Soft, nontender, nondistended. Bowel sounds present. No organomegaly or mass.  EXTREMITIES: No cyanosis, or clubbing. + Check bilateral edema of the feet and ankles NEUROLOGIC: Cranial nerves II through XII are intact. + Global weakness. Sensation intact. Gait not checked. + Bilateral upper extremity tremor PSYCHIATRIC: The patient is alert and oriented x 3.  SKIN: No obvious rash, lesion, or ulcer.   LABORATORY PANEL:   CBC Recent Labs  Lab 03/27/19 1040  WBC 11.0*  HGB 8.0*  HCT 24.6*  PLT 314   ------------------------------------------------------------------------------------------------------------------  Chemistries  Recent Labs  Lab 03/27/19 1040  NA 134*  K 5.4*  CL 103  CO2 12*  GLUCOSE 113*  BUN 145*  CREATININE 18.91*  CALCIUM 6.7*  MG 2.0  AST 10*  ALT 10  ALKPHOS 55  BILITOT 0.6   ------------------------------------------------------------------------------------------------------------------  Cardiac Enzymes No results for  input(s): TROPONINI in the last 168 hours. ------------------------------------------------------------------------------------------------------------------  RADIOLOGY:  Dg Chest Portable 1 View  Result Date: 03/27/2019 CLINICAL DATA:  Shortness of breath.  Hypoxia. EXAM: PORTABLE CHEST 1 VIEW COMPARISON:  August 27, 2017 FINDINGS: Bilateral patchy infiltrates are identified. Stable cardiomegaly. The hila and mediastinum are normal. No pneumothorax. No other acute abnormalities. IMPRESSION: Bilateral patchy infiltrates may represent multifocal pneumonia versus pulmonary edema. Recommend clinical correlation. Electronically Signed   By: Dorise Bullion III M.D   On: 03/27/2019 12:06      IMPRESSION AND PLAN:   Acute hypoxic respiratory failure secondary to pulmonary edema in the setting of ESRD- requiring high flow nasal cannula in the ED. No fevers, chills, cough to suggest pneumonia. -COVID testing negative -Patient agreeable to start dialysis at this time -Given Lasix 80 mg IV x1 in the ED -Wean O2 as able  ESRD- new HD patient. -ICU physician to place temporary HD catheter today -Vascular consult for PermCath -Nephrology consult- plan for HD  today -Check renal US  Hyperkalemia- K 5.4, due to above -Given calcium gluconate in the ED -Plan for HD today  Hypertension-blood pressures elevated in the ED, due to volume overload. -Continue home Norvasc, labetalol, terazosin  Anemia of chronic kidney disease- hemoglobin 8.0, ferritin 517 -Management per nephrology  All the records are reviewed and case discussed with ED provider. Management plans discussed with the patient, family and they are in agreement.  CODE STATUS: Partial code- patient agreeable to BiPAP and intubation. He declines cardiac resuscitation.  TOTAL TIME TAKING CARE OF THIS PATIENT: 45 minutes.    Berna Spare Kaileigh Viswanathan M.D on 03/27/2019 at 12:58 PM  Between 7am to 6pm - Pager - [phone]  After 6pm go to  [email or web address] - Proofreader  [facility]  Office  [phone]  CC: Primary care physician; Venia Carbon, MD   Note: This dictation was prepared with Dragon dictation along with smaller phrase technology. Any transcriptional errors that result from this process are unintentional.

## 2019-03-27 NOTE — ED Notes (Signed)
Wife- [place] [phone]  Adella Nissen [phone]

## 2019-03-27 NOTE — ED Notes (Signed)
High flow [place] started at 8L by respiratory

## 2019-03-28 ENCOUNTER — Telehealth: Payer: Self-pay

## 2019-03-28 ENCOUNTER — Inpatient Hospital Stay: Payer: 59

## 2019-03-28 LAB — PREPARE RBC (CROSSMATCH)

## 2019-03-28 LAB — CBC
HCT: 20.6 % — ABNORMAL LOW (ref 39.0–52.0)
Hemoglobin: 6.8 g/dL — ABNORMAL LOW (ref 13.0–17.0)
MCH: 26.8 pg (ref 26.0–34.0)
MCHC: 33 g/dL (ref 30.0–36.0)
MCV: 81.1 fL (ref 80.0–100.0)
Platelets: 237 10*3/uL (ref 150–400)
RBC: 2.54 MIL/uL — ABNORMAL LOW (ref 4.22–5.81)
RDW: 16.3 % — ABNORMAL HIGH (ref 11.5–15.5)
WBC: 8.3 10*3/uL (ref 4.0–10.5)
nRBC: 0 % (ref 0.0–0.2)

## 2019-03-28 LAB — BASIC METABOLIC PANEL
Anion gap: 16 — ABNORMAL HIGH (ref 5–15)
BUN: 115 mg/dL — ABNORMAL HIGH (ref 8–23)
CO2: 20 mmol/L — ABNORMAL LOW (ref 22–32)
Calcium: 7.1 mg/dL — ABNORMAL LOW (ref 8.9–10.3)
Chloride: 102 mmol/L (ref 98–111)
Creatinine, Ser: 15.2 mg/dL — ABNORMAL HIGH (ref 0.61–1.24)
GFR calc Af Amer: 3 mL/min — ABNORMAL LOW (ref >60)
GFR calc non Af Amer: 3 mL/min — ABNORMAL LOW (ref >60)
Glucose, Bld: 115 mg/dL — ABNORMAL HIGH (ref 70–99)
Potassium: 4.5 mmol/L (ref 3.5–5.1)
Sodium: 138 mmol/L (ref 135–145)

## 2019-03-28 LAB — IRON AND TIBC
Iron: 25 ug/dL — ABNORMAL LOW (ref 45–182)
Saturation Ratios: 12 % — ABNORMAL LOW (ref 17.9–39.5)
TIBC: 209 ug/dL — ABNORMAL LOW (ref 250–450)
UIBC: 184 ug/dL

## 2019-03-28 LAB — VITAMIN B12: Vitamin B-12: 276 pg/mL (ref 180–914)

## 2019-03-28 LAB — HEPATIC FUNCTION PANEL
ALT: 12 U/L (ref 0–44)
AST: 21 U/L (ref 15–41)
Albumin: 3.3 g/dL — ABNORMAL LOW (ref 3.5–5.0)
Alkaline Phosphatase: 51 U/L (ref 38–126)
Bilirubin, Direct: <0.1 mg/dL (ref 0.0–0.2)
Total Bilirubin: 0.9 mg/dL (ref 0.3–1.2)
Total Protein: 6.5 g/dL (ref 6.5–8.1)

## 2019-03-28 LAB — RETICULOCYTES
Immature Retic Fract: 20.1 % — ABNORMAL HIGH (ref 2.3–15.9)
RBC.: 2.59 MIL/uL — ABNORMAL LOW (ref 4.22–5.81)
Retic Count, Absolute: 72.3 10*3/uL (ref 19.0–186.0)
Retic Ct Pct: 2.8 % (ref 0.4–3.1)

## 2019-03-28 LAB — FOLATE: Folate: 4.9 ng/mL — ABNORMAL LOW (ref >5.9)

## 2019-03-28 LAB — FERRITIN: Ferritin: 462 ng/mL — ABNORMAL HIGH (ref 24–336)

## 2019-03-28 LAB — TRANSFERRIN: Transferrin: 168 mg/dL — ABNORMAL LOW (ref 180–329)

## 2019-03-28 LAB — HEMOGLOBIN AND HEMATOCRIT, BLOOD
HCT: 24.8 % — ABNORMAL LOW (ref 39.0–52.0)
Hemoglobin: 8.3 g/dL — ABNORMAL LOW (ref 13.0–17.0)

## 2019-03-28 LAB — AMMONIA: Ammonia: 15 umol/L (ref 9–35)

## 2019-03-28 LAB — ABO/RH: ABO/RH(D): B POS

## 2019-03-28 LAB — MAGNESIUM: Magnesium: 2 mg/dL (ref 1.7–2.4)

## 2019-03-28 LAB — PHOSPHORUS: Phosphorus: 9.1 mg/dL — ABNORMAL HIGH (ref 2.5–4.6)

## 2019-03-28 MED ORDER — MORPHINE SULFATE (PF) 2 MG/ML IV SOLN
INTRAVENOUS | Status: AC
  Filled 2019-03-28: qty 1

## 2019-03-28 MED ORDER — SODIUM CHLORIDE 0.9% IV SOLUTION
Freq: Once | INTRAVENOUS | Status: AC
  Administered 2019-03-28: 12:00:00 via INTRAVENOUS

## 2019-03-28 MED ORDER — HYDRALAZINE HCL 20 MG/ML IJ SOLN
10.0000 mg | Freq: Four times a day (QID) | INTRAMUSCULAR | Status: DC | PRN
  Administered 2019-03-28 – 2019-03-29 (×4): 10 mg via INTRAVENOUS
  Filled 2019-03-28 (×3): qty 1

## 2019-03-28 MED ORDER — HYDRALAZINE HCL 20 MG/ML IJ SOLN
INTRAMUSCULAR | Status: AC
  Administered 2019-03-28: 15:00:00 10 mg via INTRAVENOUS
  Filled 2019-03-28: qty 1

## 2019-03-28 MED ORDER — MORPHINE SULFATE (PF) 2 MG/ML IV SOLN
1.0000 mg | Freq: Once | INTRAVENOUS | Status: AC
  Administered 2019-03-28: 15:00:00 1 mg via INTRAVENOUS

## 2019-03-28 NOTE — Progress Notes (Signed)
[facility]  ROUNDING NOTE   Subjective:  Patient seen and evaluated at bedside immediately after his second dialysis treatment. He tolerated second dialysis treatment better today as there was no cramping. Patient still appears quite confused.    Objective:  Vital signs in last 24 hours:  Temp:  [97 F (36.1 C)-97.8 F (36.6 C)] 97.8 F (36.6 C) (04/27 1030) Pulse Rate:  [33-149] 78 (04/27 1045) Resp:  [21-45] 30 (04/27 1045) BP: (140-201)/(54-175) 182/99 (04/27 1045) SpO2:  [83 %-100 %] 98 % (04/27 1045) Weight:  [91.5 kg-93 kg] 91.7 kg (04/27 0700)  Weight change:  Filed Weights   03/27/19 1600 03/27/19 1830 03/28/19 0700  Weight: 93 kg 91.5 kg 91.7 kg    Intake/Output: I/O last 3 completed shifts: In: 200 [P.O.:100; IV Piggyback:100] Out: 1951 [Urine:450; Other:1501]   Intake/Output this shift:  Total I/O In: -  Out: 1001 [Other:1001]  Physical Exam: General: No acute distress  Head: Normocephalic, atraumatic. Moist oral mucosal membranes  Eyes: Anicteric  Neck: Supple, trachea midline  Lungs:   Basilar rales, normal effort  Heart: S1S2 no obvious rub  Abdomen:  Soft, nontender, bowel sounds present  Extremities: traceperipheral edema.  Neurologic: Awake, but confused  Skin: No lesions  Access: Left internal jugular temporary dialysis catheter    Basic Metabolic Panel: Recent Labs  Lab 03/27/19 1040 03/27/19 1727 03/27/19 2010 03/28/19 0515  NA 134*  --  138 138  K 5.4*  --  4.3 4.5  CL 103  --  101 102  CO2 12*  --  16* 20*  GLUCOSE 113*  --  102* 115*  BUN 145*  --  92* 115*  CREATININE 18.91*  --  13.86* 15.20*  CALCIUM 6.7*  --  7.5* 7.1*  MG 2.0  --  1.8 2.0  PHOS  --  7.6* 7.3* 9.1*    Liver Function Tests: Recent Labs  Lab 03/27/19 1040 03/27/19 2010  AST 10* 11*  ALT 10 10  ALKPHOS 55 58  BILITOT 0.6 1.0  PROT 7.4 6.7  ALBUMIN 3.9 3.8   No results for input(s): LIPASE, AMYLASE in the last 168 hours. No  results for input(s): AMMONIA in the last 168 hours.  CBC: Recent Labs  Lab 03/27/19 1040 03/27/19 2010 03/28/19 0515  WBC 11.0* 10.2 8.3  NEUTROABS 9.2*  --   --   HGB 8.0* 7.5* 6.8*  HCT 24.6* 22.5* 20.6*  MCV 82.0 80.4 81.1  PLT 314 267 237    Cardiac Enzymes: Recent Labs  Lab 03/27/19 2010  TROPONINI 0.05*    BNP: Invalid input(s): POCBNP  CBG: Recent Labs  Lab 03/27/19 [place]*    Microbiology: Results for orders placed or performed during the hospital encounter of 03/27/19  SARS Coronavirus 2 [facility] order, Performed in [place] hospital lab)     Status: None   Collection Time: 03/27/19 10:55 AM  Result Value Ref Range Status   SARS Coronavirus 2 NEGATIVE NEGATIVE Final    Comment: (NOTE) If result is NEGATIVE SARS-CoV-2 target nucleic acids are NOT DETECTED. The SARS-CoV-2 RNA is generally detectable in upper and lower  respiratory specimens during the acute phase of infection. The lowest  concentration of SARS-CoV-2 viral copies this assay can detect is 250  copies / mL. A negative result does not preclude SARS-CoV-2 infection  and should not be used as the sole basis for treatment or other  patient management decisions.  A negative result may occur  with  improper specimen collection / handling, submission of specimen other  than nasopharyngeal swab, presence of viral mutation(s) within the  areas targeted by this assay, and inadequate number of viral copies  (<250 copies / mL). A negative result must be combined with clinical  observations, patient history, and epidemiological information. If result is POSITIVE SARS-CoV-2 target nucleic acids are DETECTED. The SARS-CoV-2 RNA is generally detectable in upper and lower  respiratory specimens dur ing the acute phase of infection.  Positive  results are indicative of active infection with SARS-CoV-2.  Clinical  correlation with patient history and other diagnostic information is   necessary to determine patient infection status.  Positive results do  not rule out bacterial infection or co-infection with other viruses. If result is PRESUMPTIVE POSTIVE SARS-CoV-2 nucleic acids MAY BE PRESENT.   A presumptive positive result was obtained on the submitted specimen  and confirmed on repeat testing.  While 2019 novel coronavirus  (SARS-CoV-2) nucleic acids may be present in the submitted sample  additional confirmatory testing may be necessary for epidemiological  and / or clinical management purposes  to differentiate between  SARS-CoV-2 and other Sarbecovirus currently known to infect humans.  If clinically indicated additional testing with an alternate test  methodology [id number]) is advised. The SARS-CoV-2 RNA is generally  detectable in upper and lower respiratory sp ecimens during the acute  phase of infection. The expected result is Negative. Fact Sheet for Patients:  StrictlyIdeas.no Fact Sheet for Healthcare Providers: BankingDealers.co.za This test is not yet approved or cleared by the [place] FDA and has been authorized for detection and/or diagnosis of SARS-CoV-2 by FDA under an Emergency Use Authorization (EUA).  This EUA will remain in effect (meaning this test can be used) for the duration of the COVID-19 declaration under Section 564(b)(1) of the Act, 21 U.S.C. section 360bbb-3(b)(1), unless the authorization is terminated or revoked sooner. Performed at [facility], [address], [place], [place] [address]   Blood Culture (routine x 2)     Status: None (Preliminary result)   Collection Time: 03/27/19 10:56 AM  Result Value Ref Range Status   Specimen Description BLOOD RFA  Final   Special Requests   Final    BOTTLES DRAWN AEROBIC AND ANAEROBIC Blood Culture results may not be optimal due to an excessive volume of blood received in culture bottles   Culture   Final    NO GROWTH < 24  HOURS Performed at [facility], [address], [place], [place] [address]    Report Status PENDING  Incomplete  Blood Culture (routine x 2)     Status: None (Preliminary result)   Collection Time: 03/27/19 11:00 AM  Result Value Ref Range Status   Specimen Description BLOOD RH  Final   Special Requests   Final    BOTTLES DRAWN AEROBIC AND ANAEROBIC Blood Culture adequate volume   Culture   Final    NO GROWTH < 24 HOURS Performed at [facility], [address] [place], [place] [address]    Report Status PENDING  Incomplete  MRSA PCR Screening     Status: None   Collection Time: 03/27/19  2:41 PM  Result Value Ref Range Status   MRSA by PCR NEGATIVE NEGATIVE Final    Comment:        The GeneXpert MRSA Assay (FDA approved for NASAL specimens only), is one component of a comprehensive MRSA colonization surveillance program. It is not intended to diagnose MRSA  infection nor to guide or monitor treatment for MRSA infections. Performed at [facility], [address] [place], [place] [address]     Coagulation Studies: Recent Labs    03/27/19 1040  LABPROT 14.9  INR 1.2    Urinalysis: No results for input(s): COLORURINE, LABSPEC, PHURINE, GLUCOSEU, HGBUR, BILIRUBINUR, KETONESUR, PROTEINUR, UROBILINOGEN, NITRITE, LEUKOCYTESUR in the last 72 hours.  Invalid input(s): APPERANCEUR    Imaging: Dg Chest Port 1 View  Result Date: 03/27/2019 CLINICAL DATA:  Central line placement EXAM: PORTABLE CHEST 1 VIEW COMPARISON:  03/27/2019, 08/27/2018 FINDINGS: Left-sided central venous catheter tip projects over the intra hepatic IVC. No left pneumothorax. Small bilateral pleural effusions. Slight worsening of bilateral interstitial and alveolar extensive airspace opacities. Stable cardiomediastinal silhouette with aortic atherosclerosis. IMPRESSION: 1. Left-sided central venous catheter tip overlies the intra hepatic IVC. No left pneumothorax. 2. Small  pleural effusions. Slight worsening of extensive interstitial and alveolar opacities bilaterally. Electronically Signed   By: Donavan Foil M.D.   On: 03/27/2019 15:48   Dg Chest Portable 1 View  Result Date: 03/27/2019 CLINICAL DATA:  Shortness of breath.  Hypoxia. EXAM: PORTABLE CHEST 1 VIEW COMPARISON:  August 27, 2017 FINDINGS: Bilateral patchy infiltrates are identified. Stable cardiomegaly. The hila and mediastinum are normal. No pneumothorax. No other acute abnormalities. IMPRESSION: Bilateral patchy infiltrates may represent multifocal pneumonia versus pulmonary edema. Recommend clinical correlation. Electronically Signed   By: Dorise Bullion III M.D   On: 03/27/2019 12:06     Medications:   . sodium chloride    . sodium chloride    . calcium gluconate     . sodium chloride   Intravenous Once  . amLODipine  10 mg Oral QPM  . Chlorhexidine Gluconate Cloth  6 each Topical Q0600  . fluticasone  1-2 spray Each Nare Daily  . heparin  5,000 Units Subcutaneous Q8H  . labetalol  100 mg Oral QHS  . loratadine  10 mg Oral Daily  . mouth rinse  15 mL Mouth Rinse q12n4p  . nitroGLYCERIN  1 inch Topical Q6H  . sodium bicarbonate  650 mg Oral TID  . tuberculin  5 Units Intradermal Once   sodium chloride, sodium chloride, acetaminophen **OR** acetaminophen, alteplase, diazepam, feeding supplement (NEPRO CARB STEADY), heparin, ondansetron **OR** ondansetron (ZOFRAN) IV, polyethylene glycol  Assessment/ Plan:  63 y.o. male  with hypertension, BPH, advanced CKD, was admitted on 03/27/2019 with uremia and acute pulmonary edema.   1.  End-stage renal disease 2.  Acute pulmonary edema 3.  Severe hypertension 4.  Lower extremity edema 5.  Severe acidosis. 6.  Anemia, likely of chronic kidney disease 7.  2D echo from September 2019 show moderate concentric left ventricular hypertrophy, LVEF 55 to 70%, grade 1 diastolic dysfunction, severely dilated left atrium  Plan: Thus far the patient  has tolerated initiation of dialysis well.  He underwent his second dialysis treatment today.  He is awake but remains confused periodically.  Hopefully his mental status will improve as his uremia continues to improve.  We will plan for third dialysis treatment tomorrow.  Thereafter we will likely need to place PermCath.  We also talked about the potential for outpatient hemodialysis with the patient.  Dialysis liaison will talk with the patient once he is out of the intensive care unit.  Otherwise continue supportive care at this time.   LOS: 1 Rynell Ciotti 4/27/202011:24 AM

## 2019-03-28 NOTE — Progress Notes (Addendum)
[facility] NAME: Devin Harmon    MR#:  [id number]  DATE OF BIRTH:  September 01, 1956  SUBJECTIVE:   Patient came in with increasing shortness of breath. Started on hemodialysis not feeling better. Generalized weakness. REVIEW OF SYSTEMS:   Review of Systems  Constitutional: Negative for chills, fever and weight loss.  HENT: Negative for ear discharge, ear pain and nosebleeds.   Eyes: Negative for blurred vision, pain and discharge.  Respiratory: Positive for shortness of breath. Negative for sputum production, wheezing and stridor.   Cardiovascular: Negative for chest pain, palpitations, orthopnea and PND.  Gastrointestinal: Negative for abdominal pain, diarrhea, nausea and vomiting.  Genitourinary: Negative for frequency and urgency.  Musculoskeletal: Negative for back pain and joint pain.  Neurological: Negative for sensory change, speech change, focal weakness and weakness.  Psychiatric/Behavioral: Negative for depression and hallucinations. The patient is not nervous/anxious.    Tolerating Diet:yes Tolerating PT: pending  DRUG ALLERGIES:   Allergies  Allergen Reactions  . Chlorthalidone     REACTION: felt "bad" and ED  . Hydrocodone Other (See Comments)    Nausea and dizzy  . Sulfonamide Derivatives     REACTION: unspecified    VITALS:  Blood pressure (!) 182/99, pulse 78, temperature 97.8 F (36.6 C), temperature source Oral, resp. rate (!) 30, height 5\' 9"  (1.753 m), weight 91.7 kg, SpO2 98 %.  PHYSICAL EXAMINATION:   Physical Exam  GENERAL:  63 y.o.-year-old patient lying in the bed with no acute distress. Chronically ill EYES: Pupils equal, round, reactive to light and accommodation. No scleral icterus. Extraocular muscles intact.  HEENT: Head atraumatic, normocephalic. Oropharynx and nasopharynx clear.  NECK:  Supple, no jugular venous distention. No thyroid enlargement, no tenderness.  LUNGS: Normal  breath sounds bilaterally, no wheezing, rales, rhonchi. No use of accessory muscles of respiration.  HD cath+ CARDIOVASCULAR: S1, S2 normal. No murmurs, rubs, or gallops.  ABDOMEN: Soft, nontender, nondistended. Bowel sounds present. No organomegaly or mass.  EXTREMITIES: No cyanosis, clubbing or edema b/l.    NEUROLOGIC: Cranial nerves II through XII are intact. No focal Motor or sensory deficits b/l.   PSYCHIATRIC:  patient is alert and oriented x 3.  SKIN: No obvious rash, lesion, or ulcer.   LABORATORY PANEL:  CBC Recent Labs  Lab 03/28/19 0515  WBC 8.3  HGB 6.8*  HCT 20.6*  PLT 237    Chemistries  Recent Labs  Lab 03/27/19 2010 03/28/19 0515  NA 138 138  K 4.3 4.5  CL 101 102  CO2 16* 20*  GLUCOSE 102* 115*  BUN 92* 115*  CREATININE 13.86* 15.20*  CALCIUM 7.5* 7.1*  MG 1.8 2.0  AST 11*  --   ALT 10  --   ALKPHOS 58  --   BILITOT 1.0  --    Cardiac Enzymes Recent Labs  Lab 03/27/19 2010  TROPONINI 0.05*   RADIOLOGY:  Dg Chest Port 1 View  Result Date: 03/27/2019 CLINICAL DATA:  Central line placement EXAM: PORTABLE CHEST 1 VIEW COMPARISON:  03/27/2019, 08/27/2018 FINDINGS: Left-sided central venous catheter tip projects over the intra hepatic IVC. No left pneumothorax. Small bilateral pleural effusions. Slight worsening of bilateral interstitial and alveolar extensive airspace opacities. Stable cardiomediastinal silhouette with aortic atherosclerosis. IMPRESSION: 1. Left-sided central venous catheter tip overlies the intra hepatic IVC. No left pneumothorax. 2. Small pleural effusions. Slight worsening of extensive interstitial and alveolar opacities bilaterally. Electronically Signed   By: Donavan Foil  M.D.   On: 03/27/2019 15:48   Dg Chest Portable 1 View  Result Date: 03/27/2019 CLINICAL DATA:  Shortness of breath.  Hypoxia. EXAM: PORTABLE CHEST 1 VIEW COMPARISON:  August 27, 2017 FINDINGS: Bilateral patchy infiltrates are identified. Stable cardiomegaly.  The hila and mediastinum are normal. No pneumothorax. No other acute abnormalities. IMPRESSION: Bilateral patchy infiltrates may represent multifocal pneumonia versus pulmonary edema. Recommend clinical correlation. Electronically Signed   By: Dorise Bullion III M.D   On: 03/27/2019 12:06   ASSESSMENT AND PLAN:   Devin Harmon  is a 63 y.o. male with a known history of hypertension and end-stage renal disease who presented to the ED with progressively worsening shortness of breath over the last couple of days.  He is unable to lay flat  *Acute hypoxic respiratory failure secondary to pulmonary edema in the setting of ESRD- requiring high flow nasal cannula in the ED. No fevers, chills, cough to suggest pneumonia. -COVID testing negative -Patient now started on HD -Wean O2 as able  *ESRD- new HD patient. -ICU physician to place temporary HD catheter today -Vascular consult for PermCath -Nephrology consult- plan for HD today -Check renal US  *Hyperkalemia- K 5.4, due to above -Given calcium gluconate in the ED -Plan for HD today  *Hypertension-blood pressures elevated in the ED, due to volume overload. -Continue home Norvasc, labetalol, terazosin  *Anemia of chronic kidney disease- hemoglobin 8.0, ferritin 517 -Management per nephrology   CODE STATUS: partial  DVT Prophylaxis: heparin  TOTAL TIME TAKING CARE OF THIS PATIENT: *30* minutes.  >50% time spent on counselling and coordination of care  POSSIBLE D/C IN *1-2* DAYS, DEPENDING ON CLINICAL CONDITION.  Note: This dictation was prepared with Dragon dictation along with smaller phrase technology. Any transcriptional errors that result from this process are unintentional.  Fritzi Mandes M.D on 03/28/2019 at 3:48 PM  Between 7am to 6pm - Pager - [phone]  After 6pm go to [email or web address] - password [email or web address] [facility]  Office  [phone]  CC: Primary care physician; Venia Carbon, MDPatient  ID: Devin Harmon, male   DOB: 1956-06-13, 63 y.o.   MRN: [id number]

## 2019-03-28 NOTE — Progress Notes (Signed)
Post HD Assessment    03/28/19 1012  Neurological  Level of Consciousness Alert  Orientation Level Oriented X4  Respiratory  Respiratory Pattern Regular;Unlabored  Chest Assessment Chest expansion symmetrical  Bilateral Breath Sounds Clear;Diminished  Cough None  Cardiac  Pulse Regular  ECG Monitor Yes  Cardiac Rhythm NSR  Vascular  R Radial Pulse +2  L Radial Pulse +2  Edema Generalized  Generalized Edema +1  Psychosocial  Psychosocial (WDL) WDL  Patient Behaviors Calm;Cooperative  Emotional support given Given to patient

## 2019-03-28 NOTE — Progress Notes (Signed)
HD Tx completed, tolerated well, uf goal met.    03/28/19 0957  Vital Signs  Pulse Rate 77  Pulse Rate Source Monitor  Resp (!) 26  BP (!) 184/101  BP Location Right Arm  BP Method Automatic  Patient Position (if appropriate) Lying  Oxygen Therapy  SpO2 100 %  O2 Device Nasal Cannula  Pulse Oximetry Type Continuous  During Hemodialysis Assessment  HD Safety Checks Performed Yes  KECN 34.6 KECN  Dialysis Fluid Bolus Normal Saline  Bolus Amount (mL) 250 mL  Intra-Hemodialysis Comments Tx completed;Tolerated well

## 2019-03-28 NOTE — Telephone Encounter (Signed)
Per chart review tab pt on 03/27/19 pt was admitted to [facility] ICU.

## 2019-03-28 NOTE — Progress Notes (Signed)
Pre HD Tx   03/28/19 0700  Vital Signs  Temp 97.6 F (36.4 C)  Temp Source Oral  Pulse Rate 64  Pulse Rate Source Monitor  Resp (!) 21  BP (!) 158/93  BP Location Right Arm  BP Method Automatic  Patient Position (if appropriate) Lying  Oxygen Therapy  SpO2 100 %  O2 Device Nasal Cannula  Pulse Oximetry Type Continuous  Pain Assessment  Pain Scale 0-10  Pain Score 0  Dialysis Weight  Weight 91.7 kg  Type of Weight Pre-Dialysis  Time-Out for Hemodialysis  What Procedure? HD  Pt Identifiers(min of two) First/Last Name;MRN/Account#  Correct Site? Yes  Correct Side? Yes  Correct Procedure? Yes  Consents Verified? Yes  Rad Studies Available? N/A  Safety Precautions Reviewed? Yes  Engineer, civil (consulting) Number 4  Station Number  (Bedside ICU 16)  UF/Alarm Test Passed  Conductivity: Meter 13.8  Conductivity: Machine  13.6  pH 7.2  Reverse Osmosis RO#3  Normal Saline Lot Number [id number]  Dialyzer Lot Number [id number]  Disposable Set Lot Number [id number]  Machine Temperature 98.6 F (37 C)  Musician and Audible Yes  Blood Lines Intact and Secured Yes  Pre Treatment Patient Checks  Vascular access used during treatment Catheter  Hepatitis B Surface Antigen Results  (unknown)  Isolation Initiated Yes  Hepatitis B Surface Antibody  (unknown)  Date Hepatitis B Surface Antibody Drawn 03/27/19  Hemodialysis Consent Verified Yes  Hemodialysis Standing Orders Initiated Yes  ECG (Telemetry) Monitor On Yes  Prime Ordered Normal Saline  Length of  DialysisTreatment -hour(s) 2.5 Hour(s)  Dialysis Treatment Comments Na 140  Dialyzer Elisio 17H NR  Dialysate 2K, 2.5 Ca  Dialysis Anticoagulant None  Dialysate Flow Ordered 500  Blood Flow Rate Ordered 250 mL/min  Ultrafiltration Goal 1 Liters  Pre Treatment Labs Type & Screen  Dialysis Blood Pressure Support Ordered Normal Saline  Education / Care Plan  Dialysis Education Provided Yes  Documented Education in Care  Plan Yes  Hemodialysis Catheter  Placement Date: 03/27/19    Site Condition No complications  Blue Lumen Status Blood return noted  Red Lumen Status Blood return noted  Purple Lumen Status Capped (Central line)  Dressing Type Biopatch;Occlusive  Dressing Status Clean;Dry;Intact

## 2019-03-28 NOTE — Progress Notes (Signed)
HD Tx started w/o complication, 2nd HD Tx for pt.   03/28/19 0726  Vital Signs  Pulse Rate 70  Resp (!) 25  BP (!) 169/97  Oxygen Therapy  SpO2 99 %  During Hemodialysis Assessment  Blood Flow Rate (mL/min) 250 mL/min  Arterial Pressure (mmHg) -110 mmHg  Venous Pressure (mmHg) 80 mmHg  Transmembrane Pressure (mmHg) 40 mmHg  Ultrafiltration Rate (mL/min) 500 mL/min  Dialysate Flow Rate (mL/min) 500 ml/min  Conductivity: Machine  13.6  HD Safety Checks Performed Yes  Dialysis Fluid Bolus Normal Saline  Bolus Amount (mL) 250 mL  Intra-Hemodialysis Comments Tx initiated

## 2019-03-28 NOTE — Progress Notes (Signed)
Pre HD Assessment    03/28/19 0720  Neurological  Level of Consciousness Alert  Orientation Level Oriented X4  Respiratory  Respiratory Pattern Regular;Unlabored  Chest Assessment Chest expansion symmetrical  Bilateral Breath Sounds Diminished  Cough None  Cardiac  Pulse Regular  ECG Monitor Yes  Cardiac Rhythm NSR  Vascular  R Radial Pulse +2  L Radial Pulse +2  Psychosocial  Psychosocial (WDL) WDL  Patient Behaviors Calm;Cooperative  Emotional support given Given to patient

## 2019-03-28 NOTE — Telephone Encounter (Signed)
[facility] Patient Name: Devin Harmon Gender: Male DOB: Apr 17, 1956 Age: 63 Y 104 M 17 D Return Phone Number: [phone] (Primary) Address: City/State/Zip: [place] [address] [facility] Night - Client Client Site [place] Physician Viviana Simpler - MD Contact Type Call Who Is Calling Patient / Member / Family / Caregiver Call Type Triage / Clinical Caller Name Devin Harmon Relationship To Patient Spouse Return Phone Number [phone] (Primary) Chief Complaint BREATHING - shortness of breath or sounds breathless Reason for Call Symptomatic / Request for Devin Harmon states her husband is a chronic kidney patient. He is having tremors, and he cannot sleep at night. He is very agitated. His feet are swollen. He has shortness of breath. Translation No Nurse Assessment Nurse: Reasor, RN, Leah Date/Time (Eastern Time): 03/27/2019 9:12:42 AM Confirm and document reason for call. If symptomatic, describe symptoms. ---Caller states her husband is a chronic kidney patient but not dialysis. He is having tremors, and he cannot sleep at night. He is very agitated, feet are swollen, and SOB. Has the patient had close contact with a person known or suspected to have the novel coronavirus illness OR traveled / lives in area with major community spread (including international travel) in the last 14 days from the onset of symptoms? * If Asymptomatic, screen for exposure and travel within the last 14 days. ---No Does the patient have any new or worsening symptoms? ---Yes Will a triage be completed? ---Yes Related visit to physician within the last 2 weeks? ---No Does the PT have any chronic conditions? (i.e. diabetes, asthma, this includes High risk factors for pregnancy, etc.) ---Yes List chronic  conditions. ---kidney failure seasonal allergies HTN Is this a behavioral health or substance abuse call? ---No Guidelines Guideline Title Affirmed Question Affirmed Notes Nurse Date/Time (Eastern Time) Breathing Difficulty SEVERE difficulty breathing (e.g., struggling Reasor, RN, Denny Peon 03/27/2019 9:16:57 AM PLEASE NOTE: All timestamps contained within this report are represented as [place] Standard Time. CONFIDENTIALTY NOTICE: This fax transmission is intended only for the addressee. It contains information that is legally privileged, confidential or otherwise protected from use or disclosure. If you are not the intended recipient, you are strictly prohibited from reviewing, disclosing, copying using or disseminating any of this information or taking any action in reliance on or regarding this information. If you have received this fax in error, please notify us immediately by telephone so that we can arrange for its return to [place]. Phone: [phone], Toll-Free: [phone], Fax: [phone] Page: 2 of 2 Call Id: [id number] Guidelines Guideline Title Affirmed Question Affirmed Notes Nurse Date/Time Eilene Ghazi Time) for each breath, speaks in single words) Disp. Time Eilene Ghazi Time) Disposition Final User 03/27/2019 9:11:07 AM Send to Urgent Queue Izola Price 03/27/2019 9:24:45 AM 911 Outcome Documentation Reasor, RN, Denny Peon Reason: Callback to verify EMS notification - caller confirms EMS en route 03/27/2019 9:19:27 AM Call EMS 911 Now Yes Reasor, RN, Denny Peon Caller Disagree/Comply Comply Caller Understands Yes PreDisposition InappropriateToAsk Care Advice Given Per Guideline CALL EMS 911 NOW: CARE ADVICE given per Breathing Difficulty (Adult) guideline. Referrals GO TO FACILITY UNDECIDED

## 2019-03-28 NOTE — Telephone Encounter (Signed)
I will check in with his wife about his condition

## 2019-03-28 NOTE — Progress Notes (Signed)
CRITICAL CARE NOTE       SUBJECTIVE FINDINGS & SIGNIFICANT EVENTS    Removing heparin, eating renal po, on 1.2L fluid restriction. Sensorium improved.   PAST MEDICAL HISTORY   Past Medical History:  Diagnosis Date   Allergy    BPH (benign prostatic hypertrophy)    Hypertension    Renal insufficiency    [place] 2007   facial, hospital 2007     SURGICAL HISTORY   History reviewed. No pertinent surgical history.   FAMILY HISTORY   Family History  Problem Relation Age of Onset   Hypertension Mother    Cancer Mother        lung cancer   Hypertension Brother    Heart disease Father    Coronary artery disease Neg Hx    Diabetes Neg Hx      SOCIAL HISTORY   Social History   Tobacco Use   Smoking status: Never Smoker   Smokeless tobacco: Never Used  Substance Use Topics   Alcohol use: Yes    Comment: occasional   Drug use: Not Currently     MEDICATIONS   Current Medication:  Current Facility-Administered Medications:    0.9 %  sodium chloride infusion (Manually program via Guardrails IV Fluids), , Intravenous, Once, Tukov-Yual, Magdalene S, NP   0.9 %  sodium chloride infusion, 100 mL, Intravenous, PRN, Singh, Harmeet, MD   0.9 %  sodium chloride infusion, 100 mL, Intravenous, PRN, Candiss Norse, Harmeet, MD   acetaminophen (TYLENOL) tablet 650 mg, 650 mg, Oral, Q6H PRN **OR** acetaminophen (TYLENOL) suppository 650 mg, 650 mg, Rectal, Q6H PRN, Mayo, Pete Pelt, MD   alteplase (CATHFLO ACTIVASE) injection 2 mg, 2 mg, Intracatheter, Once PRN, Candiss Norse, Harmeet, MD   amLODipine (NORVASC) tablet 10 mg, 10 mg, Oral, QPM, Mayo, Pete Pelt, MD, 10 mg at 03/27/19 1751   calcium gluconate 1 g/ 50 mL sodium chloride IVPB, 1 g, Intravenous, Once, Mayo, Pete Pelt, MD   Chlorhexidine  Gluconate Cloth 2 % PADS 6 each, 6 each, Topical, Q0600, Candiss Norse, Harmeet, MD   diazepam (VALIUM) injection 5 mg, 5 mg, Intravenous, Q4H PRN, Tukov-Yual, Magdalene S, NP   feeding supplement (NEPRO CARB STEADY) liquid 237 mL, 237 mL, Oral, PRN, Candiss Norse, Harmeet, MD   fluticasone (FLONASE) 50 MCG/ACT nasal spray 1-2 spray, 1-2 spray, Each Nare, Daily, Mayo, Pete Pelt, MD, 2 spray at 03/27/19 2045   heparin injection 1,000 Units, 1,000 Units, Dialysis, PRN, Candiss Norse, Harmeet, MD   labetalol (NORMODYNE) tablet 100 mg, 100 mg, Oral, QHS, Mayo, Pete Pelt, MD, 100 mg at 03/27/19 2044   loratadine (CLARITIN) tablet 10 mg, 10 mg, Oral, Daily, Mayo, Pete Pelt, MD   MEDLINE mouth rinse, 15 mL, Mouth Rinse, q12n4p, Kasa, Kurian, MD, 15 mL at 03/27/19 1755   nitroGLYCERIN (NITROGLYN) 2 % ointment 1 inch, 1 inch, Topical, Q6H, Tukov-Yual, Magdalene S, NP, 1 inch at 03/28/19 0542   ondansetron (ZOFRAN) tablet 4 mg, 4 mg, Oral, Q6H PRN **OR** ondansetron (ZOFRAN) injection 4 mg, 4 mg, Intravenous, Q6H PRN, Mayo, Pete Pelt, MD   polyethylene glycol (MIRALAX / GLYCOLAX) packet 17 g, 17 g, Oral, Daily PRN, Mayo, Pete Pelt, MD   sodium bicarbonate tablet 650 mg, 650 mg, Oral, TID, Mayo, Pete Pelt, MD, 650 mg at 03/27/19 2044   tuberculin injection 5 Units, 5 Units, Intradermal, Once, Murlean Iba, MD, 5 Units at 03/27/19 2259    ALLERGIES   Chlorthalidone; Hydrocodone; and Sulfonamide derivatives    REVIEW OF SYSTEMS  10 point ROS conducted and is negative except as per subjective findings  PHYSICAL EXAMINATION   Vitals:   03/28/19 1036 03/28/19 1045  BP:  (!) 182/99  Pulse: 71 78  Resp: (!) 28 (!) 30  Temp:    SpO2: 98% 98%    GENERAL: Mild distress due to acutely ill status HEAD: Normocephalic, atraumatic.  EYES: Pupils equal, round, reactive to light.  No scleral icterus.  MOUTH: Moist mucosal membrane. NECK: Supple. No thyromegaly. No nodules. No JVD.  PULMONARY: Crackles at the  bases bilaterally CARDIOVASCULAR: S1 and S2. Regular rate and rhythm. No murmurs, rubs, or gallops.  GASTROINTESTINAL: Soft, nontender, non-distended. No masses. Positive bowel sounds. No hepatosplenomegaly.  MUSCULOSKELETAL: No swelling, clubbing, or edema.  NEUROLOGIC: Mild distress due to acute illness SKIN:intact,warm,dry   LABS AND IMAGING     -I personally reviewed most recent blood work, imaging and microbiology - significant findings today are ESRD, severe anemia  LAB RESULTS: Recent Labs  Lab 03/27/19 1040 03/27/19 2010 03/28/19 0515  NA 134* 138 138  K 5.4* 4.3 4.5  CL 103 101 102  CO2 12* 16* 20*  BUN 145* 92* 115*  CREATININE 18.91* 13.86* 15.20*  GLUCOSE 113* 102* 115*   Recent Labs  Lab 03/27/19 1040 03/27/19 2010 03/28/19 0515  HGB 8.0* 7.5* 6.8*  HCT 24.6* 22.5* 20.6*  WBC 11.0* 10.2 8.3  PLT 314 267 237     IMAGING RESULTS: Dg Chest Port 1 View  Result Date: 03/27/2019 CLINICAL DATA:  Central line placement EXAM: PORTABLE CHEST 1 VIEW COMPARISON:  03/27/2019, 08/27/2018 FINDINGS: Left-sided central venous catheter tip projects over the intra hepatic IVC. No left pneumothorax. Small bilateral pleural effusions. Slight worsening of bilateral interstitial and alveolar extensive airspace opacities. Stable cardiomediastinal silhouette with aortic atherosclerosis. IMPRESSION: 1. Left-sided central venous catheter tip overlies the intra hepatic IVC. No left pneumothorax. 2. Small pleural effusions. Slight worsening of extensive interstitial and alveolar opacities bilaterally. Electronically Signed   By: Donavan Foil M.D.   On: 03/27/2019 15:48      ASSESSMENT AND PLAN    -Multidisciplinary rounds held today  Acute Hypoxic Respiratory Failure         Due to pulmonary edema from both dialysis noncompliance and acute decompensated diastolic CHF EF 09-32%  - BNP - 3985 .0 Net 1L negative during day shift today  -continue Bronchodilator Therapy -follow up  cardiac enzymes as indicated ICU monitoring   Severe anemia   -No signs of grossly apparent bleeding/hematemesis/hematochezia  -Likely due to anemia of chronic disease/renal failure  -We will discuss Epogen with nephrology   Renal Failure-most likely ESRD  -Nephrology on case-appreciate input -For ultrasound today -follow chem 7 -continue Foley Catheter-assess need daily    GI/Nutrition GI PROPHYLAXIS as indicated DIET-->TF's as tolerated Constipation protocol as indicated  ENDO - ICU hypoglycemic\Hyperglycemia protocol -check FSBS per protocol   ELECTROLYTES -follow labs as needed -replace as needed -pharmacy consultation   DVT/GI PRX ordered -SCDs  TRANSFUSIONS AS NEEDED MONITOR FSBS ASSESS the need for LABS as needed   Critical care provider statement:    Critical care time (minutes):  35   Critical care time was exclusive of:  Separately billable procedures and treating other patients   Critical care was necessary to treat or prevent imminent or life-threatening deterioration of the following conditions:   Acute hypoxemic respiratory failure, pulmonary edema, ESRD, emergent dialysis, tensional hypertension   Critical care was time spent personally by me on the following activities:  Development of treatment plan with patient or surrogate, discussions with consultants, evaluation of patient's response to treatment, examination of patient, obtaining history from patient or surrogate, ordering and performing treatments and interventions, ordering and review of laboratory studies and re-evaluation of patient's condition.  I assumed direction of critical care for this patient from another provider in my specialty: no    This document was prepared using Dragon voice recognition software and may include unintentional dictation errors.    Ottie Glazier, M.D.  Division of [facility]

## 2019-03-28 NOTE — Progress Notes (Signed)
Post HD TX    03/28/19 1000  Hand-Off documentation  Report given to (Full Name) Deitra Mayo, RN   Report received from (Full Name) Beatris Ship, RN   Vital Signs  Temp 97.6 F (36.4 C)  Temp Source Oral  Pulse Rate 74  Pulse Rate Source Monitor  Resp (!) 27  BP (!) 177/110  BP Location Right Arm  BP Method Automatic  Patient Position (if appropriate) Lying  Oxygen Therapy  SpO2 100 %  O2 Device Nasal Cannula  Pulse Oximetry Type Continuous  Pain Assessment  Pain Scale 0-10  Pain Score 0  Post-Hemodialysis Assessment  Rinseback Volume (mL) 250 mL  KECN 34.6 V  Dialyzer Clearance Lightly streaked  Duration of HD Treatment -hour(s) 2.5 hour(s)  Hemodialysis Intake (mL) 500 mL  UF Total -Machine (mL) 1501 mL  Net UF (mL) 1001 mL  Tolerated HD Treatment Yes  Hemodialysis Catheter  Placement Date: 03/27/19    Site Condition No complications  Blue Lumen Status Heparin locked  Red Lumen Status Heparin locked  Post treatment catheter status Capped and Clamped

## 2019-03-29 ENCOUNTER — Other Ambulatory Visit: Payer: Self-pay

## 2019-03-29 LAB — BASIC METABOLIC PANEL WITH GFR
Anion gap: 14 (ref 5–15)
BUN: 87 mg/dL — ABNORMAL HIGH (ref 8–23)
CO2: 23 mmol/L (ref 22–32)
Calcium: 7.3 mg/dL — ABNORMAL LOW (ref 8.9–10.3)
Chloride: 105 mmol/L (ref 98–111)
Creatinine, Ser: 11.48 mg/dL — ABNORMAL HIGH (ref 0.61–1.24)
GFR calc Af Amer: 5 mL/min — ABNORMAL LOW (ref >60)
GFR calc non Af Amer: 4 mL/min — ABNORMAL LOW (ref >60)
Glucose, Bld: 108 mg/dL — ABNORMAL HIGH (ref 70–99)
Potassium: 4.2 mmol/L (ref 3.5–5.1)
Sodium: 142 mmol/L (ref 135–145)

## 2019-03-29 LAB — BPAM RBC
Blood Product Expiration Date: 202005012359
ISSUE DATE / TIME: 202004271030
Unit Type and Rh: 5100

## 2019-03-29 LAB — CBC WITH DIFFERENTIAL/PLATELET
Abs Immature Granulocytes: 0.12 K/uL — ABNORMAL HIGH (ref 0.00–0.07)
Basophils Absolute: 0.1 K/uL (ref 0.0–0.1)
Basophils Relative: 1 %
Eosinophils Absolute: 0.3 K/uL (ref 0.0–0.5)
Eosinophils Relative: 3 %
HCT: 25.6 % — ABNORMAL LOW (ref 39.0–52.0)
Hemoglobin: 8.5 g/dL — ABNORMAL LOW (ref 13.0–17.0)
Immature Granulocytes: 1 %
Lymphocytes Relative: 10 %
Lymphs Abs: 0.9 K/uL (ref 0.7–4.0)
MCH: 27.5 pg (ref 26.0–34.0)
MCHC: 33.2 g/dL (ref 30.0–36.0)
MCV: 82.8 fL (ref 80.0–100.0)
Monocytes Absolute: 0.8 K/uL (ref 0.1–1.0)
Monocytes Relative: 9 %
Neutro Abs: 6.6 K/uL (ref 1.7–7.7)
Neutrophils Relative %: 76 %
Platelets: 234 K/uL (ref 150–400)
RBC: 3.09 MIL/uL — ABNORMAL LOW (ref 4.22–5.81)
RDW: 16.2 % — ABNORMAL HIGH (ref 11.5–15.5)
WBC: 8.7 K/uL (ref 4.0–10.5)
nRBC: 0 % (ref 0.0–0.2)

## 2019-03-29 LAB — HEPATITIS B CORE ANTIBODY, TOTAL: Hep B Core Total Ab: NEGATIVE

## 2019-03-29 LAB — TYPE AND SCREEN
ABO/RH(D): B POS
Antibody Screen: NEGATIVE
Unit division: 0

## 2019-03-29 LAB — HEPATITIS B SURFACE ANTIBODY,QUALITATIVE: Hep B S Ab: NONREACTIVE

## 2019-03-29 LAB — HEPATITIS C ANTIBODY: HCV Ab: <0.1 s/co ratio (ref 0.0–0.9)

## 2019-03-29 LAB — HEPATITIS B SURFACE ANTIGEN: Hepatitis B Surface Ag: NEGATIVE

## 2019-03-29 LAB — PHOSPHORUS: Phosphorus: 6.4 mg/dL — ABNORMAL HIGH (ref 2.5–4.6)

## 2019-03-29 LAB — MAGNESIUM: Magnesium: 1.8 mg/dL (ref 1.7–2.4)

## 2019-03-29 MED ORDER — MINOXIDIL 2.5 MG PO TABS
5.0000 mg | ORAL_TABLET | Freq: Every day | ORAL | Status: DC
  Administered 2019-03-29: 5 mg via ORAL
  Filled 2019-03-29 (×2): qty 2

## 2019-03-29 MED ORDER — RENA-VITE PO TABS
1.0000 | ORAL_TABLET | Freq: Every day | ORAL | Status: DC
  Administered 2019-03-29: 1 via ORAL
  Filled 2019-03-29 (×2): qty 1

## 2019-03-29 MED ORDER — NEPRO/CARBSTEADY PO LIQD
237.0000 mL | Freq: Two times a day (BID) | ORAL | Status: DC
  Administered 2019-03-29 – 2019-03-30 (×2): 237 mL via ORAL

## 2019-03-29 NOTE — Progress Notes (Signed)
   03/29/19 1600  Clinical Encounter Type  Visited With Family  Visit Type Follow-up;Psychological support;Spiritual support;Social support  Referral From Physician  Consult/Referral To Chaplain  Recommendations claarity of exit plan   Spiritual Encounters  Spiritual Needs Prayer;Emotional;Grief support  Stress Factors  Patient Stress Factors Health changes;Exhausted;Lack of knowledge;Loss;Loss of control;Major life changes  Family Stress Factors Major life changes;Loss of control;Lack of knowledge  Advance Directives (For Healthcare)  Does Patient Have a Medical Advance Directive? No  Would patient like information on creating a medical advance directive? No - Patient declined  Ch visited w/ pt to shared that we were not able to fulfill his request for an AD. Pt understood but stated his wife would make decisions on his behalf when needed. Pt presented to hv full mental capacity. Ch educated pt on the difference between a living will and HPOA. Pt declined at this time. Ch noticed the room was very cold and offered to turn up the thermostat for pt. Pt also asked for a blanket which the ch provided for him and also helped to turn on the TV for him. Pt shared that he was ready to go home. Ch shared with pt that he needed to have an exit plan in place. Pt may likely need to hv a permit port in place for dialysis. Pt shared that he was open to having dialysis once he is d/c. This was something that the ch discussed with his wife also. Both ch and wife agreed that it should be his decision and that his quality of life was more important. Ch prayed w/ wife over the phone. Ch offered to call pt's wife so that he could speak w/ her. Pt declined and shared that he will talk to her if it was time to go. Ch understood that the pt was eager to be discharged but provided words of comfort and compassionate presence. Pt appreciated the visit.

## 2019-03-29 NOTE — Progress Notes (Signed)
The patient is not schedule for permcath placement tomorrow. Dr. Lucky Cowboy indicated the patient is not in his schedule.

## 2019-03-29 NOTE — Progress Notes (Signed)
Post HD Assessment    03/29/19 1220  Neurological  Level of Consciousness Alert  Orientation Level Oriented X4  Respiratory  Respiratory Pattern Regular;Unlabored  Chest Assessment Chest expansion symmetrical  Bilateral Breath Sounds Clear  Cough None  Cardiac  Pulse Regular  Heart Sounds S1, S2  ECG Monitor Yes  Cardiac Rhythm NSR  Vascular  R Radial Pulse +2  L Radial Pulse +2  Edema Generalized  Generalized Edema +1  Psychosocial  Psychosocial (WDL) WDL  Patient Behaviors Calm;Cooperative  Emotional support given Given to patient

## 2019-03-29 NOTE — Progress Notes (Signed)
[facility]  ROUNDING NOTE   Subjective:  Patient due for third dialysis treatment today. We will consult with vascular surgery for PermCath placement tomorrow. In addition outpatient dialysis center placement pending.   Objective:  Vital signs in last 24 hours:  Temp:  [97.6 F (36.4 C)-98.3 F (36.8 C)] 98.1 F (36.7 C) (04/28 0900) Pulse Rate:  [71-92] 77 (04/28 0930) Resp:  [23-40] 34 (04/28 0930) BP: (154-190)/(55-118) 160/94 (04/28 0930) SpO2:  [90 %-100 %] 99 % (04/28 0930) Weight:  [91 kg] 91 kg (04/28 0900)  Weight change:  Filed Weights   03/27/19 1830 03/28/19 0700 03/29/19 0900  Weight: 91.5 kg 91.7 kg 91 kg    Intake/Output: I/O last 3 completed shifts: In: 450 [P.O.:450] Out: 1651 [Urine:650; Other:1001]   Intake/Output this shift:  No intake/output data recorded.  Physical Exam: General: No acute distress  Head: Normocephalic, atraumatic. Moist oral mucosal membranes  Eyes: Anicteric  Neck: Supple, trachea midline  Lungs:  Basilar rales, normal effort  Heart: S1S2 no obvious rub  Abdomen:  Soft, nontender, bowel sounds present  Extremities: traceperipheral edema.  Neurologic: Awake, but confused  Skin: No lesions  Access: Left internal jugular temporary dialysis catheter    Basic Metabolic Panel: Recent Labs  Lab 03/27/19 1040 03/27/19 1727 03/27/19 2010 03/28/19 0515 03/29/19 0515  NA 134*  --  138 138 142  K 5.4*  --  4.3 4.5 4.2  CL 103  --  101 102 105  CO2 12*  --  16* 20* 23  GLUCOSE 113*  --  102* 115* 108*  BUN 145*  --  92* 115* 87*  CREATININE 18.91*  --  13.86* 15.20* 11.48*  CALCIUM 6.7*  --  7.5* 7.1* 7.3*  MG 2.0  --  1.8 2.0 1.8  PHOS  --  7.6* 7.3* 9.1* 6.4*    Liver Function Tests: Recent Labs  Lab 03/27/19 1040 03/27/19 2010 03/28/19 1135  AST 10* 11* 21  ALT 10 10 12   ALKPHOS 55 58 51  BILITOT 0.6 1.0 0.9  PROT 7.4 6.7 6.5  ALBUMIN 3.9 3.8 3.3*   No results for input(s): LIPASE, AMYLASE  in the last 168 hours. Recent Labs  Lab 03/28/19 1134  AMMONIA 15    CBC: Recent Labs  Lab 03/27/19 1040 03/27/19 2010 03/28/19 0515 03/28/19 1610 03/29/19 0515  WBC 11.0* 10.2 8.3  --  8.7  NEUTROABS 9.2*  --   --   --  6.6  HGB 8.0* 7.5* 6.8* 8.3* 8.5*  HCT 24.6* 22.5* 20.6* 24.8* 25.6*  MCV 82.0 80.4 81.1  --  82.8  PLT 314 267 237  --  234    Cardiac Enzymes: Recent Labs  Lab 03/27/19 2010  TROPONINI 0.05*    BNP: Invalid input(s): POCBNP  CBG: Recent Labs  Lab 03/27/19 [place]*    Microbiology: Results for orders placed or performed during the hospital encounter of 03/27/19  SARS Coronavirus 2 [facility] order, Performed in [place] hospital lab)     Status: None   Collection Time: 03/27/19 10:55 AM  Result Value Ref Range Status   SARS Coronavirus 2 NEGATIVE NEGATIVE Final    Comment: (NOTE) If result is NEGATIVE SARS-CoV-2 target nucleic acids are NOT DETECTED. The SARS-CoV-2 RNA is generally detectable in upper and lower  respiratory specimens during the acute phase of infection. The lowest  concentration of SARS-CoV-2 viral copies this assay can detect is 250  copies / mL. A  negative result does not preclude SARS-CoV-2 infection  and should not be used as the sole basis for treatment or other  patient management decisions.  A negative result may occur with  improper specimen collection / handling, submission of specimen other  than nasopharyngeal swab, presence of viral mutation(s) within the  areas targeted by this assay, and inadequate number of viral copies  (<250 copies / mL). A negative result must be combined with clinical  observations, patient history, and epidemiological information. If result is POSITIVE SARS-CoV-2 target nucleic acids are DETECTED. The SARS-CoV-2 RNA is generally detectable in upper and lower  respiratory specimens dur ing the acute phase of infection.  Positive  results are indicative of active infection  with SARS-CoV-2.  Clinical  correlation with patient history and other diagnostic information is  necessary to determine patient infection status.  Positive results do  not rule out bacterial infection or co-infection with other viruses. If result is PRESUMPTIVE POSTIVE SARS-CoV-2 nucleic acids MAY BE PRESENT.   A presumptive positive result was obtained on the submitted specimen  and confirmed on repeat testing.  While 2019 novel coronavirus  (SARS-CoV-2) nucleic acids may be present in the submitted sample  additional confirmatory testing may be necessary for epidemiological  and / or clinical management purposes  to differentiate between  SARS-CoV-2 and other Sarbecovirus currently known to infect humans.  If clinically indicated additional testing with an alternate test  methodology [id number]) is advised. The SARS-CoV-2 RNA is generally  detectable in upper and lower respiratory sp ecimens during the acute  phase of infection. The expected result is Negative. Fact Sheet for Patients:  StrictlyIdeas.no Fact Sheet for Healthcare Providers: BankingDealers.co.za This test is not yet approved or cleared by the [place] FDA and has been authorized for detection and/or diagnosis of SARS-CoV-2 by FDA under an Emergency Use Authorization (EUA).  This EUA will remain in effect (meaning this test can be used) for the duration of the COVID-19 declaration under Section 564(b)(1) of the Act, 21 U.S.C. section 360bbb-3(b)(1), unless the authorization is terminated or revoked sooner. Performed at [facility], [address] [place], [place] [address]   Blood Culture (routine x 2)     Status: None (Preliminary result)   Collection Time: 03/27/19 10:56 AM  Result Value Ref Range Status   Specimen Description BLOOD RFA  Final   Special Requests   Final    BOTTLES DRAWN AEROBIC AND ANAEROBIC Blood Culture results may not be optimal due  to an excessive volume of blood received in culture bottles   Culture   Final    NO GROWTH 2 DAYS Performed at [facility], [address], [place], [place] [address]    Report Status PENDING  Incomplete  Blood Culture (routine x 2)     Status: None (Preliminary result)   Collection Time: 03/27/19 11:00 AM  Result Value Ref Range Status   Specimen Description BLOOD RH  Final   Special Requests   Final    BOTTLES DRAWN AEROBIC AND ANAEROBIC Blood Culture adequate volume   Culture   Final    NO GROWTH 2 DAYS Performed at [facility] [address] [place], [place] [address]    Report Status PENDING  Incomplete  MRSA PCR Screening     Status: None   Collection Time: 03/27/19  2:41 PM  Result Value Ref Range Status   MRSA by PCR NEGATIVE NEGATIVE Final    Comment:  The GeneXpert MRSA Assay (FDA approved for NASAL specimens only), is one component of a comprehensive MRSA colonization surveillance program. It is not intended to diagnose MRSA infection nor to guide or monitor treatment for MRSA infections. Performed at [facility], [address] [place], [place] [address]     Coagulation Studies: Recent Labs    03/27/19 1040  LABPROT 14.9  INR 1.2    Urinalysis: No results for input(s): COLORURINE, LABSPEC, PHURINE, GLUCOSEU, HGBUR, BILIRUBINUR, KETONESUR, PROTEINUR, UROBILINOGEN, NITRITE, LEUKOCYTESUR in the last 72 hours.  Invalid input(s): APPERANCEUR    Imaging: US Renal  Result Date: 03/28/2019 CLINICAL DATA:  End-stage renal disease. EXAM: RENAL / URINARY TRACT ULTRASOUND COMPLETE COMPARISON:  CT abdomen pelvis dated August 28, 2018. FINDINGS: Right Kidney: Renal measurements: 8.7 x 3.7 x 4.0 cm = volume: 68 mL. Increased echogenicity. No mass or hydronephrosis visualized. Small 1.1 cm exophytic simple cyst arising from the midpole. Left Kidney: Renal measurements: 8.1 x 5.7 x 4.9 cm = volume: 1 at the mL. Increased  echogenicity. No mass or hydronephrosis visualized. Bladder: Appears normal for degree of bladder distention. IMPRESSION: 1. Bilateral increased renal echogenicity, consistent with medical renal disease. No acute abnormality. Electronically Signed   By: Titus Dubin M.D.   On: 03/28/2019 17:04   Dg Chest Port 1 View  Result Date: 03/27/2019 CLINICAL DATA:  Central line placement EXAM: PORTABLE CHEST 1 VIEW COMPARISON:  03/27/2019, 08/27/2018 FINDINGS: Left-sided central venous catheter tip projects over the intra hepatic IVC. No left pneumothorax. Small bilateral pleural effusions. Slight worsening of bilateral interstitial and alveolar extensive airspace opacities. Stable cardiomediastinal silhouette with aortic atherosclerosis. IMPRESSION: 1. Left-sided central venous catheter tip overlies the intra hepatic IVC. No left pneumothorax. 2. Small pleural effusions. Slight worsening of extensive interstitial and alveolar opacities bilaterally. Electronically Signed   By: Donavan Foil M.D.   On: 03/27/2019 15:48   Dg Chest Portable 1 View  Result Date: 03/27/2019 CLINICAL DATA:  Shortness of breath.  Hypoxia. EXAM: PORTABLE CHEST 1 VIEW COMPARISON:  August 27, 2017 FINDINGS: Bilateral patchy infiltrates are identified. Stable cardiomegaly. The hila and mediastinum are normal. No pneumothorax. No other acute abnormalities. IMPRESSION: Bilateral patchy infiltrates may represent multifocal pneumonia versus pulmonary edema. Recommend clinical correlation. Electronically Signed   By: Dorise Bullion III M.D   On: 03/27/2019 12:06     Medications:   . sodium chloride    . sodium chloride    . calcium gluconate     . amLODipine  10 mg Oral QPM  . Chlorhexidine Gluconate Cloth  6 each Topical Q0600  . fluticasone  1-2 spray Each Nare Daily  . labetalol  100 mg Oral QHS  . loratadine  10 mg Oral Daily  . mouth rinse  15 mL Mouth Rinse q12n4p  . sodium bicarbonate  650 mg Oral TID  . tuberculin  5  Units Intradermal Once   sodium chloride, sodium chloride, acetaminophen **OR** acetaminophen, alteplase, diazepam, feeding supplement (NEPRO CARB STEADY), heparin, hydrALAZINE, ondansetron **OR** ondansetron (ZOFRAN) IV, polyethylene glycol  Assessment/ Plan:  63 y.o. male  with hypertension, BPH, advanced CKD, was admitted on 03/27/2019 with uremia and acute pulmonary edema.   1.  End-stage renal disease 2.  Acute pulmonary edema 3.  Severe hypertension 4.  Lower extremity edema 5.  Severe acidosis. 6.  Anemia, likely of chronic kidney disease 7.  2D echo from September 2019 show moderate concentric left ventricular hypertrophy, LVEF 55 to 66%, grade 1 diastolic dysfunction, severely  dilated left atrium  Plan: Patient appears to be tolerating initiation of dialysis well.  He is due for his third dialysis treatment today.  We will request vascular surgery consultation for PermCath placement for tomorrow.  His acute pulmonary edema appears to have improved significantly with the initiation of dialysis.  We will begin the process of outpatient hemodialysis center placement.  Further plan as patient progresses.   LOS: 2 Samary Shatz 4/28/20209:43 AM

## 2019-03-29 NOTE — Progress Notes (Signed)
HD Tx started w/o complication, this is Pt's 3rd HD Tx and is tolerating well.    03/29/19 0902  Vital Signs  Pulse Rate 83  Resp (!) 30  BP (!) 189/97  Oxygen Therapy  SpO2 97 %  During Hemodialysis Assessment  Blood Flow Rate (mL/min) 300 mL/min  Arterial Pressure (mmHg) -160 mmHg  Venous Pressure (mmHg) 140 mmHg  Transmembrane Pressure (mmHg) 60 mmHg  Ultrafiltration Rate (mL/min) 500 mL/min  Dialysate Flow Rate (mL/min) 600 ml/min  Conductivity: Machine  14.3  HD Safety Checks Performed Yes  Dialysis Fluid Bolus Normal Saline  Bolus Amount (mL) 250 mL  Intra-Hemodialysis Comments Tx initiated

## 2019-03-29 NOTE — Progress Notes (Signed)
Patient slept for the majority of the shift, refused bath, refused SCD's and repeatedly he wishes to go home. Patient is calm and pleasant when refusing. Patient is alert and oriented.

## 2019-03-29 NOTE — Progress Notes (Signed)
Spoke with patient today in dialysis, patient gave permission to speak and make discharge to dialysis plans with wife. Spoke with patient wife, wife stated " I want everyone to understand that this is a temporary thing, until my husband can make a decision on his own to continue dialysis." DC explained that getting a chair time will help not delay a discharge and that it had no impact on what his decision would be later. Wife understood and gave permission to sent referral to [place] for dialysis.

## 2019-03-29 NOTE — Progress Notes (Signed)
Post HD Tx    03/29/19 1215  Hand-Off documentation  Report given to (Full Name) Cleotis Lema, RN   Report received from (Full Name) Beatris Ship, RN   Vital Signs  Temp 98.1 F (36.7 C)  Temp Source Oral  Pulse Rate 84  Pulse Rate Source Monitor  Resp 16  BP (!) 184/103  BP Location Right Arm  BP Method Automatic  Patient Position (if appropriate) Lying  Oxygen Therapy  O2 Device Room Air  Pain Assessment  Pain Scale 0-10  Pain Score 0  Dialysis Weight  Weight 89.7 kg  Type of Weight Post-Dialysis  Post-Hemodialysis Assessment  Rinseback Volume (mL) 250 mL  KECN 58.6 V  Dialyzer Clearance Lightly streaked  Duration of HD Treatment -hour(s) 3 hour(s)  Hemodialysis Intake (mL) 500 mL  UF Total -Machine (mL) 1506 mL  Net UF (mL) 1006 mL  Tolerated HD Treatment Yes  Hemodialysis Catheter  Placement Date: 03/27/19    Site Condition No complications  Post treatment catheter status Capped and Clamped

## 2019-03-29 NOTE — Progress Notes (Signed)
Pre HD Tx   03/29/19 0900  Hand-Off documentation  Report given to (Full Name) Charlie  Vital Signs  Temp 98.1 F (36.7 C)  Temp Source Oral  Pulse Rate 78  Pulse Rate Source Monitor  Resp (!) 34  BP (!) 174/95  BP Location Right Arm  BP Method Automatic  Patient Position (if appropriate) Lying  Oxygen Therapy  SpO2 95 %  O2 Device Room Air  Pain Assessment  Pain Scale 0-10  Pain Score 0  Dialysis Weight  Weight 91 kg  Type of Weight Pre-Dialysis  Time-Out for Hemodialysis  What Procedure? HD  Pt Identifiers(min of two) First/Last Name;MRN/Account#  Correct Site? Yes  Correct Side? Yes  Correct Procedure? Yes  Consents Verified? Yes  Rad Studies Available? N/A  Safety Precautions Reviewed? Yes  Engineer, civil (consulting) Number 2  Station Number 1  UF/Alarm Test Passed  Conductivity: Meter 14.2  Conductivity: Machine  14.3  pH 7.4  Reverse Osmosis Main  Normal Saline Lot Number [id number]  Dialyzer Lot Number [id number]  Disposable Set Lot Number [id number]  Machine Temperature 98.6 F (37 C)  Blood Lines Intact and Secured Yes  Pre Treatment Patient Checks  Vascular access used during treatment Catheter  Hepatitis B Surface Antigen Results Negative  Date Hepatitis B Surface Antigen Drawn 03/27/19  Hepatitis B Surface Antibody  (<10)  Date Hepatitis B Surface Antibody Drawn 03/27/19  Hemodialysis Consent Verified Yes  Hemodialysis Standing Orders Initiated Yes  ECG (Telemetry) Monitor On Yes  Prime Ordered Normal Saline  Length of  DialysisTreatment -hour(s) 3 Hour(s)  Dialysis Treatment Comments Na 140  Dialyzer Elisio 17H NR  Dialysate 3K, 2.5 Ca  Dialysis Anticoagulant None  Dialysate Flow Ordered 600  Blood Flow Rate Ordered 300 mL/min  Ultrafiltration Goal 1 Liters  Pre Treatment Labs Phosphorus  Dialysis Blood Pressure Support Ordered Normal Saline  Education / Care Plan  Dialysis Education Provided Yes  Documented Education in Care Plan Yes   Hemodialysis Catheter  Placement Date: 03/27/19    Site Condition No complications  Blue Lumen Status Blood return noted  Red Lumen Status Blood return noted  Purple Lumen Status Capped (Central line)  Dressing Type Biopatch;Gauze/Drain sponge;Occlusive  Dressing Status Clean;Dry;Intact

## 2019-03-29 NOTE — Progress Notes (Signed)
HD Tx completed tolerated well, UF goal met, Pt seen by Dialysis coordinator during [facility].    03/29/19 1203  Vital Signs  Pulse Rate 78  Pulse Rate Source Monitor  Resp 18  BP (!) 177/109  BP Location Right Arm  BP Method Automatic  Patient Position (if appropriate) Lying  During Hemodialysis Assessment  HD Safety Checks Performed Yes  KECN 58.6 KECN  Dialysis Fluid Bolus Normal Saline  Bolus Amount (mL) 250 mL  Intra-Hemodialysis Comments Tx completed;Tolerated well

## 2019-03-29 NOTE — Progress Notes (Signed)
Pre HD assessment    03/29/19 0900  Neurological  Level of Consciousness Alert  Orientation Level Oriented X4  Respiratory  Respiratory Pattern Regular;Unlabored  Chest Assessment Chest expansion symmetrical  Bilateral Breath Sounds Clear  Cough None  Cardiac  Pulse Regular  Heart Sounds S1, S2  ECG Monitor Yes  Cardiac Rhythm NSR  Vascular  R Radial Pulse +2  L Radial Pulse +2  Edema Generalized  Generalized Edema +1  Psychosocial  Psychosocial (WDL) WDL  Patient Behaviors Calm;Cooperative  Emotional support given Given to patient

## 2019-03-29 NOTE — Progress Notes (Signed)
[place] [facility] [place] NAME: Devin Harmon    MR#:  [id number]  DATE OF BIRTH:  [birth year]  SUBJECTIVE:   Patient came in with increasing shortness of breath. Started on hemodialysis - feeling better. Generalized weakness. REVIEW OF SYSTEMS:   Review of Systems  Constitutional: Negative for chills, fever and weight loss.  HENT: Negative for ear discharge, ear pain and nosebleeds.   Eyes: Negative for blurred vision, pain and discharge.  Respiratory: Positive for shortness of breath. Negative for sputum production, wheezing and stridor.   Cardiovascular: Negative for chest pain, palpitations, orthopnea and PND.  Gastrointestinal: Negative for abdominal pain, diarrhea, nausea and vomiting.  Genitourinary: Negative for frequency and urgency.  Musculoskeletal: Negative for back pain and joint pain.  Neurological: Negative for sensory change, speech change, focal weakness and weakness.  Psychiatric/Behavioral: Negative for depression and hallucinations. The patient is not nervous/anxious.    Tolerating Diet:yes Tolerating PT: pending  DRUG ALLERGIES:   Allergies  Allergen Reactions  . Chlorthalidone     REACTION: felt "bad" and ED  . Hydrocodone Other (See Comments)    Nausea and dizzy  . Sulfonamide Derivatives     REACTION: unspecified    VITALS:  Blood pressure (!) 167/60, pulse 87, temperature 98.6 F (37 C), temperature source Oral, resp. rate (!) 26, height 5\' 9"  (1.753 m), weight 89.7 kg, SpO2 93 %.  PHYSICAL EXAMINATION:   Physical Exam  GENERAL:  63 y.o.-year-old patient lying in the bed with no acute distress. Chronically ill EYES: Pupils equal, round, reactive to light and accommodation. No scleral icterus. Extraocular muscles intact.  HEENT: Head atraumatic, normocephalic. Oropharynx and nasopharynx clear.  NECK:  Supple, no jugular venous distention. No thyroid enlargement, no tenderness.  LUNGS: Normal breath  sounds bilaterally, no wheezing, rales, rhonchi. No use of accessory muscles of respiration.  HD cath+ CARDIOVASCULAR: S1, S2 normal. No murmurs, rubs, or gallops.  ABDOMEN: Soft, nontender, nondistended. Bowel sounds present. No organomegaly or mass.  EXTREMITIES: No cyanosis, clubbing or edema b/l.    NEUROLOGIC: Cranial nerves II through XII are intact. No focal Motor or sensory deficits b/l.   PSYCHIATRIC:  patient is alert and oriented x 3.  SKIN: No obvious rash, lesion, or ulcer.   LABORATORY PANEL:  CBC Recent Labs  Lab 03/29/19 0515  WBC 8.7  HGB 8.5*  HCT 25.6*  PLT 234    Chemistries  Recent Labs  Lab 03/28/19 1135 03/29/19 0515  NA  --  142  K  --  4.2  CL  --  105  CO2  --  23  GLUCOSE  --  108*  BUN  --  87*  CREATININE  --  11.48*  CALCIUM  --  7.3*  MG  --  1.8  AST 21  --   ALT 12  --   ALKPHOS 51  --   BILITOT 0.9  --    Cardiac Enzymes Recent Labs  Lab 03/27/19 2010  TROPONINI 0.05*   RADIOLOGY:  US Renal  Result Date: 03/28/2019 CLINICAL DATA:  End-stage renal disease. EXAM: RENAL / URINARY TRACT ULTRASOUND COMPLETE COMPARISON:  CT abdomen pelvis dated August 28, 2018. FINDINGS: Right Kidney: Renal measurements: 8.7 x 3.7 x 4.0 cm = volume: 68 mL. Increased echogenicity. No mass or hydronephrosis visualized. Small 1.1 cm exophytic simple cyst arising from the midpole. Left Kidney: Renal measurements: 8.1 x 5.7 x 4.9 cm = volume: 1 at the mL. Increased  echogenicity. No mass or hydronephrosis visualized. Bladder: Appears normal for degree of bladder distention. IMPRESSION: 1. Bilateral increased renal echogenicity, consistent with medical renal disease. No acute abnormality. Electronically Signed   By: Titus Dubin M.D.   On: 03/28/2019 17:04   Dg Chest Port 1 View  Result Date: 03/27/2019 CLINICAL DATA:  Central line placement EXAM: PORTABLE CHEST 1 VIEW COMPARISON:  03/27/2019, 08/27/2018 FINDINGS: Left-sided central venous catheter tip  projects over the intra hepatic IVC. No left pneumothorax. Small bilateral pleural effusions. Slight worsening of bilateral interstitial and alveolar extensive airspace opacities. Stable cardiomediastinal silhouette with aortic atherosclerosis. IMPRESSION: 1. Left-sided central venous catheter tip overlies the intra hepatic IVC. No left pneumothorax. 2. Small pleural effusions. Slight worsening of extensive interstitial and alveolar opacities bilaterally. Electronically Signed   By: Donavan Foil M.D.   On: 03/27/2019 15:48   ASSESSMENT AND PLAN:   Devin Harmon  is a 63 y.o. male with a known history of hypertension and end-stage renal disease who presented to the ED with progressively worsening shortness of breath over the last couple of days.  He is unable to lay flat  *Acute hypoxic respiratory failure secondary to pulmonary edema in the setting of ESRD- requiring high flow nasal cannula in the ED. No fevers, chills, cough to suggest pneumonia. -COVID testing negative -Patient now started on HD -Wean O2 as able  *ESRD- new HD patient. -s/p temporary HD catheter placement -Vascular consult for PermCath tomorrow -Nephrology consult -renal US CKD changes  *Hyperkalemia- K 5.4, due to above -Given calcium gluconate in the ED -improved  *Hypertension-blood pressures elevated in the ED, due to volume overload. -Continue home Norvasc, labetalol, terazosin  *Anemia of chronic kidney disease- hemoglobin 8.0, ferritin 517 -Management per nephrology   CODE STATUS: partial  DVT Prophylaxis: heparin  TOTAL TIME TAKING CARE OF THIS PATIENT: *30* minutes.  >50% time spent on counselling and coordination of care  POSSIBLE D/C IN *1-2* DAYS, DEPENDING ON CLINICAL CONDITION.  Note: This dictation was prepared with Dragon dictation along with smaller phrase technology. Any transcriptional errors that result from this process are unintentional.  Fritzi Mandes M.D on 03/29/2019 at 2:52  PM  Between 7am to 6pm - Pager - [phone]  After 6pm go to [email or web address] - password [email or web address] [facility]  Office  [phone]  CC: Primary care physician; Venia Carbon, MDPatient ID: Devin Harmon, male   DOB: March 12, 1956, 63 y.o.   MRN: [id number]

## 2019-03-29 NOTE — Progress Notes (Signed)
CRITICAL CARE NOTE      CHIEF COMPLAINT:   Acute hypoxemic respiratory failure   SUBJECTIVE FINDINGS & SIGNIFICANT EVENTS   Got HD this am, mentation improved now alert/oriented.  minoxidil today Prognosis is guarded   PAST MEDICAL HISTORY   Past Medical History:  Diagnosis Date   Allergy    BPH (benign prostatic hypertrophy)    Hypertension    Renal insufficiency    [place] 2007   facial, hospital 2007     SURGICAL HISTORY   History reviewed. No pertinent surgical history.   FAMILY HISTORY   Family History  Problem Relation Age of Onset   Hypertension Mother    Cancer Mother        lung cancer   Hypertension Brother    Heart disease Father    Coronary artery disease Neg Hx    Diabetes Neg Hx      SOCIAL HISTORY   Social History   Tobacco Use   Smoking status: Never Smoker   Smokeless tobacco: Never Used  Substance Use Topics   Alcohol use: Yes    Comment: occasional   Drug use: Not Currently     MEDICATIONS   Current Medication:  Current Facility-Administered Medications:    0.9 %  sodium chloride infusion, 100 mL, Intravenous, PRN, Singh, Harmeet, MD   0.9 %  sodium chloride infusion, 100 mL, Intravenous, PRN, Candiss Norse, Harmeet, MD   acetaminophen (TYLENOL) tablet 650 mg, 650 mg, Oral, Q6H PRN **OR** acetaminophen (TYLENOL) suppository 650 mg, 650 mg, Rectal, Q6H PRN, Mayo, Pete Pelt, MD   alteplase (CATHFLO ACTIVASE) injection 2 mg, 2 mg, Intracatheter, Once PRN, Candiss Norse, Harmeet, MD   amLODipine (NORVASC) tablet 10 mg, 10 mg, Oral, QPM, Mayo, Pete Pelt, MD, 10 mg at 03/28/19 1721   calcium gluconate 1 g/ 50 mL sodium chloride IVPB, 1 g, Intravenous, Once, Mayo, Pete Pelt, MD   Chlorhexidine Gluconate Cloth 2 % PADS 6 each, 6 each, Topical, Q0600,  Murlean Iba, MD, 6 each at 03/28/19 1434   diazepam (VALIUM) injection 5 mg, 5 mg, Intravenous, Q4H PRN, Tukov-Yual, Magdalene S, NP, 5 mg at 03/28/19 1336   feeding supplement (NEPRO CARB STEADY) liquid 237 mL, 237 mL, Oral, PRN, Candiss Norse, Harmeet, MD   fluticasone (FLONASE) 50 MCG/ACT nasal spray 1-2 spray, 1-2 spray, Each Nare, Daily, Mayo, Pete Pelt, MD, 2 spray at 03/27/19 2045   heparin injection 1,000 Units, 1,000 Units, Dialysis, PRN, Murlean Iba, MD   hydrALAZINE (APRESOLINE) injection 10 mg, 10 mg, Intravenous, Q6H PRN, Fritzi Mandes, MD, 10 mg at 03/29/19 0548   labetalol (NORMODYNE) tablet 100 mg, 100 mg, Oral, QHS, Mayo, Pete Pelt, MD, 100 mg at 03/28/19 2221   loratadine (CLARITIN) tablet 10 mg, 10 mg, Oral, Daily, Mayo, Pete Pelt, MD, 10 mg at 03/29/19 3235   MEDLINE mouth rinse, 15 mL, Mouth Rinse, q12n4p, Kasa, Kurian, MD, 15 mL at 03/28/19 1721   ondansetron (ZOFRAN) tablet 4 mg, 4 mg, Oral, Q6H PRN **OR** ondansetron (ZOFRAN) injection 4 mg, 4 mg, Intravenous, Q6H PRN, Mayo, Pete Pelt, MD   polyethylene glycol (MIRALAX / GLYCOLAX) packet 17 g, 17 g, Oral, Daily PRN, Mayo, Pete Pelt, MD   sodium bicarbonate tablet 650 mg, 650 mg, Oral, TID, Mayo, Pete Pelt, MD, 650 mg at 03/29/19 0811   tuberculin injection 5 Units, 5 Units, Intradermal, Once, Murlean Iba, MD, 5 Units at 03/27/19 2259    ALLERGIES   Chlorthalidone; Hydrocodone; and Sulfonamide derivatives    REVIEW  OF SYSTEMS     10 point ROS conducted and is negative except as per subjective findings  PHYSICAL EXAMINATION   Vitals:   03/29/19 1100 03/29/19 1115  BP: (!) 192/105 (!) 176/105  Pulse: 93 77  Resp: (!) 33   Temp:    SpO2:      GENERAL: No apparent distress HEAD: Normocephalic, atraumatic.  EYES: Pupils equal, round, reactive to light.  No scleral icterus.  MOUTH: Moist mucosal membrane. NECK: Supple. No thyromegaly. No nodules. No JVD.  PULMONARY: Mild bibasilar  crepitations CARDIOVASCULAR: S1 and S2. Regular rate and rhythm. No murmurs, rubs, or gallops.  GASTROINTESTINAL: Soft, nontender, non-distended. No masses. Positive bowel sounds. No hepatosplenomegaly.  MUSCULOSKELETAL: No swelling, clubbing, or edema.  NEUROLOGIC: Mild distress due to acute illness SKIN:intact,warm,dry   LABS AND IMAGING     LAB RESULTS: Recent Labs  Lab 03/27/19 2010 03/28/19 0515 03/29/19 0515  NA 138 138 142  K 4.3 4.5 4.2  CL 101 102 105  CO2 16* 20* 23  BUN 92* 115* 87*  CREATININE 13.86* 15.20* 11.48*  GLUCOSE 102* 115* 108*   Recent Labs  Lab 03/27/19 2010 03/28/19 0515 03/28/19 1610 03/29/19 0515  HGB 7.5* 6.8* 8.3* 8.5*  HCT 22.5* 20.6* 24.8* 25.6*  WBC 10.2 8.3  --  8.7  PLT 267 237  --  234     IMAGING RESULTS: US Renal  Result Date: 03/28/2019 CLINICAL DATA:  End-stage renal disease. EXAM: RENAL / URINARY TRACT ULTRASOUND COMPLETE COMPARISON:  CT abdomen pelvis dated August 28, 2018. FINDINGS: Right Kidney: Renal measurements: 8.7 x 3.7 x 4.0 cm = volume: 68 mL. Increased echogenicity. No mass or hydronephrosis visualized. Small 1.1 cm exophytic simple cyst arising from the midpole. Left Kidney: Renal measurements: 8.1 x 5.7 x 4.9 cm = volume: 1 at the mL. Increased echogenicity. No mass or hydronephrosis visualized. Bladder: Appears normal for degree of bladder distention. IMPRESSION: 1. Bilateral increased renal echogenicity, consistent with medical renal disease. No acute abnormality. Electronically Signed   By: Titus Dubin M.D.   On: 03/28/2019 17:04      ASSESSMENT AND PLAN      -Multidisciplinary rounds held today  Acute Hypoxic Respiratory Failure         Due to pulmonary edema from both dialysis noncompliance and acute decompensated diastolic CHF EF 10-07%  - BNP - 3985 .0 Net 1L negative during day shift today  -continue Bronchodilator Therapy -follow up cardiac enzymes as indicated ICU monitoring   Severe  anemia   -No signs of grossly apparent bleeding/hematemesis/hematochezia  -Likely due to anemia of chronic disease/renal failure  -We will discuss Epogen with nephrology   Renal Failure-most likely ESRD  -Nephrology on case-appreciate input -For ultrasound today -follow chem 7 -continue Foley Catheter-assess need daily    GI/Nutrition GI PROPHYLAXIS as indicated DIET-->TF's as tolerated Constipation protocol as indicated  ENDO - ICU hypoglycemic\Hyperglycemia protocol -check FSBS per protocol   ELECTROLYTES -follow labs as needed -replace as needed -pharmacy consultation   DVT/GI PRX ordered -SCDs  TRANSFUSIONS AS NEEDED MONITOR FSBS ASSESS the need for LABS as needed   Critical care provider statement:   Critical care time (minutes): 34  Critical care time was exclusive of: Separately billable procedures and treating other patients  Critical care was necessary to treat or prevent imminent or life-threatening deterioration of the following conditions:  Acute hypoxemic respiratory failure, pulmonary edema, ESRD, emergent dialysis, tensional hypertension  Critical care was time spent personally by me  on the following activities: Development of treatment plan with patient or surrogate, discussions with consultants, evaluation of patient's response to treatment, examination of patient, obtaining history from patient or surrogate, ordering and performing treatments and interventions, ordering and review of laboratory studies and re-evaluation of patient's condition.  I assumed direction of critical care for this patient from another provider in my specialty: no    This document was prepared using Dragon voice recognition software and may include unintentional dictation errors.    Ottie Glazier, M.D.  Division of [facility]

## 2019-03-30 LAB — QUANTIFERON-TB GOLD PLUS (RQFGPL)
QuantiFERON Mitogen Value: 1.25 IU/mL
QuantiFERON Nil Value: 0.01 IU/mL
QuantiFERON TB1 Ag Value: 0.01 IU/mL
QuantiFERON TB2 Ag Value: 0.01 IU/mL

## 2019-03-30 LAB — QUANTIFERON-TB GOLD PLUS: QuantiFERON-TB Gold Plus: NEGATIVE

## 2019-03-30 MED ORDER — MINOXIDIL 2.5 MG PO TABS: 5.0000 mg | ORAL_TABLET | Freq: Every day | ORAL | 0 refills | Status: DC

## 2019-03-30 NOTE — Progress Notes (Signed)
HD Tx Start   03/30/19 1030  Vital Signs  Pulse Rate 79  Resp (!) 22  Oxygen Therapy  SpO2 99 %  During Hemodialysis Assessment  Blood Flow Rate (mL/min) 300 mL/min  Arterial Pressure (mmHg) -130 mmHg  Venous Pressure (mmHg) 80 mmHg  Transmembrane Pressure (mmHg) 60 mmHg  Ultrafiltration Rate (mL/min) 680 mL/min (680 mL per HOUR removed)  Dialysate Flow Rate (mL/min) 600 ml/min  Conductivity: Machine  14  HD Safety Checks Performed Yes  Dialysis Fluid Bolus Normal Saline  Bolus Amount (mL) 250 mL  Intra-Hemodialysis Comments Tx initiated  Hemodialysis Catheter  Placement Date: 03/27/19    Site Condition No complications  Blue Lumen Status Infusing  Red Lumen Status Infusing

## 2019-03-30 NOTE — Progress Notes (Signed)
PT Cancellation Note  Patient Details Name: Devin Harmon MRN: [id number] DOB: 1956-09-26   Cancelled Treatment:    Reason Eval/Treat Not Completed: Patient declined, no reason specified;Other (comment) Per OT attempt to consult, patient resting and not interested in participating in therapy this morning. PT will f/u at later time/date.  Myles Gip PT, DPT [phone] 03/30/2019, 10:01 AM

## 2019-03-30 NOTE — Progress Notes (Signed)
Canceling referral, patient is refusing dialysis placement at this time.

## 2019-03-30 NOTE — Progress Notes (Signed)
Sent secure chat to Dr Benjie Karvonen, requesting her to call and speak with pt's wife if she had not already done so. Awaiting response.

## 2019-03-30 NOTE — Discharge Summary (Signed)
[facility] [place] NAME: Devin Harmon    MR#:  [id number]  DATE OF BIRTH:  1956-05-19  DATE OF ADMISSION:  03/27/2019 ADMITTING PHYSICIAN: Sela Hua, MD  DATE OF DISCHARGE: 03/30/2019  PRIMARY CARE PHYSICIAN: Venia Carbon, MD    ADMISSION DIAGNOSIS:  Acute pulmonary edema ([place]) [J81.0] ESRD (end stage renal disease) ([place]) [N18.6]  DISCHARGE DIAGNOSIS:  Active Problems:   Acute respiratory failure with hypoxia ([place])   SECONDARY DIAGNOSIS:   Past Medical History:  Diagnosis Date  . Allergy   . BPH (benign prostatic hypertrophy)   . Hypertension   . Renal insufficiency   . Zoster 2007   facial, hospital 2007    HOSPITAL COURSE:   63 year old male with ESRD and HTN who presented to the ED with increasing SOB.  1. Acute hypoxic respiratory failure from ESRD: Patient has refused/declined dialysis.  Patient will be discharged home with hospice.  2.  ESRD: Patient was initiated on dialysis however he has voiced several times that he did not want dialysis. Plan to send patient home with hospice.  Discussed with patient's wife.  3.  Hyperkalemia due to problem #2  4.  Essential hypertension: Patient will continue on Hytrin, Norvasc and labetalol  5.  Anemia of chronic kidney disease   DISCHARGE CONDITIONS AND DIET:   Patient with guarded condition on regular diet with hospice  CONSULTS OBTAINED:  Treatment Team:  Murlean Iba, MD [facility], Dolores Lory, MD [facility], [facility], MD  DRUG ALLERGIES:   Allergies  Allergen Reactions  . Chlorthalidone     REACTION: felt "bad" and ED  . Hydrocodone Other (See Comments)    Nausea and dizzy  . Sulfonamide Derivatives     REACTION: unspecified    DISCHARGE MEDICATIONS:   Allergies as of 03/30/2019      Reactions   Chlorthalidone    REACTION: felt "bad" and ED   Hydrocodone Other (See Comments)   Nausea and dizzy   Sulfonamide Derivatives    REACTION:  unspecified      Medication List    STOP taking these medications   montelukast 10 MG tablet Commonly known as:  SINGULAIR   thiamine 100 MG tablet     TAKE these medications   acetaminophen 500 MG tablet Commonly known as:  TYLENOL Take 500-1,000 mg by mouth every 6 (six) hours as needed for mild pain.   amLODipine 10 MG tablet Commonly known as:  NORVASC Take 1 tablet (10 mg total) by mouth daily. What changed:  when to take this   cetirizine 10 MG tablet Commonly known as:  ZYRTEC Take 10 mg by mouth every evening.   fluticasone 50 MCG/ACT nasal spray Commonly known as:  FLONASE Place 1-2 sprays into both nostrils daily.   labetalol 100 MG tablet Commonly known as:  NORMODYNE Take 1 tablet (100 mg total) by mouth 2 (two) times daily. What changed:  when to take this   minoxidil 2.5 MG tablet Commonly known as:  LONITEN Take 2 tablets (5 mg total) by mouth daily. Start taking on:  March 31, 2019   multivitamin with minerals Tabs tablet Take 1 tablet by mouth daily.   sodium bicarbonate 650 MG tablet Take 1 tablet (650 mg total) by mouth 3 (three) times daily.   terazosin 10 MG capsule Commonly known as:  HYTRIN Take 1 capsule (10 mg total) by mouth at bedtime. What changed:    when to take  this  reasons to take this         Today   CHIEF COMPLAINT:   No acute events overnight   VITAL SIGNS:  Blood pressure (!) 150/92, pulse 84, temperature 98.6 F (37 C), temperature source Oral, resp. rate 12, height 5\' 9"  (1.753 m), weight 88.9 kg, SpO2 96 %.   REVIEW OF SYSTEMS:  Review of Systems  Constitutional: Negative.  Negative for chills, fever and malaise/fatigue.  HENT: Negative.  Negative for ear discharge, ear pain, hearing loss, nosebleeds and sore throat.   Eyes: Negative.  Negative for blurred vision and pain.  Respiratory: Positive for shortness of breath. Negative for cough, hemoptysis and wheezing. Sputum production: t times.    Cardiovascular: Negative.  Negative for chest pain, palpitations and leg swelling.  Gastrointestinal: Negative.  Negative for abdominal pain, blood in stool, diarrhea, nausea and vomiting.  Genitourinary: Negative.  Negative for dysuria.  Musculoskeletal: Negative.  Negative for back pain.  Skin: Negative.   Neurological: Negative for dizziness, tremors, speech change, focal weakness, seizures and headaches.  Endo/Heme/Allergies: Negative.  Does not bruise/bleed easily.  Psychiatric/Behavioral: Negative.  Negative for depression, hallucinations and suicidal ideas.     PHYSICAL EXAMINATION:  GENERAL:  63 y.o.-year-old patient lying in the bed with no acute distress.  NECK:  Supple, no jugular venous distention. No thyroid enlargement, no tenderness.  LUNGS: Normal breath sounds bilaterally, no wheezing, rales,rhonchi  No use of accessory muscles of respiration.  CARDIOVASCULAR: S1, S2 normal. No murmurs, rubs, or gallops.  ABDOMEN: Soft, non-tender, non-distended. Bowel sounds present. No organomegaly or mass.  EXTREMITIES: No pedal edema, cyanosis, or clubbing.  PSYCHIATRIC: The patient is alert and oriented x 3.  SKIN: No obvious rash, lesion, or ulcer.   DATA REVIEW:   CBC Recent Labs  Lab 03/29/19 0515  WBC 8.7  HGB 8.5*  HCT 25.6*  PLT 234    Chemistries  Recent Labs  Lab 03/28/19 1135 03/29/19 0515  NA  --  142  K  --  4.2  CL  --  105  CO2  --  23  GLUCOSE  --  108*  BUN  --  87*  CREATININE  --  11.48*  CALCIUM  --  7.3*  MG  --  1.8  AST 21  --   ALT 12  --   ALKPHOS 51  --   BILITOT 0.9  --     Cardiac Enzymes Recent Labs  Lab 03/27/19 2010  TROPONINI 0.05*    Microbiology Results  @[email or web address] @  RADIOLOGY:  US Renal  Result Date: 03/28/2019 CLINICAL DATA:  End-stage renal disease. EXAM: RENAL / URINARY TRACT ULTRASOUND COMPLETE COMPARISON:  CT abdomen pelvis dated August 28, 2018. FINDINGS: Right Kidney: Renal measurements: 8.7 x 3.7 x  4.0 cm = volume: 68 mL. Increased echogenicity. No mass or hydronephrosis visualized. Small 1.1 cm exophytic simple cyst arising from the midpole. Left Kidney: Renal measurements: 8.1 x 5.7 x 4.9 cm = volume: 1 at the mL. Increased echogenicity. No mass or hydronephrosis visualized. Bladder: Appears normal for degree of bladder distention. IMPRESSION: 1. Bilateral increased renal echogenicity, consistent with medical renal disease. No acute abnormality. Electronically Signed   By: Titus Dubin M.D.   On: 03/28/2019 17:04      Allergies as of 03/30/2019      Reactions   Chlorthalidone    REACTION: felt "bad" and ED   Hydrocodone Other (See Comments)   Nausea and dizzy   Sulfonamide Derivatives  REACTION: unspecified      Medication List    STOP taking these medications   montelukast 10 MG tablet Commonly known as:  SINGULAIR   thiamine 100 MG tablet     TAKE these medications   acetaminophen 500 MG tablet Commonly known as:  TYLENOL Take 500-1,000 mg by mouth every 6 (six) hours as needed for mild pain.   amLODipine 10 MG tablet Commonly known as:  NORVASC Take 1 tablet (10 mg total) by mouth daily. What changed:  when to take this   cetirizine 10 MG tablet Commonly known as:  ZYRTEC Take 10 mg by mouth every evening.   fluticasone 50 MCG/ACT nasal spray Commonly known as:  FLONASE Place 1-2 sprays into both nostrils daily.   labetalol 100 MG tablet Commonly known as:  NORMODYNE Take 1 tablet (100 mg total) by mouth 2 (two) times daily. What changed:  when to take this   minoxidil 2.5 MG tablet Commonly known as:  LONITEN Take 2 tablets (5 mg total) by mouth daily. Start taking on:  March 31, 2019   multivitamin with minerals Tabs tablet Take 1 tablet by mouth daily.   sodium bicarbonate 650 MG tablet Take 1 tablet (650 mg total) by mouth 3 (three) times daily.   terazosin 10 MG capsule Commonly known as:  HYTRIN Take 1 capsule (10 mg total) by mouth at  bedtime. What changed:    when to take this  reasons to take this         Management plans discussed with the patient's wife and she is in agreement. Stable for discharge   Patient should follow up with hospice  CODE STATUS:     Code Status Orders  (From admission, onward)         Start     Ordered   03/27/19 1425  Limited resuscitation (code)  Continuous    Question Answer Comment  In the event of cardiac or respiratory ARREST: Initiate Code Blue, Call Rapid Response Yes   In the event of cardiac or respiratory ARREST: Perform CPR No   In the event of cardiac or respiratory ARREST: Perform Intubation/Mechanical Ventilation Yes   In the event of cardiac or respiratory ARREST: Use NIPPV/BiPAp only if indicated Yes   In the event of cardiac or respiratory ARREST: Administer ACLS medications if indicated No   In the event of cardiac or respiratory ARREST: Perform Defibrillation or Cardioversion if indicated No      03/27/19 1424        Code Status History    Date Active Date Inactive Code Status Order ID Comments User Context   08/30/2018 1523 09/01/2018 1725 DNR [id number]  Mendel Corning, MD Inpatient   08/28/2018 0408 08/30/2018 1523 Full Code [id number]  Rise Patience, MD Inpatient      TOTAL TIME TAKING CARE OF THIS PATIENT: 38 minutes.    Note: This dictation was prepared with Dragon dictation along with smaller phrase technology. Any transcriptional errors that result from this process are unintentional.  Bettey Costa M.D on 03/30/2019 at 1:15 PM  Between 7am to 6pm - Pager - [phone] After 6pm go to [email or web address] - password [email or web address] [facility]  Office  [phone]  CC: Primary care physician; Venia Carbon, MD

## 2019-03-30 NOTE — Progress Notes (Signed)
]  OT Cancellation Note  Patient Details Name: Devin Harmon MRN: [id number] DOB: 06/15/1956   Cancelled Treatment:    Reason Eval/Treat Not Completed: Patient declined, no reason specified. Thank you for the OT consult. Order received and chart reviewed. Upon arrival to pt room pt in bed, appeared to be sleeping. Woke briefly to verbal prompts. OT introduced self and briefly described role of OT. Pt responded with "Not this morning" and appeared to return to sleep. Pt did not rouse to further VC's. Will re-attempt at a later time/date as available and pt medically appropriate for OT.   Shara Blazing, M.S., OTR/L Ascom: [phone] 03/30/19, 9:19 AM

## 2019-03-30 NOTE — Progress Notes (Signed)
Pre HD Assessment    03/30/19 1020  Neurological  Level of Consciousness Alert  Orientation Level Oriented X4  Respiratory  Respiratory Pattern Regular  Chest Assessment Chest expansion symmetrical  Bilateral Breath Sounds Diminished;Rhonchi  Cough Non-productive  Cardiac  Pulse Regular  Heart Sounds S1, S2  Jugular Venous Distention (JVD) No  ECG Monitor Yes  Cardiac Rhythm NSR;BBB  Ectopy Ventricular tachycardia, non-sustained;Unifocal PVC's  Ectopy Frequency Occasional  Antiarrhythmic device No  Vascular  R Radial Pulse +2  L Radial Pulse +2  Edema Right upper extremity;Left upper extremity;Right lower extremity;Left lower extremity  Generalized Edema +1  RUE Edema Non-pitting  LUE Edema Non-pitting  RLE Edema Non-pitting  LLE Edema Non-Pitting  Integumentary  Integumentary (WDL) WDL  Skin Condition Dry;Flaky  Musculoskeletal  Musculoskeletal (WDL) WDL  Psychosocial  Psychosocial (WDL) X  Patient Behaviors Calm;Cooperative  Needs Expressed Emotional ("I want to go home")  Emotional support given Given to patient

## 2019-03-30 NOTE — Progress Notes (Signed)
OT Cancellation Note  Patient Details Name: Devin Harmon MRN: [id number] DOB: 04-Jul-1956   Cancelled Treatment:    Reason Eval/Treat Not Completed: Patient at procedure or test/ unavailable. Pt out for dialysis. Will follow acutely and re-attempt at a later time/date as available and pt medically appropriate for OT tx.   Shara Blazing, M.S., OTR/L Ascom: [phone] 03/30/19, 1:04 PM

## 2019-03-30 NOTE — Progress Notes (Signed)
Post HD Tx  879mL removed, pt signed off early AMA, MD aware.    03/30/19 1222  Vital Signs  Pulse Rate 88  Resp (!) 23  BP (!) 137/95  Oxygen Therapy  SpO2 99 %  Post-Hemodialysis Assessment  Rinseback Volume (mL) 250 mL  Dialyzer Clearance Lightly streaked  Duration of HD Treatment -hour(s) 2 hour(s) (2 hours, signed off early AMA, MD aware.)  Hemodialysis Intake (mL) 500 mL  UF Total -Machine (mL) 1300 mL  Net UF (mL) 800 mL  Hemodialysis Catheter  Placement Date: 03/27/19    Site Condition No complications  Blue Lumen Status Flushed;Saline locked;Heparin locked;Capped (Central line)  Red Lumen Status Flushed;Saline locked;Capped (Central line);Heparin locked  Catheter fill solution Heparin 1000 units/ml  Catheter fill volume (Arterial) 1.8 cc  Catheter fill volume (Venous) 1.8  Dressing Type Occlusive  Dressing Status Clean;Dry;Intact  Post treatment catheter status Capped and Clamped

## 2019-03-30 NOTE — Progress Notes (Signed)
Pt returned from HD. Pt refused to eat lunch stating he was not hungry. Pt asking when he can go home. Pt educated RN is waiting for complete orders and for services to be set up. RN will educate pt when to call his wife. Pt verbalizes understanding. NAD.

## 2019-03-30 NOTE — Progress Notes (Signed)
HD Tx End   03/30/19 1220  Vital Signs  Temp 98.6 F (37 C)  Temp Source Oral  Pulse Rate 88  Pulse Rate Source Monitor  Resp (!) 27  BP (!) 138/98  BP Location Right Arm  BP Method Automatic  Patient Position (if appropriate) Lying  Oxygen Therapy  SpO2 97 %  O2 Device Room Air  Pain Assessment  Pain Scale 0-10  Pain Score 0  Dialysis Weight  Weight 88.9 kg  Type of Weight Post-Dialysis  During Hemodialysis Assessment  Blood Flow Rate (mL/min) 200 mL/min  Arterial Pressure (mmHg) -90 mmHg  Venous Pressure (mmHg) 80 mmHg  Transmembrane Pressure (mmHg) 60 mmHg  Ultrafiltration Rate (mL/min) 680 mL/min  Dialysate Flow Rate (mL/min) 600 ml/min  Conductivity: Machine  14  HD Safety Checks Performed Yes  Dialysis Fluid Bolus Normal Saline  Bolus Amount (mL) 250 mL  Intra-Hemodialysis Comments Tx completed (pt stopping tx early AMA, MD aware.)

## 2019-03-30 NOTE — Discharge Instructions (Signed)
Eating Plan for Dialysis Dialysis is a procedure that is done when the kidneys have stopped working properly (kidney failure). During dialysis, wastes, salt, and extra water are removed from the blood, and the levels of certain minerals in the blood are maintained. If you are undergoing dialysis, it is important to pay careful attention to what you eat. Between dialysis sessions, certain nutrients and wastes can build up in your blood and cause you to get sick. Even though nutrients, such as carbohydrates, fats, vitamins, and minerals, are an important part of a healthy diet, you may need to limit your intake of certain nutrients when you are on dialysis. When you are on dialysis, it is important that you work with your health care provider or a diet and nutrition specialist (dietitian) to help you make an eating plan that meets your specific needs. What are tips for following this plan? Reading food labels  Check food labels for the amount of: ? Potassium. This is found in milk, fruits, and vegetables. ? Phosphorus. This is found in milk, cheese, beans, nuts, and carbonated beverages. ? Sodium. Sodium content is high in processed and cured meats, ready-made frozen meals, canned vegetables, and salty snack foods.  Try to find foods that are low in potassium, phosphorus, and sodium.  Look for foods that are labeled "sodium free," "reduced sodium," or "low sodium." Shopping  Do not buy whole-grain and high-fiber foods because they contain high amounts of phosphorus.  Do not buy or use salt substitutes because they contain potassium.  Do not buy processed foods. These are usually high in sodium and phosphorus. Cooking  Drain all fluid from cooked vegetables and canned fruits before eating them.  Cut potatoes into small pieces and boil them in unsalted water before you eat them. This can help to remove some potassium from the potato.  To add flavor, try using herbs and spices that do not  contain sodium. Meal planning      Most people on dialysis should try to eat: ? 6-11 servings of grains each day. One serving is equal to 1 slice of bread or  cup of cooked rice or pasta. ? 2-3 servings of low-potassium vegetables each day. One serving is equal to  cup. ? 2-3 servings of low-potassium fruits each day. One serving is equal to  cup. ? High-quality proteins, such as meat, poultry, fish, and eggs. Talk with your health care provider or dietitian about the right amount and type of protein to include in your eating plan. ?  cup of dairy each day.  Avoid foods that are high in sodium and phosphorus. Foods that generally are very salty will be high in sodium, and foods that are high in fiber or starch may be high in phosphorus. General information  Follow your health care provider's instructions to restrict your fluid intake. You may be told to: ? Write down what you drink and any foods you eat that are made mostly from water, such as gelatin and soups. ? Drink from small cups to help control how much you drink.  Take vitamin and mineral supplements only as told by your health care provider.  Take over-the-counter and prescription medicines only as told by your health care provider. ? Your health care provider may recommend an over-the-counter medicine that binds phosphorus, such as an antacid medicine that contains calcium carbonate. What foods can I eat? Fruits Apples. Fresh or frozen berries. Fresh or canned pears, peaches, and pineapple. Grapes. Plums. Vegetables Fresh or  frozen broccoli, carrots, and green beans. Cabbage. Cauliflower. Celery. Cucumbers. Eggplant. Radishes. Zucchini. Grains White bread. White rice. Cooked cereal. Unsalted popcorn. Tortillas. Pasta. Meats and other proteins Fresh or frozen beef, pork, chicken, and fish. Eggs. Dairy Cream cheese. Heavy cream. Ricotta cheese. Beverages Apple cider. Cranberry juice. Grape juice. Lemonade. Black  coffee. Rice milk (that is not enriched or fortified). Condiments Herbs. Spices. Jam and jelly. Honey. Sweets and desserts Sherbet. Cakes. Cookies. Fats and oils Olive oil, canola oil, and safflower oil. Other Non-dairy creamer. Non-dairy whipped topping. Homemade broth without salt. The items listed above may not be a complete list of foods and beverages you can eat. Contact your dietitian for more options. What foods are not recommended? Fruits Star fruit. Bananas. Oranges. Kiwi. Nectarines. Prunes. Melon. Dried fruit. Avocado. Vegetables Potatoes. Beets. Tomatoes. Winter squash and pumpkin. Asparagus. Spinach. Parsnips. Grains Whole-grain bread. Whole-grain pasta. High-fiber cereal. Meats and other proteins Canned, smoked, and cured meats. Soil scientist. Sardines. Nuts and seeds. Peanut butter. Beans and legumes. Dairy Milk. Buttermilk. Yogurt. Cheese and cottage cheese. Processed cheese spreads. Beverages Orange juice. Prune juice. Carbonated soft drinks. Condiments Salt. Salt substitutes. Soy sauce. Sweets and desserts Ice cream. Chocolate. Candied nuts. Fats and oils Butter. Margarine. Other Ready-made frozen meals. Canned soups. The items listed above may not be a complete list of foods and beverages you should avoid. Contact your dietitian for more information. Summary  Dialysis is a procedure that is done when the kidneys have stopped working properly (kidney failure).  If you are undergoing dialysis, it is important to pay careful attention to what you eat. Between dialysis sessions, certain nutrients and wastes can build up in your blood and cause you to get sick.  Your dietitian will help you design an eating plan that is specific to your needs.  Avoid foods that are high in sodium and phosphorus. Restrict fluids as told by your health care provider or dietitian. This information is not intended to replace advice given to you by your health care provider.  Make sure you discuss any questions you have with your health care provider. Document Released: 08/14/2004 Document Revised: 02/03/2018 Document Reviewed: 11/18/2017 Elsevier Interactive Patient Education  2019 [place] for Chronic Kidney Disease When your kidneys are not working well, they cannot remove waste and excess substances from your blood as effectively as they did before. This can lead to a buildup and imbalance of these substances, which can worsen kidney damage and affect how your body functions. Certain foods lead to a buildup of these substances in the body. By changing your diet as recommended by your diet and nutrition specialist (dietitian) or health care provider, you could help prevent further kidney damage and delay or prevent the need for dialysis. What are tips for following this plan? General instructions   Work with your health care provider and dietitian to develop a meal plan that is right for you. Foods you can eat, limit, or avoid will be different for each person depending on the stage of kidney disease and any other existing health conditions.  Talk with your health care provider about whether you should take a vitamin and mineral supplement.  Use standard measuring cups and spoons to measure servings of foods. Use a kitchen scale to measure portions of protein foods.  If directed by your health care provider, avoid drinking too much fluid. Measure and count all liquids, including water, ice, soups, flavored gelatin, and frozen desserts such as  popsicles or ice cream. Reading food labels  Check the amount of sodium in foods. Choose foods that have less than 300 milligrams (mg) per serving.  Check the ingredient list for phosphorus or potassium-based additives or preservatives.  Check the amount of saturated and trans fat. Limit or avoid these fats as told by your dietitian. Shopping  Avoid buying foods that are: ? Processed, frozen, or  prepackaged. ? Calcium-enriched or fortified.  Do not buy foods that have salt or sodium listed among the first five ingredients.  Do not buy canned vegetables. Cooking  Replace animal proteins, such as meat, fish, eggs, or dairy, with plant proteins from beans, nuts, and soy. ? Use soy milk instead of cow's milk. ? Add beans or tofu to soups, casseroles, or pasta dishes instead of meat.  Soak vegetables, such as potatoes, before cooking to reduce potassium. To do this: ? Peel and cut into small pieces. ? Soak in warm water for at least 2 hours. For every 1 cup of vegetables, use 10 cups of water. ? Drain and rinse with warm water. ? Boil for at least 5 minutes. Meal planning  Limit the amount of protein from plant and animal sources you eat each day.  Do not add salt to food when cooking or before eating.  Eat meals and snacks at around the same time each day. If you have diabetes:  If you have diabetes (diabetes mellitus) and chronic kidney disease, it is important to keep your blood glucose in the target range recommended by your health care provider. Follow your diabetes management plan. This may include: ? Checking your blood glucose regularly. ? Taking oral medicines, insulin, or both. ? Exercising for at least 30 minutes on 5 or more days each week, or as told by your health care provider. ? Tracking how many servings of carbohydrates you eat at each meal.  You may be given specific guidelines on how much of certain foods and nutrients you may eat, depending on your stage of kidney disease and whether you have high blood pressure (hypertension). Follow your meal plan as told by your dietitian. What nutrients should be limited? The items listed are not a complete list. Talk with your dietitian about what dietary choices are best for you. Potassium Potassium affects how steadily your heart beats. If too much potassium builds up in your blood, it can cause an irregular  heartbeat or even a heart attack. You may need to eat less potassium, depending on your blood potassium levels and the stage of kidney disease. Talk to your dietitian about how much potassium you may have each day. You may need to limit or avoid foods that are high in potassium, such as:  Milk and soy milk.  Fruits, such as bananas, papaya, apricots, nectarines, melon, prunes, raisins, kiwi, and oranges.  Vegetables, such as potatoes, sweet potatoes, yams, tomatoes, leafy greens, beets, okra, avocado, pumpkin, and winter squash.  White and lima beans. Phosphorus Phosphorus is a mineral found in your bones. A balance between calcium and phosphorous is needed to build and maintain healthy bones. Too much phosphorus pulls calcium from your bones. This can make your bones weak and more likely to break. Too much phosphorus can also make your skin itch. You may need to eat less phosphorus depending on your blood phosphorus levels and the stage of kidney disease. Talk to your dietitian about how much potassium you may have each day. You may need to take medicine to lower your  blood phosphorus levels if diet changes do not help. You may need to limit or avoid foods that are high in phosphorus, such as:  Milk and dairy products.  Dried beans and peas.  Tofu, soy milk, and other soy-based meat replacements.  Colas.  Nuts and peanut butter.  Meat, poultry, and fish.  Bran cereals and oatmeals. Protein Protein helps you to make and keep muscle. It also helps in the repair of your bodys cells and tissues. One of the natural breakdown products of protein is a waste product called urea. When your kidneys are not working properly, they cannot remove wastes, such as urea, like they did before you developed chronic kidney disease. Reducing how much protein you eat can help prevent a buildup of urea in your blood. Depending on your stage of kidney disease, you may need to limit foods that are high in  protein. Sources of animal protein include:  Meat (all types).  Fish and seafood.  Poultry.  Eggs.  Dairy. Other protein foods include:  Beans and legumes.  Nuts and nut butter.  Soy and tofu. Sodium Sodium, which is found in salt, helps maintain a healthy balance of fluids in your body. Too much sodium can increase your blood pressure and have a negative effect on the function of your heart and lungs. Too much sodium can also cause your body to retain too much fluid, making your kidneys work harder. Most people should have less than 2,300 milligrams (mg) of sodium each day. If you have hypertension, you may need to limit your sodium to 1,500 mg each day. Talk to your dietitian about how much sodium you may have each day. You may need to limit or avoid foods that are high in sodium, such as:  Salt seasonings.  Soy sauce.  Cured and processed meats.  Salted crackers and snack foods.  Fast food.  Canned soups and most canned foods.  Pickled foods.  Vegetable juice.  Boxed mixes or ready-to-eat boxed meals and side dishes.  Bottled dressings, sauces, and marinades. Summary  Chronic kidney disease can lead to a buildup and imbalance of waste and excess substances in the body. Certain foods lead to a buildup of these substances. By adjusting your intake of these foods, you could help prevent more kidney damage and delay or prevent the need for dialysis.  Food adjustments are different for each person with chronic kidney disease. Work with a dietitian to set up nutrient goals and a meal plan that is right for you.  If you have diabetes and chronic kidney disease, it is important to keep your blood glucose in the target range recommended by your health care provider. This information is not intended to replace advice given to you by your health care provider. Make sure you discuss any questions you have with your health care provider. Document Released: 02/07/2003 Document  Revised: 11/12/2016 Document Reviewed: 11/12/2016 Elsevier Interactive Patient Education  2019 [place] Kidney Disease End-stage kidney disease occurs when the kidneys are so damaged that they cannot function and cannot get better. This condition may also be referred to as end-stage renal disease or ESRD. The kidneys are two organs that do many important jobs in the body, including:  Removing wastes and extra fluids from the blood.  Making hormones that maintain the amount of fluid in your tissues and blood vessels.  Maintaining the right amount of fluids and chemicals in the body. Without functioning kidneys, toxins build up in the  blood and life-threatening complications can occur. What are the causes? This condition usually occurs when a long-term (chronic) kidney disease gets worse and results in permanent damage to the kidneys. It may also be caused by sudden damage to the kidneys (acute kidney injury). Causes of this condition include:  Having a family history of chronic kidney disease (CKD).  Having chronic kidney disease for many years.  Chronic medical conditions that affect the kidneys, such as: ? Cardiovascular disease, including high blood pressure. ? Diabetes. ? Certain diseases that affect the body's disease-fighting (immune) system.  Overuse of over-the-counter pain medicines.  Being around or being in contact with poisonous (toxic) substances. What increases the risk? The following factors may make you more likely to develop this condition:  Being older than 60.  Being male.  Being of African-American, Asian, Native American, [place], or Hispanic descent.  Smoking or a history of smoking.  Obesity. What are the signs or symptoms? Symptoms of this condition include:  Swelling (edema) of the face, legs, ankles, or feet.  Numbness, tingling, or loss of feeling in the hands or feet.  Tiredness (lethargy).  Nausea or  vomiting.  Confusion, trouble concentrating, or loss of consciousness.  Chest pain.  Shortness of breath.  Passing little or no urine.  Muscle twitches and cramps, especially in the legs.  Dry, itchy skin.  Loss of appetite.  Pale skin due to anemia, including the skin and tissue around the eye (conjunctiva).  Headaches.  Abnormally dark or light skin.  Decrease in muscle size (muscle wasting).  Easy bruising.  Frequent hiccups.  Stopping of the monthly period in women.  Jerky movements (seizures). How is this diagnosed? This condition may be diagnosed based on:  A physical exam, including blood pressure measurements.  Urine tests.  Blood tests.  Imaging tests.  A test in which a sample of tissue is removed from the kidneys to be examined under a microscope (kidney biopsy). How is this treated? This condition may be treated with:  A procedure that removes toxic wastes from the body (dialysis). There are two types of dialysis: ? Dialysis that is done through your abdomen (peritoneal dialysis). This may be done several times a day. ? Dialysis that is done by a machine (hemodialysis). This may be done several times a week.  Surgery to receive a new kidney (kidney transplant). In addition to having dialysis or a kidney transplant, you may need to take medicines:  To control high blood pressure (hypertension).  To control high cholesterol.  To treat diabetes.  To maintain healthy levels of minerals in the blood (electrolytes). You may also be given a specific meal plan to follow that includes requirements or limits for:  Salt (sodium).  Protein.  Phosphorous.  Potassium.  Calcium. Follow these instructions at home: Medicines  Take over-the-counter and prescription medicines only as told by your health care provider.  Do not take any new medicines, vitamins, or mineral supplements unless approved by your health care provider. Many medicines and  supplements can worsen kidney damage.  Follow instructions from your health care provider about adjusting the doses of any medicines you take. Lifestyle  Do not use any products that contain nicotine or tobacco, such as cigarettes and e-cigarettes. If you need help quitting, ask your health care provider.  Achieve and maintain a healthy weight. If you need help with this, ask your health care provider.  Start or continue an exercise plan. Exercise at least 30 minutes a day, 5  days a week.  Follow your prescribed meal plan. General instructions  Stay current with your shots (immunizations) as told by your health care provider.  Keep track of your blood pressure. Report changes in your blood pressure as told by your health care provider.  If you are being treated for diabetes, monitor and track your blood sugar (blood glucose) levels as told by your health care provider.  Keep all follow-up visits as told by your health care provider. This is important. Where to find more information  American Association of Kidney Patients: BombTimer.gl  National Kidney Foundation: www.kidney.[place]: https://mathis.com/  [facility]: www.lifeoptions.org and www.kidneyschool.org Contact a health care provider if:  Your symptoms get worse.  You develop new symptoms. Get help right away if:  You have weakness in an arm or leg on one side of your body.  You have difficulty speaking or you are slurring your speech.  You have a sudden change in your vision.  You have a sudden, severe headache.  You have a sudden weight increase.  You have difficulty breathing.  Your symptoms suddenly get worse. Summary  End-stage kidney disease occurs when the kidneys are so damaged that they cannot function and cannot get better.  Without functioning kidneys, toxins build up in the blood and life-threatening complications can occur.  Treatment may include  dialysis or a kidney transplant along with medicines and lifestyle changes. This information is not intended to replace advice given to you by your health care provider. Make sure you discuss any questions you have with your health care provider. Document Released: 02/07/2004 Document Revised: 12/23/2016 Document Reviewed: 12/23/2016 Elsevier Interactive Patient Education  2019 [place].   Hemodialysis, Care After This sheet gives you information about how to care for yourself after your procedure. Your health care provider may also give you more specific instructions. If you have problems or questions, contact your health care provider. What can I expect after the procedure? After the procedure, it is common to have:  Fatigue. Energy levels usually return to normal the day after the procedure.  Itchiness. Your health care provider may be able to prescribe medicine to relieve this.  Achy or jittery legs. You may feel like kicking your legs. This sometimes causes sleeping problems. Follow these instructions at home: Eating and drinking   Talk with your health care provider about how much fluid you can drink. Keep track of your fluid intake to make sure you do not drink more fluid than is allowed. This is important because fluid can build up in your body between dialysis sessions. Built-up fluid affects your blood pressure and can make your heart work harder.  Limit your salt (sodium) intake. This is important because: ? Sodium makes you thirsty, which creates a problem since the amount of fluid you can drink every day is limited. ? Sodium affects your blood pressure levels. The more you control the amount of sodium in your diet, the more control you may have over your blood pressure levels.  Limit your intake of the mineral potassium. Potassium levels can rise between dialysis sessions. If they become too high, they can cause a dangerous heart rhythm and even death.  Limit your intake  of the mineral phosphorus. Consuming too much phosphorus can cause your bones to lose calcium. This makes them weaker and more likely to break. Consuming too much phosphorus may also make your skin itch.  Eat high-quality proteins that are low in phosphorus. High-quality  proteins include those found in fish, poultry, and eggs. Vascular access care  Avoid sleeping on the site where blood is removed from your body and returned to your body (vascular access).  If your vascular access is on your arm: ? Do not lift weights or heavy objects with that arm. ? Do not wear tight clothing over that arm. ? Do not have your blood pressure measured on that arm. ? Do not allow needle punctures (such as for taking blood) on that arm.  If you have a catheter, do not open it between dialysis sessions.  If you have a fistula or graft: ? Wash it with soap every day and before each dialysis session. ? Feel for a vibration over the access site (thrill) every day. A thrill means the access is working. General instructions   Take over-the-counter and prescription medicines only as told by your health care provider. This includes vitamin and mineral supplements. Talk with your health care provider before taking new supplements.  Carry a wallet card, bracelet, or medical identification tag that shows you are a dialysis patient. Always keep it with you. If you are in an accident or have a medical emergency and cannot communicate, this item will inform health care providers about your condition.  Keep all dialysis visits as told by your health care provider. Dialysis appointments should be scheduled regularly. Do not skip an appointment. Contact a health care provider if:  You have a fever.  You have chills.  Your vascular access feels warm.  You find a pimple on your vascular access.  You have any of the following around the vascular access site: ? Swelling. ? Redness. ? Bleeding or drainage. Get help  right away if:  You have a fistula, and pain, numbness, or unusual paleness develops in the hand on the side of your fistula.  You have a fistula or graft and you do not feel a thrill.  You develop dizziness or weakness that you have not had before.  You develop shortness of breath.  You develop chest pain.  There is pus at the vascular access site.  There is bleeding at the vascular access site that cannot be easily controlled.  You become disoriented or confused.  You develop blurred vision.  You have jerky movements you cannot control (seizure). Summary  After hemodialysis, it is common to have fatigue, itchiness, and achy or jittery legs.  Keep close track of your fluid intake, limit intake of sodium, potassium, and phosphorous, eat high-quality proteins, and take vitamin and mineral supplements as directed by your health care provider.  Take proper care of your vascular access, and contact your health care provider if you have problems with your vascular access, such as warmth, swelling, redness, drainage, or bleeding at the site. This information is not intended to replace advice given to you by your health care provider. Make sure you discuss any questions you have with your health care provider. Document Released: 03/14/2013 Document Revised: 10/16/2017 Document Reviewed: 10/30/2016 Elsevier Interactive Patient Education  2019 [employer].

## 2019-03-30 NOTE — Progress Notes (Addendum)
Patients bed alarm was activated. Staff arrived to room to find pt. sitting on the floor. Pt. is stable. Vital signs obtained. No c/o pain or injuries noted. AC and pt's wife notified via phone call. On call Dr. notified via text. No other issues noted at this time.

## 2019-03-30 NOTE — Progress Notes (Signed)
Nsg Discharge Note  Admit Date:  03/27/2019 Discharge date: 03/30/2019 Spoke with pt's wife [place] over the phone. Verbalizes understanding of discharge instructions.  Lorna Dibble to be D/C'd Home per MD order.  AVS completed.  Copy for chart, and copy for patient signed, and dated. Patient/caregiver able to verbalize understanding.  Discharge Medication: Allergies as of 03/30/2019      Reactions   Chlorthalidone    REACTION: felt "bad" and ED   Hydrocodone Other (See Comments)   Nausea and dizzy   Sulfonamide Derivatives    REACTION: unspecified      Medication List    STOP taking these medications   montelukast 10 MG tablet Commonly known as:  SINGULAIR   thiamine 100 MG tablet     TAKE these medications   acetaminophen 500 MG tablet Commonly known as:  TYLENOL Take 500-1,000 mg by mouth every 6 (six) hours as needed for mild pain.   amLODipine 10 MG tablet Commonly known as:  NORVASC Take 1 tablet (10 mg total) by mouth daily. What changed:  when to take this   cetirizine 10 MG tablet Commonly known as:  ZYRTEC Take 10 mg by mouth every evening.   fluticasone 50 MCG/ACT nasal spray Commonly known as:  FLONASE Place 1-2 sprays into both nostrils daily.   labetalol 100 MG tablet Commonly known as:  NORMODYNE Take 1 tablet (100 mg total) by mouth 2 (two) times daily. What changed:  when to take this   minoxidil 2.5 MG tablet Commonly known as:  LONITEN Take 2 tablets (5 mg total) by mouth daily. Start taking on:  March 31, 2019   multivitamin with minerals Tabs tablet Take 1 tablet by mouth daily.   sodium bicarbonate 650 MG tablet Take 1 tablet (650 mg total) by mouth 3 (three) times daily.   terazosin 10 MG capsule Commonly known as:  HYTRIN Take 1 capsule (10 mg total) by mouth at bedtime. What changed:    when to take this  reasons to take this            Durable Medical Equipment  (From admission, onward)         Start      Ordered   03/30/19 0000  For home use only DME oxygen    Question Answer Comment  Mode or (Route) Nasal cannula   Liters per Minute 2   Frequency Continuous (stationary and portable oxygen unit needed)   Oxygen conserving device Yes   Oxygen delivery system Gas      03/30/19 1321          Discharge Assessment: Vitals:   03/30/19 1222 03/30/19 1300  BP: (!) 137/95 (!) 150/92  Pulse: 88 84  Resp: (!) 23 12  Temp:    SpO2: 99% 96%   Skin clean, dry and intact without evidence of skin break down, no evidence of skin tears noted. IV catheter discontinued intact. Site without signs and symptoms of complications - no redness or edema noted at insertion site, patient denies c/o pain - only slight tenderness at site.  Dressing with slight pressure applied.  D/c Instructions-Education: Discharge instructions given to patient/family with verbalized understanding. D/c education completed with patient/family including follow up instructions, medication list, d/c activities limitations if indicated, with other d/c instructions as indicated by MD - patient able to verbalize understanding, all questions fully answered. Patient instructed to return to ED, call 911, or call MD for any changes in condition.  Patient escorted  via WC, and D/C home via private auto.  Eda Keys, RN 03/30/2019 3:10 PM

## 2019-03-30 NOTE — Progress Notes (Signed)
Pre HD Tx    03/30/19 1020  Hand-Off documentation  Report given to (Full Name) Trellis Paganini RN  Report received from (Full Name) Janett Billow 2C RN  Vital Signs  Temp 98.6 F (37 C)  Temp Source Oral  Pulse Rate 82  Pulse Rate Source Monitor  Resp 20  BP (!) 168/82  BP Location Right Arm  BP Method Automatic  Patient Position (if appropriate) Lying  Oxygen Therapy  SpO2 98 %  O2 Device Room Air  Pain Assessment  Pain Scale 0-10  Pain Score 0  Dialysis Weight  Weight 89.7 kg  Type of Weight Pre-Dialysis  Time-Out for Hemodialysis  What Procedure? hemodialysis  Pt Identifiers(min of two) First/Last Name;MRN/Account#  Correct Site? Yes  Correct Side? Yes  Correct Procedure? Yes  Consents Verified? Yes  Rad Studies Available? N/A  Safety Precautions Reviewed? Yes  Engineer, civil (consulting) Number 4  Station Number 4  UF/Alarm Test Passed  Conductivity: Meter 14  Conductivity: Machine  14  pH 7.2  Reverse Osmosis Main  Normal Saline Lot Number [id number]  Dialyzer Lot Number [id number]  Disposable Set Lot Number [id number]  Machine Temperature 98.6 F (37 C)  Musician and Audible Yes  Blood Lines Intact and Secured Yes  Pre Treatment Patient Checks  Vascular access used during treatment Catheter  Hepatitis B Surface Antigen Results Negative  Date Hepatitis B Surface Antigen Drawn 03/27/19  Hepatitis B Surface Antibody  (<10)  Date Hepatitis B Surface Antibody Drawn 03/27/19  Hemodialysis Consent Verified Yes  Hemodialysis Standing Orders Initiated Yes  ECG (Telemetry) Monitor On Yes  Prime Ordered Normal Saline  Length of  DialysisTreatment -hour(s) 3 Hour(s)  Dialysis Treatment Comments Na140  Dialyzer Elisio 17H NR  Dialysate 2K, 2.5 Ca  Dialysate Flow Ordered 600  Blood Flow Rate Ordered 300 mL/min  Ultrafiltration Goal 1.5 Liters  Dialysis Blood Pressure Support Ordered Normal Saline  Education / Care Plan  Dialysis Education Provided Yes  Documented  Education in Care Plan Yes  Hemodialysis Catheter  Placement Date: 03/27/19    Site Condition No complications  Blue Lumen Status Capped (Central line)  Red Lumen Status Capped (Central line)  Dressing Type Occlusive  Dressing Status Clean;Dry;Intact  Drainage Description None

## 2019-03-30 NOTE — Progress Notes (Signed)
Post HD Assessment  878mL removed, pt signed off early AMA, MD aware.    03/30/19 1224  Neurological  Level of Consciousness Alert  Orientation Level Oriented X4  Respiratory  Respiratory Pattern Regular  Chest Assessment Chest expansion symmetrical  Bilateral Breath Sounds Diminished;Rhonchi  Cardiac  Pulse Regular  Heart Sounds S1, S2  Jugular Venous Distention (JVD) No  ECG Monitor Yes  Cardiac Rhythm NSR;BBB  Ectopy Ventricular tachycardia, non-sustained;Unifocal PVC's  Ectopy Frequency Occasional  Antiarrhythmic device No  Vascular  R Radial Pulse +2  L Radial Pulse +2  Edema Right upper extremity;Left upper extremity;Right lower extremity;Left lower extremity  Generalized Edema +1  RUE Edema Non-pitting  LUE Edema Non-pitting  RLE Edema Non-pitting  LLE Edema Non-Pitting  Integumentary  Integumentary (WDL) WDL  Skin Condition Dry;Flaky  Musculoskeletal  Musculoskeletal (WDL) WDL  Psychosocial  Psychosocial (WDL) X  Patient Behaviors Calm;Cooperative  Needs Expressed Emotional ("I want to go home")  Emotional support given Given to patient

## 2019-03-30 NOTE — Progress Notes (Signed)
Attempted to call pt's wife and discuss discharge information with her. No answer at phone number listed on chart. Will attempt at a later time.

## 2019-03-30 NOTE — TOC Transition Note (Signed)
Transition of Care [facility]) - CM/SW Discharge Note   Patient Details  Name: Devin Harmon MRN: [id number] Date of Birth: 07/14/1956  Transition of Care [facility] CM/SW Contact:  Shela Leff, LCSW Phone Number: 03/30/2019, 4:18 PM   Clinical Narrative:   Please refer to [facility] documentation from previous note today. Patient returning home with hospice. [facility] is the agency.    Final next level of care: Home w Hospice Care Barriers to Discharge: No Barriers Identified   Patient Goals and CMS Choice   CMS Medicare.gov Compare Post Acute Care list provided to:: Patient Represenative (must comment) Choice offered to / list presented to : Spouse  Discharge Placement                       Discharge Plan and Services                                     Social Determinants of Health (SDOH) Interventions     Readmission Risk Interventions No flowsheet data found.

## 2019-03-30 NOTE — Clinical Social Work Note (Signed)
CSW spoke with nephrology who stated patient is currently refusing any further dialysis and wishes to return home today. Physicians have explained to both patient and his wife that this would result in needing hospice care at home. Initially when CSW contacted patient's wife, and she had stated when she spoke with nephrology this morning a decision had not been made and that she and patient were planning on making their decision at some point after discharge. CSW explained that this might end up with another hospitalization as without dialysis, he will retain fluid. She wanted to make sure nephrology had contacted Dr. Silvio Pate today to inform him of everything going on. CSW informed the wife that CSW would contact nephrology and either myself or the physician would call her back. CSW contacted nephrology and nephrology called patient's wife again. Nephrology then informed CSW that patient's wife was agreeable to go ahead and arrange hospice at home. CSW contacted patient's wife again and offered her hospice choice and she chose [facility]. Referral made to Santiago Glad with [facility]. Patient to discharge today and will be transported by his wife.  Shela Leff MSW,LcSW [phone]

## 2019-03-30 NOTE — Progress Notes (Signed)
Hydrologist [facility])  Hospital Liaison: RN note    Notified by Shela Leff, CSW of patient/family request for [facility] services at home after discharge. Chart and patient information under review by [facility] physician. Hospice eligibility pending at this time.     Writer spoke with wife, [place] by phone to initiate education related to hospice philosophy, services and team approach to care. [place]  verbalized understanding of information given. Per discussion, plan is for discharge to home by private vehicle.     Please send signed and completed DNR form home with patient/family. Patient will need prescriptions for discharge comfort medications.     DME needs have been discussed, patient currently does not need any DME.    [facility] aware of the above. Please notify [facility] when patient is ready to leave the unit at discharge. (Call [phone] or [phone] after 5pm.) [facility] information and contact numbers given to [place].      Please call with any hospice related questions.     Thank you for this referral.     Farrel Gordon, RN, CCM  [place] (listed on [facility] under Hospice and [place] [facility] [place])  [phone]

## 2019-03-30 NOTE — Progress Notes (Signed)
[facility]  ROUNDING NOTE   Subjective:  Patient seen and evaluated during dialysis treatment again today. He appears to be tolerating the treatment well. We had a long discussion today about the potential for renal replacement therapy as an outpatient. Patient is not interested in outpatient hemodialysis. Patient was advised that he has end-stage renal disease and his symptoms are likely to recur without dialysis. Patient believes that his symptoms will not recur.   Objective:  Vital signs in last 24 hours:  Temp:  [98.1 F (36.7 C)-98.6 F (37 C)] 98.6 F (37 C) (04/29 1020) Pulse Rate:  [62-93] 79 (04/29 1030) Resp:  [12-26] 22 (04/29 1030) BP: (141-187)/(60-109) 168/82 (04/29 1020) SpO2:  [93 %-99 %] 99 % (04/29 1030) Weight:  [89.7 kg] 89.7 kg (04/29 1020)  Weight change:  Filed Weights   03/29/19 0900 03/29/19 1215 03/30/19 1020  Weight: 91 kg 89.7 kg 89.7 kg    Intake/Output: I/O last 3 completed shifts: In: 240 [P.O.:240] Out: 3818 [Urine:300; Other:1006]   Intake/Output this shift:  No intake/output data recorded.  Physical Exam: General: No acute distress  Head: Normocephalic, atraumatic. Moist oral mucosal membranes  Eyes: Anicteric  Neck: Supple, trachea midline  Lungs:  CTAB, normal effort  Heart: S1S2 no obvious rub  Abdomen:  Soft, nontender, bowel sounds present  Extremities: Trace peripheral edema.  Neurologic: Awake, alert, follows commands  Skin: No lesions  Access: Left internal jugular temporary dialysis catheter    Basic Metabolic Panel: Recent Labs  Lab 03/27/19 1040 03/27/19 1727 03/27/19 2010 03/28/19 0515 03/29/19 0515  NA 134*  --  138 138 142  K 5.4*  --  4.3 4.5 4.2  CL 103  --  101 102 105  CO2 12*  --  16* 20* 23  GLUCOSE 113*  --  102* 115* 108*  BUN 145*  --  92* 115* 87*  CREATININE 18.91*  --  13.86* 15.20* 11.48*  CALCIUM 6.7*  --  7.5* 7.1* 7.3*  MG 2.0  --  1.8 2.0 1.8  PHOS  --  7.6* 7.3* 9.1*  6.4*    Liver Function Tests: Recent Labs  Lab 03/27/19 1040 03/27/19 2010 03/28/19 1135  AST 10* 11* 21  ALT 10 10 12   ALKPHOS 55 58 51  BILITOT 0.6 1.0 0.9  PROT 7.4 6.7 6.5  ALBUMIN 3.9 3.8 3.3*   No results for input(s): LIPASE, AMYLASE in the last 168 hours. Recent Labs  Lab 03/28/19 1134  AMMONIA 15    CBC: Recent Labs  Lab 03/27/19 1040 03/27/19 2010 03/28/19 0515 03/28/19 1610 03/29/19 0515  WBC 11.0* 10.2 8.3  --  8.7  NEUTROABS 9.2*  --   --   --  6.6  HGB 8.0* 7.5* 6.8* 8.3* 8.5*  HCT 24.6* 22.5* 20.6* 24.8* 25.6*  MCV 82.0 80.4 81.1  --  82.8  PLT 314 267 237  --  234    Cardiac Enzymes: Recent Labs  Lab 03/27/19 2010  TROPONINI 0.05*    BNP: Invalid input(s): POCBNP  CBG: Recent Labs  Lab 03/27/19 [place]*    Microbiology: Results for orders placed or performed during the hospital encounter of 03/27/19  SARS Coronavirus 2 [facility] order, Performed in [place] hospital lab)     Status: None   Collection Time: 03/27/19 10:55 AM  Result Value Ref Range Status   SARS Coronavirus 2 NEGATIVE NEGATIVE Final    Comment: (NOTE) If result is NEGATIVE SARS-CoV-2 target  nucleic acids are NOT DETECTED. The SARS-CoV-2 RNA is generally detectable in upper and lower  respiratory specimens during the acute phase of infection. The lowest  concentration of SARS-CoV-2 viral copies this assay can detect is 250  copies / mL. A negative result does not preclude SARS-CoV-2 infection  and should not be used as the sole basis for treatment or other  patient management decisions.  A negative result may occur with  improper specimen collection / handling, submission of specimen other  than nasopharyngeal swab, presence of viral mutation(s) within the  areas targeted by this assay, and inadequate number of viral copies  (<250 copies / mL). A negative result must be combined with clinical  observations, patient history, and epidemiological  information. If result is POSITIVE SARS-CoV-2 target nucleic acids are DETECTED. The SARS-CoV-2 RNA is generally detectable in upper and lower  respiratory specimens dur ing the acute phase of infection.  Positive  results are indicative of active infection with SARS-CoV-2.  Clinical  correlation with patient history and other diagnostic information is  necessary to determine patient infection status.  Positive results do  not rule out bacterial infection or co-infection with other viruses. If result is PRESUMPTIVE POSTIVE SARS-CoV-2 nucleic acids MAY BE PRESENT.   A presumptive positive result was obtained on the submitted specimen  and confirmed on repeat testing.  While 2019 novel coronavirus  (SARS-CoV-2) nucleic acids may be present in the submitted sample  additional confirmatory testing may be necessary for epidemiological  and / or clinical management purposes  to differentiate between  SARS-CoV-2 and other Sarbecovirus currently known to infect humans.  If clinically indicated additional testing with an alternate test  methodology [phone]) is advised. The SARS-CoV-2 RNA is generally  detectable in upper and lower respiratory sp ecimens during the acute  phase of infection. The expected result is Negative. Fact Sheet for Patients:  StrictlyIdeas.no Fact Sheet for Healthcare Providers: BankingDealers.co.za This test is not yet approved or cleared by the [place] FDA and has been authorized for detection and/or diagnosis of SARS-CoV-2 by FDA under an Emergency Use Authorization (EUA).  This EUA will remain in effect (meaning this test can be used) for the duration of the COVID-19 declaration under Section 564(b)(1) of the Act, 21 U.S.C. section 360bbb-3(b)(1), unless the authorization is terminated or revoked sooner. Performed at [facility], [address] [place], [place] [address]   Blood Culture (routine x  2)     Status: None (Preliminary result)   Collection Time: 03/27/19 10:56 AM  Result Value Ref Range Status   Specimen Description BLOOD RFA  Final   Special Requests   Final    BOTTLES DRAWN AEROBIC AND ANAEROBIC Blood Culture results may not be optimal due to an excessive volume of blood received in culture bottles   Culture   Final    NO GROWTH 3 DAYS Performed at [facility], [address] [place], [place] [address]    Report Status PENDING  Incomplete  Blood Culture (routine x 2)     Status: None (Preliminary result)   Collection Time: 03/27/19 11:00 AM  Result Value Ref Range Status   Specimen Description BLOOD RH  Final   Special Requests   Final    BOTTLES DRAWN AEROBIC AND ANAEROBIC Blood Culture adequate volume   Culture   Final    NO GROWTH 3 DAYS Performed at [facility], [address] [place], [place] [address]    Report Status PENDING  Incomplete  MRSA PCR Screening     Status: None   Collection Time: 03/27/19  2:41 PM  Result Value Ref Range Status   MRSA by PCR NEGATIVE NEGATIVE Final    Comment:        The GeneXpert MRSA Assay (FDA approved for NASAL specimens only), is one component of a comprehensive MRSA colonization surveillance program. It is not intended to diagnose MRSA infection nor to guide or monitor treatment for MRSA infections. Performed at [facility], [address] [place], [place] [address]     Coagulation Studies: No results for input(s): LABPROT, INR in the last 72 hours.  Urinalysis: No results for input(s): COLORURINE, LABSPEC, PHURINE, GLUCOSEU, HGBUR, BILIRUBINUR, KETONESUR, PROTEINUR, UROBILINOGEN, NITRITE, LEUKOCYTESUR in the last 72 hours.  Invalid input(s): APPERANCEUR    Imaging: US Renal  Result Date: 03/28/2019 CLINICAL DATA:  End-stage renal disease. EXAM: RENAL / URINARY TRACT ULTRASOUND COMPLETE COMPARISON:  CT abdomen pelvis dated August 28, 2018. FINDINGS: Right Kidney:  Renal measurements: 8.7 x 3.7 x 4.0 cm = volume: 68 mL. Increased echogenicity. No mass or hydronephrosis visualized. Small 1.1 cm exophytic simple cyst arising from the midpole. Left Kidney: Renal measurements: 8.1 x 5.7 x 4.9 cm = volume: 1 at the mL. Increased echogenicity. No mass or hydronephrosis visualized. Bladder: Appears normal for degree of bladder distention. IMPRESSION: 1. Bilateral increased renal echogenicity, consistent with medical renal disease. No acute abnormality. Electronically Signed   By: Titus Dubin M.D.   On: 03/28/2019 17:04     Medications:   . calcium gluconate     . amLODipine  10 mg Oral QPM  . Chlorhexidine Gluconate Cloth  6 each Topical Q0600  . feeding supplement (NEPRO CARB STEADY)  237 mL Oral BID BM  . fluticasone  1-2 spray Each Nare Daily  . labetalol  100 mg Oral QHS  . loratadine  10 mg Oral Daily  . mouth rinse  15 mL Mouth Rinse q12n4p  . minoxidil  5 mg Oral Daily  . multivitamin  1 tablet Oral QHS  . sodium bicarbonate  650 mg Oral TID   acetaminophen **OR** acetaminophen, diazepam, hydrALAZINE, ondansetron **OR** ondansetron (ZOFRAN) IV, polyethylene glycol  Assessment/ Plan:  63 y.o. male  with hypertension, BPH, advanced CKD, was admitted on 03/27/2019 with uremia and acute pulmonary edema.   1.  End-stage renal disease 2.  Acute pulmonary edema 3.  Severe hypertension 4.  Lower extremity edema 5.  Severe acidosis. 6.  Anemia, likely of chronic kidney disease 7.  2D echo from September 2019 show moderate concentric left ventricular hypertrophy, LVEF 55 to 30%, grade 1 diastolic dysfunction, severely dilated left atrium  Plan: We again had a very long discussion with the patient.  We advised him that he has end-stage renal disease and in order to have the best health possible now he would need outpatient hemodialysis.  However patient declines this multiple times.  His main goal is to get back home.  We advised the patient that his  symptoms are likely to recur at some point after going home.  Patient's belief is that the symptoms will not recur again.  We have offered hospice services to the patient.  We also discussed the case with the patient's wife as well as Dr. Silvio Pate.  They are in agreement that patient has declined dialysis services previously.  Therefore it is certainly reasonable to send home with hospice at this point in time.  We will contact the hospice  liaison to set this up.  However our underlying concern is that once patient becomes short of breath again he will likely present again acutely.  We plan to complete dialysis treatment today and we will thereafter remove temporary dialysis catheter upon his request.  Thanks for allowing [place] to participate in his care.   LOS: 3 Blair [place]ina 4/29/202011:27 AM

## 2019-03-31 ENCOUNTER — Telehealth: Payer: Self-pay

## 2019-03-31 ENCOUNTER — Telehealth: Payer: Self-pay | Admitting: Internal Medicine

## 2019-03-31 NOTE — Telephone Encounter (Signed)
I spoke with Amber concerning hospice care plans for patient. Larene Beach confirmed you would be the attending of record physician for patient for hospice care orders. Amber is aware and will fax orders as needed. They will visit pt after 5/4 3:15pm appt.  [employer] [phone]

## 2019-03-31 NOTE — Telephone Encounter (Signed)
Okay Not sure he will agree to ongoing hospice care but I plan to basically insist at his hospital follow up appt

## 2019-03-31 NOTE — Telephone Encounter (Signed)
Patient discharged from [facility] with dx:   Acute respiratory failure with hypoxia [facility])  Per discharge summary:    Follow-Ups: Follow up with Venia Carbon, MD (Internal Medicine) in 1 week (04/06/2019)    Transitional Care Management Follow-up Telephone Call    Date discharged? 03/30/19  How have you been since you were released from the hospital? [place].   Any patient concerns? None   Items Reviewed:  Medications reviewed: Yes  Allergies reviewed: Yes  Dietary changes reviewed: Yes  Referrals reviewed: Yes   Functional Questionnaire:  Independent - I Dependent - D    Activities of Daily Living (ADLs):    Personal hygiene - I Dressing - I Eating - I Maintaining continence - I Transferring - I   Independent Activities of Daily Living (iADLs): Basic communication skills - I Transportation - I Meal preparation  - I Shopping - I Housework - I Managing medications - D, spouse manages medications  Managing personal finances - I   Confirmed importance and date/time of follow-up visits scheduled YES  Provider Appointment booked with PCP 5/4 @ 1515  Confirmed with patient if condition begins to worsen call PCP or go to the ER.  Patient was given the office number and encouraged to call back with question or concerns: YES

## 2019-04-01 LAB — CULTURE, BLOOD (ROUTINE X 2)
Culture: NO GROWTH
Culture: NO GROWTH
Special Requests: ADEQUATE

## 2019-04-04 ENCOUNTER — Other Ambulatory Visit: Payer: Self-pay

## 2019-04-04 ENCOUNTER — Encounter: Payer: Self-pay | Admitting: Internal Medicine

## 2019-04-04 ENCOUNTER — Ambulatory Visit: Payer: 59 | Admitting: Internal Medicine

## 2019-04-04 VITALS — BP 130/72 | HR 59 | Temp 97.7°F | Ht 68.0 in | Wt 167.0 lb

## 2019-04-04 DIAGNOSIS — I10 Essential (primary) hypertension: Secondary | ICD-10-CM | POA: Diagnosis not present

## 2019-04-04 DIAGNOSIS — J810 Acute pulmonary edema: Secondary | ICD-10-CM

## 2019-04-04 DIAGNOSIS — N185 Chronic kidney disease, stage 5: Secondary | ICD-10-CM

## 2019-04-04 NOTE — Assessment & Plan Note (Signed)
BP Readings from Last 3 Encounters:  04/04/19 130/72  03/30/19 (!) 150/92  02/10/19 (!) 156/80   BP is better now Likely due to the dialysis he received in the hospital

## 2019-04-04 NOTE — Progress Notes (Signed)
Subjective:    Patient ID: Devin Harmon, male    DOB: 1956-02-07, 63 y.o.   MRN: [id number]  HPI Here with wife for hospital follow up  Reviewed course Sudden SOB Hadn't noticed weight gain ---wife checked every few days They did notice swelling and some bruising Was in acute pulmonary edema He claims he didn't agree to the dialysis Apparently had subclavian line placed for dialysis He doesn't remember much of what happened--sounds like he had delirium He then asked not to continue--so he was sent home Hospice referral made---appt for tomorrow was set up  Reviewed labs Discussed case with Dr Lateef---nephrology  Breathing is better Less cough now Edema is much better--still some He does feel better now  Current Outpatient Medications on File Prior to Visit  Medication Sig Dispense Refill  . acetaminophen (TYLENOL) 500 MG tablet Take 500-1,000 mg by mouth every 6 (six) hours as needed for mild pain.    Marland Kitchen amLODipine (NORVASC) 10 MG tablet Take 1 tablet (10 mg total) by mouth daily. (Patient taking differently: Take 10 mg by mouth every evening. ) 30 tablet 11  . cetirizine (ZYRTEC) 10 MG tablet Take 10 mg by mouth every evening.     . fluticasone (FLONASE) 50 MCG/ACT nasal spray Place 1-2 sprays into both nostrils daily.    Marland Kitchen labetalol (NORMODYNE) 100 MG tablet Take 1 tablet (100 mg total) by mouth 2 (two) times daily. (Patient taking differently: Take 100 mg by mouth at bedtime. ) 60 tablet 11  . minoxidil (LONITEN) 2.5 MG tablet Take 2 tablets (5 mg total) by mouth daily. 30 tablet 0  . Multiple Vitamin (MULTIVITAMIN WITH MINERALS) TABS tablet Take 1 tablet by mouth daily.    . sodium bicarbonate 650 MG tablet Take 1 tablet (650 mg total) by mouth 3 (three) times daily. 270 tablet 3  . terazosin (HYTRIN) 10 MG capsule Take 1 capsule (10 mg total) by mouth at bedtime. (Patient taking differently: Take 10 mg by mouth at bedtime as needed. ) 30 capsule 11   No current  facility-administered medications on file prior to visit.     Allergies  Allergen Reactions  . Chlorthalidone     REACTION: felt "bad" and ED  . Hydrocodone Other (See Comments)    Nausea and dizzy  . Sulfonamide Derivatives     REACTION: unspecified    Past Medical History:  Diagnosis Date  . Allergy   . BPH (benign prostatic hypertrophy)   . Hypertension   . Renal insufficiency   . Zoster 2007   facial, hospital 2007    History reviewed. No pertinent surgical history.  Family History  Problem Relation Age of Onset  . Hypertension Mother   . Cancer Mother        lung cancer  . Hypertension Brother   . Heart disease Father   . Coronary artery disease Neg Hx   . Diabetes Neg Hx     Social History   Socioeconomic History  . Marital status: Married    Spouse name: Not on file  . Number of children: 2  . Years of education: Not on file  . Highest education level: Not on file  Occupational History  . Occupation: Museum/gallery curator, building new homes  Social Needs  . Financial resource strain: Not on file  . Food insecurity:    Worry: Not on file    Inability: Not on file  . Transportation needs:    Medical: Not on file  Non-medical: Not on file  Tobacco Use  . Smoking status: Never Smoker  . Smokeless tobacco: Never Used  Substance and Sexual Activity  . Alcohol use: Yes    Comment: occasional  . Drug use: Not Currently  . Sexual activity: Not on file  Lifestyle  . Physical activity:    Days per week: Not on file    Minutes per session: Not on file  . Stress: Not on file  Relationships  . Social connections:    Talks on phone: Not on file    Gets together: Not on file    Attends religious service: Not on file    Active member of club or organization: Not on file    Attends meetings of clubs or organizations: Not on file    Relationship status: Not on file  . Intimate partner violence:    Fear of current or ex partner: Not on file    Emotionally abused:  Not on file    Physically abused: Not on file    Forced sexual activity: Not on file  Other Topics Concern  . Not on file  Social History Narrative  . Not on file   Review of Systems Appetite is good Wife tries to limit salt and potassium Bpwels are okay    Objective:   Physical Exam  Constitutional: He appears well-developed. No distress.  Neck: No thyromegaly present.  Cardiovascular: Normal rate, regular rhythm and normal heart sounds. Exam reveals no gallop.  No murmur heard. Respiratory: Effort normal and breath sounds normal. No respiratory distress. He has no wheezes. He has no rales.  No dullness  GI: Soft. There is no abdominal tenderness.  Musculoskeletal:        General: No edema.  Lymphadenopathy:    He has no cervical adenopathy.  Psychiatric: He has a normal mood and affect. His behavior is normal.           Assessment & Plan:

## 2019-04-04 NOTE — Assessment & Plan Note (Signed)
Went into pulmonary edema This caused him great distress and he has had some reconsideration of the dialysis He now feels he is willing to give it a try Wife is relieved and I told him how happy I am about it---he can still reconsider Will contact Dr Judene Companion need different nephrologist if he wants to do dialysis in [place] Will need temporary access again and see vascular surgeon about permanent access, etc

## 2019-04-06 ENCOUNTER — Ambulatory Visit: Payer: 59 | Admitting: Internal Medicine

## 2019-04-12 ENCOUNTER — Emergency Department: Payer: 59

## 2019-04-12 ENCOUNTER — Inpatient Hospital Stay: Payer: 59

## 2019-04-12 ENCOUNTER — Inpatient Hospital Stay
Admission: EM | Admit: 2019-04-12 | Discharge: 2019-04-17 | DRG: 208 | Disposition: A | Discharge status: Home / Self Care | Payer: 59 | Attending: Specialist | Admitting: Specialist

## 2019-04-12 ENCOUNTER — Other Ambulatory Visit: Payer: Self-pay

## 2019-04-12 DIAGNOSIS — Z1159 Encounter for screening for other viral diseases: Secondary | ICD-10-CM

## 2019-04-12 DIAGNOSIS — E875 Hyperkalemia: Secondary | ICD-10-CM | POA: Diagnosis not present

## 2019-04-12 DIAGNOSIS — G9340 Encephalopathy, unspecified: Secondary | ICD-10-CM | POA: Diagnosis present

## 2019-04-12 DIAGNOSIS — E872 Acidosis: Secondary | ICD-10-CM | POA: Diagnosis present

## 2019-04-12 DIAGNOSIS — R319 Hematuria, unspecified: Secondary | ICD-10-CM | POA: Diagnosis not present

## 2019-04-12 DIAGNOSIS — Z885 Allergy status to narcotic agent status: Secondary | ICD-10-CM | POA: Diagnosis not present

## 2019-04-12 DIAGNOSIS — I10 Essential (primary) hypertension: Secondary | ICD-10-CM | POA: Diagnosis not present

## 2019-04-12 DIAGNOSIS — J9600 Acute respiratory failure, unspecified whether with hypoxia or hypercapnia: Secondary | ICD-10-CM | POA: Diagnosis not present

## 2019-04-12 DIAGNOSIS — Z7951 Long term (current) use of inhaled steroids: Secondary | ICD-10-CM

## 2019-04-12 DIAGNOSIS — I509 Heart failure, unspecified: Secondary | ICD-10-CM | POA: Diagnosis not present

## 2019-04-12 DIAGNOSIS — J810 Acute pulmonary edema: Secondary | ICD-10-CM | POA: Diagnosis not present

## 2019-04-12 DIAGNOSIS — J9601 Acute respiratory failure with hypoxia: Secondary | ICD-10-CM | POA: Diagnosis not present

## 2019-04-12 DIAGNOSIS — N179 Acute kidney failure, unspecified: Secondary | ICD-10-CM | POA: Diagnosis not present

## 2019-04-12 DIAGNOSIS — T8383XA Hemorrhage of genitourinary prosthetic devices, implants and grafts, initial encounter: Secondary | ICD-10-CM | POA: Diagnosis not present

## 2019-04-12 DIAGNOSIS — Z882 Allergy status to sulfonamides status: Secondary | ICD-10-CM

## 2019-04-12 DIAGNOSIS — N186 End stage renal disease: Secondary | ICD-10-CM

## 2019-04-12 DIAGNOSIS — R600 Localized edema: Secondary | ICD-10-CM

## 2019-04-12 DIAGNOSIS — D631 Anemia in chronic kidney disease: Secondary | ICD-10-CM | POA: Diagnosis present

## 2019-04-12 DIAGNOSIS — I120 Hypertensive chronic kidney disease with stage 5 chronic kidney disease or end stage renal disease: Secondary | ICD-10-CM | POA: Diagnosis present

## 2019-04-12 DIAGNOSIS — Z8249 Family history of ischemic heart disease and other diseases of the circulatory system: Secondary | ICD-10-CM | POA: Diagnosis not present

## 2019-04-12 DIAGNOSIS — I340 Nonrheumatic mitral (valve) insufficiency: Secondary | ICD-10-CM | POA: Diagnosis not present

## 2019-04-12 DIAGNOSIS — Y738 Miscellaneous gastroenterology and urology devices associated with adverse incidents, not elsewhere classified: Secondary | ICD-10-CM | POA: Diagnosis not present

## 2019-04-12 DIAGNOSIS — Z20828 Contact with and (suspected) exposure to other viral communicable diseases: Secondary | ICD-10-CM | POA: Diagnosis present

## 2019-04-12 DIAGNOSIS — J9690 Respiratory failure, unspecified, unspecified whether with hypoxia or hypercapnia: Secondary | ICD-10-CM

## 2019-04-12 DIAGNOSIS — Z888 Allergy status to other drugs, medicaments and biological substances status: Secondary | ICD-10-CM

## 2019-04-12 DIAGNOSIS — Z4659 Encounter for fitting and adjustment of other gastrointestinal appliance and device: Secondary | ICD-10-CM

## 2019-04-12 DIAGNOSIS — R0602 Shortness of breath: Secondary | ICD-10-CM | POA: Diagnosis not present

## 2019-04-12 DIAGNOSIS — Z01818 Encounter for other preprocedural examination: Secondary | ICD-10-CM

## 2019-04-12 DIAGNOSIS — J9602 Acute respiratory failure with hypercapnia: Secondary | ICD-10-CM | POA: Diagnosis present

## 2019-04-12 DIAGNOSIS — N400 Enlarged prostate without lower urinary tract symptoms: Secondary | ICD-10-CM | POA: Diagnosis present

## 2019-04-12 DIAGNOSIS — R143 Flatulence: Secondary | ICD-10-CM | POA: Diagnosis not present

## 2019-04-12 LAB — BLOOD GAS, ARTERIAL
Acid-base deficit: 8.4 mmol/L — ABNORMAL HIGH (ref 0.0–2.0)
Bicarbonate: 17.9 mmol/L — ABNORMAL LOW (ref 20.0–28.0)
FIO2: 0.4
MECHVT: 500 mL
O2 Saturation: 99.1 %
PEEP: 5 cmH2O
Patient temperature: 37
RATE: 16 resp/min
pCO2 arterial: 39 mmHg (ref 32.0–48.0)
pH, Arterial: 7.27 — ABNORMAL LOW (ref 7.350–7.450)
pO2, Arterial: 155 mmHg — ABNORMAL HIGH (ref 83.0–108.0)

## 2019-04-12 LAB — CBC WITH DIFFERENTIAL/PLATELET
Abs Immature Granulocytes: 0.05 10*3/uL (ref 0.00–0.07)
Basophils Absolute: 0.1 10*3/uL (ref 0.0–0.1)
Basophils Relative: 1 %
Eosinophils Absolute: 0.2 10*3/uL (ref 0.0–0.5)
Eosinophils Relative: 3 %
HCT: 24.4 % — ABNORMAL LOW (ref 39.0–52.0)
Hemoglobin: 7.8 g/dL — ABNORMAL LOW (ref 13.0–17.0)
Immature Granulocytes: 1 %
Lymphocytes Relative: 11 %
Lymphs Abs: 0.8 10*3/uL (ref 0.7–4.0)
MCH: 27.2 pg (ref 26.0–34.0)
MCHC: 32 g/dL (ref 30.0–36.0)
MCV: 85 fL (ref 80.0–100.0)
Monocytes Absolute: 0.5 10*3/uL (ref 0.1–1.0)
Monocytes Relative: 6 %
Neutro Abs: 6.2 10*3/uL (ref 1.7–7.7)
Neutrophils Relative %: 78 %
Platelets: 287 10*3/uL (ref 150–400)
RBC: 2.87 MIL/uL — ABNORMAL LOW (ref 4.22–5.81)
RDW: 15.5 % (ref 11.5–15.5)
WBC: 7.8 10*3/uL (ref 4.0–10.5)
nRBC: 0 % (ref 0.0–0.2)

## 2019-04-12 LAB — COMPREHENSIVE METABOLIC PANEL
ALT: 17 U/L (ref 0–44)
AST: 16 U/L (ref 15–41)
Albumin: 3.9 g/dL (ref 3.5–5.0)
Alkaline Phosphatase: 47 U/L (ref 38–126)
Anion gap: 18 — ABNORMAL HIGH (ref 5–15)
BUN: 115 mg/dL — ABNORMAL HIGH (ref 8–23)
CO2: 15 mmol/L — ABNORMAL LOW (ref 22–32)
Calcium: 7.4 mg/dL — ABNORMAL LOW (ref 8.9–10.3)
Chloride: 105 mmol/L (ref 98–111)
Creatinine, Ser: 14.93 mg/dL — ABNORMAL HIGH (ref 0.61–1.24)
GFR calc Af Amer: 4 mL/min — ABNORMAL LOW (ref >60)
GFR calc non Af Amer: 3 mL/min — ABNORMAL LOW (ref >60)
Glucose, Bld: 113 mg/dL — ABNORMAL HIGH (ref 70–99)
Potassium: 5.2 mmol/L — ABNORMAL HIGH (ref 3.5–5.1)
Sodium: 138 mmol/L (ref 135–145)
Total Bilirubin: 0.5 mg/dL (ref 0.3–1.2)
Total Protein: 7.1 g/dL (ref 6.5–8.1)

## 2019-04-12 LAB — PHOSPHORUS: Phosphorus: 5.5 mg/dL — ABNORMAL HIGH (ref 2.5–4.6)

## 2019-04-12 LAB — IRON AND TIBC
Iron: 22 ug/dL — ABNORMAL LOW (ref 45–182)
Saturation Ratios: 7 % — ABNORMAL LOW (ref 17.9–39.5)
TIBC: 299 ug/dL (ref 250–450)
UIBC: 278 ug/dL

## 2019-04-12 LAB — GLUCOSE, CAPILLARY: Glucose-Capillary: 107 mg/dL — ABNORMAL HIGH (ref 70–99)

## 2019-04-12 LAB — MRSA PCR SCREENING: MRSA by PCR: NEGATIVE

## 2019-04-12 LAB — FERRITIN: Ferritin: 447 ng/mL — ABNORMAL HIGH (ref 24–336)

## 2019-04-12 LAB — TROPONIN I: Troponin I: <0.03 ng/mL (ref <0.03)

## 2019-04-12 LAB — SARS CORONAVIRUS 2 BY RT PCR (HOSPITAL ORDER, PERFORMED IN ~~LOC~~ HOSPITAL LAB): SARS Coronavirus 2: NEGATIVE

## 2019-04-12 MED ORDER — NITROGLYCERIN 0.4 MG SL SUBL
0.4000 mg | SUBLINGUAL_TABLET | SUBLINGUAL | Status: DC | PRN
  Administered 2019-04-12: 0.4 mg via SUBLINGUAL
  Filled 2019-04-12: qty 1

## 2019-04-12 MED ORDER — FUROSEMIDE 10 MG/ML IJ SOLN
100.0000 mg | Freq: Once | INTRAVENOUS | Status: AC
  Administered 2019-04-12: 100 mg via INTRAVENOUS
  Filled 2019-04-12: qty 10

## 2019-04-12 MED ORDER — LABETALOL HCL 100 MG PO TABS
100.0000 mg | ORAL_TABLET | Freq: Two times a day (BID) | ORAL | Status: DC
  Administered 2019-04-12 – 2019-04-16 (×7): 100 mg via ORAL
  Filled 2019-04-12 (×12): qty 1

## 2019-04-12 MED ORDER — FENTANYL 2500MCG IN NS 250ML (10MCG/ML) PREMIX INFUSION
0.0000 ug/h | INTRAVENOUS | Status: DC
  Administered 2019-04-12: 100 ug/h via INTRAVENOUS
  Administered 2019-04-13: 200 ug/h via INTRAVENOUS
  Filled 2019-04-12 (×2): qty 250

## 2019-04-12 MED ORDER — ORAL CARE MOUTH RINSE
15.0000 mL | OROMUCOSAL | Status: DC
  Administered 2019-04-12 – 2019-04-13 (×7): 15 mL via OROMUCOSAL

## 2019-04-12 MED ORDER — POLYETHYLENE GLYCOL 3350 17 G PO PACK: 17.0000 g | PACK | Freq: Every day | ORAL | Status: DC | PRN

## 2019-04-12 MED ORDER — MIDAZOLAM HCL 2 MG/2ML IJ SOLN
2.0000 mg | INTRAMUSCULAR | Status: DC | PRN
  Administered 2019-04-12 – 2019-04-13 (×2): 2 mg via INTRAVENOUS
  Filled 2019-04-12 (×2): qty 2

## 2019-04-12 MED ORDER — AMLODIPINE BESYLATE 10 MG PO TABS
10.0000 mg | ORAL_TABLET | Freq: Every day | ORAL | Status: DC
  Administered 2019-04-13 – 2019-04-16 (×3): 10 mg via ORAL
  Filled 2019-04-12 (×4): qty 1

## 2019-04-12 MED ORDER — ACETAMINOPHEN 650 MG RE SUPP: 650.0000 mg | Freq: Four times a day (QID) | RECTAL | Status: DC | PRN

## 2019-04-12 MED ORDER — ADULT MULTIVITAMIN W/MINERALS CH
1.0000 | ORAL_TABLET | Freq: Every day | ORAL | Status: DC
  Administered 2019-04-13 – 2019-04-17 (×4): 1 via ORAL
  Filled 2019-04-12 (×4): qty 1

## 2019-04-12 MED ORDER — FENTANYL CITRATE (PF) 100 MCG/2ML IJ SOLN
200.0000 ug | INTRAMUSCULAR | Status: AC
  Administered 2019-04-12: 17:00:00 200 ug via INTRAVENOUS

## 2019-04-12 MED ORDER — MINOXIDIL 2.5 MG PO TABS
5.0000 mg | ORAL_TABLET | Freq: Every day | ORAL | Status: DC
  Administered 2019-04-13 – 2019-04-16 (×3): 5 mg via ORAL
  Filled 2019-04-12 (×6): qty 2

## 2019-04-12 MED ORDER — HYDRALAZINE HCL 20 MG/ML IJ SOLN
10.0000 mg | INTRAMUSCULAR | Status: DC | PRN
  Administered 2019-04-13: 10 mg via INTRAVENOUS
  Filled 2019-04-12: qty 1

## 2019-04-12 MED ORDER — MIDAZOLAM HCL 2 MG/2ML IJ SOLN
4.0000 mg | INTRAMUSCULAR | Status: AC
  Administered 2019-04-12: 4 mg via INTRAVENOUS

## 2019-04-12 MED ORDER — VECURONIUM BROMIDE 10 MG IV SOLR
10.0000 mg | INTRAVENOUS | Status: AC
  Administered 2019-04-12: 10 mg via INTRAVENOUS

## 2019-04-12 MED ORDER — FUROSEMIDE 10 MG/ML IJ SOLN
40.0000 mg | Freq: Once | INTRAMUSCULAR | Status: AC
  Administered 2019-04-12: 40 mg via INTRAVENOUS
  Filled 2019-04-12: qty 4

## 2019-04-12 MED ORDER — HEPARIN SODIUM (PORCINE) 5000 UNIT/ML IJ SOLN
5000.0000 [IU] | Freq: Three times a day (TID) | INTRAMUSCULAR | Status: DC
  Administered 2019-04-12 – 2019-04-17 (×13): 5000 [IU] via SUBCUTANEOUS
  Filled 2019-04-12 (×13): qty 1

## 2019-04-12 MED ORDER — CHLORHEXIDINE GLUCONATE 0.12% ORAL RINSE (MEDLINE KIT)
15.0000 mL | Freq: Two times a day (BID) | OROMUCOSAL | Status: DC
  Administered 2019-04-12 – 2019-04-13 (×2): 15 mL via OROMUCOSAL

## 2019-04-12 MED ORDER — ACETAMINOPHEN 325 MG PO TABS: 650.0000 mg | ORAL_TABLET | Freq: Four times a day (QID) | ORAL | Status: DC | PRN

## 2019-04-12 MED ORDER — MIDAZOLAM HCL 2 MG/2ML IJ SOLN: 2.0000 mg | INTRAMUSCULAR | Status: DC | PRN

## 2019-04-12 MED ORDER — CHLORHEXIDINE GLUCONATE CLOTH 2 % EX PADS
6.0000 | MEDICATED_PAD | Freq: Every day | CUTANEOUS | Status: DC
  Administered 2019-04-13 – 2019-04-17 (×4): 6 via TOPICAL

## 2019-04-12 NOTE — Procedures (Signed)
Endotracheal Intubation: Patient required placement of an artificial airway secondary to Respiratory Failure  Consent: Emergent.   Hand washing performed prior to starting the procedure.   Medications administered for sedation prior to procedure:  Midazolam 4 mg IV,  Vecuronium 10 mg IV, Fentanyl 100 mcg IV.    A time out procedure was called and correct patient, name, & ID confirmed. Needed supplies and equipment were assembled and checked to include ETT, 10 ml syringe, Glidescope, Mac and Miller blades, suction, oxygen and bag mask valve, end tidal CO2 monitor.   Patient was positioned to align the mouth and pharynx to facilitate visualization of the glottis.   Heart rate, SpO2 and blood pressure was continuously monitored during the procedure. Pre-oxygenation was conducted prior to intubation and endotracheal tube was placed through the vocal cords into the trachea.     The artificial airway was placed under direct visualization via glidescope route using a 8.5 ETT on the first attempt.  ETT was secured at 23 cm mark.  Placement was confirmed by auscuitation of lungs with good breath sounds bilaterally and no stomach sounds.  Condensation was noted on endotracheal tube.   Pulse ox 98%.  CO2 detector in place with appropriate color change.   Complications: None .   Operator: Moritz Lever.   Chest radiograph ordered and pending.   Comments: OGT placed via glidescope.  Lonald Troiani David Inri Sobieski, M.D.  [facility]  Medical Director ICU-[facility] Medical Director [facility]       

## 2019-04-12 NOTE — ED Notes (Signed)
Assisted pt with blowing nose and emptied urinal - pt is upset that he is still in the ED and does not have a room yet

## 2019-04-12 NOTE — ED Provider Notes (Addendum)
[facility] Emergency Department Provider Note  ____________________________________________  Time seen: Approximately 11:11 AM  I have reviewed the triage vital signs and the nursing notes.   HISTORY  Chief Complaint Shortness of Breath    HPI Devin Harmon is a 63 y.o. male with a history of hypertension and renal insufficiency who comes to the ED complaining of  [place] worsening shortness of breath and peripheral edema over the past week.  Since yesterday feels like he really cannot breathe, worse lying down supine, better sitting upright.  Worse walking with severely diminished ability to ambulate at home due to shortness of breath.  Denies chest pain fevers chills body aches or sweats or sick contacts.  Symptoms are severe and constant.  Patient had previously been found to have end-stage renal disease, was admitted to the hospital a few weeks ago due to pulmonary edema from volume overload.  At that time he was refusing dialysis and was discharged home for hospice follow-up.  Now with primary care follow-up, he is agreeable to dialysis.   Past Medical History:  Diagnosis Date  . Allergy   . BPH (benign prostatic hypertrophy)   . Hypertension   . Renal insufficiency   . Zoster 2007   facial, hospital 2007     Patient Active Problem List   Diagnosis Date Noted  . Acute respiratory failure with hypoxia ([place]) 03/27/2019  . Allergic rhinitis due to pollen 02/10/2019  . Chronic renal failure, stage 5 (HCC) 08/28/2018  . Hematoma of abdominal wall 10/07/2017  . Routine general medical examination at a health care facility 06/20/2011  . Malignant hypertension 02/28/2010  . BPH without obstruction/lower urinary tract symptoms 01/04/2008  . Essential hypertension, benign 08/19/2007     History reviewed. No pertinent surgical history.   Prior to Admission medications   Medication Sig Start Date End Date Taking? Authorizing Provider  acetaminophen  (TYLENOL) 500 MG tablet Take 500-1,000 mg by mouth every 6 (six) hours as needed for mild pain.   Yes [provider]  amLODipine (NORVASC) 10 MG tablet Take 1 tablet (10 mg total) by mouth daily. 10/13/18  Yes Venia Carbon, MD  cetirizine (ZYRTEC) 10 MG tablet Take 10 mg by mouth every evening.    Yes [provider]  fluticasone (FLONASE) 50 MCG/ACT nasal spray Place 1-2 sprays into both nostrils daily.   Yes [provider]  labetalol (NORMODYNE) 100 MG tablet Take 1 tablet (100 mg total) by mouth 2 (two) times daily. 10/13/18  Yes Venia Carbon, MD  minoxidil (LONITEN) 2.5 MG tablet Take 2 tablets (5 mg total) by mouth daily. 03/31/19  Yes Bettey Costa, MD  Multiple Vitamin (MULTIVITAMIN WITH MINERALS) TABS tablet Take 1 tablet by mouth daily. 09/02/18  Yes Samuella Cota, MD  sodium bicarbonate 650 MG tablet Take 1 tablet (650 mg total) by mouth 3 (three) times daily. 10/08/18  Yes Venia Carbon, MD     Allergies Chlorthalidone; Hydrocodone; and Sulfonamide derivatives   Family History  Problem Relation Age of Onset  . Hypertension Mother   . Cancer Mother        lung cancer  . Hypertension Brother   . Heart disease Father   . Coronary artery disease Neg Hx   . Diabetes Neg Hx     Social History Social History   Tobacco Use  . Smoking status: Never Smoker  . Smokeless tobacco: Never Used  Substance Use Topics  . Alcohol use: Yes  Comment: occasional  . Drug use: Not Currently    Review of Systems  Constitutional:   No fever or chills.  ENT:   No sore throat. No rhinorrhea. Cardiovascular:   No chest pain or syncope. Respiratory: Positive shortness of breath and cough. Gastrointestinal:   Negative for abdominal pain, vomiting and diarrhea.  Musculoskeletal:   Positive bilateral leg swelling. All other systems reviewed and are negative except as documented above in ROS and  HPI.  ____________________________________________   PHYSICAL EXAM:  VITAL SIGNS: ED Triage Vitals  Enc Vitals Group     BP 04/12/19 0916 (!) 181/151     Pulse Rate 04/12/19 0916 77     Resp 04/12/19 0916 (!) 38     Temp 04/12/19 0919 97.9 F (36.6 C)     Temp Source 04/12/19 0919 Oral     SpO2 04/12/19 0916 (!) 86 %     Weight 04/12/19 0916 167 lb (75.8 kg)     Height 04/12/19 0916 5\' 8"  (1.727 m)     Head Circumference --      Peak Flow --      Pain Score 04/12/19 0916 10     Pain Loc --      Pain Edu? --      Excl. in [place]? --     Vital signs reviewed, nursing assessments reviewed.   Constitutional:   Alert and oriented ill-appearing, respiratory distress. Eyes:   Conjunctivae are normal. EOMI. PERRL. ENT      Head:   Normocephalic and atraumatic.      Nose:   No congestion/rhinnorhea.       Mouth/Throat:   MMM, no pharyngeal erythema. No peritonsillar mass.       Neck:   No meningismus. Full ROM. Hematological/Lymphatic/Immunilogical:   No cervical lymphadenopathy. Cardiovascular:   RRR. Symmetric bilateral radial and DP pulses.  No murmurs. Cap refill less than 2 seconds. Respiratory: Tachypnea, increased work of breathing.  Bilateral crackles, worse on the right. Gastrointestinal:   Soft and nontender. Non distended. There is no CVA tenderness.  No rebound, rigidity, or guarding.  Musculoskeletal:   Normal range of motion in all extremities. No joint effusions.  No lower extremity tenderness.  2+ pitting edema bilaterally.  Symmetric.Marland Kitchen Neurologic:   Normal speech and language.  Motor grossly intact. No acute focal neurologic deficits are appreciated.  Skin:    Skin is warm, dry and intact. No rash noted.  No petechiae, purpura, or bullae.  ____________________________________________    LABS (pertinent positives/negatives) (all labs ordered are listed, but only abnormal results are displayed) Labs Reviewed  COMPREHENSIVE METABOLIC PANEL - Abnormal; Notable  for the following components:      Result Value   Potassium 5.2 (*)    CO2 15 (*)    Glucose, Bld 113 (*)    BUN 115 (*)    Creatinine, Ser 14.93 (*)    Calcium 7.4 (*)    GFR calc non Af Amer 3 (*)    GFR calc Af Amer 4 (*)    Anion gap 18 (*)    All other components within normal limits  CBC WITH DIFFERENTIAL/PLATELET - Abnormal; Notable for the following components:   RBC 2.87 (*)    Hemoglobin 7.8 (*)    HCT 24.4 (*)    All other components within normal limits  SARS CORONAVIRUS 2 (HOSPITAL ORDER, [facility])  TROPONIN I   ____________________________________________   EKG  Interpreted by me Sinus  rhythm rate of 77, left axis, normal intervals.  Poor R wave progression.  Normal ST segments and T waves.  No acute ischemic changes.  ____________________________________________    RJJOACZYS  Dg Chest Portable 1 View  Result Date: 04/12/2019 CLINICAL DATA:  Shortness of breath and swelling of the lower extremities, worsening recently. EXAM: PORTABLE CHEST 1 VIEW COMPARISON:  03/27/2019 FINDINGS: Central line has been removed. Mild cardiomegaly and aortic atherosclerosis as seen previously. Widespread pulmonary opacity, left more than right, could represent fluid overload/edema and/or pneumonia. There may be a small amount of pleural fluid on the left. IMPRESSION: Nonspecific widespread bilateral pulmonary density that could represent edema and/or pneumonia. Electronically Signed   By: Nelson Chimes M.D.   On: 04/12/2019 09:56    ____________________________________________   PROCEDURES .Critical Care Performed by: Carrie Mew, MD Authorized by: Carrie Mew, MD   Critical care provider statement:    Critical care time (minutes):  40   Critical care time was exclusive of:  Separately billable procedures and treating other patients   Critical care was necessary to treat or prevent imminent or life-threatening deterioration of the  following conditions:  Respiratory failure   Critical care was time spent personally by me on the following activities:  Development of treatment plan with patient or surrogate, discussions with consultants, evaluation of patient's response to treatment, examination of patient, obtaining history from patient or surrogate, ordering and performing treatments and interventions, ordering and review of laboratory studies, ordering and review of radiographic studies, pulse oximetry, re-evaluation of patient's condition and review of old charts    ____________________________________________  DIFFERENTIAL DIAGNOSIS   Pulmonary edema, pleural effusion, pneumonia, COVID, acute heart failure, less likely PE.  Doubt pneumothorax or sepsis.  CLINICAL IMPRESSION / ASSESSMENT AND PLAN / ED COURSE  Medications ordered in the ED: Medications  nitroGLYCERIN (NITROSTAT) SL tablet 0.4 mg (0.4 mg Sublingual Given 04/12/19 1059)  furosemide (LASIX) 100 mg in dextrose 5 % 50 mL IVPB (has no administration in time range)  furosemide (LASIX) injection 40 mg (40 mg Intravenous Given 04/12/19 0630)    Pertinent labs & imaging results that were available during my care of the patient were reviewed by me and considered in my medical decision making (see chart for details).  Devin Harmon was evaluated in Emergency Department on 04/12/2019 for the symptoms described in the history of present illness. He was evaluated in the context of the global COVID-19 pandemic, which necessitated consideration that the patient might be at risk for infection with the SARS-CoV-2 virus that causes COVID-19. Institutional protocols and algorithms that pertain to the evaluation of patients at risk for COVID-19 are in a state of rapid change based on information released by regulatory bodies including the CDC and federal and state organizations. These policies and algorithms were followed during the patient's care in the ED.   Patient  presents with shortness of breath and tachypnea.  Hypoxic respiratory failure to 86% on room air.  Improved on 2 L nasal cannula but still tachypneic and symptomatic.  Exam consistent with pulmonary edema and volume overload, will obtain chest x-ray and labs.  I will order Lasix IV for now to attempt diuresis.  Clinical Course as of Apr 11 1134  Tue Apr 12, 2019  0948 Chest x-ray reveals pulmonary edema.  With hypoxia and tachypnea, patient will need to be admitted for further diuresis and oxygen therapy.  Not requiring BiPAP or intubation at this time.   [PS]  1601 Discussed with  Dr. Zollie Scale who agrees with high-dose IV Lasix to attempt diuresis to help temporize the patient.  Would anticipate temporary dialysis catheter until patient can be stabilized after which he could have a tunneled catheter placed.   [PS]    Clinical Course User Index [PS] Carrie Mew, MD     ----------------------------------------- 11:12 AM on 04/12/2019 -----------------------------------------  Patient is still severely symptomatic, respiratory rate of nearly 50.  Creatinine is 14 and on further history the patient reports that he has been told he needs dialysis but has been holding off.  Unlikely to be successful diuresing the patient.  Are giving him nitroglycerin tablets to attempt to offload his lungs through venodilation.  BiPAP initiated.  I will give anxiolytics as needed to facilitate BiPAP, but he is at high risk for decompensation requiring intubation..  Primary care notes reviewed, show that patient had previously refused dialysis but is now reconsidering and amenable to it.  When I discuss the need for dialysis with the patient he is agreeable currently.  ____________________________________________   FINAL CLINICAL IMPRESSION(S) / ED DIAGNOSES    Final diagnoses:  End stage renal disease ([place])  Acute respiratory failure with hypoxia ([place])  Acute pulmonary edema [facility])     ED Discharge  Orders    None      Portions of this note were generated with dragon dictation software. Dictation errors may occur despite best attempts at proofreading.   Carrie Mew, MD 04/12/19 [place]    Carrie Mew, MD 04/12/19 1135

## 2019-04-12 NOTE — ED Notes (Signed)
Attempted to call report unsuccessful- was told they would call me back

## 2019-04-12 NOTE — ED Notes (Signed)
Called pharmacy to send Lasix drip

## 2019-04-12 NOTE — Consult Note (Signed)
Name: Devin Harmon MRN: [id number] DOB: 1956/10/03     CONSULTATION DATE: 04/12/2019  REFERRING MD :  Posey Pronto  CHIEF COMPLAINT:  resp failure    HISTORY OF PRESENT ILLNESS:   63 yo AAM previous history of ESRD  Patient had urgent HD previously and then refused and was being evaluated for hospice  Patient seen in ER today for increased WOB and flash PULM edema with acute on chronic renal failure  Patient placed on biPAP and failed Patient was emergently intubated and sedated  VASC CATH PLACED emergently  PAST MEDICAL HISTORY :   has a past medical history of Allergy, BPH (benign prostatic hypertrophy), Hypertension, Renal insufficiency, and Zoster (2007).  has no past surgical history on file. Prior to Admission medications   Medication Sig Start Date End Date Taking? Authorizing Provider  acetaminophen (TYLENOL) 500 MG tablet Take 500-1,000 mg by mouth every 6 (six) hours as needed for mild pain.   Yes [provider]  amLODipine (NORVASC) 10 MG tablet Take 1 tablet (10 mg total) by mouth daily. 10/13/18  Yes Venia Carbon, MD  cetirizine (ZYRTEC) 10 MG tablet Take 10 mg by mouth every evening.    Yes [provider]  fluticasone (FLONASE) 50 MCG/ACT nasal spray Place 1-2 sprays into both nostrils daily.   Yes [provider]  labetalol (NORMODYNE) 100 MG tablet Take 1 tablet (100 mg total) by mouth 2 (two) times daily. 10/13/18  Yes Venia Carbon, MD  minoxidil (LONITEN) 2.5 MG tablet Take 2 tablets (5 mg total) by mouth daily. 03/31/19  Yes Bettey Costa, MD  Multiple Vitamin (MULTIVITAMIN WITH MINERALS) TABS tablet Take 1 tablet by mouth daily. 09/02/18  Yes Samuella Cota, MD  sodium bicarbonate 650 MG tablet Take 1 tablet (650 mg total) by mouth 3 (three) times daily. 10/08/18  Yes Venia Carbon, MD   Allergies  Allergen Reactions  . Chlorthalidone     REACTION: felt "bad" and ED  . Hydrocodone Other (See Comments)    Nausea and  dizzy  . Sulfonamide Derivatives Hives    REACTION: unspecified    FAMILY HISTORY:  family history includes Cancer in his mother; Heart disease in his father; Hypertension in his brother and mother. SOCIAL HISTORY:  reports that he has never smoked. He has never used smokeless tobacco. He reports current alcohol use. He reports previous drug use.  REVIEW OF SYSTEMS:   Unable to obtain due to critical illness   VITAL SIGNS: Temp:  [96.8 F (36 C)-97.9 F (36.6 C)] 96.8 F (36 C) (05/12 1652) Pulse Rate:  [61-81] 72 (05/12 1700) Resp:  [10-44] 16 (05/12 1700) BP: (162-185)/(79-151) 171/92 (05/12 1652) SpO2:  [86 %-100 %] 94 % (05/12 1700) FiO2 (%):  [40 %] 40 % (05/12 1700) Weight:  [75.8 kg] 75.8 kg (05/12 0916)  Physical Examination:  GENERAL:critically ill appearing, +resp distress HEAD: Normocephalic, atraumatic.  EYES: Pupils equal, round, reactive to light.  No scleral icterus.  MOUTH: Moist mucosal membrane. NECK: Supple. No JVD.  PULMONARY: +rhonchi, +wheezing CARDIOVASCULAR: S1 and S2. Regular rate and rhythm. No murmurs, rubs, or gallops.  GASTROINTESTINAL: Soft, nontender, -distended. No masses. Positive bowel sounds. No hepatosplenomegaly.  MUSCULOSKELETAL: No swelling, clubbing, or edema.  NEUROLOGIC: obtunded SKIN:intact,warm,dry     CULTURE RESULTS   Recent Results (from the past 240 hour(s))  SARS Coronavirus 2 (CEPHEID- Performed in [place] hospital lab), Hosp Order     Status: None   Collection Time:  04/12/19  9:26 AM  Result Value Ref Range Status   SARS Coronavirus 2 NEGATIVE NEGATIVE Final    Comment: (NOTE) If result is NEGATIVE SARS-CoV-2 target nucleic acids are NOT DETECTED. The SARS-CoV-2 RNA is generally detectable in upper and lower  respiratory specimens during the acute phase of infection. The lowest  concentration of SARS-CoV-2 viral copies this assay can detect is 250  copies / mL. A negative result does not preclude SARS-CoV-2  infection  and should not be used as the sole basis for treatment or other  patient management decisions.  A negative result may occur with  improper specimen collection / handling, submission of specimen other  than nasopharyngeal swab, presence of viral mutation(s) within the  areas targeted by this assay, and inadequate number of viral copies  (<250 copies / mL). A negative result must be combined with clinical  observations, patient history, and epidemiological information. If result is POSITIVE SARS-CoV-2 target nucleic acids are DETECTED. The SARS-CoV-2 RNA is generally detectable in upper and lower  respiratory specimens dur ing the acute phase of infection.  Positive  results are indicative of active infection with SARS-CoV-2.  Clinical  correlation with patient history and other diagnostic information is  necessary to determine patient infection status.  Positive results do  not rule out bacterial infection or co-infection with other viruses. If result is PRESUMPTIVE POSTIVE SARS-CoV-2 nucleic acids MAY BE PRESENT.   A presumptive positive result was obtained on the submitted specimen  and confirmed on repeat testing.  While 2019 novel coronavirus  (SARS-CoV-2) nucleic acids may be present in the submitted sample  additional confirmatory testing may be necessary for epidemiological  and / or clinical management purposes  to differentiate between  SARS-CoV-2 and other Sarbecovirus currently known to infect humans.  If clinically indicated additional testing with an alternate test  methodology [phone]) is advised. The SARS-CoV-2 RNA is generally  detectable in upper and lower respiratory sp ecimens during the acute  phase of infection. The expected result is Negative. Fact Sheet for Patients:  StrictlyIdeas.no Fact Sheet for Healthcare Providers: BankingDealers.co.za This test is not yet approved or cleared by the [place]  FDA and has been authorized for detection and/or diagnosis of SARS-CoV-2 by FDA under an Emergency Use Authorization (EUA).  This EUA will remain in effect (meaning this test can be used) for the duration of the COVID-19 declaration under Section 564(b)(1) of the Act, 21 U.S.C. section 360bbb-3(b)(1), unless the authorization is terminated or revoked sooner. Performed at [facility], [address] [place], [place] [address]          COVID-19 NEGATIVE: Acute COVID-19 infection ruled out by PCR.   CBC    Component Value Date/Time   WBC 7.8 04/12/2019 0926   RBC 2.87 (L) 04/12/2019 0926   HGB 7.8 (L) 04/12/2019 0926   HCT 24.4 (L) 04/12/2019 0926   PLT 287 04/12/2019 0926   MCV 85.0 04/12/2019 0926   MCH 27.2 04/12/2019 0926   MCHC 32.0 04/12/2019 0926   RDW 15.5 04/12/2019 0926   LYMPHSABS 0.8 04/12/2019 0926   MONOABS 0.5 04/12/2019 0926   EOSABS 0.2 04/12/2019 0926   BASOSABS 0.1 04/12/2019 0926   BMP Latest Ref Rng & Units 04/12/2019 03/29/2019 03/28/2019  Glucose 70 - 99 mg/dL 113(H) 108(H) 115(H)  BUN 8 - 23 mg/dL 115(H) 87(H) 115(H)  Creatinine 0.61 - 1.24 mg/dL 14.93(H) 11.48(H) 15.20(H)  Sodium 135 - 145 mmol/L 138 142 138  Potassium 3.5 -  5.1 mmol/L 5.2(H) 4.2 4.5  Chloride 98 - 111 mmol/L 105 105 102  CO2 22 - 32 mmol/L 15(L) 23 20(L)  Calcium 8.9 - 10.3 mg/dL 7.4(L) 7.3(L) 7.1(L)      IMAGING    Dg Chest Portable 1 View  Result Date: 04/12/2019 CLINICAL DATA:  Shortness of breath and swelling of the lower extremities, worsening recently. EXAM: PORTABLE CHEST 1 VIEW COMPARISON:  03/27/2019 FINDINGS: Central line has been removed. Mild cardiomegaly and aortic atherosclerosis as seen previously. Widespread pulmonary opacity, left more than right, could represent fluid overload/edema and/or pneumonia. There may be a small amount of pleural fluid on the left. IMPRESSION: Nonspecific widespread bilateral pulmonary density that could represent edema  and/or pneumonia. Electronically Signed   By: Nelson Chimes M.D.   On: 04/12/2019 09:56        Indwelling Urinary Catheter continued, requirement due to   Reason to continue Indwelling Urinary Catheter for strict Intake/Output monitoring for hemodynamic instability   Central Line continued, requirement due to   Reason to continue [employer] Monitoring of central venous pressure or other hemodynamic parameters   Ventilator continued, requirement due to, resp failure    Ventilator Sedation RASS 0 to -2     ASSESSMENT AND PLAN SYNOPSIS   Severe ACUTE Hypoxic and Hypercapnic Respiratory Failure from  acute pulm edema from acute on chronic renal failure -continue Full MV support -continue Bronchodilator Therapy -Wean Fio2 and PEEP as tolerated -will perform SAT/SBT when respiratory parameters are met   ACUTE KIDNEY INJURY on top of Chronic Renal Failure from HTN  -follow chem 7 -follow UO -continue Foley Catheter-assess need Plan for HD Follow up Nephrology recs   NEUROLOGY - intubated and sedated - minimal sedation to achieve a RASS goal: -1    CARDIAC ICU monitoring Check ECHO    GI GI PROPHYLAXIS as indicated  NUTRITIONAL STATUS DIET-->NPO Constipation protocol as indicated   ENDO - ICU hypoglycemic\Hyperglycemia protocol as needed -check FSBS per protocol   ELECTROLYTES -follow labs as needed -replace as needed -pharmacy consultation and following   DVT/GI PRX ordered TRANSFUSIONS AS NEEDED MONITOR FSBS ASSESS the need for LABS as needed   Critical Care Time devoted to patient care services described in this note is 45 minutes.   Overall, patient is critically ill, prognosis is guarded.  Patient with Multiorgan failure and at high risk for cardiac arrest and death.    Corrin Parker, M.D.  [facility]  Medical Director [place] Director [facility] Cardio-Pulmonary Department

## 2019-04-12 NOTE — H&P (Signed)
[place] [facility] [place] NAME: Devin Harmon    MR#:  [id number]  DATE OF BIRTH:  12/26/1955  DATE OF ADMISSION:  04/12/2019  PRIMARY CARE PHYSICIAN: Venia Carbon, MD   REQUESTING/REFERRING PHYSICIAN: Dr Joni Fears  CHIEF COMPLAINT:   Patient came in with increasing shortness of breath early. Currently on BiPAP. HISTORY OF PRESENT ILLNESS:  Devin Harmon  is a 63 y.o. male with a known history of end-stage renal disease was started on dialysis in April with similar symptoms of acute on chronic shortness of breath. Patient refused dialysis at prior to discharge was placed on hospice and discharged to home. He became short of breath again now presents with worsening short of breath and lower extremity edema. His blood pressure is elevated. In the ER patient was placed on BiPAP. Chest x-ray shows bilateral pulmonary edema/volume overload. His creatinine is 14.9. BUN is 115. Potassium 5.2. Patient was seen in the emergency room by Dr. Candiss Norse. He is now agreeable for hemodialysis. Temporary catheter will be placed by vascular surgery. Patient is being admitted with acute hypoxic respiratory failure secondary to pulmonary edema in the setting of end-stage renal disease.  PAST MEDICAL HISTORY:   Past Medical History:  Diagnosis Date  . Allergy   . BPH (benign prostatic hypertrophy)   . Hypertension   . Renal insufficiency   . Zoster 2007   facial, hospital 2007    PAST SURGICAL HISTOIRY:  History reviewed. No pertinent surgical history.  SOCIAL HISTORY:   Social History   Tobacco Use  . Smoking status: Never Smoker  . Smokeless tobacco: Never Used  Substance Use Topics  . Alcohol use: Yes    Comment: occasional    FAMILY HISTORY:   Family History  Problem Relation Age of Onset  . Hypertension Mother   . Cancer Mother        lung cancer  . Hypertension Brother   . Heart disease Father   . Coronary artery disease Neg Hx   .  Diabetes Neg Hx     DRUG ALLERGIES:   Allergies  Allergen Reactions  . Chlorthalidone     REACTION: felt "bad" and ED  . Hydrocodone Other (See Comments)    Nausea and dizzy  . Sulfonamide Derivatives Hives    REACTION: unspecified    REVIEW OF SYSTEMS:  Review of Systems  Constitutional: Negative for chills, fever and weight loss.  HENT: Negative for ear discharge, ear pain and nosebleeds.   Eyes: Negative for blurred vision, pain and discharge.  Respiratory: Positive for shortness of breath. Negative for sputum production, wheezing and stridor.   Cardiovascular: Positive for leg swelling and PND. Negative for chest pain, palpitations and orthopnea.  Gastrointestinal: Negative for abdominal pain, diarrhea, nausea and vomiting.  Genitourinary: Negative for frequency and urgency.  Musculoskeletal: Negative for back pain and joint pain.  Neurological: Negative for sensory change, speech change, focal weakness and weakness.  Psychiatric/Behavioral: Negative for depression and hallucinations. The patient is not nervous/anxious.      MEDICATIONS AT HOME:   Prior to Admission medications   Medication Sig Start Date End Date Taking? Authorizing Provider  acetaminophen (TYLENOL) 500 MG tablet Take 500-1,000 mg by mouth every 6 (six) hours as needed for mild pain.   Yes [provider]  amLODipine (NORVASC) 10 MG tablet Take 1 tablet (10 mg total) by mouth daily. 10/13/18  Yes Venia Carbon, MD  cetirizine (ZYRTEC) 10 MG tablet Take  10 mg by mouth every evening.    Yes [provider]  fluticasone (FLONASE) 50 MCG/ACT nasal spray Place 1-2 sprays into both nostrils daily.   Yes [provider]  labetalol (NORMODYNE) 100 MG tablet Take 1 tablet (100 mg total) by mouth 2 (two) times daily. 10/13/18  Yes Venia Carbon, MD  minoxidil (LONITEN) 2.5 MG tablet Take 2 tablets (5 mg total) by mouth daily. 03/31/19  Yes Bettey Costa, MD  Multiple Vitamin  (MULTIVITAMIN WITH MINERALS) TABS tablet Take 1 tablet by mouth daily. 09/02/18  Yes Samuella Cota, MD  sodium bicarbonate 650 MG tablet Take 1 tablet (650 mg total) by mouth 3 (three) times daily. 10/08/18  Yes Venia Carbon, MD      VITAL SIGNS:  Blood pressure (!) 178/92, pulse 75, temperature 97.9 F (36.6 C), temperature source Oral, resp. rate (!) 41, height 5\' 8"  (1.727 m), weight 75.8 kg, SpO2 98 %.  PHYSICAL EXAMINATION:  GENERAL:  63 y.o.-year-old patient lying in the bed with mild to moderate acute distress.  EYES: Pupils equal, round, reactive to light and accommodation. No scleral icterus. Extraocular muscles intact.  HEENT: Head atraumatic, normocephalic. Oropharynx and nasopharynx clear. On BiPAP NECK:  Supple, no jugular venous distention. No thyroid enlargement, no tenderness.  LUNGS: decreased breath sounds bilaterally, no wheezing, few rales, no rhonchi or crepitation. Mild use of accessory muscles of respiration.  CARDIOVASCULAR: S1, S2 normal. No murmurs, rubs, or gallops. [place] ABDOMEN: Soft, nontender, nondistended. Bowel sounds present. No organomegaly or mass.  EXTREMITIES: ++pedal edema -no cyanosis, or clubbing.  NEUROLOGIC: Cranial nerves II through XII are intact. Muscle strength 5/5 in all extremities. Sensation intact. Gait not checked.  PSYCHIATRIC: The patient is alert and oriented x 3.  SKIN: No obvious rash, lesion, or ulcer.   LABORATORY PANEL:   CBC Recent Labs  Lab 04/12/19 0926  WBC 7.8  HGB 7.8*  HCT 24.4*  PLT 287   ------------------------------------------------------------------------------------------------------------------  Chemistries  Recent Labs  Lab 04/12/19 0926  NA 138  K 5.2*  CL 105  CO2 15*  GLUCOSE 113*  BUN 115*  CREATININE 14.93*  CALCIUM 7.4*  AST 16  ALT 17  ALKPHOS 47  BILITOT 0.5    ------------------------------------------------------------------------------------------------------------------  Cardiac Enzymes Recent Labs  Lab 04/12/19 0926  TROPONINI <0.03   ------------------------------------------------------------------------------------------------------------------  RADIOLOGY:  Dg Chest Portable 1 View  Result Date: 04/12/2019 CLINICAL DATA:  Shortness of breath and swelling of the lower extremities, worsening recently. EXAM: PORTABLE CHEST 1 VIEW COMPARISON:  03/27/2019 FINDINGS: Central line has been removed. Mild cardiomegaly and aortic atherosclerosis as seen previously. Widespread pulmonary opacity, left more than right, could represent fluid overload/edema and/or pneumonia. There may be a small amount of pleural fluid on the left. IMPRESSION: Nonspecific widespread bilateral pulmonary density that could represent edema and/or pneumonia. Electronically Signed   By: Nelson Chimes M.D.   On: 04/12/2019 09:56    EKG:    IMPRESSION AND PLAN:   Devin Harmon  is a 63 y.o. male with a known history of end-stage renal disease was started on dialysis in April with similar symptoms of acute on chronic shortness of breath. Patient refused dialysis at prior to discharge was placed on hospice and discharged to home. He became short of breath again now presents with worsening short of breath and lower extremity edema.   1. Acute hypoxic respiratory failure secondary to severe pulmonary edema in the setting of end-stage renal disease -patient had declined dialysis  when he was discharged home with hospice in April 2020. -Patient was seen by Dr. Candiss Norse. He is now agreeable for dialysis. Temp catheter will be placed by vascular -patient currently on BiPAP. -Dr. Candiss Norse has informed Dr. Jamal Collin of patient being admitted -wean off BiPAP as tolerated  2. End-stage renal disease on hemodialysis -patient now agreeable for dialysis. -Education officer, museum for outpatient dialysis  chair time -patient will need PPD and hepatitis panel-- to be done by Dr. Candiss Norse  3. Malignant hypertension -continue home meds. PRN IV hydralazine  4. Anemia of chronic disease in the setting of end-stage renal disease -per Dr. Candiss Norse  5. DVT prophylaxis subcu heparin    All the records are reviewed and case discussed with ED provider.   CODE STATUS: full  TOTAL TIME TAKING CARE OF THIS PATIENT: *55* minutes.    Fritzi Mandes M.D on 04/12/2019 at 12:14 PM  Between 7am to 6pm - Pager - [phone]  After 6pm go to [email or web address] - password [email or web address] [facility]  [facility]  Office  [phone]  CC: Primary care physician; Venia Carbon, MD

## 2019-04-12 NOTE — Progress Notes (Signed)
Post HD Assessment    04/12/19 2015  Neurological  Level of Consciousness Alert  Orientation Level Intubated/Tracheostomy - Unable to assess  Respiratory  Respiratory Pattern Regular;Unlabored  Chest Assessment Chest expansion symmetrical  Bilateral Breath Sounds Diminished  Cough Congested  Cardiac  Pulse Regular  Heart Sounds S1, S2  ECG Monitor Yes  Cardiac Rhythm NSR  Vascular  R Radial Pulse +2  L Radial Pulse +2  Edema Generalized  Generalized Edema +2  Psychosocial  Psychosocial (WDL) X  Patient Behaviors Not interactive

## 2019-04-12 NOTE — ED Notes (Signed)
Respiratory called for transport.

## 2019-04-12 NOTE — Progress Notes (Signed)
HD Tx completed    04/12/19 2000  Vital Signs  Pulse Rate 71  Pulse Rate Source Monitor  Resp 18  BP (!) 179/88  BP Location Left Arm  BP Method Automatic  Patient Position (if appropriate) Lying  Oxygen Therapy  SpO2 100 %  O2 Device Ventilator  End [place] CO2 (EtCO2) 44  During Hemodialysis Assessment  HD Safety Checks Performed Yes  KECN 25.1 KECN  Dialysis Fluid Bolus Normal Saline  Bolus Amount (mL) 250 mL  Intra-Hemodialysis Comments Tx completed;Tolerated well

## 2019-04-12 NOTE — Progress Notes (Signed)
[facility] [place] [facility], [place] 04/12/19  Subjective:   Patient is a 63 year old African-American gentleman who is known to our practice from previous admission.  He presented in mid April for acute shortness of breath and chronic renal failure.  He was deemed end-stage renal disease and started on dialysis.  However, patient discontinued dialysis prior to discharge and was placed on hospice.  Now at home, he became short of breath again and presents for worsening shortness of breath and lower extremity edema  Today in the emergency room patient was placed on positive airway pressure ventilation.  His chest x-ray showed widespread bilateral pulmonary edema Lab results show a creatinine of 4.9, BUN 115 and potassium of 5.2 Patient is acidotic with bicarbonate of 15 He has moderate to severe anemia with hemoglobin of 7.8   Objective:  Vital signs in last 24 hours:  Temp:  [97.9 F (36.6 C)] 97.9 F (36.6 C) (05/12 0919) Pulse Rate:  [66-79] 75 (05/12 1109) Resp:  [26-44] 41 (05/12 1100) BP: (164-185)/(79-151) 178/92 (05/12 1100) SpO2:  [86 %-98 %] 98 % (05/12 1109) Weight:  [75.8 kg] 75.8 kg (05/12 0916)  Weight change:  Filed Weights   04/12/19 0916  Weight: 75.8 kg    Intake/Output:   No intake or output data in the 24 hours ending 04/12/19 1137   Physical Exam: General:  Critically ill-appearing, laying in the bed  HEENT PAP mask in place  Neck  distended neck veins  Pulm/lungs  bilateral diffuse crackles  CVS/Heart  tachycardic  Abdomen:   Soft, mildly distended  Extremities:  2-3+ pitting edema  Neurologic:  Alert, able to answer questions appropriately  Skin:  No acute distress  Access:  To be placed       Basic Metabolic Panel:  Recent Labs  Lab 04/12/19 0926  NA 138  K 5.2*  CL 105  CO2 15*  GLUCOSE 113*  BUN 115*  CREATININE 14.93*  CALCIUM 7.4*     CBC: Recent Labs  Lab 04/12/19 0926  WBC 7.8  NEUTROABS 6.2  HGB 7.8*  HCT  24.4*  MCV 85.0  PLT 287      Lab Results  Component Value Date   HEPBSAG Negative 03/27/2019   HEPBSAB Non Reactive 03/27/2019      Microbiology:  Recent Results (from the past 240 hour(s))  SARS Coronavirus 2 (CEPHEID- Performed in [facility] hospital lab), Hosp Order     Status: None   Collection Time: 04/12/19  9:26 AM  Result Value Ref Range Status   SARS Coronavirus 2 NEGATIVE NEGATIVE Final    Comment: (NOTE) If result is NEGATIVE SARS-CoV-2 target nucleic acids are NOT DETECTED. The SARS-CoV-2 RNA is generally detectable in upper and lower  respiratory specimens during the acute phase of infection. The lowest  concentration of SARS-CoV-2 viral copies this assay can detect is 250  copies / mL. A negative result does not preclude SARS-CoV-2 infection  and should not be used as the sole basis for treatment or other  patient management decisions.  A negative result may occur with  improper specimen collection / handling, submission of specimen other  than nasopharyngeal swab, presence of viral mutation(s) within the  areas targeted by this assay, and inadequate number of viral copies  (<250 copies / mL). A negative result must be combined with clinical  observations, patient history, and epidemiological information. If result is POSITIVE SARS-CoV-2 target nucleic acids are DETECTED. The SARS-CoV-2 RNA is generally detectable in upper  and lower  respiratory specimens dur ing the acute phase of infection.  Positive  results are indicative of active infection with SARS-CoV-2.  Clinical  correlation with patient history and other diagnostic information is  necessary to determine patient infection status.  Positive results do  not rule out bacterial infection or co-infection with other viruses. If result is PRESUMPTIVE POSTIVE SARS-CoV-2 nucleic acids MAY BE PRESENT.   A presumptive positive result was obtained on the submitted specimen  and confirmed on repeat testing.   While 2019 novel coronavirus  (SARS-CoV-2) nucleic acids may be present in the submitted sample  additional confirmatory testing may be necessary for epidemiological  and / or clinical management purposes  to differentiate between  SARS-CoV-2 and other Sarbecovirus currently known to infect humans.  If clinically indicated additional testing with an alternate test  methodology [phone]) is advised. The SARS-CoV-2 RNA is generally  detectable in upper and lower respiratory sp ecimens during the acute  phase of infection. The expected result is Negative. Fact Sheet for Patients:  StrictlyIdeas.no Fact Sheet for Healthcare Providers: BankingDealers.co.za This test is not yet approved or cleared by the [place] FDA and has been authorized for detection and/or diagnosis of SARS-CoV-2 by FDA under an Emergency Use Authorization (EUA).  This EUA will remain in effect (meaning this test can be used) for the duration of the COVID-19 declaration under Section 564(b)(1) of the Act, 21 U.S.C. section 360bbb-3(b)(1), unless the authorization is terminated or revoked sooner. Performed at [facility], [address] [place], [place] [address]     Coagulation Studies: No results for input(s): LABPROT, INR in the last 72 hours.  Urinalysis: No results for input(s): COLORURINE, LABSPEC, PHURINE, GLUCOSEU, HGBUR, BILIRUBINUR, KETONESUR, PROTEINUR, UROBILINOGEN, NITRITE, LEUKOCYTESUR in the last 72 hours.  Invalid input(s): APPERANCEUR    Imaging: Dg Chest Portable 1 View  Result Date: 04/12/2019 CLINICAL DATA:  Shortness of breath and swelling of the lower extremities, worsening recently. EXAM: PORTABLE CHEST 1 VIEW COMPARISON:  03/27/2019 FINDINGS: Central line has been removed. Mild cardiomegaly and aortic atherosclerosis as seen previously. Widespread pulmonary opacity, left more than right, could represent fluid overload/edema  and/or pneumonia. There may be a small amount of pleural fluid on the left. IMPRESSION: Nonspecific widespread bilateral pulmonary density that could represent edema and/or pneumonia. Electronically Signed   By: Nelson Chimes M.D.   On: 04/12/2019 09:56     Medications:   . furosemide      nitroGLYCERIN  Assessment/ Plan:  63 y.o. African-American male with hypertension, BPH, advanced CKD, was admitted on 03/27/2019 with uremia and acute pulmonary edema.   1.  End-stage renal disease 2.  Acute pulmonary edema 3.  Severe hypertension with CKD 4.  Lower extremity edema 5.  Severe acidosis. 6.  Anemia, likely of chronic kidney disease 7.  2D echo from September 2019 show moderate concentric left ventricular hypertrophy, LVEF 55 to 81%, grade 1 diastolic dysfunction, severely dilated left atrium  Patient had a similar presentation of shortness of breath and volume overload in mid April.  At that time he was started on dialysis but patient refused to continue chronic dialysis therefore he was placed on hospice. Last week, patient has been talking with his primary care as well as Dr. Holley Raring and he is now willing to try dialysis.  Consent obtained from patient, nurse Helene Kelp witnessed it. Plan is to admit patient to stepdown, obtain temporary dialysis catheter for dialysis today.  When patient is hemodynamically and respiratory  status is stable, we will obtain PermCath and start working on outpatient dialysis placement. We will check iron studies for iron deficiency Dialysis will assist in correcting potassium as well as acidosis Volume removal with dialysis today as tolerated.  May need daily dialysis for the next several days   LOS: 0 Makinzie Considine Candiss Norse 5/12/202011:37 AM  [place], [place]  Note: This note was prepared with Dragon dictation. Any transcription errors are unintentional

## 2019-04-12 NOTE — ED Notes (Signed)
Report given to Taylor RN.

## 2019-04-12 NOTE — Progress Notes (Signed)
Pre HD Assessment    04/12/19 1755  Neurological  Level of Consciousness Responds to Pain  Orientation Level Intubated/Tracheostomy - Unable to assess  Respiratory  Respiratory Pattern Regular;Labored  Chest Assessment Chest expansion symmetrical  Bilateral Breath Sounds Rhonchi;Diminished  Cough None  Cardiac  Pulse Regular  Heart Sounds S1, S2  ECG Monitor Yes  Cardiac Rhythm NSR  Vascular  R Radial Pulse +2  L Radial Pulse +2  Edema Generalized  Generalized Edema +2  Psychosocial  Psychosocial (WDL) X  Patient Behaviors Not interactive

## 2019-04-12 NOTE — ED Notes (Signed)
RT to bedside

## 2019-04-12 NOTE — ED Notes (Signed)
Attempted to call report will call back. 

## 2019-04-12 NOTE — Progress Notes (Signed)
Post HD Tx

## 2019-04-12 NOTE — ED Triage Notes (Signed)
Pt comes into the ED with wife, c/o SOB with swelling in BL LE for the past week, worse in the past 24hrs. Pt is very tachypnea on arrival and taken back to room 11.

## 2019-04-12 NOTE — ED Notes (Signed)
Attempted to call report and per ICU they were not able to take pt at this time

## 2019-04-12 NOTE — Progress Notes (Signed)
HD Tx started    04/12/19 1800  Vital Signs  Pulse Rate 71  Resp 17  BP (!) 169/76  Oxygen Therapy  SpO2 100 %  End Tidal CO2 (EtCO2) 38  During Hemodialysis Assessment  Blood Flow Rate (mL/min) 200 mL/min  Arterial Pressure (mmHg) -120 mmHg  Venous Pressure (mmHg) 200 mmHg  Transmembrane Pressure (mmHg) 40 mmHg  Ultrafiltration Rate (mL/min) 1040 mL/min  Dialysate Flow Rate (mL/min) 300 ml/min  Conductivity: Machine  13.5  HD Safety Checks Performed Yes  Dialysis Fluid Bolus Normal Saline  Bolus Amount (mL) 250 mL  Intra-Hemodialysis Comments Tx initiated

## 2019-04-12 NOTE — ED Notes (Signed)
Pt gave verbal permission for this nurse to call his wife and give an update

## 2019-04-12 NOTE — ED Notes (Signed)
ED TO INPATIENT HANDOFF REPORT  ED Nurse Name and Phone #: Seri Kimmer [phone]  S Name/Age/Gender Devin Harmon 63 y.o. male Room/Bed: ED11A/ED11A  Code Status   Code Status: Prior  Home/SNF/Other Home Patient oriented to: self, place, time and situation Is this baseline? Yes   Triage Complete: Triage complete  Chief Complaint sob;swollen feet  Triage Note Pt comes into the ED with wife, c/o SOB with swelling in BL LE for the past week, worse in the past 24hrs. Pt is very tachypnea on arrival and taken back to room 11.   Allergies Allergies  Allergen Reactions  . Chlorthalidone     REACTION: felt "bad" and ED  . Hydrocodone Other (See Comments)    Nausea and dizzy  . Sulfonamide Derivatives Hives    REACTION: unspecified    Level of Care/Admitting Diagnosis ED Disposition    ED Disposition Condition [place] [facility]: [place] [[id number]]  Level of Care: Stepdown [14]  Covid Evaluation: N/A  Diagnosis: Acute pulmonary edema ([place]) [833825]  Admitting Physician: Odessa Fleming  Attending Physician: Odessa Fleming  Estimated length of stay: past midnight tomorrow  Certification:: I certify this patient will need inpatient services for at least 2 midnights  PT Class (Do Not Modify): Inpatient [101]  PT Acc Code (Do Not Modify): Private [1]       B Medical/Surgery History Past Medical History:  Diagnosis Date  . Allergy   . BPH (benign prostatic hypertrophy)   . Hypertension   . Renal insufficiency   . Zoster 2007   facial, hospital 2007   History reviewed. No pertinent surgical history.   A IV Location/Drains/Wounds Patient Lines/Drains/Airways Status   Active Line/Drains/Airways    Name:   Placement date:   Placement time:   Site:   Days:   Peripheral IV 04/12/19 Right Antecubital   04/12/19    0924    Antecubital   less than 1          Intake/Output Last 24 hours No intake or output data in the 24 hours  ending 04/12/19 1223  Labs/Imaging Results for orders placed or performed during the hospital encounter of 04/12/19 (from the past 48 hour(s))  Comprehensive metabolic panel     Status: Abnormal   Collection Time: 04/12/19  9:26 AM  Result Value Ref Range   Sodium 138 135 - 145 mmol/L   Potassium 5.2 (H) 3.5 - 5.1 mmol/L   Chloride 105 98 - 111 mmol/L   CO2 15 (L) 22 - 32 mmol/L   Glucose, Bld 113 (H) 70 - 99 mg/dL   BUN 115 (H) 8 - 23 mg/dL    Comment: RESULT CONFIRMED BY MANUAL DILUTION/HKP   Creatinine, Ser 14.93 (H) 0.61 - 1.24 mg/dL   Calcium 7.4 (L) 8.9 - 10.3 mg/dL   Total Protein 7.1 6.5 - 8.1 g/dL   Albumin 3.9 3.5 - 5.0 g/dL   AST 16 15 - 41 U/L   ALT 17 0 - 44 U/L   Alkaline Phosphatase 47 38 - 126 U/L   Total Bilirubin 0.5 0.3 - 1.2 mg/dL   GFR calc non Af Amer 3 (L) >60 mL/min   GFR calc Af Amer 4 (L) >60 mL/min   Anion gap 18 (H) 5 - 15    Comment: Performed at [facility], [address] [place], [place] [address]  Troponin I - Once     Status: None   Collection Time:  04/12/19  9:26 AM  Result Value Ref Range   Troponin I <0.03 <0.03 ng/mL    Comment: Performed at [facility], [address] [place], [place] [address]  CBC with Differential     Status: Abnormal   Collection Time: 04/12/19  9:26 AM  Result Value Ref Range   WBC 7.8 4.0 - 10.5 K/uL   RBC 2.87 (L) 4.22 - 5.81 MIL/uL   Hemoglobin 7.8 (L) 13.0 - 17.0 g/dL   HCT 24.4 (L) 39.0 - 52.0 %   MCV 85.0 80.0 - 100.0 fL   MCH 27.2 26.0 - 34.0 pg   MCHC 32.0 30.0 - 36.0 g/dL   RDW 15.5 11.5 - 15.5 %   Platelets 287 150 - 400 K/uL   nRBC 0.0 0.0 - 0.2 %   Neutrophils Relative % 78 %   Neutro Abs 6.2 1.7 - 7.7 K/uL   Lymphocytes Relative 11 %   Lymphs Abs 0.8 0.7 - 4.0 K/uL   Monocytes Relative 6 %   Monocytes Absolute 0.5 0.1 - 1.0 K/uL   Eosinophils Relative 3 %   Eosinophils Absolute 0.2 0.0 - 0.5 K/uL   Basophils Relative 1 %   Basophils Absolute 0.1 0.0 - 0.1 K/uL    Immature Granulocytes 1 %   Abs Immature Granulocytes 0.05 0.00 - 0.07 K/uL    Comment: Performed at [facility], [address], [place], [place] [address]  SARS Coronavirus 2 (CEPHEID- Performed in [place] hospital lab), Hosp Order     Status: None   Collection Time: 04/12/19  9:26 AM  Result Value Ref Range   SARS Coronavirus 2 NEGATIVE NEGATIVE    Comment: (NOTE) If result is NEGATIVE SARS-CoV-2 target nucleic acids are NOT DETECTED. The SARS-CoV-2 RNA is generally detectable in upper and lower  respiratory specimens during the acute phase of infection. The lowest  concentration of SARS-CoV-2 viral copies this assay can detect is 250  copies / mL. A negative result does not preclude SARS-CoV-2 infection  and should not be used as the sole basis for treatment or other  patient management decisions.  A negative result may occur with  improper specimen collection / handling, submission of specimen other  than nasopharyngeal swab, presence of viral mutation(s) within the  areas targeted by this assay, and inadequate number of viral copies  (<250 copies / mL). A negative result must be combined with clinical  observations, patient history, and epidemiological information. If result is POSITIVE SARS-CoV-2 target nucleic acids are DETECTED. The SARS-CoV-2 RNA is generally detectable in upper and lower  respiratory specimens dur ing the acute phase of infection.  Positive  results are indicative of active infection with SARS-CoV-2.  Clinical  correlation with patient history and other diagnostic information is  necessary to determine patient infection status.  Positive results do  not rule out bacterial infection or co-infection with other viruses. If result is PRESUMPTIVE POSTIVE SARS-CoV-2 nucleic acids MAY BE PRESENT.   A presumptive positive result was obtained on the submitted specimen  and confirmed on repeat testing.  While 2019 novel coronavirus  (SARS-CoV-2)  nucleic acids may be present in the submitted sample  additional confirmatory testing may be necessary for epidemiological  and / or clinical management purposes  to differentiate between  SARS-CoV-2 and other Sarbecovirus currently known to infect humans.  If clinically indicated additional testing with an alternate test  methodology [phone]) is advised. The SARS-CoV-2 RNA is generally  detectable in upper and  lower respiratory sp ecimens during the acute  phase of infection. The expected result is Negative. Fact Sheet for Patients:  StrictlyIdeas.no Fact Sheet for Healthcare Providers: BankingDealers.co.za This test is not yet approved or cleared by the [place] FDA and has been authorized for detection and/or diagnosis of SARS-CoV-2 by FDA under an Emergency Use Authorization (EUA).  This EUA will remain in effect (meaning this test can be used) for the duration of the COVID-19 declaration under Section 564(b)(1) of the Act, 21 U.S.C. section 360bbb-3(b)(1), unless the authorization is terminated or revoked sooner. Performed at [facility], [address] [place], [place] [address]    Dg Chest Portable 1 View  Result Date: 04/12/2019 CLINICAL DATA:  Shortness of breath and swelling of the lower extremities, worsening recently. EXAM: PORTABLE CHEST 1 VIEW COMPARISON:  03/27/2019 FINDINGS: Central line has been removed. Mild cardiomegaly and aortic atherosclerosis as seen previously. Widespread pulmonary opacity, left more than right, could represent fluid overload/edema and/or pneumonia. There may be a small amount of pleural fluid on the left. IMPRESSION: Nonspecific widespread bilateral pulmonary density that could represent edema and/or pneumonia. Electronically Signed   By: Nelson Chimes M.D.   On: 04/12/2019 09:56    Pending Labs Unresulted Labs (From admission, onward)    Start     Ordered   04/12/19 1208   Transferrin  Once,   STAT    Comments:  Draw at dialysis    04/12/19 1207   04/12/19 1207  QuantiFERON-TB Gold Plus  Once,   STAT     04/12/19 1207   Signed and Held  Hepatitis B surface antigen  (New Patient Hemo Labs (Hepatitis B))  Once,   R     Signed and Held   Signed and Held  Hepatitis B surface antibody  (New Patient Hemo Labs (Hepatitis B))  Once,   R     Signed and Held   Signed and Held  Hepatitis B core antibody, total  (New Patient Hemo Labs (Hepatitis B))  Once,   R     Signed and Held   Signed and Held  Hepatitis C antibody  (New Patient Hemo Labs (Hepatitis B))  Once,   R     Signed and Held   Signed and Held  Parathyroid hormone, intact (no Ca)  Once,   R     Signed and Held   Signed and Held  Phosphorus  Once,   R     Signed and Held   Signed and Held  Iron and TIBC  Once,   R     Signed and Held   Signed and Held  Ferritin  Once,   R     Signed and Held   Signed and Held  CBC  (heparin)  Once,   R    Comments:  Baseline for heparin therapy IF NOT ALREADY DRAWN.  Notify MD if PLT < 100 K.    Signed and Held   Signed and Held  Creatinine, serum  (heparin)  Once,   R    Comments:  Baseline for heparin therapy IF NOT ALREADY DRAWN.    Signed and Held   Signed and Held  Basic metabolic panel  Tomorrow morning,   R     Signed and Held          Vitals/Pain Today's Vitals   04/12/19 1000 04/12/19 1030 04/12/19 1100 04/12/19 1109  BP: (!) 174/96 (!) 164/79 (!) 178/92   Pulse: 66  73 79 75  Resp: (!) 26 (!) 37 (!) 41   Temp:      TempSrc:      SpO2: 94% 94% 95% 98%  Weight:      Height:      PainSc:        Isolation Precautions Airborne and Contact precautions  Medications Medications  nitroGLYCERIN (NITROSTAT) SL tablet 0.4 mg (0.4 mg Sublingual Given 04/12/19 1059)  furosemide (LASIX) 100 mg in dextrose 5 % 50 mL IVPB (has no administration in time range)  Chlorhexidine Gluconate Cloth 2 % PADS 6 each (has no administration in time range)   furosemide (LASIX) injection 40 mg (40 mg Intravenous Given 04/12/19 0929)    Mobility walks High fall risk   Focused Assessments Cardiac Assessment Handoff:  Cardiac Rhythm: Normal sinus rhythm Lab Results  Component Value Date   TROPONINI <0.03 04/12/2019   No results found for: DDIMER Does the Patient currently have chest pain? No     R Recommendations: See Admitting Provider Note  Report given to:   Additional Notes:

## 2019-04-12 NOTE — ED Notes (Signed)
Wife given an update on pt condition

## 2019-04-12 NOTE — ED Notes (Signed)
Pt called out, states, "I cant breath."  Pt continues to be tachypneic with increased work of breathing.  EDP notified; RT notified to place patient on BiPap.

## 2019-04-12 NOTE — Progress Notes (Signed)
Family Meeting Note  Advance Directive:no  Today a meeting took place with the patient and the emergency room patient is being admitted with acute hypoxic respiratory failure secondary to severe pulmonary edema in the setting of end-stage renal disease. He is now agreeable for dialysis. Patient is on BiPAP. Code status address. Patient wants to be full code. He is gonna get a temp cath placed and get dialysis initiated.  Time spent critical 16 minutes. Palliative care as needed Fritzi Mandes, MD

## 2019-04-12 NOTE — Procedures (Signed)
Central Venous Dailysis Catheter Placement: TRIPLE LUMEN  Indication: Hemo Dialysis/CRRT   Consent:emergent   Hand washing performed prior to starting the procedure.   Procedure: An active timeout was performed and correct patient, name, & ID confirmed. Patient was positioned correctly for central venous access. Patient was prepped using strict sterile technique including chlorohexadine preps, sterile drape, sterile gown and sterile gloves.  The area was prepped, draped and anesthetized in the usual sterile manner. Patient comfort was obtained.    A Double lumen catheter was placed in RT FEMORAL Vein There was good blood return, catheter caps were placed on lumens, catheter flushed easily, the line was secured and a sterile dressing and BIO-PATCH applied.   Ultrasound was used to visualize vasculature and guidance of needle.   Number of Attempts: 1 Complications:none  Estimated Blood Loss: none Operator: Kron Everton.   Corrin Parker, M.D.  [facility]  Medical Director [place] Director [facility] Cardio-Pulmonary Department

## 2019-04-12 NOTE — Progress Notes (Signed)
Pre HD TX   04/12/19 1755  Hand-Off documentation  Report given to (Full Name) Beatris Ship, RN   Report received from (Full Name) Casey Burkitt, RN   Vital Signs  Temp (!) 97 F (36.1 C)  Temp Source Axillary  Pulse Rate 70  Pulse Rate Source Monitor  Resp 18  BP (!) 160/89  BP Location Left Arm  BP Method Automatic  Patient Position (if appropriate) Lying  Oxygen Therapy  SpO2 100 %  O2 Device Ventilator  End Tidal CO2 (EtCO2) 36  Pain Assessment  Pain Scale CPOT  Critical Care Pain Observation Tool (CPOT)  Facial Expression 0  Body Movements 0  Muscle Tension 0  Compliance with ventilator (intubated pts.) 1  Vocalization (extubated pts.) N/A  CPOT Total 1  Dialysis Weight  Weight 75.8 kg  Type of Weight Pre-Dialysis  Time-Out for Hemodialysis  What Procedure? HD  Pt Identifiers(min of two) First/Last Name;MRN/Account#  Correct Site? Yes  Correct Side? Yes  Correct Procedure? Yes  Consents Verified? Yes  Rad Studies Available? N/A  Safety Precautions Reviewed? Yes  Engineer, civil (consulting) Number 5  Station Number  (Bedside ICU )  UF/Alarm Test Passed  Conductivity: Meter 13.8  Conductivity: Machine  13.8  pH 7.2  Reverse Osmosis WRO3  Normal Saline Lot Number [id number]  Dialyzer Lot Number [id number]  Disposable Set Lot Number [id number]  Machine Temperature 98.6 F (37 C)  Musician and Audible Yes  Blood Lines Intact and Secured Yes  Pre Treatment Patient Checks  Vascular access used during treatment Catheter  Hepatitis B Surface Antigen Results Negative  Date Hepatitis B Surface Antigen Drawn 03/27/19  Hepatitis B Surface Antibody  (<10)  Date Hepatitis B Surface Antibody Drawn 03/27/19  Hemodialysis Consent Verified Yes  Hemodialysis Standing Orders Initiated Yes  ECG (Telemetry) Monitor On Yes  Prime Ordered Normal Saline  Length of  DialysisTreatment -hour(s) 2 Hour(s)  Dialysis Treatment Comments Na 140  Dialyzer Elisio 17H NR   Dialysate 2K, 2.5 Ca  Dialysis Anticoagulant None  Dialysate Flow Ordered 300  Blood Flow Rate Ordered 200 mL/min  Ultrafiltration Goal 1.5 Liters  Dialysis Blood Pressure Support Ordered Normal Saline  Hemodialysis Catheter Right Femoral vein Triple-lumen  Placement Date/Time: 04/12/19 1707   Placed prior to admission: No  Time Out: Correct patient;Correct site;Correct procedure  Maximum sterile barrier precautions: Hand hygiene;Sterile gloves;Cap;Large sterile sheet;Mask;Sterile gown  Site Prep: Chlorh...  Site Condition No complications  Blue Lumen Status Blood return noted  Red Lumen Status Blood return noted  Purple Lumen Status Capped (Central line)  Dressing Type Biopatch;Occlusive  Dressing Status Clean;Dry;Intact

## 2019-04-12 NOTE — ED Notes (Signed)
EDP to bedside to reassess patient.  Pt provided update at this time.

## 2019-04-12 NOTE — ED Notes (Signed)
Answered call bell patient was complainng of not  being able to breathe patient's o2 was 87% placed o2  back on patient ,now static are 94% and nurse Caryl Pina now entering the room.

## 2019-04-13 ENCOUNTER — Inpatient Hospital Stay: Payer: 59

## 2019-04-13 ENCOUNTER — Inpatient Hospital Stay (HOSPITAL_COMMUNITY)
Admit: 2019-04-13 | Discharge: 2019-04-13 | Disposition: A | Discharge status: Home / Self Care | Payer: 59 | Attending: Internal Medicine | Admitting: Internal Medicine

## 2019-04-13 DIAGNOSIS — R600 Localized edema: Secondary | ICD-10-CM

## 2019-04-13 DIAGNOSIS — I340 Nonrheumatic mitral (valve) insufficiency: Secondary | ICD-10-CM

## 2019-04-13 DIAGNOSIS — N186 End stage renal disease: Secondary | ICD-10-CM

## 2019-04-13 LAB — ECHOCARDIOGRAM COMPLETE
Height: 68 in
Weight: 2673.74 oz

## 2019-04-13 LAB — BASIC METABOLIC PANEL
Anion gap: 14 (ref 5–15)
BUN: 95 mg/dL — ABNORMAL HIGH (ref 8–23)
CO2: 23 mmol/L (ref 22–32)
Calcium: 7.3 mg/dL — ABNORMAL LOW (ref 8.9–10.3)
Chloride: 103 mmol/L (ref 98–111)
Creatinine, Ser: 12.33 mg/dL — ABNORMAL HIGH (ref 0.61–1.24)
GFR calc Af Amer: 4 mL/min — ABNORMAL LOW (ref >60)
GFR calc non Af Amer: 4 mL/min — ABNORMAL LOW (ref >60)
Glucose, Bld: 89 mg/dL (ref 70–99)
Potassium: 5.6 mmol/L — ABNORMAL HIGH (ref 3.5–5.1)
Sodium: 140 mmol/L (ref 135–145)

## 2019-04-13 LAB — CBC
HCT: 22.7 % — ABNORMAL LOW (ref 39.0–52.0)
Hemoglobin: 7.1 g/dL — ABNORMAL LOW (ref 13.0–17.0)
MCH: 27.1 pg (ref 26.0–34.0)
MCHC: 31.3 g/dL (ref 30.0–36.0)
MCV: 86.6 fL (ref 80.0–100.0)
Platelets: 280 10*3/uL (ref 150–400)
RBC: 2.62 MIL/uL — ABNORMAL LOW (ref 4.22–5.81)
RDW: 15.5 % (ref 11.5–15.5)
WBC: 8.4 10*3/uL (ref 4.0–10.5)
nRBC: 0 % (ref 0.0–0.2)

## 2019-04-13 LAB — TRANSFERRIN: Transferrin: 223 mg/dL (ref 180–329)

## 2019-04-13 MED ORDER — EPOETIN ALFA 10000 UNIT/ML IJ SOLN
4000.0000 [IU] | INTRAMUSCULAR | Status: DC
  Administered 2019-04-13: 4000 [IU] via INTRAVENOUS
  Filled 2019-04-13: qty 1

## 2019-04-13 MED ORDER — SODIUM CHLORIDE 0.9 % IV SOLN
200.0000 mg | Freq: Once | INTRAVENOUS | Status: AC
  Administered 2019-04-13: 200 mg via INTRAVENOUS
  Filled 2019-04-13: qty 10

## 2019-04-13 NOTE — Progress Notes (Signed)
Post HD Tx  2000 mL fluid removal tolerated well over 2.5hr tx.     04/13/19 1141  Vital Signs  Pulse Rate (!) 56  Pulse Rate Source Monitor  Resp 12  BP (!) 180/104  BP Location Left Arm  BP Method Automatic  Patient Position (if appropriate) Lying  Oxygen Therapy  SpO2 100 %  O2 Device Ventilator  End Tidal CO2 (EtCO2) 47  Pain Assessment  Pain Scale CPOT  Critical Care Pain Observation Tool (CPOT)  Facial Expression 0  Body Movements 0  Muscle Tension 0  Compliance with ventilator (intubated pts.) 0  Vocalization (extubated pts.) N/A  CPOT Total 0  Dialysis Weight  Weight 73.3 kg  Type of Weight Post-Dialysis  During Hemodialysis Assessment  Blood Flow Rate (mL/min) 200 mL/min  Arterial Pressure (mmHg) -100 mmHg  Venous Pressure (mmHg) 90 mmHg  Transmembrane Pressure (mmHg) 70 mmHg  Ultrafiltration Rate (mL/min) 800 mL/min  Dialysate Flow Rate (mL/min) 500 ml/min  Conductivity: Machine  13.8  HD Safety Checks Performed Yes  Dialysis Fluid Bolus Normal Saline  Bolus Amount (mL) 250 mL  Intra-Hemodialysis Comments Tx completed  Hemodialysis Catheter Right Femoral vein Triple-lumen  Placement Date/Time: 04/12/19 1707   Placed prior to admission: No  Time Out: Correct patient;Correct site;Correct procedure  Maximum sterile barrier precautions: Hand hygiene;Sterile gloves;Cap;Large sterile sheet;Mask;Sterile gown  Site Prep: Chlorh...  Site Condition No complications  Blue Lumen Status Flushed;Capped (Central line);Heparin locked  Red Lumen Status Flushed;Capped (Central line);Heparin locked  Catheter fill solution Heparin 1000 units/ml  Catheter fill volume (Arterial) 1.8 cc  Catheter fill volume (Venous) 1.8  Dressing Type Biopatch;Occlusive  Dressing Status New drainage (RN to change dressing. )  Post treatment catheter status Capped and Clamped

## 2019-04-13 NOTE — Plan of Care (Signed)
Patient is extubated to 4 lpm O2 nasal canula

## 2019-04-13 NOTE — Progress Notes (Signed)
Pre HD Tx   04/13/19 0845  Hand-Off documentation  Report given to (Full Name) Trellis Paganini RN  Report received from (Full Name) Casey Burkitt RN  Vital Signs  Temp 98.2 F (36.8 C)  Temp Source Axillary  Pulse Rate 71  Pulse Rate Source Monitor  Resp 11  BP (!) 166/91  BP Location Left Arm  BP Method Automatic  Patient Position (if appropriate) Lying  Oxygen Therapy  SpO2 98 %  O2 Device Ventilator  End Tidal CO2 (EtCO2) 48  Pain Assessment  Pain Scale CPOT  Critical Care Pain Observation Tool (CPOT)  Facial Expression 0  Body Movements 0  Muscle Tension 0  Compliance with ventilator (intubated pts.) 1  Vocalization (extubated pts.) N/A  CPOT Total 1  Dialysis Weight  Weight 75.5 kg  Type of Weight Pre-Dialysis  Time-Out for Hemodialysis  What Procedure? hemodialysis   Pt Identifiers(min of two) First/Last Name;MRN/Account#  Correct Site? Yes  Correct Side? Yes  Correct Procedure? Yes  Consents Verified? Yes  Rad Studies Available? N/A  Safety Precautions Reviewed? Yes  Engineer, civil (consulting) Number 1  Station Number  (ICU 12)  UF/Alarm Test Passed  Conductivity: Meter 14  Conductivity: Machine  13.8  pH 7.2  Reverse Osmosis WRO 3  Normal Saline Lot Number [id number]  Dialyzer Lot Number [id number]  Disposable Set Lot Number [id number]  Machine Temperature 98.6 F (37 C)  Musician and Audible Yes  Blood Lines Intact and Secured Yes  Pre Treatment Patient Checks  Vascular access used during treatment Catheter  Hepatitis B Surface Antigen Results Negative  Date Hepatitis B Surface Antigen Drawn 03/27/19  Hepatitis B Surface Antibody  (<10)  Date Hepatitis B Surface Antibody Drawn 03/27/19  Hemodialysis Consent Verified Yes  Hemodialysis Standing Orders Initiated Yes  ECG (Telemetry) Monitor On Yes  Prime Ordered Normal Saline  Length of  DialysisTreatment -hour(s) 2.5 Hour(s)  Dialysis Treatment Comments Na 140  Dialyzer Elisio 17H NR  Dialysate  2K, 2.5 Ca  Dialysate Flow Ordered 500  Blood Flow Rate Ordered 250 mL/min  Ultrafiltration Goal 1.5 Liters  Dialysis Blood Pressure Support Ordered Normal Saline  Education / Care Plan  Dialysis Education Provided No (Comment) (on vent / not responding to education)  Hemodialysis Catheter Right Femoral vein Triple-lumen  Placement Date/Time: 04/12/19 1707   Placed prior to admission: No  Time Out: Correct patient;Correct site;Correct procedure  Maximum sterile barrier precautions: Hand hygiene;Sterile gloves;Cap;Large sterile sheet;Mask;Sterile gown  Site Prep: Chlorh...  Site Condition No complications  Blue Lumen Status Capped (Central line)  Red Lumen Status Capped (Central line)  Dressing Type Biopatch;Occlusive  Dressing Status Clean;Dry;Intact  Drainage Description None

## 2019-04-13 NOTE — Progress Notes (Signed)
[facility], [place] 04/13/19  Subjective:   Patient was intubated yesterday for respiratory distress Underwent 1st HD treatment. 1500 cc fluid was removed No acute event reported   Objective:  Vital signs in last 24 hours:  Temp:  [96.8 F (36 C)-98.6 F (37 C)] 98.6 F (37 C) (05/13 0745) Pulse Rate:  [61-81] 71 (05/13 0808) Resp:  [7-44] 7 (05/13 0808) BP: (149-190)/(76-151) 166/91 (05/13 0808) SpO2:  [86 %-100 %] 98 % (05/13 0808) FiO2 (%):  [40 %] 40 % (05/13 0828) Weight:  [75.8 kg] 75.8 kg (05/12 1755)  Weight change:  Filed Weights   04/12/19 0916 04/12/19 1755  Weight: 75.8 kg 75.8 kg    Intake/Output:    Intake/Output Summary (Last 24 hours) at 04/13/2019 0836 Last data filed at 04/13/2019 0745 Gross per 24 hour  Intake 266.35 ml  Output 2226 ml  Net -1959.65 ml     Physical Exam: General:  Critically ill-appearing, laying in the bed  HEENT  ETT in place  Neck  distended neck veins  Pulm/lungs   Vent assisted. Fio2 40 %, clear b/l  CVS/Heart   regular  Abdomen:   Soft, mildly distended  Extremities:  + pitting edema  Neurologic:  sedated  Skin:  No acute distress  Access:  right femoral catheter       Basic Metabolic Panel:  Recent Labs  Lab 04/12/19 0926 04/12/19 1936 04/13/19 0450  NA 138  --  140  K 5.2*  --  5.6*  CL 105  --  103  CO2 15*  --  23  GLUCOSE 113*  --  89  BUN 115*  --  95*  CREATININE 14.93*  --  12.33*  CALCIUM 7.4*  --  7.3*  PHOS  --  5.5*  --      CBC: Recent Labs  Lab 04/12/19 0926 04/13/19 0450  WBC 7.8 8.4  NEUTROABS 6.2  --   HGB 7.8* 7.1*  HCT 24.4* 22.7*  MCV 85.0 86.6  PLT 287 280      Lab Results  Component Value Date   HEPBSAG Negative 03/27/2019   HEPBSAB Non Reactive 03/27/2019      Microbiology:  Recent Results (from the past 240 hour(s))  SARS Coronavirus 2 (CEPHEID- Performed in [place] hospital lab), Hosp Order     Status: None   Collection  Time: 04/12/19  9:26 AM  Result Value Ref Range Status   SARS Coronavirus 2 NEGATIVE NEGATIVE Final    Comment: (NOTE) If result is NEGATIVE SARS-CoV-2 target nucleic acids are NOT DETECTED. The SARS-CoV-2 RNA is generally detectable in upper and lower  respiratory specimens during the acute phase of infection. The lowest  concentration of SARS-CoV-2 viral copies this assay can detect is 250  copies / mL. A negative result does not preclude SARS-CoV-2 infection  and should not be used as the sole basis for treatment or other  patient management decisions.  A negative result may occur with  improper specimen collection / handling, submission of specimen other  than nasopharyngeal swab, presence of viral mutation(s) within the  areas targeted by this assay, and inadequate number of viral copies  (<250 copies / mL). A negative result must be combined with clinical  observations, patient history, and epidemiological information. If result is POSITIVE SARS-CoV-2 target nucleic acids are DETECTED. The SARS-CoV-2 RNA is generally detectable in upper and lower  respiratory specimens dur ing the acute phase of infection.  Positive  results are indicative of active infection with SARS-CoV-2.  Clinical  correlation with patient history and other diagnostic information is  necessary to determine patient infection status.  Positive results do  not rule out bacterial infection or co-infection with other viruses. If result is PRESUMPTIVE POSTIVE SARS-CoV-2 nucleic acids MAY BE PRESENT.   A presumptive positive result was obtained on the submitted specimen  and confirmed on repeat testing.  While 2019 novel coronavirus  (SARS-CoV-2) nucleic acids may be present in the submitted sample  additional confirmatory testing may be necessary for epidemiological  and / or clinical management purposes  to differentiate between  SARS-CoV-2 and other Sarbecovirus currently known to infect humans.  If  clinically indicated additional testing with an alternate test  methodology [phone]) is advised. The SARS-CoV-2 RNA is generally  detectable in upper and lower respiratory sp ecimens during the acute  phase of infection. The expected result is Negative. Fact Sheet for Patients:  StrictlyIdeas.no Fact Sheet for Healthcare Providers: BankingDealers.co.za This test is not yet approved or cleared by the [place] FDA and has been authorized for detection and/or diagnosis of SARS-CoV-2 by FDA under an Emergency Use Authorization (EUA).  This EUA will remain in effect (meaning this test can be used) for the duration of the COVID-19 declaration under Section 564(b)(1) of the Act, 21 U.S.C. section 360bbb-3(b)(1), unless the authorization is terminated or revoked sooner. Performed at [facility], [address] [place], [place] [address]   MRSA PCR Screening     Status: None   Collection Time: 04/12/19  4:56 PM  Result Value Ref Range Status   MRSA by PCR NEGATIVE NEGATIVE Final    Comment:        The GeneXpert MRSA Assay (FDA approved for NASAL specimens only), is one component of a comprehensive MRSA colonization surveillance program. It is not intended to diagnose MRSA infection nor to guide or monitor treatment for MRSA infections. Performed at [facility], [address] [place], [place] [address]     Coagulation Studies: No results for input(s): LABPROT, INR in the last 72 hours.  Urinalysis: No results for input(s): COLORURINE, LABSPEC, PHURINE, GLUCOSEU, HGBUR, BILIRUBINUR, KETONESUR, PROTEINUR, UROBILINOGEN, NITRITE, LEUKOCYTESUR in the last 72 hours.  Invalid input(s): APPERANCEUR    Imaging: Dg Chest Port 1 View  Result Date: 04/12/2019 CLINICAL DATA:  Shortness of breath.  Intubation. EXAM: PORTABLE CHEST 1 VIEW COMPARISON:  04/12/2019 FINDINGS: Again identified are diffuse hazy bilateral  airspace opacities with significant interval worsening when compared to prior study. The endotracheal tube terminates above the carina by approximately 4.4 cm. The enteric tube extends below the left hemidiaphragm. There is no pneumothorax. No large pleural effusion. The cardiac silhouette is mildly enlarged. IMPRESSION: 1. Lines and tubes as above. 2. Significant interval worsening of diffuse hazy bilateral airspace opacities which can be seen in patients with pulmonary edema or an atypical infectious process such as viral pneumonia. Electronically Signed   By: Constance Holster M.D.   On: 04/12/2019 18:45   Dg Chest Portable 1 View  Result Date: 04/12/2019 CLINICAL DATA:  Shortness of breath and swelling of the lower extremities, worsening recently. EXAM: PORTABLE CHEST 1 VIEW COMPARISON:  03/27/2019 FINDINGS: Central line has been removed. Mild cardiomegaly and aortic atherosclerosis as seen previously. Widespread pulmonary opacity, left more than right, could represent fluid overload/edema and/or pneumonia. There may be a small amount of pleural fluid on the left. IMPRESSION: Nonspecific widespread bilateral pulmonary density that could represent edema  and/or pneumonia. Electronically Signed   By: Nelson Chimes M.D.   On: 04/12/2019 09:56   Dg Abd Portable 1v  Result Date: 04/12/2019 CLINICAL DATA:  Line placement EXAM: PORTABLE ABDOMEN - 1 VIEW COMPARISON:  None available FINDINGS: There is a dialysis catheter with tip projecting at the level of the L2 vertebral body. The enteric tube tip projects over the gastric body. The bowel gas pattern is nonspecific and nonobstructive. IMPRESSION: 1. Nonobstructive bowel gas pattern. 2. Lines and tubes as above. Electronically Signed   By: Constance Holster M.D.   On: 04/12/2019 18:47     Medications:   . fentaNYL infusion INTRAVENOUS 200 mcg/hr (04/13/19 0745)  . iron sucrose     . amLODipine  10 mg Oral Daily  . chlorhexidine gluconate (MEDLINE KIT)   15 mL Mouth Rinse BID  . Chlorhexidine Gluconate Cloth  6 each Topical Q0600  . epoetin (EPOGEN/PROCRIT) injection  4,000 Units Intravenous Q M,W,F-HD  . heparin  5,000 Units Subcutaneous Q8H  . labetalol  100 mg Oral BID  . mouth rinse  15 mL Mouth Rinse 10 times per day  . minoxidil  5 mg Oral Daily  . multivitamin with minerals  1 tablet Oral Daily   acetaminophen **OR** acetaminophen, hydrALAZINE, midazolam, midazolam, nitroGLYCERIN, polyethylene glycol  Assessment/ Plan:  63 y.o. African-American male with hypertension, BPH, advanced CKD, was admitted on 03/27/2019 with uremia and acute pulmonary edema.   1.  End-stage renal disease 2.  Acute pulmonary edema/Acute respiratory failure 3.  Severe hypertension with CKD 4.  Lower extremity edema 5.  Hyperkalemia 6.  Anemia, likely of chronic kidney disease 7.  2D echo from September 2019 show moderate concentric left ventricular hypertrophy, LVEF 55 to 00%, grade 1 diastolic dysfunction, severely dilated left atrium  Started on HD 2nd HD treatment today Volume removal as tolerated Venofer/EPO for Anemia of CKD and iron deficiency    LOS: [place] 5/13/20208:36 AM  [place], [place]  Note: This note was prepared with Dragon dictation. Any transcription errors are unintentional

## 2019-04-13 NOTE — Progress Notes (Signed)
Pre HD Assessment    04/13/19 0900  Neurological  Level of Consciousness Responds to Voice  Orientation Level Intubated/Tracheostomy - Unable to assess  Respiratory  Respiratory Pattern Regular  Chest Assessment Chest expansion symmetrical  Bilateral Breath Sounds Diminished  Cardiac  Pulse Regular  Heart Sounds S1, S2  Cardiac Rhythm NSR  Vascular  R Radial Pulse +2  L Radial Pulse +2  Musculoskeletal  Musculoskeletal (WDL) X  Generalized Weakness Yes  Gastrointestinal  Bowel Sounds Assessment Active  GU Assessment  Genitourinary (WDL) X  Genitourinary Symptoms  (HD pt )  Psychosocial  Psychosocial (WDL) X  Patient Behaviors Restricted affect;Appropriate for situation  Emotional support given Given to patient

## 2019-04-13 NOTE — Progress Notes (Signed)
HD Tx Start    04/13/19 0910  Vital Signs  Pulse Rate 63  Resp 11  BP (!) 158/86  Oxygen Therapy  SpO2 99 %  End Tidal CO2 (EtCO2) 48  During Hemodialysis Assessment  Blood Flow Rate (mL/min) 200 mL/min  Arterial Pressure (mmHg) -90 mmHg  Venous Pressure (mmHg) 70 mmHg  Transmembrane Pressure (mmHg) 60 mmHg  Ultrafiltration Rate (mL/min) 800 mL/min (800 mL per HOUR removal )  Dialysate Flow Rate (mL/min) 500 ml/min  Conductivity: Machine  13.8  HD Safety Checks Performed Yes  Dialysis Fluid Bolus Normal Saline  Bolus Amount (mL) 250 mL  Intra-Hemodialysis Comments Tx initiated

## 2019-04-13 NOTE — Progress Notes (Signed)
*  PRELIMINARY RESULTS* Echocardiogram 2D Echocardiogram has been performed.  Devin Harmon 04/13/2019, 9:42 AM

## 2019-04-13 NOTE — Progress Notes (Signed)
Underwent repeat dialysis this morning.  Passed SBT after dialysis and extubated under my supervision.  Tolerating well.  Cognition intact.  No distress.  No new complaints.  Vitals:   04/13/19 1200 04/13/19 1217 04/13/19 1300 04/13/19 1400  BP: (!) 178/88 (!) 158/93 (!) 174/89 (!) 162/94  Pulse: 66 74 77 72  Resp: 11 11 12  (!) 8  Temp:  98.3 F (36.8 C)    TempSrc:  Axillary    SpO2: 100% 99% 96% 98%  Weight:      Height:      4 LPM [place]  Gen: NAD HEENT: NCAT, sclerae white Neck: JVP not visualized Lungs: Clear anteriorly Cardiovascular: RRR, no murmurs Abdomen: Soft, nontender, normal BS Ext: R >L LE edema Neuro: grossly intact Skin: Limited exam, no lesions noted  BMP Latest Ref Rng & Units 04/13/2019 04/12/2019 03/29/2019  Glucose 70 - 99 mg/dL 89 113(H) 108(H)  BUN 8 - 23 mg/dL 95(H) 115(H) 87(H)  Creatinine 0.61 - 1.24 mg/dL 12.33(H) 14.93(H) 11.48(H)  Sodium 135 - 145 mmol/L 140 138 142  Potassium 3.5 - 5.1 mmol/L 5.6(H) 5.2(H) 4.2  Chloride 98 - 111 mmol/L 103 105 105  CO2 22 - 32 mmol/L 23 15(L) 23  Calcium 8.9 - 10.3 mg/dL 7.3(L) 7.4(L) 7.3(L)   CBC Latest Ref Rng & Units 04/13/2019 04/12/2019 03/29/2019  WBC 4.0 - 10.5 K/uL 8.4 7.8 8.7  Hemoglobin 13.0 - 17.0 g/dL 7.1(L) 7.8(L) 8.5(L)  Hematocrit 39.0 - 52.0 % 22.7(L) 24.4(L) 25.6(L)  Platelets 150 - 400 K/uL 280 287 234   CXR: NNF  IMPRESSION: Acute hypoxemic/hypercarbic respiratory failure Acute pulmonary edema due to volume overload ESRD Asymmetric lower extremity edema  PLAN/REC: Extubated under my supervision this morning.  Tolerating well Continue supplemental oxygen as needed Recheck CXR in a.m. Management of renal disease per nephrology Lower extremity venous Doppler studies ordered  Merton Border, MD PCCM service Mobile [phone] Pager [phone] 04/13/2019 3:31 PM

## 2019-04-13 NOTE — Progress Notes (Signed)
Post HD Tx 2000 mL fluid removal tolerated well over 2.5hr tx.    04/13/19 1145  Vital Signs  Temp 98.5 F (36.9 C)  Temp Source Axillary  Pulse Rate 80  Pulse Rate Source Monitor  Resp 11  BP (!) 182/93  BP Location Left Arm  BP Method Automatic  Patient Position (if appropriate) Lying  Oxygen Therapy  SpO2 100 %  O2 Device Ventilator  End Tidal CO2 (EtCO2) 49  Post-Hemodialysis Assessment  Rinseback Volume (mL) 250 mL  Dialyzer Clearance Lightly streaked  Duration of HD Treatment -hour(s) 2.5 hour(s)  Hemodialysis Intake (mL) 500 mL  UF Total -Machine (mL) 2500 mL  Net UF (mL) 2000 mL  Tolerated HD Treatment Yes  Hemodialysis Catheter Right Femoral vein Triple-lumen  Placement Date/Time: 04/12/19 1707   Placed prior to admission: No  Time Out: Correct patient;Correct site;Correct procedure  Maximum sterile barrier precautions: Hand hygiene;Sterile gloves;Cap;Large sterile sheet;Mask;Sterile gown  Site Prep: Chlorh...  Post treatment catheter status Capped and Clamped

## 2019-04-13 NOTE — Progress Notes (Signed)
Post HD Assessment 2000 mL fluid removal tolerated well over 2.5hr tx.    04/13/19 1149  Neurological  Level of Consciousness Responds to Voice  Orientation Level Intubated/Tracheostomy - Unable to assess  Respiratory  Respiratory Pattern Regular  Chest Assessment Chest expansion symmetrical  Bilateral Breath Sounds Diminished  Cardiac  Pulse Regular  Heart Sounds S1, S2  Cardiac Rhythm NSR  Vascular  R Radial Pulse +2  L Radial Pulse +2  Musculoskeletal  Musculoskeletal (WDL) X  Generalized Weakness Yes  Gastrointestinal  Bowel Sounds Assessment Active  GU Assessment  Genitourinary (WDL) X  Genitourinary Symptoms  (HD pt )  Psychosocial  Psychosocial (WDL) X  Patient Behaviors Restricted affect;Appropriate for situation  Emotional support given Given to patient

## 2019-04-13 NOTE — Progress Notes (Signed)
[place] [facility] [place] NAME: Amel Gianino    MR#:  [id number]  DATE OF BIRTH:  Apr 20, 1956  SUBJECTIVE:   Patient currently intubated on the ventilator. He was failing BiPAP and went into acute respiratory distress. Currently getting dialyzed. REVIEW OF SYSTEMS:   Review of Systems  Unable to perform ROS: Intubated     DRUG ALLERGIES:   Allergies  Allergen Reactions  . Chlorthalidone     REACTION: felt "bad" and ED  . Hydrocodone Other (See Comments)    Nausea and dizzy  . Sulfonamide Derivatives Hives    REACTION: unspecified    VITALS:  Blood pressure (!) 178/88, pulse 66, temperature 98.5 F (36.9 C), temperature source Axillary, resp. rate 11, height 5\' 8"  (1.727 m), weight 73.3 kg, SpO2 100 %.  PHYSICAL EXAMINATION:   Physical Exam  GENERAL:  63 y.o.-year-old patient lying in the bed with no acute distress. Critically ill EYES: Pupils equal, round, reactive to light and accommodation. No scleral icterus. Extraocular muscles intact.  HEENT: Head atraumatic, normocephalic. Oropharynx and nasopharynx clear. Intubated NECK:  Supple, no jugular venous distention. No thyroid enlargement, no tenderness.  LUNGS: Normal breath sounds bilaterally, no wheezing, rales, rhonchi. No use of accessory muscles of respiration.  CARDIOVASCULAR: S1, S2 normal. No murmurs, rubs, or gallops.  ABDOMEN: Soft, nontender, nondistended. Bowel sounds present. No organomegaly or mass.  EXTREMITIES: No cyanosis, clubbing or edema b/l.    NEUROLOGIC: on the vent  PSYCHIATRIC:  On the vent SKIN: No obvious rash, lesion, or ulcer.   LABORATORY PANEL:  CBC Recent Labs  Lab 04/13/19 0450  WBC 8.4  HGB 7.1*  HCT 22.7*  PLT 280    Chemistries  Recent Labs  Lab 04/12/19 0926 04/13/19 0450  NA 138 140  K 5.2* 5.6*  CL 105 103  CO2 15* 23  GLUCOSE 113* 89  BUN 115* 95*  CREATININE 14.93* 12.33*  CALCIUM 7.4* 7.3*  AST 16  --   ALT  17  --   ALKPHOS 47  --   BILITOT 0.5  --    Cardiac Enzymes Recent Labs  Lab 04/12/19 0926  TROPONINI <0.03   RADIOLOGY:  Dg Chest Port 1 View  Result Date: 04/12/2019 CLINICAL DATA:  Shortness of breath.  Intubation. EXAM: PORTABLE CHEST 1 VIEW COMPARISON:  04/12/2019 FINDINGS: Again identified are diffuse hazy bilateral airspace opacities with significant interval worsening when compared to prior study. The endotracheal tube terminates above the carina by approximately 4.4 cm. The enteric tube extends below the left hemidiaphragm. There is no pneumothorax. No large pleural effusion. The cardiac silhouette is mildly enlarged. IMPRESSION: 1. Lines and tubes as above. 2. Significant interval worsening of diffuse hazy bilateral airspace opacities which can be seen in patients with pulmonary edema or an atypical infectious process such as viral pneumonia. Electronically Signed   By: Constance Holster M.D.   On: 04/12/2019 18:45   Dg Chest Portable 1 View  Result Date: 04/12/2019 CLINICAL DATA:  Shortness of breath and swelling of the lower extremities, worsening recently. EXAM: PORTABLE CHEST 1 VIEW COMPARISON:  03/27/2019 FINDINGS: Central line has been removed. Mild cardiomegaly and aortic atherosclerosis as seen previously. Widespread pulmonary opacity, left more than right, could represent fluid overload/edema and/or pneumonia. There may be a small amount of pleural fluid on the left. IMPRESSION: Nonspecific widespread bilateral pulmonary density that could represent edema and/or pneumonia. Electronically Signed   By: Jan Fireman.D.  On: 04/12/2019 09:56   Dg Abd Portable 1v  Result Date: 04/12/2019 CLINICAL DATA:  Line placement EXAM: PORTABLE ABDOMEN - 1 VIEW COMPARISON:  None available FINDINGS: There is a dialysis catheter with tip projecting at the level of the L2 vertebral body. The enteric tube tip projects over the gastric body. The bowel gas pattern is nonspecific and  nonobstructive. IMPRESSION: 1. Nonobstructive bowel gas pattern. 2. Lines and tubes as above. Electronically Signed   By: Constance Holster M.D.   On: 04/12/2019 18:47   ASSESSMENT AND PLAN:   Caius Silbernagel  is a 63 y.o. male with a known history of end-stage renal disease was started on dialysis in April with similar symptoms of acute on chronic shortness of breath. Patient refused dialysis at prior to discharge was placed on hospice and discharged to home. He became short of breath again now presents with worsening short of breath and lower extremity edema.   1. Acute hypoxic respiratory failure secondary to severe pulmonary edema in the setting of end-stage renal disease-- now intubated and on the ventilator -patient had declined dialysis when he was discharged home with hospice in April 2020. -Patient was seen by Dr. Candiss Norse.  -Patient currently getting dialyzed  2. End-stage renal disease on hemodialysis -patient now agreeable for dialysis. -Education officer, museum for outpatient dialysis chair time -patient will need PPD and hepatitis panel-- to be done by Dr. Candiss Norse  3. Malignant hypertension -continue home meds. PRN IV hydralazine  4. Anemia of chronic disease in the setting of end-stage renal disease -per Dr. Candiss Norse  5. DVT prophylaxis subcu heparin  Case discussed with Care Management/Social Worker.  CODE STATUS: full  DVT Prophylaxis:heparin  TOTAL TIME TAKING CARE OF THIS PATIENT: *25* minutes.  >50% time spent on counselling and coordination of care  POSSIBLE D/C IN *few* DAYS, DEPENDING ON CLINICAL CONDITION.  Note: This dictation was prepared with Dragon dictation along with smaller phrase technology. Any transcriptional errors that result from this process are unintentional.  Fritzi Mandes M.D on 04/13/2019 at 12:17 PM  Between 7am to 6pm - Pager - [phone]  After 6pm go to [email or web address] - password [email or web address] [facility]  Office   [phone]  CC: Primary care physician; Venia Carbon, MDPatient ID: Lorna Dibble, male   DOB: 03/11/1956, 63 y.o.   MRN: [id number]

## 2019-04-14 ENCOUNTER — Inpatient Hospital Stay: Payer: 59

## 2019-04-14 LAB — CBC
HCT: 24.9 % — ABNORMAL LOW (ref 39.0–52.0)
Hemoglobin: 7.8 g/dL — ABNORMAL LOW (ref 13.0–17.0)
MCH: 27.5 pg (ref 26.0–34.0)
MCHC: 31.3 g/dL (ref 30.0–36.0)
MCV: 87.7 fL (ref 80.0–100.0)
Platelets: 266 10*3/uL (ref 150–400)
RBC: 2.84 MIL/uL — ABNORMAL LOW (ref 4.22–5.81)
RDW: 15.3 % (ref 11.5–15.5)
WBC: 8.6 10*3/uL (ref 4.0–10.5)
nRBC: 0 % (ref 0.0–0.2)

## 2019-04-14 LAB — BASIC METABOLIC PANEL
Anion gap: 15 (ref 5–15)
BUN: 68 mg/dL — ABNORMAL HIGH (ref 8–23)
CO2: 26 mmol/L (ref 22–32)
Calcium: 7.6 mg/dL — ABNORMAL LOW (ref 8.9–10.3)
Chloride: 100 mmol/L (ref 98–111)
Creatinine, Ser: 9.52 mg/dL — ABNORMAL HIGH (ref 0.61–1.24)
GFR calc Af Amer: 6 mL/min — ABNORMAL LOW (ref >60)
GFR calc non Af Amer: 5 mL/min — ABNORMAL LOW (ref >60)
Glucose, Bld: 106 mg/dL — ABNORMAL HIGH (ref 70–99)
Potassium: 4.9 mmol/L (ref 3.5–5.1)
Sodium: 141 mmol/L (ref 135–145)

## 2019-04-14 LAB — PARATHYROID HORMONE, INTACT (NO CA): PTH: 167 pg/mL — ABNORMAL HIGH (ref 15–65)

## 2019-04-14 LAB — HEPATITIS C ANTIBODY: HCV Ab: <0.1 s/co ratio (ref 0.0–0.9)

## 2019-04-14 MED ORDER — LORAZEPAM 2 MG/ML IJ SOLN
1.0000 mg | Freq: Once | INTRAMUSCULAR | Status: DC
  Filled 2019-04-14: qty 1

## 2019-04-14 MED ORDER — EPOETIN ALFA 10000 UNIT/ML IJ SOLN
4000.0000 [IU] | INTRAMUSCULAR | Status: DC
  Administered 2019-04-14 – 2019-04-16 (×2): 4000 [IU] via INTRAVENOUS

## 2019-04-14 MED ORDER — FUROSEMIDE 10 MG/ML IJ SOLN
80.0000 mg | Freq: Two times a day (BID) | INTRAMUSCULAR | Status: DC
  Administered 2019-04-14 (×2): 80 mg via INTRAVENOUS
  Filled 2019-04-14 (×2): qty 8

## 2019-04-14 MED ORDER — HALOPERIDOL LACTATE 5 MG/ML IJ SOLN
5.0000 mg | INTRAMUSCULAR | Status: DC | PRN
  Administered 2019-04-14 – 2019-04-15 (×2): 5 mg via INTRAMUSCULAR
  Filled 2019-04-14 (×2): qty 1

## 2019-04-14 NOTE — Progress Notes (Signed)
Notified MD of pt becoming agitated. Pt is trying to pull foley out and stating that he is going to go home. Pt also has a trialysis femoral catheter in place. Orders placed. Will continue to monitor and assess

## 2019-04-14 NOTE — TOC Initial Note (Signed)
Transition of Care [facility]) - Initial/Assessment Note    Patient Details  Name: Devin Harmon MRN: [id number] Date of Birth: [birth year]  [facility]) CM/SW Contact:    Shelbie Hutching, RN Phone Number: 04/14/2019, 10:51 AM  Clinical Narrative:                 Patient admitted with acute pulmonary edema requiring intubation.  Patient was extubated yesterday and is tolerating [place].  Patient is doing well and will transfer to the floor today.  Patient is slow to answer questions and slurs speech but is able to respond appropriately.  Patient reports that he is independent at home and requires no assistive devices.  Patient's wife reports that the patient mows the yard, feeds the chickens and is very independent.  Patient will be a new dialysis patient at discharge, patient initially refused HD but is now agreeable.  Elvera Bicker is aware of this patient and is working on finding a clinic and chair time in [place].  Patient and patient's wife do not think that he will need home health at discharge.  RNCM will cont to follow and assist with discharge planning.   Expected Discharge Plan: [place] Barriers to Discharge: Continued Medical Work up   Patient Goals and CMS Choice Patient states their goals for this hospitalization and ongoing recovery are:: Patient reports that he wants to know what happened, he is not sure how he got here.         Expected Discharge Plan and Services Expected Discharge Plan: [place] arrangements for the past 2 months: Single Family Home                                      Prior Living Arrangements/Services Living arrangements for the past 2 months: Single Family Home Lives with:: Spouse   Do you feel safe going back to the place where you live?: Yes      Need for Family Participation in Patient Care: Yes (Comment)(will be starting HD ) Care giver support system in place?: Yes  (comment)(wife)   Criminal Activity/Legal Involvement Pertinent to Current Situation/Hospitalization: No - Comment as needed  Activities of Daily Living      Permission Sought/Granted                  Emotional Assessment Appearance:: Appears stated age Attitude/Demeanor/Rapport: Engaged Affect (typically observed): Accepting Orientation: : Oriented to Self, Oriented to Place, Oriented to  Time Alcohol / Substance Use: Not Applicable Psych Involvement: No (comment)  Admission diagnosis:  End stage renal disease ([place]) [N18.6] Acute pulmonary edema (H[facility]) [J81.0] Acute respiratory failure with hypoxia ([place]) [J96.01] Patient Active Problem List   Diagnosis Date Noted  . Acute pulmonary edema ([place]) 04/12/2019  . Acute respiratory failure with hypoxia ([place]) 03/27/2019  . Allergic rhinitis due to pollen 02/10/2019  . Chronic renal failure, stage 5 (HCC) 08/28/2018  . Hematoma of abdominal wall 10/07/2017  . Routine general medical examination at a health care facility 06/20/2011  . Malignant hypertension 02/28/2010  . BPH without obstruction/lower urinary tract symptoms 01/04/2008  . Essential hypertension, benign 08/19/2007   PCP:  Venia Carbon, MD Pharmacy:   [id number] - [place], [address] [place] [address] Phone:  [phone] Fax: [phone]     Social Determinants of Health (SDOH) Interventions    Readmission Risk Interventions Readmission Risk Prevention Plan 03/30/2019  Transportation Screening Complete  PCP or Specialist Appt within 5-7 Days Complete  Home Care Screening Complete  Medication Review (RN CM) Complete  Some recent data might be hidden

## 2019-04-14 NOTE — Progress Notes (Signed)
Hemodialysis treatment completed at 1730. Net UF removed 2L. Blood rinsed back, CVC flushed with NS and locked with heparin. Red caps replaced. Dressing remains intact and insertion site remains without signs and symptoms of infection.  Post hemodialysis report given to L. Hassell Done, RN.    04/14/19 1730  Hand-Off documentation  Report given to (Full Name) L. Hassell Done, RN  Vital Signs  Temp 98 F (36.7 C)  Temp Source Axillary  Pulse Rate 66  Resp 16  BP (!) 144/87  BP Location Right Arm  BP Method Automatic  Patient Position (if appropriate) Lying  During Hemodialysis Assessment  Intra-Hemodialysis Comments Tx completed  Post-Hemodialysis Assessment  Rinseback Volume (mL) 250 mL  KECN 50.5 V  Dialyzer Clearance Clear  Duration of HD Treatment -hour(s) 3 hour(s)  Hemodialysis Intake (mL) 500 mL  UF Total -Machine (mL) 2500 mL  Net UF (mL) 2000 mL  Tolerated HD Treatment Yes  Education / Care Plan  Dialysis Education Provided Yes  Documented Education in Care Plan Yes  Outpatient Plan of Care Reviewed and on Chart Yes

## 2019-04-14 NOTE — Progress Notes (Signed)
Pre hemodialysis report received from L. Hassell Done, RN. Received patient in acute room via bed. Awake, alert and verbally responsive. No acute distress noted. CVC without signs and symptoms of infection.  Dressing in place and uncompromised. Accessed CVC per policy and found patent on each side. Hemodialysis treatment initiated at 1430 Plan for 3 hours treatment with UF of 1.5-2 L as tolerated.    04/14/19 1430  Hand-Off documentation  Report received from (Full Name) LindseyMartin, RN  Vital Signs  Temp 98 F (36.7 C)  Temp Source Oral  Resp 19  BP (!) 148/81  BP Location Right Arm  BP Method Automatic  Patient Position (if appropriate) Lying  Time-Out for Hemodialysis  What Procedure? dialysis  Pt Identifiers(min of two) First/Last Name;MRN/Account#  Correct Site? Yes  Correct Side? Yes  Correct Procedure? Yes  Consents Verified? Yes  Rad Studies Available? N/A  Safety Precautions Reviewed? Yes  Engineer, civil (consulting) Number 3  Station Number 2  UF/Alarm Test Passed  Conductivity: Meter 14  Conductivity: Machine  13.9  pH 7.4  Reverse Osmosis main  Normal Saline Lot Number [id number]  Dialyzer Lot Number [id number]  Disposable Set Lot Number [id number]  Machine Temperature 98.6 F (37 C)  Musician and Audible Yes  Blood Lines Intact and Secured Yes  Pre Treatment Patient Checks  Vascular access used during treatment Catheter  Hepatitis B Surface Antigen Results Negative  Date Hepatitis B Surface Antigen Drawn 03/29/19  Hepatitis B Surface Antibody  (susceptible)  Date Hepatitis B Surface Antibody Drawn 03/29/19  Hemodialysis Consent Verified Yes  Hemodialysis Standing Orders Initiated Yes  ECG (Telemetry) Monitor On Yes  Prime Ordered Normal Saline  Length of  DialysisTreatment -hour(s) 3 Hour(s)  Dialysis Treatment Comments start tx  Dialyzer Elisio 17H NR  Dialysate 2K, 2.5 Ca  Dialysate Flow Ordered 600  Blood Flow Rate Ordered 300 mL/min  Ultrafiltration  Goal 2000 Liters  During Hemodialysis Assessment  Blood Flow Rate (mL/min) 300 mL/min  Arterial Pressure (mmHg) -150 mmHg  Venous Pressure (mmHg) 130 mmHg  Transmembrane Pressure (mmHg) 70 mmHg  Ultrafiltration Rate (mL/min) 830 mL/min  Dialysate Flow Rate (mL/min) 600 ml/min  Conductivity: Machine  13.8  HD Safety Checks Performed Yes  Bolus Amount (mL) 250 mL (prime)  Intra-Hemodialysis Comments Tx initiated

## 2019-04-14 NOTE — Progress Notes (Signed)
[facility], [place] 04/14/19  Subjective:   Patient is now extubated Doing better  2000 cc removed with HD yesterday NPO at present, but will get diet later today  Objective:  Vital signs in last 24 hours:  Temp:  [97.9 F (36.6 C)-98.5 F (36.9 C)] 97.9 F (36.6 C) (05/14 0400) Pulse Rate:  [56-83] 68 (05/14 0600) Resp:  [8-14] 11 (05/14 0600) BP: (140-182)/(65-104) 158/85 (05/14 0600) SpO2:  [96 %-100 %] 100 % (05/14 0600) FiO2 (%):  [40 %] 40 % (05/13 1217) Weight:  [73.3 kg] 73.3 kg (05/13 1141)  Weight change: -0.251 kg Filed Weights   04/12/19 1755 04/13/19 0845 04/13/19 1141  Weight: 75.8 kg 75.5 kg 73.3 kg    Intake/Output:    Intake/Output Summary (Last 24 hours) at 04/14/2019 0846 Last data filed at 04/14/2019 0600 Gross per 24 hour  Intake 43.17 ml  Output 2560 ml  Net -2516.83 ml     Physical Exam: General:  ill-appearing, laying in the bed  HEENT  moist oral mucus membranes  Neck  distended neck veins  Pulm/lungs  diffuse b/l crackles  CVS/Heart   regular  Abdomen:   Soft, mildly distended  Extremities:  + pitting edema  Neurologic:  Alert and oriented  Skin:  No acute distress  Access:  right femoral temp catheter       Basic Metabolic Panel:  Recent Labs  Lab 04/12/19 0926 04/12/19 1936 04/13/19 0450 04/14/19 0442  NA 138  --  140 141  K 5.2*  --  5.6* 4.9  CL 105  --  103 100  CO2 15*  --  23 26  GLUCOSE 113*  --  89 106*  BUN 115*  --  95* 68*  CREATININE 14.93*  --  12.33* 9.52*  CALCIUM 7.4*  --  7.3* 7.6*  PHOS  --  5.5*  --   --      CBC: Recent Labs  Lab 04/12/19 0926 04/13/19 0450 04/14/19 0442  WBC 7.8 8.4 8.6  NEUTROABS 6.2  --   --   HGB 7.8* 7.1* 7.8*  HCT 24.4* 22.7* 24.9*  MCV 85.0 86.6 87.7  PLT 287 280 266      Lab Results  Component Value Date   HEPBSAG Negative 03/27/2019   HEPBSAB Non Reactive 03/27/2019      Microbiology:  Recent Results (from the past 240  hour(s))  SARS Coronavirus 2 (CEPHEID- Performed in [place] hospital lab), Hosp Order     Status: None   Collection Time: 04/12/19  9:26 AM  Result Value Ref Range Status   SARS Coronavirus 2 NEGATIVE NEGATIVE Final    Comment: (NOTE) If result is NEGATIVE SARS-CoV-2 target nucleic acids are NOT DETECTED. The SARS-CoV-2 RNA is generally detectable in upper and lower  respiratory specimens during the acute phase of infection. The lowest  concentration of SARS-CoV-2 viral copies this assay can detect is 250  copies / mL. A negative result does not preclude SARS-CoV-2 infection  and should not be used as the sole basis for treatment or other  patient management decisions.  A negative result may occur with  improper specimen collection / handling, submission of specimen other  than nasopharyngeal swab, presence of viral mutation(s) within the  areas targeted by this assay, and inadequate number of viral copies  (<250 copies / mL). A negative result must be combined with clinical  observations, patient history, and epidemiological information. If result is POSITIVE SARS-CoV-2 target  nucleic acids are DETECTED. The SARS-CoV-2 RNA is generally detectable in upper and lower  respiratory specimens dur ing the acute phase of infection.  Positive  results are indicative of active infection with SARS-CoV-2.  Clinical  correlation with patient history and other diagnostic information is  necessary to determine patient infection status.  Positive results do  not rule out bacterial infection or co-infection with other viruses. If result is PRESUMPTIVE POSTIVE SARS-CoV-2 nucleic acids MAY BE PRESENT.   A presumptive positive result was obtained on the submitted specimen  and confirmed on repeat testing.  While 2019 novel coronavirus  (SARS-CoV-2) nucleic acids may be present in the submitted sample  additional confirmatory testing may be necessary for epidemiological  and / or clinical  management purposes  to differentiate between  SARS-CoV-2 and other Sarbecovirus currently known to infect humans.  If clinically indicated additional testing with an alternate test  methodology [phone]) is advised. The SARS-CoV-2 RNA is generally  detectable in upper and lower respiratory sp ecimens during the acute  phase of infection. The expected result is Negative. Fact Sheet for Patients:  StrictlyIdeas.no Fact Sheet for Healthcare Providers: BankingDealers.co.za This test is not yet approved or cleared by the [place] FDA and has been authorized for detection and/or diagnosis of SARS-CoV-2 by FDA under an Emergency Use Authorization (EUA).  This EUA will remain in effect (meaning this test can be used) for the duration of the COVID-19 declaration under Section 564(b)(1) of the Act, 21 U.S.C. section 360bbb-3(b)(1), unless the authorization is terminated or revoked sooner. Performed at [facility], [address] [place], [place] [address]   MRSA PCR Screening     Status: None   Collection Time: 04/12/19  4:56 PM  Result Value Ref Range Status   MRSA by PCR NEGATIVE NEGATIVE Final    Comment:        The GeneXpert MRSA Assay (FDA approved for NASAL specimens only), is one component of a comprehensive MRSA colonization surveillance program. It is not intended to diagnose MRSA infection nor to guide or monitor treatment for MRSA infections. Performed at [facility], [address] [place], [place] [address]     Coagulation Studies: No results for input(s): LABPROT, INR in the last 72 hours.  Urinalysis: No results for input(s): COLORURINE, LABSPEC, PHURINE, GLUCOSEU, HGBUR, BILIRUBINUR, KETONESUR, PROTEINUR, UROBILINOGEN, NITRITE, LEUKOCYTESUR in the last 72 hours.  Invalid input(s): APPERANCEUR    Imaging: Ct Head Wo Contrast  Result Date: 04/13/2019 CLINICAL DATA:  Altered mental status  today. EXAM: CT HEAD WITHOUT CONTRAST TECHNIQUE: Contiguous axial images were obtained from the base of the skull through the vertex without intravenous contrast. COMPARISON:  None. FINDINGS: Brain: No evidence of acute infarction, hemorrhage, hydrocephalus, extra-axial collection or mass lesion/mass effect. Atrophy, chronic microvascular ischemic change remote lacunar infarction in the left basal ganglia noted. Vascular: No hyperdense vessel or unexpected calcification. Skull: Intact.  No focal lesion. Sinuses/Orbits: Short air-fluid level right maxillary sinus is seen. Other: None. IMPRESSION: No acute intracranial abnormality. Atrophy and chronic microvascular ischemic change. Mild right maxillary sinus disease. Electronically Signed   By: Inge Rise M.D.   On: 04/13/2019 17:09   US Venous Img Lower Unilateral Right  Result Date: 04/14/2019 CLINICAL DATA:  Right lower extremity edema for the past 2 days. Evaluate for DVT. EXAM: RIGHT LOWER EXTREMITY VENOUS DOPPLER ULTRASOUND TECHNIQUE: Gray-scale sonography with graded compression, as well as color Doppler and duplex ultrasound were performed to evaluate the lower extremity deep  venous systems from the level of the common femoral vein and including the common femoral, femoral, profunda femoral, popliteal and calf veins including the posterior tibial, peroneal and gastrocnemius veins when visible. The superficial great saphenous vein was also interrogated. Spectral Doppler was utilized to evaluate flow at rest and with distal augmentation maneuvers in the common femoral, femoral and popliteal veins. COMPARISON:  None. FINDINGS: Contralateral Common Femoral Vein: Respiratory phasicity is normal and symmetric with the symptomatic side. No evidence of thrombus. Normal compressibility. Common Femoral Vein: No evidence of thrombus. Dialysis catheter is seen within otherwise patent right common femoral vein. Normal compressibility and flow on color Doppler  imaging. Saphenofemoral Junction: No evidence of thrombus. Normal compressibility and flow on color Doppler imaging. Profunda Femoral Vein: No evidence of thrombus. Normal compressibility and flow on color Doppler imaging. Femoral Vein: No evidence of thrombus. Normal compressibility, respiratory phasicity and response to augmentation. Popliteal Vein: No evidence of thrombus. Normal compressibility, respiratory phasicity and response to augmentation. Calf Veins: No evidence of thrombus. Normal compressibility and flow on color Doppler imaging. Superficial Great Saphenous Vein: No evidence of thrombus. Normal compressibility. Venous Reflux:  None. Other Findings:  None. IMPRESSION: No evidence of DVT within right lower extremity. Specifically, the right common femoral vein which contains a portion of the femoral approach dialysis catheter appears patent. Electronically Signed   By: Sandi Mariscal M.D.   On: 04/14/2019 06:58   Dg Chest Port 1 View  Result Date: 04/14/2019 CLINICAL DATA:  Respiratory failure. EXAM: PORTABLE CHEST 1 VIEW COMPARISON:  Radiographs 04/12/2019 FINDINGS: Endotracheal and enteric tubes have been removed. Persistent but improving bilateral patchy opacities in both lungs. Mild cardiomegaly is similar. Suspect small pleural effusions. No pneumothorax. IMPRESSION: Persistent but improving bilateral patchy opacities, edema versus multifocal pneumonia. Suspect small pleural effusions. Electronically Signed   By: Keith Rake M.D.   On: 04/14/2019 02:51   Dg Chest Port 1 View  Result Date: 04/12/2019 CLINICAL DATA:  Shortness of breath.  Intubation. EXAM: PORTABLE CHEST 1 VIEW COMPARISON:  04/12/2019 FINDINGS: Again identified are diffuse hazy bilateral airspace opacities with significant interval worsening when compared to prior study. The endotracheal tube terminates above the carina by approximately 4.4 cm. The enteric tube extends below the left hemidiaphragm. There is no pneumothorax.  No large pleural effusion. The cardiac silhouette is mildly enlarged. IMPRESSION: 1. Lines and tubes as above. 2. Significant interval worsening of diffuse hazy bilateral airspace opacities which can be seen in patients with pulmonary edema or an atypical infectious process such as viral pneumonia. Electronically Signed   By: Constance Holster M.D.   On: 04/12/2019 18:45   Dg Chest Portable 1 View  Result Date: 04/12/2019 CLINICAL DATA:  Shortness of breath and swelling of the lower extremities, worsening recently. EXAM: PORTABLE CHEST 1 VIEW COMPARISON:  03/27/2019 FINDINGS: Central line has been removed. Mild cardiomegaly and aortic atherosclerosis as seen previously. Widespread pulmonary opacity, left more than right, could represent fluid overload/edema and/or pneumonia. There may be a small amount of pleural fluid on the left. IMPRESSION: Nonspecific widespread bilateral pulmonary density that could represent edema and/or pneumonia. Electronically Signed   By: Nelson Chimes M.D.   On: 04/12/2019 09:56   Dg Abd Portable 1v  Result Date: 04/12/2019 CLINICAL DATA:  Line placement EXAM: PORTABLE ABDOMEN - 1 VIEW COMPARISON:  None available FINDINGS: There is a dialysis catheter with tip projecting at the level of the L2 vertebral body. The enteric tube tip projects over the gastric  body. The bowel gas pattern is nonspecific and nonobstructive. IMPRESSION: 1. Nonobstructive bowel gas pattern. 2. Lines and tubes as above. Electronically Signed   By: Constance Holster M.D.   On: 04/12/2019 18:47     Medications:    . amLODipine  10 mg Oral Daily  . Chlorhexidine Gluconate Cloth  6 each Topical Q0600  . epoetin (EPOGEN/PROCRIT) injection  4,000 Units Intravenous Q M,W,F-HD  . furosemide  80 mg Intravenous BID  . heparin  5,000 Units Subcutaneous Q8H  . labetalol  100 mg Oral BID  . minoxidil  5 mg Oral Daily  . multivitamin with minerals  1 tablet Oral Daily   acetaminophen **OR**  acetaminophen, hydrALAZINE, nitroGLYCERIN, polyethylene glycol  Assessment/ Plan:  63 y.o. African-American male with hypertension, BPH, advanced CKD, was admitted on 03/27/2019 with uremia and acute pulmonary edema.   1.  End-stage renal disease 2.  Acute pulmonary edema/Acute respiratory failure 3.  Severe hypertension with CKD 4.  Lower extremity edema 5.  Hyperkalemia 6.  Anemia, likely of chronic kidney disease 7.  2D echo from September 2019 show moderate concentric left ventricular hypertrophy, LVEF 55 to 40%, grade 1 diastolic dysfunction, severely dilated left atrium  Started on HD 3rd HD treatment today Volume removal as tolerated, start iv lasix Venofer/EPO for Anemia of CKD and iron deficiency Outpatient placement at [facility] Possibly permcath tomorrow    LOS: [place] 5/14/20208:46 AM  [place], [place]  Note: This note was prepared with Dragon dictation. Any transcription errors are unintentional

## 2019-04-14 NOTE — Progress Notes (Signed)
[facility] [place] NAME: Devin Harmon    MR#:  [id number]  DATE OF BIRTH:  December 31, 1955  SUBJECTIVE:   Patient extubated. He wants to go home. Family in the room. Denies any chest pain or shortness of breath. Feels hungry. REVIEW OF SYSTEMS:   Review of Systems  Constitutional: Negative for chills, fever and weight loss.  HENT: Negative for ear discharge, ear pain and nosebleeds.   Eyes: Negative for blurred vision, pain and discharge.  Respiratory: Negative for sputum production, shortness of breath, wheezing and stridor.   Cardiovascular: Negative for chest pain, palpitations, orthopnea and PND.  Gastrointestinal: Negative for abdominal pain, diarrhea, nausea and vomiting.  Genitourinary: Negative for frequency and urgency.  Musculoskeletal: Negative for back pain and joint pain.  Neurological: Negative for sensory change, speech change, focal weakness and weakness.  Psychiatric/Behavioral: Negative for depression and hallucinations. The patient is not nervous/anxious.      DRUG ALLERGIES:   Allergies  Allergen Reactions  . Chlorthalidone     REACTION: felt "bad" and ED  . Hydrocodone Other (See Comments)    Nausea and dizzy  . Sulfonamide Derivatives Hives    REACTION: unspecified    VITALS:  Blood pressure (!) 158/80, pulse 73, temperature 98 F (36.7 C), temperature source Oral, resp. rate 18, height 5\' 8"  (1.727 m), weight 73.3 kg, SpO2 99 %.  PHYSICAL EXAMINATION:   Physical Exam  GENERAL:  63 y.o.-year-old patient lying in the bed with no acute distress. Critically ill EYES: Pupils equal, round, reactive to light and accommodation. No scleral icterus. Extraocular muscles intact.  HEENT: Head atraumatic, normocephalic. Oropharynx and nasopharynx clear.  NECK:  Supple, no jugular venous distention. No thyroid enlargement, no tenderness.  LUNGS: Normal breath sounds bilaterally, no wheezing, rales, rhonchi. No use  of accessory muscles of respiration.  CARDIOVASCULAR: S1, S2 normal. No murmurs, rubs, or gallops.  ABDOMEN: Soft, nontender, nondistended. Bowel sounds present. No organomegaly or mass.  EXTREMITIES: No cyanosis, clubbing or edema b/l.    NEUROLOGIC: grossly nonfocal. Moves all extremities well PSYCHIATRIC: alert and oriented times three SKIN: No obvious rash, lesion, or ulcer.   LABORATORY PANEL:  CBC Recent Labs  Lab 04/14/19 0442  WBC 8.6  HGB 7.8*  HCT 24.9*  PLT 266    Chemistries  Recent Labs  Lab 04/12/19 0926  04/14/19 0442  NA 138   < > 141  K 5.2*   < > 4.9  CL 105   < > 100  CO2 15*   < > 26  GLUCOSE 113*   < > 106*  BUN 115*   < > 68*  CREATININE 14.93*   < > 9.52*  CALCIUM 7.4*   < > 7.6*  AST 16  --   --   ALT 17  --   --   ALKPHOS 47  --   --   BILITOT 0.5  --   --    < > = values in this interval not displayed.   Cardiac Enzymes Recent Labs  Lab 04/12/19 0926  TROPONINI <0.03   RADIOLOGY:  Ct Head Wo Contrast  Result Date: 04/13/2019 CLINICAL DATA:  Altered mental status today. EXAM: CT HEAD WITHOUT CONTRAST TECHNIQUE: Contiguous axial images were obtained from the base of the skull through the vertex without intravenous contrast. COMPARISON:  None. FINDINGS: Brain: No evidence of acute infarction, hemorrhage, hydrocephalus, extra-axial collection or mass lesion/mass effect. Atrophy, chronic microvascular ischemic change  remote lacunar infarction in the left basal ganglia noted. Vascular: No hyperdense vessel or unexpected calcification. Skull: Intact.  No focal lesion. Sinuses/Orbits: Short air-fluid level right maxillary sinus is seen. Other: None. IMPRESSION: No acute intracranial abnormality. Atrophy and chronic microvascular ischemic change. Mild right maxillary sinus disease. Electronically Signed   By: Inge Rise M.D.   On: 04/13/2019 17:09   US Venous Img Lower Unilateral Right  Result Date: 04/14/2019 CLINICAL DATA:  Right lower  extremity edema for the past 2 days. Evaluate for DVT. EXAM: RIGHT LOWER EXTREMITY VENOUS DOPPLER ULTRASOUND TECHNIQUE: Gray-scale sonography with graded compression, as well as color Doppler and duplex ultrasound were performed to evaluate the lower extremity deep venous systems from the level of the common femoral vein and including the common femoral, femoral, profunda femoral, popliteal and calf veins including the posterior tibial, peroneal and gastrocnemius veins when visible. The superficial great saphenous vein was also interrogated. Spectral Doppler was utilized to evaluate flow at rest and with distal augmentation maneuvers in the common femoral, femoral and popliteal veins. COMPARISON:  None. FINDINGS: Contralateral Common Femoral Vein: Respiratory phasicity is normal and symmetric with the symptomatic side. No evidence of thrombus. Normal compressibility. Common Femoral Vein: No evidence of thrombus. Dialysis catheter is seen within otherwise patent right common femoral vein. Normal compressibility and flow on color Doppler imaging. Saphenofemoral Junction: No evidence of thrombus. Normal compressibility and flow on color Doppler imaging. Profunda Femoral Vein: No evidence of thrombus. Normal compressibility and flow on color Doppler imaging. Femoral Vein: No evidence of thrombus. Normal compressibility, respiratory phasicity and response to augmentation. Popliteal Vein: No evidence of thrombus. Normal compressibility, respiratory phasicity and response to augmentation. Calf Veins: No evidence of thrombus. Normal compressibility and flow on color Doppler imaging. Superficial Great Saphenous Vein: No evidence of thrombus. Normal compressibility. Venous Reflux:  None. Other Findings:  None. IMPRESSION: No evidence of DVT within right lower extremity. Specifically, the right common femoral vein which contains a portion of the femoral approach dialysis catheter appears patent. Electronically Signed   By:  Sandi Mariscal M.D.   On: 04/14/2019 06:58   Dg Chest Port 1 View  Result Date: 04/14/2019 CLINICAL DATA:  Respiratory failure. EXAM: PORTABLE CHEST 1 VIEW COMPARISON:  Radiographs 04/12/2019 FINDINGS: Endotracheal and enteric tubes have been removed. Persistent but improving bilateral patchy opacities in both lungs. Mild cardiomegaly is similar. Suspect small pleural effusions. No pneumothorax. IMPRESSION: Persistent but improving bilateral patchy opacities, edema versus multifocal pneumonia. Suspect small pleural effusions. Electronically Signed   By: Keith Rake M.D.   On: 04/14/2019 02:51   Dg Chest Port 1 View  Result Date: 04/12/2019 CLINICAL DATA:  Shortness of breath.  Intubation. EXAM: PORTABLE CHEST 1 VIEW COMPARISON:  04/12/2019 FINDINGS: Again identified are diffuse hazy bilateral airspace opacities with significant interval worsening when compared to prior study. The endotracheal tube terminates above the carina by approximately 4.4 cm. The enteric tube extends below the left hemidiaphragm. There is no pneumothorax. No large pleural effusion. The cardiac silhouette is mildly enlarged. IMPRESSION: 1. Lines and tubes as above. 2. Significant interval worsening of diffuse hazy bilateral airspace opacities which can be seen in patients with pulmonary edema or an atypical infectious process such as viral pneumonia. Electronically Signed   By: Constance Holster M.D.   On: 04/12/2019 18:45   Dg Abd Portable 1v  Result Date: 04/12/2019 CLINICAL DATA:  Line placement EXAM: PORTABLE ABDOMEN - 1 VIEW COMPARISON:  None available FINDINGS: There is a  dialysis catheter with tip projecting at the level of the L2 vertebral body. The enteric tube tip projects over the gastric body. The bowel gas pattern is nonspecific and nonobstructive. IMPRESSION: 1. Nonobstructive bowel gas pattern. 2. Lines and tubes as above. Electronically Signed   By: Constance Holster M.D.   On: 04/12/2019 18:47   ASSESSMENT  AND PLAN:   Devin Harmon  is a 63 y.o. male with a known history of end-stage renal disease was started on dialysis in April with similar symptoms of acute on chronic shortness of breath. Patient refused dialysis at prior to discharge was placed on hospice and discharged to home. He became short of breath again now presents with worsening short of breath and lower extremity edema.   1. Acute hypoxic respiratory failure secondary to severe pulmonary edema in the setting of end-stage renal disease--  intubated and on the ventilator-- patient now extubated -patient had declined dialysis when he was discharged home with hospice in April 2020. -Patient was seen by Dr. Candiss Norse.  -Patient currently getting dialyzed with significant amount of ultrafiltration. His oxygen saturations improved.  2. End-stage renal disease on hemodialysis -patient now agreeable for dialysis. -Education officer, museum for outpatient dialysis chair time -permanent catheter To be placed hopefully in 1 to 2 days  3. Malignant hypertension -continue home meds. PRN IV hydralazine  4. Anemia of chronic disease in the setting of end-stage renal disease -per Dr. Candiss Norse  5. DVT prophylaxis subcu heparin  Case discussed with Care Management/Social Worker.  CODE STATUS: full  DVT Prophylaxis:heparin  TOTAL TIME TAKING CARE OF THIS PATIENT: *25* minutes.  >50% time spent on counselling and coordination of care  POSSIBLE D/C IN *few* DAYS, DEPENDING ON CLINICAL CONDITION.  Note: This dictation was prepared with Dragon dictation along with smaller phrase technology. Any transcriptional errors that result from this process are unintentional.  Fritzi Mandes M.D on 04/14/2019 at 12:50 PM  Between 7am to 6pm - Pager - [phone]  After 6pm go to [email or web address] - password [email or web address] [facility]  Office  [phone]  CC: Primary care physician; Venia Carbon, MDPatient ID: Devin Harmon, male   DOB:  Mar 23, 1956, 63 y.o.   MRN: [id number]

## 2019-04-14 NOTE — Progress Notes (Signed)
Has tolerated extubation well.  Comfortable on [place] O2  Vitals:   04/14/19 0700 04/14/19 0800 04/14/19 1000 04/14/19 1100  BP: (!) 157/89 (!) 169/89 (!) 162/97 (!) 172/91  Pulse: 62 73 82 86  Resp: (!) 9 10 17 16   Temp:  98.5 F (36.9 C)    TempSrc:  Oral    SpO2: 99% 99% 97% 96%  Weight:      Height:      2 LPM [place]  Gen: NAD HEENT: NCAT, sclerae white Neck: + JVD Lungs: L >R bibasilar crackles Cardiovascular: RRR, no murmurs Abdomen: Soft, nontender, normal BS Ext: R >L LE edema Neuro: grossly intact Skin: Limited exam, no lesions noted  BMP Latest Ref Rng & Units 04/14/2019 04/13/2019 04/12/2019  Glucose 70 - 99 mg/dL 106(H) 89 113(H)  BUN 8 - 23 mg/dL 68(H) 95(H) 115(H)  Creatinine 0.61 - 1.24 mg/dL 9.52(H) 12.33(H) 14.93(H)  Sodium 135 - 145 mmol/L 141 140 138  Potassium 3.5 - 5.1 mmol/L 4.9 5.6(H) 5.2(H)  Chloride 98 - 111 mmol/L 100 103 105  CO2 22 - 32 mmol/L 26 23 15(L)  Calcium 8.9 - 10.3 mg/dL 7.6(L) 7.3(L) 7.4(L)   CBC Latest Ref Rng & Units 04/14/2019 04/13/2019 04/12/2019  WBC 4.0 - 10.5 K/uL 8.6 8.4 7.8  Hemoglobin 13.0 - 17.0 g/dL 7.8(L) 7.1(L) 7.8(L)  Hematocrit 39.0 - 52.0 % 24.9(L) 22.7(L) 24.4(L)  Platelets 150 - 400 K/uL 266 280 287   CXR: Cardiomegaly, improved edema pattern  IMPRESSION: Acute hypoxemic/hypercarbic respiratory failure Acute pulmonary edema due to volume overload ESRD Asymmetric lower extremity edema  No DVT on LE venous ultrasound  PLAN/REC: Continue supplemental oxygen as needed Further dialysis planned today Transfer to MedSurg floor and PCCM sign off  Merton Border, MD PCCM service Mobile [phone] Pager [phone] 04/14/2019 11:45 AM

## 2019-04-15 ENCOUNTER — Encounter
Admission: EM | Disposition: A | Discharge status: Home / Self Care | Payer: Self-pay | Source: Home / Self Care | Attending: Internal Medicine

## 2019-04-15 ENCOUNTER — Encounter: Payer: Self-pay | Admitting: Vascular Surgery

## 2019-04-15 DIAGNOSIS — N179 Acute kidney failure, unspecified: Secondary | ICD-10-CM

## 2019-04-15 DIAGNOSIS — I509 Heart failure, unspecified: Secondary | ICD-10-CM

## 2019-04-15 HISTORY — PX: DIALYSIS/PERMA CATHETER INSERTION: CATH118288

## 2019-04-15 LAB — QUANTIFERON-TB GOLD PLUS: QuantiFERON-TB Gold Plus: NEGATIVE

## 2019-04-15 LAB — QUANTIFERON-TB GOLD PLUS (RQFGPL)
QuantiFERON Mitogen Value: 2.28 IU/mL
QuantiFERON Nil Value: 0.01 IU/mL
QuantiFERON TB1 Ag Value: 0.01 IU/mL
QuantiFERON TB2 Ag Value: 0.01 IU/mL

## 2019-04-15 SURGERY — DIALYSIS/PERMA CATHETER INSERTION
Anesthesia: Moderate Sedation

## 2019-04-15 MED ORDER — TORSEMIDE 20 MG PO TABS
40.0000 mg | ORAL_TABLET | Freq: Every day | ORAL | Status: DC
  Administered 2019-04-15 – 2019-04-17 (×3): 40 mg via ORAL
  Filled 2019-04-15 (×4): qty 2

## 2019-04-15 MED ORDER — CEFAZOLIN SODIUM-DEXTROSE 2-4 GM/100ML-% IV SOLN
2.0000 g | INTRAVENOUS | Status: AC
  Administered 2019-04-15: 2 g via INTRAVENOUS
  Filled 2019-04-15: qty 100

## 2019-04-15 MED ORDER — HEPARIN SODIUM (PORCINE) 10000 UNIT/ML IJ SOLN
INTRAMUSCULAR | Status: AC
  Administered 2019-04-15: 09:00:00
  Filled 2019-04-15: qty 1

## 2019-04-15 MED ORDER — HEPARIN SODIUM (PORCINE) 1000 UNIT/ML IJ SOLN
INTRAMUSCULAR | Status: AC
  Filled 2019-04-15: qty 1

## 2019-04-15 MED ORDER — MIDAZOLAM HCL 2 MG/2ML IJ SOLN
INTRAMUSCULAR | Status: DC | PRN
  Administered 2019-04-15: 1 mg via INTRAVENOUS

## 2019-04-15 MED ORDER — FENTANYL CITRATE (PF) 100 MCG/2ML IJ SOLN
INTRAMUSCULAR | Status: DC | PRN
  Administered 2019-04-15: 50 ug via INTRAVENOUS

## 2019-04-15 MED ORDER — FENTANYL CITRATE (PF) 100 MCG/2ML IJ SOLN
INTRAMUSCULAR | Status: AC
  Administered 2019-04-15: 11:00:00
  Filled 2019-04-15: qty 2

## 2019-04-15 MED ORDER — LORAZEPAM 2 MG/ML IJ SOLN
1.0000 mg | Freq: Once | INTRAMUSCULAR | Status: AC
  Administered 2019-04-15: 1 mg via INTRAMUSCULAR
  Filled 2019-04-15: qty 1

## 2019-04-15 MED ORDER — LIDOCAINE-EPINEPHRINE (PF) 1 %-1:200000 IJ SOLN
INTRAMUSCULAR | Status: AC
  Administered 2019-04-15: 09:00:00
  Filled 2019-04-15: qty 30

## 2019-04-15 MED ORDER — MIDAZOLAM HCL 5 MG/5ML IJ SOLN
INTRAMUSCULAR | Status: AC
  Administered 2019-04-15: 09:00:00
  Filled 2019-04-15: qty 5

## 2019-04-15 SURGICAL SUPPLY — 15 items
BIOPATCH RED 1 DISK 7.0 (GAUZE/BANDAGES/DRESSINGS) ×1 IMPLANT
BIOPATCH RED 1IN DISK 7.0MM (GAUZE/BANDAGES/DRESSINGS) ×1
CATH PALINDROME RT-P 15FX19CM (CATHETERS) ×2 IMPLANT
COVER PROBE U/S 5X48 (MISCELLANEOUS) ×2 IMPLANT
DRAPE INCISE IOBAN 66X45 STRL (DRAPES) ×2 IMPLANT
NDL ENTRY 21GA 7CM ECHOTIP (NEEDLE) IMPLANT
NEEDLE ENTRY 21GA 7CM ECHOTIP (NEEDLE) ×3 IMPLANT
PACK ANGIOGRAPHY (CUSTOM PROCEDURE TRAY) ×2 IMPLANT
SET INTRO CAPELLA COAXIAL (SET/KITS/TRAYS/PACK) ×2 IMPLANT
SUT MNCRL 4-0 (SUTURE) ×2
SUT MNCRL 4-0 27XMFL (SUTURE) ×1
SUT PROLENE 0 CT 1 30 (SUTURE) ×2 IMPLANT
SUTURE MNCRL 4-0 27XMF (SUTURE) IMPLANT
TOWEL OR 17X26 4PK STRL BLUE (TOWEL DISPOSABLE) ×2 IMPLANT
WIRE NITINOL .018 (WIRE) ×2 IMPLANT

## 2019-04-15 NOTE — Progress Notes (Addendum)
Per RN Serenity she talked to MD about patient's trialysis catheter to be discontinue. Removed right groin trialysis catheter, held 15 minutes for hemostasis, no bleeding noted. Petroleum gauze, 4x4 gauze and silk tape dressing in place. RN will continue to monitor.

## 2019-04-15 NOTE — Progress Notes (Signed)
Spoke with pt. Wife to update her on pt. Status post [facility] Cath placement. Report given to Serenity, RN on 2A floor. Pt. In no acute distress at present. Phoned dialysis-"not ready for pt." at present.

## 2019-04-15 NOTE — Progress Notes (Signed)
Pt continues to become agitated, not wanting to stay in the bed, pulling at foley. Attempted to redirect , but not able to redirect at the time. Notified MD, orders placed. Tele sitter ordered. Will continue to monitor and assess.

## 2019-04-15 NOTE — Progress Notes (Signed)
Following this patient for Hemodialysis outpatient placement. Per wife and patient,  [name] is preferred as well as the MWF 3rd shift. Clinic is aware, waiting on insurance verification.

## 2019-04-15 NOTE — Plan of Care (Signed)
  Problem: Safety: Goal: Ability to remain free from injury will improve Outcome: Not Progressing  Pt attempting to get out of bed throughout the evening. Not easily redirected at times. Medications ordered. Telesitter ordered Problem: Skin Integrity: Goal: Risk for impaired skin integrity will decrease Outcome: Not Progressing  Attempting to pull foley out

## 2019-04-15 NOTE — Progress Notes (Signed)
Spoke with wife to let her know the tunneled catheter is in and the procedure went fine

## 2019-04-15 NOTE — Op Note (Signed)
OPERATIVE NOTE   PROCEDURE: 1. Insertion of tunneled dialysis catheter right IJ approach with ultrasound and fluoroscopic guidance.  PRE-OPERATIVE DIAGNOSIS: Acute renal failure  POST-OPERATIVE DIAGNOSIS: Same  SURGEON: Hortencia Pilar.  ANESTHESIA: Conscious sedation was administered under my direct supervision by the interventional radiology RN. IV Versed plus fentanyl were utilized. Continuous ECG, pulse oximetry and blood pressure was monitored throughout the entire procedure. Conscious sedation was for a total of 30 minutes.  ESTIMATED BLOOD LOSS: Minimal cc  CONTRAST USED:  None  FLUOROSCOPY TIME: 0.8 minutes  INDICATIONS:   Devin Harmon a 64 y.o. y.o. male who presents with worsening of his renal function.  He presented with congestive heart failure.  His renal status is deteriorated to the point that he has initiated hemodialysis.  He is therefore undergoing placement of a tunneled catheter in preparation for discharge and continued dialysis as an outpatient.  The risks and benefits of been reviewed all questions answered patient wife agrees for [place] to proceed.  DESCRIPTION: After obtaining full informed written consent, the patient was positioned supine. The right neck and chest wall was prepped and draped in a sterile fashion. Ultrasound was placed in a sterile sleeve. Ultrasound was utilized to identify the right internal jugular vein which is noted to be echolucent and compressible indicating patency. Image is recorded for the permanent record. Under direct ultrasound visualization a micro-needle is inserted into the vein followed by the micro-wire. Micro-sheath was then advanced and a J wire is inserted without difficulty under fluoroscopic guidance. Small counterincision was made at the wire insertion site. Dilators are passed over the wire and the tunneled dialysis catheter is fed into the central venous system without difficulty.  Under fluoroscopy the catheter tip  positioned at the atrial caval junction. The catheter is then approximated to the right chest wall and an exit site selected. 1% lidocaine is infiltrated in soft tissues at this level small incision is made and the tunneling device is then passed from the exit site to the right neck counterincision. Catheter is then connected to the tunneling device and the catheter was pulled subcutaneously. It is then transected and the hub assembly connected without difficulty. Both lumens aspirate and flush easily. After verification of smooth contour with proper tip position under fluoroscopy the catheter is packed with 5000 units of heparin per lumen.  Catheter secured to the skin of the right chest wall with 0 silk. A sterile dressing is applied with a Biopatch.  COMPLICATIONS: None  CONDITION: Good  Hortencia Pilar [facility]. Office:  [phone]   04/15/2019,9:31 AM

## 2019-04-15 NOTE — Discharge Instructions (Signed)
Eating Plan for Dialysis Dialysis is a treatment that cleans your blood. It is used when your kidneys are damaged. When you need dialysis, you should watch what you eat. This is because some nutrients can build up in your blood between treatments and make you sick. Your doctor or diet specialist (dietitian) will:  Tell you what nutrients you should include or avoid.  Tell you how much of these nutrients you should get each day.  Help you plan meals.  Tell you how much to drink each day. What are tips for following this plan? Reading food labels  Check food labels for: ? Potassium. This is found in milk, fruits, and vegetables. ? Phosphorus. This is found in milk, cheese, beans, nuts, and carbonated beverages. ? Salt (sodium). This is in processed meats, cured meats, ready-made frozen meals, canned vegetables, and salty snack foods.  Try to find foods that are low in potassium, phosphorus, and sodium.  Look for foods that are labeled "sodium free," "reduced sodium," or "low sodium." Shopping  Do not buy whole-grain and high-fiber foods.  Do not buy or use salt substitutes.  Do not buy processed foods. Cooking  Drain all fluid from cooked vegetables and canned fruits before you eat them.  Before you cook potatoes, cut them into small pieces. Then boil them in unsalted water.  Try using herbs and spices that do not contain sodium to add flavor. Meal planning Most people on dialysis should try to eat:  6-11 servings of grains each day. One serving is equal to 1 slice of bread or  cup of cooked rice or pasta.  2-3 servings of low-potassium vegetables each day. One serving is equal to  cup.  2-3 servings of low-potassium fruits each day. One serving is equal to  cup.  Protein, such as meat, poultry, fish, and eggs. Talk with your doctor or dietitian about the right amount and type of protein to eat.   cup of dairy each day. General information  Follow your doctor's  instructions about how much to drink. You may be told to: ? Write down what you drink. ? Write down the foods you eat that are made mostly from water, such as gelatin and soups. ? Drink from small cups.  Take vitamin and mineral supplements only as told by your doctor.  Take over-the-counter and prescription medicines only as told by your doctor. What foods can I eat?     Fruits Apples. Fresh or frozen berries. Fresh or canned pears, peaches, and pineapple. Grapes. Plums. Vegetables Fresh or frozen broccoli, carrots, and green beans. Cabbage. Cauliflower. Celery. Cucumbers. Eggplant. Radishes. Zucchini. Grains White bread. White rice. Cooked cereal. Unsalted popcorn. Tortillas. Pasta. Meats and other proteins Fresh or frozen beef, pork, chicken, and fish. Eggs. Dairy Cream cheese. Heavy cream. Ricotta cheese. Beverages Apple cider. Cranberry juice. Grape juice. Lemonade. Black coffee. Rice milk (that is not enriched or fortified). Seasonings and condiments Herbs. Spices. Jam and jelly. Honey. Sweets and desserts Sherbet. Cakes. Cookies. Fats and oils Olive oil, canola oil, and safflower oil. Other foods Non-dairy creamer. Non-dairy whipped topping. Homemade broth without salt. The items listed above may not be a complete list of foods and beverages you can eat. Contact your dietitian for more options. What foods should I avoid? Fruits Star fruit. Bananas. Oranges. Kiwi. Nectarines. Prunes. Melon. Dried fruit. Avocado. Vegetables Potatoes. Beets. Tomatoes. Winter squash and pumpkin. Asparagus. Spinach. Parsnips. Grains Whole-grain bread. Whole-grain pasta. High-fiber cereal. Meats and other proteins Canned, smoked, and  cured meats. Packaged lunch meat. Sardines. Nuts and seeds. Peanut butter. Beans and legumes. Dairy Milk. Buttermilk. Yogurt. Cheese and cottage cheese. Processed cheese spreads. Beverages Orange juice. Prune juice. Carbonated soft drinks. Seasonings and  condiments Salt. Salt substitutes. Soy sauce. Sweets and desserts Ice cream. Chocolate. Candied nuts. Fats and oils Butter. Margarine. Other foods Ready-made frozen meals. Canned soups. The items listed above may not be a complete list of foods and beverages you should avoid. Contact your dietitian for more information. Summary  If you are having dialysis, it is important to watch what you eat. Certain nutrients and wastes can build up in your blood and cause you to get sick.  Your dietitian will help you make an eating plan that meets your needs.  Avoid foods that are high in potassium, salt (sodium), and phosphorus. Restrict fluids as told by your doctor or dietitian. This information is not intended to replace advice given to you by your health care provider. Make sure you discuss any questions you have with your health care provider. Document Released: 05/18/2012 Document Revised: 02/03/2018 Document Reviewed: 11/18/2017 Elsevier Interactive Patient Education  2019 [employer].   Dialysis Dialysis is a procedure that is done when the kidneys have stopped working properly (kidney failure). It may also be done earlier if it may help improve symptoms. During dialysis, wastes, salt, and extra water are removed from the blood, and the levels of certain minerals in the blood are maintained. Dialysis is done in sessions which are continued until the kidneys get better. If the kidneys cannot get better, such as in end-stage kidney disease, dialysis is continued for life or until you receive a new kidney from a donor (kidney transplant). There are two types of dialysis: hemodialysis and peritoneal dialysis. What is hemodialysis?        Hemodialysis is when a machine called a dialyzer is used to filter the blood. Before starting hemodialysis, you will have surgery to create a site where blood can be removed from the body and returned to the body (vascular access). There are three types of  vascular accesses:  Arteriovenous fistula. This type of access is created when an artery and a vein (usually in the arm) are connected during surgery. The arteriovenous fistula usually takes 1-6 months to develop after surgery. It may last longer than the other types of vascular accesses and is less likely to become infected or cause blood clots.  Arteriovenous graft. This type of access is created when an artery and a vein in the arm are connected during surgery with a tube. An arteriovenous graft can usually be used within 2-3 weeks of surgery.  A venous catheter. To create this type of access, a thin tube (catheter) is placed in a large vein in your neck, chest, or groin. A venous catheter can be used right away. It is usually used as a temporary access when dialysis needs to begin immediately. During hemodialysis, blood leaves your body through your access site. It travels through a tube to the dialyzer, where it is filtered. The blood then returns to your body through another tube. Hemodialysis is usually done at a hospital or dialysis center three times a week. Visits last about 3-5 hours. With special training, it may also be done at home with the help of another person. What is peritoneal dialysis? Peritoneal dialysis is when the thin lining of the abdomen (peritoneum) and a fluid called dialysate are used to filter the blood. Before starting peritoneal dialysis, you  will have surgery to place a catheter in your abdomen. The catheter will be used to transfer dialysate to and from your abdomen. At the start of a session, your abdomen is filled with dialysate. During the session, wastes, salt, and extra water in the blood pass through the peritoneum and into the dialysate. The dialysate is drained from the body at the end of the session. The process of filling and draining the dialysate is called an exchange. Exchanges are repeated until you have used up all the dialysate for the day. You may do  peritoneal dialysis at home or at almost any other location. It is done every day. You may need up to five exchanges a day. Each exchange takes about 30-40 minutes. The amount of time the dialysate is in your body between exchanges is called a dwell. The dwell usually lasts 1.5-3 hours and can vary with each person. You may choose to do exchanges at night while you sleep, using a machine called a cycler. Which type of dialysis should I choose? Both types of dialysis have advantages and disadvantages. Talk with your health care provider about which type of dialysis is best for you. Your lifestyle, preferences, and medical condition should be considered. In some cases, only one type of dialysis can be chosen. Advantages of hemodialysis  It is done less often than peritoneal dialysis.  Someone else can do the dialysis for you.  If you go to a dialysis center: ? Your health care provider can recognize any problems you may be having. ? You can interact with others who are having dialysis. This can provide you with emotional support. Disadvantages of hemodialysis  Hemodialysis may cause cramps and low blood pressure. It may leave you feeling tired on the days you have the treatment.  If you go to a dialysis center, you will need to make weekly appointments and work around the centers schedule.  You will need to take extra care when traveling. If you usually get treatment in a dialysis center, you will need to arrange to visit a dialysis center near your destination. If you are having treatments at home, you will need to take the dialyzer with you when traveling.  There are more eating restrictions than with peritoneal dialysis. Advantages of peritoneal dialysis  It is less likely than hemodialysis to cause cramps and low blood pressure.  There are fewer eating restrictions than with hemodialysis.  You may do exchanges on your own wherever you are, including when you travel. Disadvantages of  peritoneal dialysis  It is done more often than hemodialysis.  Doing peritoneal dialysis requires you to have a good use (dexterity) of your hands. You must also be able to lift bags.  You must learn how to make your equipment free of germs (sterilization techniques). You will need to use these techniques every day to prevent infection. What changes will I need to make to my diet during dialysis? Both types of dialysis require you to make some changes to your diet. For example, you will need to limit your intake of foods that contain a lot of phosphorus and potassium. You will also need to limit your fluid intake. A diet and nutrition specialist (dietitian) can help you make a meal plan that can help improve your dialysis and your health. What should I expect when starting dialysis? Adjusting to the dialysis treatment, schedule, and diet can take some time. You may need to stop working and may not be able to do some of  your normal activities. You may feel anxious or depressed when starting dialysis. Over time, many people feel better overall because of dialysis. You may be able to return to work after making some changes, such as reducing work intensity. Where to find more information  [place]: www.kidney.org  American Association of Kidney Patients: BombTimer.gl  [employer]: www.kidneyfund.org Summary  During dialysis, wastes, salt, and extra water are removed from the blood, and the levels of certain minerals in the blood are maintained. There are two types of dialysis: hemodialysis and peritoneal dialysis.  Hemodialysis is when a machine called a dialyzer is used to filter the blood.  Hemodialysis is usually done by a health care provider at a hospital or dialysis center three times a week.  Peritoneal dialysis is when the peritoneum is used as a filter. You may do peritoneal dialysis at home or at almost any other location.  Both types of dialysis have  advantages and disadvantages. Talk with your health care provider about which type of dialysis is best for you. This information is not intended to replace advice given to you by your health care provider. Make sure you discuss any questions you have with your health care provider. Document Released: 02/07/2003 Document Revised: 01/13/2017 Document Reviewed: 01/13/2017 Elsevier Interactive Patient Education  2019 [employer].

## 2019-04-15 NOTE — Progress Notes (Signed)
[facility], [place] 04/15/19  Subjective:   Patient is intermittently confused, was agitated overnight Had Tunneled dialysis catheter placed Per nursing report patient pulled out his foley cathter resulting in some trauma   Objective:  Vital signs in last 24 hours:  Temp:  [98 F (36.7 C)-98.8 F (37.1 C)] 98.4 F (36.9 C) (05/15 1240) Pulse Rate:  [66-94] 94 (05/15 1240) Resp:  [13-27] 19 (05/15 1240) BP: (129-186)/(68-90) 165/88 (05/15 1240) SpO2:  [94 %-100 %] 96 % (05/15 1240)  Weight change: -1.564 kg Filed Weights   04/13/19 0845 04/13/19 1141 04/14/19 1157  Weight: 75.5 kg 73.3 kg 73.9 kg    Intake/Output:    Intake/Output Summary (Last 24 hours) at 04/15/2019 1618 Last data filed at 04/15/2019 1300 Gross per 24 hour  Intake -  Output 2950 ml  Net -2950 ml     Physical Exam: General:  ill-appearing, laying in the bed  HEENT  moist oral mucus membranes  Neck  distended neck veins  Pulm/lungs  mild  diffuse b/l crackles  CVS/Heart   regular  Abdomen:   Soft, mildly distended  Extremities:  + pitting edema  Neurologic:  delirius  Skin:  No acute distress  Access:  permcath 04/15/19       Basic Metabolic Panel:  Recent Labs  Lab 04/12/19 0926 04/12/19 1936 04/13/19 0450 04/14/19 0442  NA 138  --  140 141  K 5.2*  --  5.6* 4.9  CL 105  --  103 100  CO2 15*  --  23 26  GLUCOSE 113*  --  89 106*  BUN 115*  --  95* 68*  CREATININE 14.93*  --  12.33* 9.52*  CALCIUM 7.4*  --  7.3* 7.6*  PHOS  --  5.5*  --   --      CBC: Recent Labs  Lab 04/12/19 0926 04/13/19 0450 04/14/19 0442  WBC 7.8 8.4 8.6  NEUTROABS 6.2  --   --   HGB 7.8* 7.1* 7.8*  HCT 24.4* 22.7* 24.9*  MCV 85.0 86.6 87.7  PLT 287 280 266      Lab Results  Component Value Date   HEPBSAG Negative 03/27/2019   HEPBSAB Non Reactive 03/27/2019      Microbiology:  Recent Results (from the past 240 hour(s))  SARS Coronavirus 2 (CEPHEID-  Performed in [place] hospital lab), Hosp Order     Status: None   Collection Time: 04/12/19  9:26 AM  Result Value Ref Range Status   SARS Coronavirus 2 NEGATIVE NEGATIVE Final    Comment: (NOTE) If result is NEGATIVE SARS-CoV-2 target nucleic acids are NOT DETECTED. The SARS-CoV-2 RNA is generally detectable in upper and lower  respiratory specimens during the acute phase of infection. The lowest  concentration of SARS-CoV-2 viral copies this assay can detect is 250  copies / mL. A negative result does not preclude SARS-CoV-2 infection  and should not be used as the sole basis for treatment or other  patient management decisions.  A negative result may occur with  improper specimen collection / handling, submission of specimen other  than nasopharyngeal swab, presence of viral mutation(s) within the  areas targeted by this assay, and inadequate number of viral copies  (<250 copies / mL). A negative result must be combined with clinical  observations, patient history, and epidemiological information. If result is POSITIVE SARS-CoV-2 target nucleic acids are DETECTED. The SARS-CoV-2 RNA is generally detectable in upper and lower  respiratory  specimens dur ing the acute phase of infection.  Positive  results are indicative of active infection with SARS-CoV-2.  Clinical  correlation with patient history and other diagnostic information is  necessary to determine patient infection status.  Positive results do  not rule out bacterial infection or co-infection with other viruses. If result is PRESUMPTIVE POSTIVE SARS-CoV-2 nucleic acids MAY BE PRESENT.   A presumptive positive result was obtained on the submitted specimen  and confirmed on repeat testing.  While 2019 novel coronavirus  (SARS-CoV-2) nucleic acids may be present in the submitted sample  additional confirmatory testing may be necessary for epidemiological  and / or clinical management purposes  to differentiate between   SARS-CoV-2 and other Sarbecovirus currently known to infect humans.  If clinically indicated additional testing with an alternate test  methodology [phone]) is advised. The SARS-CoV-2 RNA is generally  detectable in upper and lower respiratory sp ecimens during the acute  phase of infection. The expected result is Negative. Fact Sheet for Patients:  StrictlyIdeas.no Fact Sheet for Healthcare Providers: BankingDealers.co.za This test is not yet approved or cleared by the [place] FDA and has been authorized for detection and/or diagnosis of SARS-CoV-2 by FDA under an Emergency Use Authorization (EUA).  This EUA will remain in effect (meaning this test can be used) for the duration of the COVID-19 declaration under Section 564(b)(1) of the Act, 21 U.S.C. section 360bbb-3(b)(1), unless the authorization is terminated or revoked sooner. Performed at [facility], [address] [place], [place] [address]   MRSA PCR Screening     Status: None   Collection Time: 04/12/19  4:56 PM  Result Value Ref Range Status   MRSA by PCR NEGATIVE NEGATIVE Final    Comment:        The GeneXpert MRSA Assay (FDA approved for NASAL specimens only), is one component of a comprehensive MRSA colonization surveillance program. It is not intended to diagnose MRSA infection nor to guide or monitor treatment for MRSA infections. Performed at [facility], [address] [place], [place] [address]     Coagulation Studies: No results for input(s): LABPROT, INR in the last 72 hours.  Urinalysis: No results for input(s): COLORURINE, LABSPEC, PHURINE, GLUCOSEU, HGBUR, BILIRUBINUR, KETONESUR, PROTEINUR, UROBILINOGEN, NITRITE, LEUKOCYTESUR in the last 72 hours.  Invalid input(s): APPERANCEUR    Imaging: Ct Head Wo Contrast  Result Date: 04/13/2019 CLINICAL DATA:  Altered mental status today. EXAM: CT HEAD WITHOUT CONTRAST  TECHNIQUE: Contiguous axial images were obtained from the base of the skull through the vertex without intravenous contrast. COMPARISON:  None. FINDINGS: Brain: No evidence of acute infarction, hemorrhage, hydrocephalus, extra-axial collection or mass lesion/mass effect. Atrophy, chronic microvascular ischemic change remote lacunar infarction in the left basal ganglia noted. Vascular: No hyperdense vessel or unexpected calcification. Skull: Intact.  No focal lesion. Sinuses/Orbits: Short air-fluid level right maxillary sinus is seen. Other: None. IMPRESSION: No acute intracranial abnormality. Atrophy and chronic microvascular ischemic change. Mild right maxillary sinus disease. Electronically Signed   By: Inge Rise M.D.   On: 04/13/2019 17:09   US Venous Img Lower Unilateral Right  Result Date: 04/14/2019 CLINICAL DATA:  Right lower extremity edema for the past 2 days. Evaluate for DVT. EXAM: RIGHT LOWER EXTREMITY VENOUS DOPPLER ULTRASOUND TECHNIQUE: Gray-scale sonography with graded compression, as well as color Doppler and duplex ultrasound were performed to evaluate the lower extremity deep venous systems from the level of the common femoral vein and including the common femoral, femoral,  profunda femoral, popliteal and calf veins including the posterior tibial, peroneal and gastrocnemius veins when visible. The superficial great saphenous vein was also interrogated. Spectral Doppler was utilized to evaluate flow at rest and with distal augmentation maneuvers in the common femoral, femoral and popliteal veins. COMPARISON:  None. FINDINGS: Contralateral Common Femoral Vein: Respiratory phasicity is normal and symmetric with the symptomatic side. No evidence of thrombus. Normal compressibility. Common Femoral Vein: No evidence of thrombus. Dialysis catheter is seen within otherwise patent right common femoral vein. Normal compressibility and flow on color Doppler imaging. Saphenofemoral Junction: No  evidence of thrombus. Normal compressibility and flow on color Doppler imaging. Profunda Femoral Vein: No evidence of thrombus. Normal compressibility and flow on color Doppler imaging. Femoral Vein: No evidence of thrombus. Normal compressibility, respiratory phasicity and response to augmentation. Popliteal Vein: No evidence of thrombus. Normal compressibility, respiratory phasicity and response to augmentation. Calf Veins: No evidence of thrombus. Normal compressibility and flow on color Doppler imaging. Superficial Great Saphenous Vein: No evidence of thrombus. Normal compressibility. Venous Reflux:  None. Other Findings:  None. IMPRESSION: No evidence of DVT within right lower extremity. Specifically, the right common femoral vein which contains a portion of the femoral approach dialysis catheter appears patent. Electronically Signed   By: Sandi Mariscal M.D.   On: 04/14/2019 06:58   Dg Chest Port 1 View  Result Date: 04/14/2019 CLINICAL DATA:  Respiratory failure. EXAM: PORTABLE CHEST 1 VIEW COMPARISON:  Radiographs 04/12/2019 FINDINGS: Endotracheal and enteric tubes have been removed. Persistent but improving bilateral patchy opacities in both lungs. Mild cardiomegaly is similar. Suspect small pleural effusions. No pneumothorax. IMPRESSION: Persistent but improving bilateral patchy opacities, edema versus multifocal pneumonia. Suspect small pleural effusions. Electronically Signed   By: Keith Rake M.D.   On: 04/14/2019 02:51     Medications:    . amLODipine  10 mg Oral Daily  . Chlorhexidine Gluconate Cloth  6 each Topical Q0600  . epoetin (EPOGEN/PROCRIT) injection  4,000 Units Intravenous Q T,Th,Sa-HD  . heparin  5,000 Units Subcutaneous Q8H  . labetalol  100 mg Oral BID  . minoxidil  5 mg Oral Daily  . multivitamin with minerals  1 tablet Oral Daily  . torsemide  40 mg Oral Daily   haloperidol lactate, hydrALAZINE, nitroGLYCERIN, polyethylene glycol  Assessment/ Plan:  63 y.o.  African-American male with hypertension, BPH, advanced CKD, was admitted on 03/27/2019 with uremia and acute pulmonary edema.   1.  End-stage renal disease 2.  Acute pulmonary edema/Acute respiratory failure 3.  Severe hypertension with CKD 4.  Lower extremity edema 5.  Hyperkalemia 6.  Anemia, likely of chronic kidney disease 7.  2D echo from September 2019 show moderate concentric left ventricular hypertrophy, LVEF 55 to 56%, grade 1 diastolic dysfunction, severely dilated left atrium 8. Delirium  Started on chronic HD. Had treatment 5/12, 5/13, 5/14   Volume removal as tolerated, start iv lasix Venofer/EPO for Anemia of CKD and iron deficiency Outpatient placement at [facility] [address]. Chair time available MWF- 3rd shift 4 pm     LOS: Devin Harmon 5/15/20204:18 PM  [place], [place]  Note: This note was prepared with Dragon dictation. Any transcription errors are unintentional

## 2019-04-15 NOTE — Progress Notes (Signed)
Confirmed chair time, [facility] MWF 4:00pm, can start patient on Monday 5/18 at 3:15. DC will contact wife to inform.

## 2019-04-15 NOTE — Progress Notes (Signed)
Tried to call dialysis twice to ask if patient is going to have dialysis tonight, awaiting call from them. RN will continue to monitor

## 2019-04-15 NOTE — Progress Notes (Signed)
[facility] [place] NAME: Devin Harmon    MR#:  [id number]  DATE OF BIRTH:  07/11/1956  SUBJECTIVE:   The RN patient has been on and off confused. Pulled IV line out. He is status post perm cath placement. REVIEW OF SYSTEMS:   Review of Systems  Constitutional: Negative for chills, fever and weight loss.  HENT: Negative for ear discharge, ear pain and nosebleeds.   Eyes: Negative for blurred vision, pain and discharge.  Respiratory: Negative for sputum production, shortness of breath, wheezing and stridor.   Cardiovascular: Negative for chest pain, palpitations, orthopnea and PND.  Gastrointestinal: Negative for abdominal pain, diarrhea, nausea and vomiting.  Genitourinary: Negative for frequency and urgency.  Musculoskeletal: Negative for back pain and joint pain.  Neurological: Negative for sensory change, speech change, focal weakness and weakness.  Psychiatric/Behavioral: Negative for depression and hallucinations. The patient is not nervous/anxious.      DRUG ALLERGIES:   Allergies  Allergen Reactions  . Chlorthalidone     REACTION: felt "bad" and ED  . Hydrocodone Other (See Comments)    Nausea and dizzy  . Sulfonamide Derivatives Hives    REACTION: unspecified    VITALS:  Blood pressure (!) 165/88, pulse 94, temperature 98.4 F (36.9 C), resp. rate 19, height 6' (1.829 m), weight 73.9 kg, SpO2 96 %.  PHYSICAL EXAMINATION:   Physical Exam  GENERAL:  63 y.o.-year-old patient lying in the bed with no acute distress. Critically ill EYES: Pupils equal, round, reactive to light and accommodation. No scleral icterus. Extraocular muscles intact.  HEENT: Head atraumatic, normocephalic. Oropharynx and nasopharynx clear.  NECK:  Supple, no jugular venous distention. No thyroid enlargement, no tenderness.  LUNGS: Normal breath sounds bilaterally, no wheezing, rales, rhonchi. No use of accessory muscles of respiration.   Right perm cath + CARDIOVASCULAR: S1, S2 normal. No murmurs, rubs, or gallops.  ABDOMEN: Soft, nontender, nondistended. Bowel sounds present. No organomegaly or mass.  EXTREMITIES: No cyanosis, clubbing or edema b/l.    NEUROLOGIC: grossly nonfocal. Moves all extremities well PSYCHIATRIC: alert and oriented  SKIN: No obvious rash, lesion, or ulcer.   LABORATORY PANEL:  CBC Recent Labs  Lab 04/14/19 0442  WBC 8.6  HGB 7.8*  HCT 24.9*  PLT 266    Chemistries  Recent Labs  Lab 04/12/19 0926  04/14/19 0442  NA 138   < > 141  K 5.2*   < > 4.9  CL 105   < > 100  CO2 15*   < > 26  GLUCOSE 113*   < > 106*  BUN 115*   < > 68*  CREATININE 14.93*   < > 9.52*  CALCIUM 7.4*   < > 7.6*  AST 16  --   --   ALT 17  --   --   ALKPHOS 47  --   --   BILITOT 0.5  --   --    < > = values in this interval not displayed.   Cardiac Enzymes Recent Labs  Lab 04/12/19 0926  TROPONINI <0.03   RADIOLOGY:  Ct Head Wo Contrast  Result Date: 04/13/2019 CLINICAL DATA:  Altered mental status today. EXAM: CT HEAD WITHOUT CONTRAST TECHNIQUE: Contiguous axial images were obtained from the base of the skull through the vertex without intravenous contrast. COMPARISON:  None. FINDINGS: Brain: No evidence of acute infarction, hemorrhage, hydrocephalus, extra-axial collection or mass lesion/mass effect. Atrophy, chronic microvascular ischemic change remote lacunar  infarction in the left basal ganglia noted. Vascular: No hyperdense vessel or unexpected calcification. Skull: Intact.  No focal lesion. Sinuses/Orbits: Short air-fluid level right maxillary sinus is seen. Other: None. IMPRESSION: No acute intracranial abnormality. Atrophy and chronic microvascular ischemic change. Mild right maxillary sinus disease. Electronically Signed   By: Inge Rise M.D.   On: 04/13/2019 17:09   US Venous Img Lower Unilateral Right  Result Date: 04/14/2019 CLINICAL DATA:  Right lower extremity edema for the past 2 days.  Evaluate for DVT. EXAM: RIGHT LOWER EXTREMITY VENOUS DOPPLER ULTRASOUND TECHNIQUE: Gray-scale sonography with graded compression, as well as color Doppler and duplex ultrasound were performed to evaluate the lower extremity deep venous systems from the level of the common femoral vein and including the common femoral, femoral, profunda femoral, popliteal and calf veins including the posterior tibial, peroneal and gastrocnemius veins when visible. The superficial great saphenous vein was also interrogated. Spectral Doppler was utilized to evaluate flow at rest and with distal augmentation maneuvers in the common femoral, femoral and popliteal veins. COMPARISON:  None. FINDINGS: Contralateral Common Femoral Vein: Respiratory phasicity is normal and symmetric with the symptomatic side. No evidence of thrombus. Normal compressibility. Common Femoral Vein: No evidence of thrombus. Dialysis catheter is seen within otherwise patent right common femoral vein. Normal compressibility and flow on color Doppler imaging. Saphenofemoral Junction: No evidence of thrombus. Normal compressibility and flow on color Doppler imaging. Profunda Femoral Vein: No evidence of thrombus. Normal compressibility and flow on color Doppler imaging. Femoral Vein: No evidence of thrombus. Normal compressibility, respiratory phasicity and response to augmentation. Popliteal Vein: No evidence of thrombus. Normal compressibility, respiratory phasicity and response to augmentation. Calf Veins: No evidence of thrombus. Normal compressibility and flow on color Doppler imaging. Superficial Great Saphenous Vein: No evidence of thrombus. Normal compressibility. Venous Reflux:  None. Other Findings:  None. IMPRESSION: No evidence of DVT within right lower extremity. Specifically, the right common femoral vein which contains a portion of the femoral approach dialysis catheter appears patent. Electronically Signed   By: Sandi Mariscal M.D.   On: 04/14/2019 06:58    Dg Chest Port 1 View  Result Date: 04/14/2019 CLINICAL DATA:  Respiratory failure. EXAM: PORTABLE CHEST 1 VIEW COMPARISON:  Radiographs 04/12/2019 FINDINGS: Endotracheal and enteric tubes have been removed. Persistent but improving bilateral patchy opacities in both lungs. Mild cardiomegaly is similar. Suspect small pleural effusions. No pneumothorax. IMPRESSION: Persistent but improving bilateral patchy opacities, edema versus multifocal pneumonia. Suspect small pleural effusions. Electronically Signed   By: Keith Rake M.D.   On: 04/14/2019 02:51   ASSESSMENT AND PLAN:   Devin Harmon  is a 63 y.o. male with a known history of end-stage renal disease was started on dialysis in April with similar symptoms of acute on chronic shortness of breath. Patient refused dialysis at prior to discharge was placed on hospice and discharged to home. He became short of breath again now presents with worsening short of breath and lower extremity edema.   1. Acute hypoxic respiratory failure secondary to severe pulmonary edema in the setting of end-stage renal disease--  intubated and on the ventilator-- patient now extubated -patient had declined dialysis when he was discharged home with hospice in April 2020. -Patient was seen by Dr. Candiss Norse.  -Patient currently getting dialyzed with significant amount of ultrafiltration. His oxygen saturations improved.  2. End-stage renal disease on hemodialysis -patient now agreeable for dialysis. -Education officer, museum for outpatient dialysis chair time -permanent catheter  Placed today  3. Malignant hypertension -continue home meds. PRN IV hydralazine  4. Anemia of chronic disease in the setting of end-stage renal disease -per Dr. Candiss Norse  5. DVT prophylaxis subcu heparin  Case discussed with Care Management/Social Worker.  CODE STATUS: full  DVT Prophylaxis:heparin  TOTAL TIME TAKING CARE OF THIS PATIENT: *25* minutes.  >50% time spent on counselling and  coordination of care  POSSIBLE D/C IN 1-2* DAYS, DEPENDING ON CLINICAL CONDITION.  Note: This dictation was prepared with Dragon dictation along with smaller phrase technology. Any transcriptional errors that result from this process are unintentional.  Fritzi Mandes M.D on 04/15/2019 at 3:36 PM  Between 7am to 6pm - Pager - [phone]  After 6pm go to [email or web address] - password [email or web address] [facility]  Office  [phone]  CC: Primary care physician; Venia Carbon, MDPatient ID: Devin Harmon, male   DOB: 30-Dec-1955, 63 y.o.   MRN: [id number]

## 2019-04-16 LAB — RENAL FUNCTION PANEL
Albumin: 3.4 g/dL — ABNORMAL LOW (ref 3.5–5.0)
Anion gap: 11 (ref 5–15)
BUN: 44 mg/dL — ABNORMAL HIGH (ref 8–23)
CO2: 29 mmol/L (ref 22–32)
Calcium: 8 mg/dL — ABNORMAL LOW (ref 8.9–10.3)
Chloride: 98 mmol/L (ref 98–111)
Creatinine, Ser: 8.54 mg/dL — ABNORMAL HIGH (ref 0.61–1.24)
GFR calc Af Amer: 7 mL/min — ABNORMAL LOW (ref >60)
GFR calc non Af Amer: 6 mL/min — ABNORMAL LOW (ref >60)
Glucose, Bld: 119 mg/dL — ABNORMAL HIGH (ref 70–99)
Phosphorus: 3.7 mg/dL (ref 2.5–4.6)
Potassium: 3.6 mmol/L (ref 3.5–5.1)
Sodium: 138 mmol/L (ref 135–145)

## 2019-04-16 LAB — CBC
HCT: 22.9 % — ABNORMAL LOW (ref 39.0–52.0)
Hemoglobin: 7.2 g/dL — ABNORMAL LOW (ref 13.0–17.0)
MCH: 26.9 pg (ref 26.0–34.0)
MCHC: 31.4 g/dL (ref 30.0–36.0)
MCV: 85.4 fL (ref 80.0–100.0)
Platelets: 281 10*3/uL (ref 150–400)
RBC: 2.68 MIL/uL — ABNORMAL LOW (ref 4.22–5.81)
RDW: 14.7 % (ref 11.5–15.5)
WBC: 7.6 10*3/uL (ref 4.0–10.5)
nRBC: 0 % (ref 0.0–0.2)

## 2019-04-16 MED ORDER — LOSARTAN POTASSIUM 50 MG PO TABS
100.0000 mg | ORAL_TABLET | Freq: Every day | ORAL | Status: DC
  Administered 2019-04-16: 100 mg via ORAL
  Filled 2019-04-16: qty 2

## 2019-04-16 MED ORDER — QUETIAPINE FUMARATE 25 MG PO TABS
25.0000 mg | ORAL_TABLET | Freq: Every day | ORAL | Status: DC
  Administered 2019-04-16: 25 mg via ORAL
  Filled 2019-04-16 (×2): qty 1

## 2019-04-16 NOTE — Progress Notes (Signed)
[place], [place] 04/16/19  Subjective:   Patient remains delirious.  Able to answer a few questions but not oriented to time or place Seen during dialysis Tolerating well Requiring a sitter  HEMODIALYSIS FLOWSHEET:  Blood Flow Rate (mL/min): 300 mL/min Arterial Pressure (mmHg): -170 mmHg Venous Pressure (mmHg): 190 mmHg Transmembrane Pressure (mmHg): 80 mmHg Ultrafiltration Rate (mL/min): 730 mL/min Dialysate Flow Rate (mL/min): 600 ml/min Conductivity: Machine : 14 Conductivity: Machine : 14 Dialysis Fluid Bolus: Normal Saline Bolus Amount (mL): 250 mL    Objective:  Vital signs in last 24 hours:  Temp:  [97.8 F (36.6 C)-99.6 F (37.6 C)] 98.6 F (37 C) (05/16 1354) Pulse Rate:  [66-89] 75 (05/16 1354) Resp:  [15-26] 18 (05/16 1354) BP: (131-173)/(68-116) 173/89 (05/16 1354) SpO2:  [96 %-100 %] 96 % (05/16 1354) Weight:  [73 kg-76.5 kg] 76.5 kg (05/16 1234)  Weight change:  Filed Weights   04/14/19 1157 04/16/19 0913 04/16/19 1234  Weight: 73.9 kg 73 kg 76.5 kg    Intake/Output:    Intake/Output Summary (Last 24 hours) at 04/16/2019 1410 Last data filed at 04/16/2019 1234 Gross per 24 hour  Intake -  Output 2000 ml  Net -2000 ml     Physical Exam: General:  No acute distress, laying in the bed  HEENT  moist oral mucus membranes  Neck  supple, no masses  Pulm/lungs  clear bilaterally, room air  CVS/Heart   regular  Abdomen:   Soft, mildly distended  Extremities:  + pitting edema, mitten restraints  Neurologic:  delirius, able to follow few commands,  Skin:  No acute distress  Access:  permcath 04/15/19       Basic Metabolic Panel:  Recent Labs  Lab 04/12/19 0926 04/12/19 1936 04/13/19 0450 04/14/19 0442 04/16/19 0930  NA 138  --  140 141 138  K 5.2*  --  5.6* 4.9 3.6  CL 105  --  103 100 98  CO2 15*  --  23 26 29   GLUCOSE 113*  --  89 106* 119*  BUN 115*  --  95* 68* 44*  CREATININE 14.93*  --  12.33*  9.52* 8.54*  CALCIUM 7.4*  --  7.3* 7.6* 8.0*  PHOS  --  5.5*  --   --  3.7     CBC: Recent Labs  Lab 04/12/19 0926 04/13/19 0450 04/14/19 0442 04/16/19 0930  WBC 7.8 8.4 8.6 7.6  NEUTROABS 6.2  --   --   --   HGB 7.8* 7.1* 7.8* 7.2*  HCT 24.4* 22.7* 24.9* 22.9*  MCV 85.0 86.6 87.7 85.4  PLT 287 280 266 281      Lab Results  Component Value Date   HEPBSAG Negative 03/27/2019   HEPBSAB Non Reactive 03/27/2019      Microbiology:  Recent Results (from the past 240 hour(s))  SARS Coronavirus 2 (CEPHEID- Performed in [facility] hospital lab), Hosp Order     Status: None   Collection Time: 04/12/19  9:26 AM  Result Value Ref Range Status   SARS Coronavirus 2 NEGATIVE NEGATIVE Final    Comment: (NOTE) If result is NEGATIVE SARS-CoV-2 target nucleic acids are NOT DETECTED. The SARS-CoV-2 RNA is generally detectable in upper and lower  respiratory specimens during the acute phase of infection. The lowest  concentration of SARS-CoV-2 viral copies this assay can detect is 250  copies / mL. A negative result does not preclude SARS-CoV-2 infection  and should not be used as  the sole basis for treatment or other  patient management decisions.  A negative result may occur with  improper specimen collection / handling, submission of specimen other  than nasopharyngeal swab, presence of viral mutation(s) within the  areas targeted by this assay, and inadequate number of viral copies  (<250 copies / mL). A negative result must be combined with clinical  observations, patient history, and epidemiological information. If result is POSITIVE SARS-CoV-2 target nucleic acids are DETECTED. The SARS-CoV-2 RNA is generally detectable in upper and lower  respiratory specimens dur ing the acute phase of infection.  Positive  results are indicative of active infection with SARS-CoV-2.  Clinical  correlation with patient history and other diagnostic information is  necessary to determine  patient infection status.  Positive results do  not rule out bacterial infection or co-infection with other viruses. If result is PRESUMPTIVE POSTIVE SARS-CoV-2 nucleic acids MAY BE PRESENT.   A presumptive positive result was obtained on the submitted specimen  and confirmed on repeat testing.  While 2019 novel coronavirus  (SARS-CoV-2) nucleic acids may be present in the submitted sample  additional confirmatory testing may be necessary for epidemiological  and / or clinical management purposes  to differentiate between  SARS-CoV-2 and other Sarbecovirus currently known to infect humans.  If clinically indicated additional testing with an alternate test  methodology [phone]) is advised. The SARS-CoV-2 RNA is generally  detectable in upper and lower respiratory sp ecimens during the acute  phase of infection. The expected result is Negative. Fact Sheet for Patients:  StrictlyIdeas.no Fact Sheet for Healthcare Providers: BankingDealers.co.za This test is not yet approved or cleared by the [place] FDA and has been authorized for detection and/or diagnosis of SARS-CoV-2 by FDA under an Emergency Use Authorization (EUA).  This EUA will remain in effect (meaning this test can be used) for the duration of the COVID-19 declaration under Section 564(b)(1) of the Act, 21 U.S.C. section 360bbb-3(b)(1), unless the authorization is terminated or revoked sooner. Performed at [facility], [address] [place], [place] [address]   MRSA PCR Screening     Status: None   Collection Time: 04/12/19  4:56 PM  Result Value Ref Range Status   MRSA by PCR NEGATIVE NEGATIVE Final    Comment:        The GeneXpert MRSA Assay (FDA approved for NASAL specimens only), is one component of a comprehensive MRSA colonization surveillance program. It is not intended to diagnose MRSA infection nor to guide or monitor treatment for MRSA  infections. Performed at [facility], [address] [place],  [address]     Coagulation Studies: No results for input(s): LABPROT, INR in the last 72 hours.  Urinalysis: No results for input(s): COLORURINE, LABSPEC, PHURINE, GLUCOSEU, HGBUR, BILIRUBINUR, KETONESUR, PROTEINUR, UROBILINOGEN, NITRITE, LEUKOCYTESUR in the last 72 hours.  Invalid input(s): APPERANCEUR    Imaging: No results found.   Medications:    . amLODipine  10 mg Oral Daily  . Chlorhexidine Gluconate Cloth  6 each Topical Q0600  . epoetin (EPOGEN/PROCRIT) injection  4,000 Units Intravenous Q T,Th,Sa-HD  . heparin  5,000 Units Subcutaneous Q8H  . labetalol  100 mg Oral BID  . minoxidil  5 mg Oral Daily  . multivitamin with minerals  1 tablet Oral Daily  . QUEtiapine  25 mg Oral QHS  . torsemide  40 mg Oral Daily   haloperidol lactate, hydrALAZINE, nitroGLYCERIN, polyethylene glycol  Assessment/ Plan:  63 y.o. African-American male with  hypertension, BPH, advanced CKD, was admitted on 03/27/2019 with uremia and acute pulmonary edema.   1.  End-stage renal disease 2.  Acute pulmonary edema/Acute respiratory failure 3.  Severe hypertension with CKD 4.  Lower extremity edema 5.  Hyperkalemia 6.  Anemia, likely of chronic kidney disease 7.  2D echo from September 2019 show moderate concentric left ventricular hypertrophy, LVEF 55 to 24%, grade 1 diastolic dysfunction, severely dilated left atrium 8. Delirium  Started on chronic HD. Had treatment 5/12, 5/13, 5/14, 5/16   Volume removal with hemodialysis as tolerated.  Continue oral torsemide Requiring antipsychotics for agitation Venofer/EPO for Anemia of CKD and iron deficiency Outpatient placement at [facility] [address]. Chair time available MWF- 3rd shift 4 pm Start ARB for blood pressure control Goal to discontinue minoxidil eventually Next HD planned for monday     LOS: [place] 5/16/20202:10 PM  [place], [place]  Note: This note was prepared with Dragon dictation. Any transcription errors are unintentional

## 2019-04-16 NOTE — Progress Notes (Signed)
Dialysis called, states they are sending for patient. AM Meds held until patient returns to the floor. Sitter to accompany patient.

## 2019-04-16 NOTE — Progress Notes (Signed)
Dr. Verdell Carmine made aware of penile bleeding r/t patient pulling out foley last night. Per MD, monitor and make MD aware if bleeding increases. Peri care provided by NT at bedside and bedside underpad changed.

## 2019-04-16 NOTE — Progress Notes (Signed)
PRE-HD  04/16/19 0913  Hand-Off documentation  Report received from (Full Name) Devin Rota RN  Vital Signs  Temp 97.8 F (36.6 C)  Temp Source Oral  Pulse Rate 70  Pulse Rate Source Monitor  Resp 15  BP (!) 148/76  BP Location Left Arm  BP Method Automatic  Patient Position (if appropriate) Lying  Oxygen Therapy  SpO2 100 %  O2 Device Room Air  Pain Assessment  Pain Scale 0-10  Pain Score Asleep  Dialysis Weight  Weight 73 kg  Type of Weight Pre-Dialysis  Time-Out for Hemodialysis  What Procedure? HD  Pt Identifiers(min of two) MRN/Account#;First/Last Name  Correct Site? Yes  Correct Side? Yes  Correct Procedure? Yes  Consents Verified? Yes  Rad Studies Available? N/A  Safety Precautions Reviewed? Yes  Engineer, civil (consulting) Number 7  Station Number 1  UF/Alarm Test Passed  Conductivity: Meter 14  Conductivity: Machine  14  pH 7.3  Reverse Osmosis main  Normal Saline Lot Number [id number]  Dialyzer Lot Number [id number]  Disposable Set Lot Number [id number]  Machine Temperature 98.6 F (37 C)  Musician and Audible Yes  Blood Lines Intact and Secured Yes  Pre Treatment Patient Checks  Vascular access used during treatment Catheter  Hepatitis B Surface Antigen Results Negative  Date Hepatitis B Surface Antigen Drawn 03/27/19  Hepatitis B Surface Antibody  (<10)  Date Hepatitis B Surface Antibody Drawn 03/27/19  Hemodialysis Consent Verified Yes  Hemodialysis Standing Orders Initiated Yes  ECG (Telemetry) Monitor On Yes  Prime Ordered Normal Saline  Length of  DialysisTreatment -hour(s) 3 Hour(s)  Dialyzer Elisio 17H NR  Dialysate 2K, 2.5 Ca  Variable Sodium  (140)  Dialysis Anticoagulant None  Dialysate Flow Ordered 300  Blood Flow Rate Ordered 600 mL/min  Ultrafiltration Goal 2 Liters  Pre Treatment Labs BMET;CBC  Dialysis Blood Pressure Support Ordered Normal Saline  Education / Care Plan  Dialysis Education Provided Yes  Documented  Education in Care Plan Yes  Hemodialysis Catheter Right Subclavian  Placement Date/Time: 04/15/19 0919   Time Out: Correct patient;Correct site;Correct procedure  Maximum sterile barrier precautions: Hand hygiene;Cap;Mask;Sterile gown;Sterile gloves;Large sterile sheet  Site Prep: Chlorhexidine  Local Anesthetic: Inje...  Site Condition No complications  Blue Lumen Status Flushed;Blood return noted  Red Lumen Status Flushed;Blood return noted  Purple Lumen Status N/A  Dressing Type Biopatch;Occlusive  Dressing Status Clean;Dry;Intact  Interventions New dressing  Drainage Description None  Dressing Change Due 04/23/19

## 2019-04-16 NOTE — Progress Notes (Signed)
Devin Harmon at [place] NAME: Devin Harmon    MR#:  [id number]  DATE OF BIRTH:  [birth year]  SUBJECTIVE:   Patient remains confused.  Had hemodialysis today.  Patient's safety sitter is at bedside and he has mitts on.  Patient wants to go home.   Pt. Have some hematuria due to pt. Pulling out his foley yesterday.   REVIEW OF SYSTEMS:    Review of Systems  Unable to perform ROS: Mental acuity    Nutrition: renal/carb modified Tolerating Diet: Yes Tolerating PT: Await Eval.   DRUG ALLERGIES:   Allergies  Allergen Reactions  . Chlorthalidone     REACTION: felt "bad" and ED  . Hydrocodone Other (See Comments)    Nausea and dizzy  . Sulfonamide Derivatives Hives    REACTION: unspecified    VITALS:  Blood pressure (!) 173/89, pulse 75, temperature 98.6 F (37 C), temperature source Oral, resp. rate 18, height 6' (1.829 m), weight 76.5 kg, SpO2 96 %.  PHYSICAL EXAMINATION:   Physical Exam  GENERAL:  63 y.o.-year-old patient lying in bed confused with mitts on in NAD.  EYES: Pupils equal, round, reactive to light and accommodation. No scleral icterus. Extraocular muscles intact.  HEENT: Head atraumatic, normocephalic. Oropharynx and nasopharynx clear.  NECK:  Supple, no jugular venous distention. No thyroid enlargement, no tenderness.  LUNGS: Normal breath sounds bilaterally, no wheezing, rales, rhonchi. No use of accessory muscles of respiration.  CARDIOVASCULAR: S1, S2 normal. No murmurs, rubs, or gallops.  ABDOMEN: Soft, nontender, nondistended. Bowel sounds present. No organomegaly or mass.  EXTREMITIES: No cyanosis, clubbing or edema b/l.    NEUROLOGIC: Cranial nerves II through XII are intact. No focal Motor or sensory deficits b/l. Globally weak.    PSYCHIATRIC: The patient is alert and oriented x 2.  SKIN: No obvious rash, lesion, or ulcer.   Right Chest wall perm-cath in place.   LABORATORY PANEL:   CBC Recent Labs   Lab 04/16/19 0930  WBC 7.6  HGB 7.2*  HCT 22.9*  PLT 281   ------------------------------------------------------------------------------------------------------------------  Chemistries  Recent Labs  Lab 04/12/19 0926  04/16/19 0930  NA 138   < > 138  K 5.2*   < > 3.6  CL 105   < > 98  CO2 15*   < > 29  GLUCOSE 113*   < > 119*  BUN 115*   < > 44*  CREATININE 14.93*   < > 8.54*  CALCIUM 7.4*   < > 8.0*  AST 16  --   --   ALT 17  --   --   ALKPHOS 47  --   --   BILITOT 0.5  --   --    < > = values in this interval not displayed.   ------------------------------------------------------------------------------------------------------------------  Cardiac Enzymes Recent Labs  Lab 04/12/19 0926  TROPONINI <0.03   ------------------------------------------------------------------------------------------------------------------  RADIOLOGY:  No results found.   ASSESSMENT AND PLAN:   63 y.o.malewith a known history of end-stage renal disease was started on dialysis in April with similar symptoms of acute on chronic shortness of breath. Patient refused dialysis at prior to discharge was placed on hospice and discharged to home. He became short of breath again now presents with worsening short of breath and lower extremity edema.  1.Acute hypoxic respiratory failure secondary to severe pulmonary edema in the setting of end-stage renal disease--  intubated and was on the ventilator-- patient now extubated -  patient had declined dialysis when he was discharged home with hospice in April 2020. -Now agreed to hemodialysis and has been getting hemodialysis and volume status much improved.  Hypoxia is improved and he is currently on room air.   2.End-stage renal disease on hemodialysis - Patient now agreeable to dialysis and is getting it on a Tuesday Thursday Saturday schedule. -As per nephrology has an outpatient spot already.  3.  Altered mental status/encephalopathy-  secondary to underlying renal failure/ICU delirium. - Slowly improving.  I will place him on some low-dose Seroquel today. -Avoid benzodiazepines.  Continue to follow mental status.  4.Malignant hypertension- improving. - cont. Losartan, Labetalol, Noxafil, Norvasc.  Continue as needed IV hydralazine. Blood pressure stable.  5.Anemia of chronic disease in the setting of end-stage renal disease - follow Hg. And no acute need for transfusion.   6.  Hematuria-secondary to trauma.  Patient pulled out his Foley catheter yesterday due to confusion/agitation. -We will continue to monitor hemoglobin.  If gets worse we will consider getting urology consult.  Await PT eval.    All the records are reviewed and case discussed with Care Management/Social Worker. Management plans discussed with the patient, family and they are in agreement.  CODE STATUS: Full code  DVT Prophylaxis: Hep. SQ  TOTAL TIME TAKING CARE OF THIS PATIENT: 30 minutes.   POSSIBLE D/C IN 1-2 DAYS, DEPENDING ON CLINICAL CONDITION.   Devin Harmon M.D on 04/16/2019 at 2:20 PM  Between 7am to 6pm - Pager - [phone]  After 6pm go to [email or web address] - Proofreader  [facility]  Office  [phone]  CC: Primary care physician; Devin Carbon, MD

## 2019-04-16 NOTE — Progress Notes (Signed)
HD START   04/16/19 0930  Vital Signs  Temp 97.8 F (36.6 C)  Temp Source Oral  Pulse Rate 70  Pulse Rate Source Monitor  Resp (!) 25  BP (!) 153/82  BP Location Left Arm  BP Method Automatic  Patient Position (if appropriate) Lying  Oxygen Therapy  SpO2 100 %  O2 Device Room Air  During Hemodialysis Assessment  Blood Flow Rate (mL/min) 300 mL/min  Arterial Pressure (mmHg) -110 mmHg  Venous Pressure (mmHg) 110 mmHg  Transmembrane Pressure (mmHg) 70 mmHg  Ultrafiltration Rate (mL/min) 500 mL/min  Dialysate Flow Rate (mL/min) 600 ml/min  Conductivity: Machine  14  HD Safety Checks Performed Yes  Dialysis Fluid Bolus Normal Saline  Bolus Amount (mL) 250 mL  Intra-Hemodialysis Comments Tx initiated (cvc care per policy)

## 2019-04-16 NOTE — Progress Notes (Signed)
Patient's wife called with password- updated on current plan of care.

## 2019-04-17 MED ORDER — TORSEMIDE 20 MG PO TABS: 40.0000 mg | ORAL_TABLET | Freq: Every day | ORAL | 1 refills | Status: DC | PRN

## 2019-04-17 MED ORDER — LOSARTAN POTASSIUM 100 MG PO TABS: 100.0000 mg | ORAL_TABLET | Freq: Every day | ORAL | 1 refills | Status: DC

## 2019-04-17 NOTE — Progress Notes (Signed)
Pt talking to wife on the phone asking her to come pick him up. Wife notified pt has not been seen by MD today and is not up for discharge at this time. Wife would like an update phone call after pt sees MD.

## 2019-04-17 NOTE — Progress Notes (Signed)
[facility], [place] 04/17/19  Subjective:   Patient's mental status appears to have much improved today although he still appears to be confused about certain things.  He is insistent upon going home.  Denies any shortness of breath.  States appetite is fair.  No leg edema.  Got up and walked with physical therapy and felt dizzy.  Therefore he was seated back in the chair.  His blood pressure was low normal.  Antihypertensives were held.   Objective:  Vital signs in last 24 hours:  Temp:  [98.6 F (37 C)-100.2 F (37.9 C)] 98.6 F (37 C) (05/17 0756) Pulse Rate:  [66-87] 68 (05/17 0756) Resp:  [15-26] 18 (05/17 0756) BP: (106-173)/(65-116) 106/65 (05/17 0955) SpO2:  [95 %-100 %] 97 % (05/17 0756) Weight:  [76.5 kg] 76.5 kg (05/16 1234)  Weight change:  Filed Weights   04/14/19 1157 04/16/19 0913 04/16/19 1234  Weight: 73.9 kg 73 kg 76.5 kg    Intake/Output:    Intake/Output Summary (Last 24 hours) at 04/17/2019 1013 Last data filed at 04/17/2019 0725 Gross per 24 hour  Intake 240 ml  Output 1800 ml  Net -1560 ml     Physical Exam: General:  No acute distress, sitting up in the chair  HEENT  moist oral mucus membranes  Neck  supple, no masses  Pulm/lungs  clear bilaterally, room air  CVS/Heart   regular  Abdomen:   Soft, mildly distended  Extremities:  Trace edema  Neurologic:  Alert, able to follow simple commands, not able to tell me today's date.  Knew about his birthday, but his wife  Skin:  No acute distress  Access:  permcath 04/15/19       Basic Metabolic Panel:  Recent Labs  Lab 04/12/19 0926 04/12/19 1936 04/13/19 0450 04/14/19 0442 04/16/19 0930  NA 138  --  140 141 138  K 5.2*  --  5.6* 4.9 3.6  CL 105  --  103 100 98  CO2 15*  --  23 26 29   GLUCOSE 113*  --  89 106* 119*  BUN 115*  --  95* 68* 44*  CREATININE 14.93*  --  12.33* 9.52* 8.54*  CALCIUM 7.4*  --  7.3* 7.6* 8.0*  PHOS  --  5.5*  --   --  3.7      CBC: Recent Labs  Lab 04/12/19 0926 04/13/19 0450 04/14/19 0442 04/16/19 0930  WBC 7.8 8.4 8.6 7.6  NEUTROABS 6.2  --   --   --   HGB 7.8* 7.1* 7.8* 7.2*  HCT 24.4* 22.7* 24.9* 22.9*  MCV 85.0 86.6 87.7 85.4  PLT 287 280 266 281      Lab Results  Component Value Date   HEPBSAG Negative 03/27/2019   HEPBSAB Non Reactive 03/27/2019      Microbiology:  Recent Results (from the past 240 hour(s))  SARS Coronavirus 2 (CEPHEID- Performed in [place] hospital lab), Hosp Order     Status: None   Collection Time: 04/12/19  9:26 AM  Result Value Ref Range Status   SARS Coronavirus 2 NEGATIVE NEGATIVE Final    Comment: (NOTE) If result is NEGATIVE SARS-CoV-2 target nucleic acids are NOT DETECTED. The SARS-CoV-2 RNA is generally detectable in upper and lower  respiratory specimens during the acute phase of infection. The lowest  concentration of SARS-CoV-2 viral copies this assay can detect is 250  copies / mL. A negative result does not preclude SARS-CoV-2 infection  and  should not be used as the sole basis for treatment or other  patient management decisions.  A negative result may occur with  improper specimen collection / handling, submission of specimen other  than nasopharyngeal swab, presence of viral mutation(s) within the  areas targeted by this assay, and inadequate number of viral copies  (<250 copies / mL). A negative result must be combined with clinical  observations, patient history, and epidemiological information. If result is POSITIVE SARS-CoV-2 target nucleic acids are DETECTED. The SARS-CoV-2 RNA is generally detectable in upper and lower  respiratory specimens dur ing the acute phase of infection.  Positive  results are indicative of active infection with SARS-CoV-2.  Clinical  correlation with patient history and other diagnostic information is  necessary to determine patient infection status.  Positive results do  not rule out bacterial  infection or co-infection with other viruses. If result is PRESUMPTIVE POSTIVE SARS-CoV-2 nucleic acids MAY BE PRESENT.   A presumptive positive result was obtained on the submitted specimen  and confirmed on repeat testing.  While 2019 novel coronavirus  (SARS-CoV-2) nucleic acids may be present in the submitted sample  additional confirmatory testing may be necessary for epidemiological  and / or clinical management purposes  to differentiate between  SARS-CoV-2 and other Sarbecovirus currently known to infect humans.  If clinically indicated additional testing with an alternate test  methodology [phone]) is advised. The SARS-CoV-2 RNA is generally  detectable in upper and lower respiratory sp ecimens during the acute  phase of infection. The expected result is Negative. Fact Sheet for Patients:  StrictlyIdeas.no Fact Sheet for Healthcare Providers: BankingDealers.co.za This test is not yet approved or cleared by the [place] FDA and has been authorized for detection and/or diagnosis of SARS-CoV-2 by FDA under an Emergency Use Authorization (EUA).  This EUA will remain in effect (meaning this test can be used) for the duration of the COVID-19 declaration under Section 564(b)(1) of the Act, 21 U.S.C. section 360bbb-3(b)(1), unless the authorization is terminated or revoked sooner. Performed at [facility], [address] [place], [place] [address]   MRSA PCR Screening     Status: None   Collection Time: 04/12/19  4:56 PM  Result Value Ref Range Status   MRSA by PCR NEGATIVE NEGATIVE Final    Comment:        The GeneXpert MRSA Assay (FDA approved for NASAL specimens only), is one component of a comprehensive MRSA colonization surveillance program. It is not intended to diagnose MRSA infection nor to guide or monitor treatment for MRSA infections. Performed at [facility], [address]  [place],  [address]     Coagulation Studies: No results for input(s): LABPROT, INR in the last 72 hours.  Urinalysis: No results for input(s): COLORURINE, LABSPEC, PHURINE, GLUCOSEU, HGBUR, BILIRUBINUR, KETONESUR, PROTEINUR, UROBILINOGEN, NITRITE, LEUKOCYTESUR in the last 72 hours.  Invalid input(s): APPERANCEUR    Imaging: No results found.   Medications:    . Chlorhexidine Gluconate Cloth  6 each Topical Q0600  . epoetin (EPOGEN/PROCRIT) injection  4,000 Units Intravenous Q T,Th,Sa-HD  . heparin  5,000 Units Subcutaneous Q8H  . labetalol  100 mg Oral BID  . losartan  100 mg Oral QHS  . multivitamin with minerals  1 tablet Oral Daily  . QUEtiapine  25 mg Oral QHS  . torsemide  40 mg Oral Daily   haloperidol lactate, hydrALAZINE, nitroGLYCERIN, polyethylene glycol  Assessment/ Plan:  63 y.o. African-American male with hypertension, BPH, advanced  CKD, was admitted on 03/27/2019 with uremia and acute pulmonary edema.   1.  End-stage renal disease 2.  Acute pulmonary edema/Acute respiratory failure 3.  Severe hypertension with CKD 4.  Lower extremity edema 5.  Hyperkalemia 6.  Anemia, likely of chronic kidney disease 7.  2D echo from September 2019 show moderate concentric left ventricular hypertrophy, LVEF 55 to 87%, grade 1 diastolic dysfunction, severely dilated left atrium 8. Delirium  Started on chronic HD. Had treatment 5/12, 5/13, 5/14, 5/16   Volume appears to have been optimized.  Blood pressure is now low therefore will discontinue minoxidil as well as amlodipine.  He will continue on some maintenance antihypertensives including low-dose labetalol and losartan. Patient and his wife are both insistent about him going home.  Discussed with wife that he is still having periods of confusion and forgetfulness and has to be oriented at times.  Patient's wife states that some of it is chronic for him.  She is comfortable taking care of him at home.  Continue  Venofer/EPO for Anemia of CKD and iron deficiency Outpatient placement at [facility] [address]. Chair time available MWF- 3rd shift 4 pm      LOS: [place] 5/17/202010:13 AM  [place], [place]  Note: This note was prepared with Dragon dictation. Any transcription errors are unintentional

## 2019-04-17 NOTE — Evaluation (Signed)
Physical Therapy Evaluation Patient Details Name: Devin Harmon MRN: [id number] DOB: 1956-02-21 Today's Date: 04/17/2019   History of Present Illness  63 y.o. male with a known history of end-stage renal disease was started on dialysis in April with similar symptoms of acute on chronic shortness of breath. Patient refused dialysis prior to discharge was placed on hospice and discharged to home. He became short of breath again now presents with worsening short of breath and lower extremity edema. was intubated 5/12, extubated 5/13. Permcath placed for HD.    Clinical Impression  Pt in bed, oriented to self, situation, behavior WFLs during session, still complaining of SOB and intermittent coughing noted. 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  Pt demonstrated bed mobility mod I. Sit <> stand attempted without AD and with RW. Pt exhibited poor initial standing balance, minA to correct LOB. Some improvement noted with RW. Pt ambulated in room ~35ft, first 36ft without RW. Pt exhibited staggering and needed handheld assist to address imbalances. With RW pt balance slightly improved, but still exhibited weaving of gait. Pt complained of dizziness during ambulation, so BP assessed, pt orthostatic and RN notified.  Overall the patient demonstrated deficits (see "PT Problem List") that impede the patient's functional abilities, safety, and mobility and would benefit from skilled PT intervention. Pt educated on his mobility, resistant to the idea of STR at this time. Pt may be able to go home with supervision for OOB/mobility but does not have adequate support at this time.     Follow Up Recommendations SNF    Equipment Recommendations  Rolling walker with 5" wheels    Recommendations for Other Services       Precautions / Restrictions Precautions Precaution Comments: BP, new permcath Restrictions Weight Bearing Restrictions: No      Mobility  Bed  Mobility Overal bed mobility: Modified Independent                Transfers Overall transfer level: Needs assistance Equipment used: None;1 person hand held assist Transfers: Sit to/from Stand Sit to Stand: Min assist;Min guard         General transfer comment: Pt able to rise without AD but exhibited deficits in immediate standing balance, minA to correct.  Ambulation/Gait   Gait Distance (Feet): 15 Feet Assistive device: Rolling walker (2 wheeled);None;1 person hand held assist Gait Pattern/deviations: Staggering left;Staggering right     General Gait Details: Pt exhibited staggering and unsteadiness with first 7 ft of ambulation, provided handheld assist with mild improvement. RW most efficient at improving balance, though pt still with staggering/weaving  Stairs            Wheelchair Mobility    Modified Rankin (Stroke Patients Only)       Balance Overall balance assessment: Needs assistance Sitting-balance support: Feet supported Sitting balance-Leahy Scale: Good       Standing balance-Leahy Scale: Poor                               Pertinent Vitals/Pain Pain Assessment: No/denies pain    Home Living Family/patient expects to be discharged to:: Private residence Living Arrangements: Spouse/significant other Available Help at Discharge: Family Type of Home: House Home Access: Stairs to enter Entrance Stairs-Rails: Psychiatric nurse of Steps: 5 Home Layout: One level Home Equipment: None      Prior Function Level of Independence: Independent  Comments: Pt reported level entry into home, but chart review stated 5 stairs with rails from 7 months ago     Hand Dominance        Extremity/Trunk Assessment   Upper Extremity Assessment Upper Extremity Assessment: Generalized weakness    Lower Extremity Assessment Lower Extremity Assessment: Generalized weakness(able to lift legs one at a time off  of bed)       Communication   Communication: No difficulties  Cognition Arousal/Alertness: Awake/alert Behavior During Therapy: WFL for tasks assessed/performed Overall Cognitive Status: No family/caregiver present to determine baseline cognitive functioning                                        General Comments      Exercises Other Exercises Other Exercises: assessed orthstatic vitals see flowsheet   Assessment/Plan    PT Assessment Patient needs continued PT services  PT Problem List Decreased strength;Decreased mobility;Decreased safety awareness;Decreased range of motion;Decreased activity tolerance;Decreased balance       PT Treatment Interventions DME instruction;Functional mobility training;Balance training;Patient/family education;Gait training;Therapeutic activities;Neuromuscular re-education;Stair training;Therapeutic exercise    PT Goals (Current goals can be found in the Care Plan section)  Acute Rehab PT Goals Patient Stated Goal: to go home PT Goal Formulation: With patient Time For Goal Achievement: 05/01/19 Potential to Achieve Goals: Good    Frequency Min 2X/week   Barriers to discharge Decreased caregiver support      Co-evaluation               AM-PAC PT "6 Clicks" Mobility  Outcome Measure Help needed turning from your back to your side while in a flat bed without using bedrails?: None Help needed moving from lying on your back to sitting on the side of a flat bed without using bedrails?: None Help needed moving to and from a bed to a chair (including a wheelchair)?: A Little Help needed standing up from a chair using your arms (e.g., wheelchair or bedside chair)?: A Little Help needed to walk in hospital room?: A Little Help needed climbing 3-5 steps with a railing? : A Lot 6 Click Score: 19    End of Session Equipment Utilized During Treatment: Gait belt Activity Tolerance: Patient tolerated treatment well Patient  left: in chair;with nursing/sitter in room Nurse Communication: Mobility status PT Visit Diagnosis: Unsteadiness on feet (R26.81);Muscle weakness (generalized) (M62.81);Difficulty in walking, not elsewhere classified (R26.2)    Time: 1610-9604 PT Time Calculation (min) (ACUTE ONLY): 24 min   Charges:   PT Evaluation $PT Eval Low Complexity: 1 Low PT Treatments $Therapeutic Activity: 8-22 mins        Lieutenant Diego PT, DPT 12:45 PM,04/17/19 [phone]

## 2019-04-17 NOTE — Progress Notes (Signed)
Patient feeling much better this afternoon. Periods of confusion/forgetfullness, but orientation improved compared to this morning. Asymptomatic with position changes and walking to and from bathroom. Dr. Verdell Carmine aware, instructed to proceed with discharge.   Devin Harmon to be D/C'd Home per MD order. Patient given discharge teaching and paperwork regarding medications, diet, follow-up appointments and activity. Patient understanding verbalized; discussed discharge papers with wife via telephone. No questions or complaints at this time. Skin condition as charted. IV and telemetry removed prior to leaving.  Patient and family refusing short term rehab and home health services. No further needs by Care Management/Social Work. Prescriptions placed in discharge envelope with After Visit Summary. Wife has already been in contact with PCP and is aware of dialysis schedule and details.  Wife to provide transportation.  [name]

## 2019-04-17 NOTE — Progress Notes (Signed)
Pt eating breakfast tray 

## 2019-04-17 NOTE — Progress Notes (Signed)
Pt eating lunch tray  

## 2019-04-17 NOTE — Discharge Summary (Signed)
[facility] [place] NAME: Devin Harmon    MR#:  [id number]  DATE OF BIRTH:  January 03, 1956  DATE OF ADMISSION:  04/12/2019 ADMITTING PHYSICIAN: Fritzi Mandes, MD  DATE OF DISCHARGE: 04/17/2019  3:10 PM  PRIMARY CARE PHYSICIAN: Venia Carbon, MD    ADMISSION DIAGNOSIS:  End stage renal disease ([place]) [N18.6] Acute pulmonary edema ([place]) [J81.0] Acute respiratory failure with hypoxia ([place]) [J96.01]  DISCHARGE DIAGNOSIS:  Active Problems:   Acute pulmonary edema ([place])   SECONDARY DIAGNOSIS:   Past Medical History:  Diagnosis Date  . Allergy   . BPH (benign prostatic hypertrophy)   . Hypertension   . Renal insufficiency   . Zoster 2007   facial, hospital 2007    HOSPITAL COURSE:   63 y.o.malewith a known history of end-stage renal disease was started on dialysis in April with similar symptoms of acute on chronic shortness of breath. Patient refused dialysis at prior to discharge was placed on hospice and discharged to home. He became short of breath again now presents with worsening short of breath and lower extremity edema.  1.Acute hypoxic respiratory failure secondary to severe pulmonary edema in the setting of end-stage renal disease -Initially when patient was admitted to the hospital he had refused hemodialysis and therefore was volume overloaded.  He was intubated and underwent hemodialysis and his volume status improved.  He has been weaned off the ventilator and extubated and now currently is on room air and volume status is stable as patient is on dialysis.  2.End-stage renal disease on hemodialysis -Upon admission patient was not agreeable to dialysis but eventually did agree and was started on a Tuesday Thursday Saturday schedule.  He is tolerated multiple dialysis treatments while in the hospital.  He had a right sided chest wall PermCath placed.  He is now being discharged home with continued outpatient dialysis.  3.   Altered mental status/encephalopathy- secondary to underlying renal failure/ICU delirium. -This slowly improved this patient's renal function improved and with dialysis -Mental status is close to baseline now.  Discussed with patient's wife and granddaughter over the phone and they are agreeable to discharge home.  4.Malignant hypertension-the patient's blood pressures were significantly elevated therefore multiple antihypertensives were added.  Patient was started on labetalol and maintained on his Norvasc, minoxidil and also losartan was added. - Prior to discharge patient had some relative hypotension and orthostasis therefore his medications had to be adjusted. - At this point patient will be discharged on a labetalol and losartan.  He has taken off the Norvasc, minoxidil and he will use Demadex as needed for edema.  5.Anemia of chronic disease in the setting of end-stage renal disease -Patient received EPO with dialysis, his hemoglobin remained stable.  This can be further followed as an outpatient.  6.  Hematuria-secondary to trauma.  Patient pulled out his Foley catheter during hospitalization.  Hematuria has actually improved.  Hemoglobin has remained stable.  This is resolved now.    Patient was seen by physical therapy and they recommended short-term rehab but the patient's family did not want that, I offered them home health services which they refused presently.  DISCHARGE CONDITIONS:   STable  CONSULTS OBTAINED:  Treatment Team:  Murlean Iba, MD Schnier, Dolores Lory, MD Henreitta Leber, MD  DRUG ALLERGIES:   Allergies  Allergen Reactions  . Chlorthalidone     REACTION: felt "bad" and ED  . Hydrocodone Other (See Comments)    Nausea  and dizzy  . Sulfonamide Derivatives Hives    REACTION: unspecified    DISCHARGE MEDICATIONS:   Allergies as of 04/17/2019      Reactions   Chlorthalidone    REACTION: felt "bad" and ED   Hydrocodone Other (See Comments)    Nausea and dizzy   Sulfonamide Derivatives Hives   REACTION: unspecified      Medication List    STOP taking these medications   amLODipine 10 MG tablet Commonly known as:  NORVASC   minoxidil 2.5 MG tablet Commonly known as:  LONITEN     TAKE these medications   acetaminophen 500 MG tablet Commonly known as:  TYLENOL Take 500-1,000 mg by mouth every 6 (six) hours as needed for mild pain.   cetirizine 10 MG tablet Commonly known as:  ZYRTEC Take 10 mg by mouth every evening.   fluticasone 50 MCG/ACT nasal spray Commonly known as:  FLONASE Place 1-2 sprays into both nostrils daily.   labetalol 100 MG tablet Commonly known as:  NORMODYNE Take 1 tablet (100 mg total) by mouth 2 (two) times daily.   losartan 100 MG tablet Commonly known as:  COZAAR Take 1 tablet (100 mg total) by mouth at bedtime.   multivitamin with minerals Tabs tablet Take 1 tablet by mouth daily.   sodium bicarbonate 650 MG tablet Take 1 tablet (650 mg total) by mouth 3 (three) times daily.   torsemide 20 MG tablet Commonly known as:  DEMADEX Take 2 tablets (40 mg total) by mouth daily as needed (Leg Edema.).         DISCHARGE INSTRUCTIONS:   DIET:  Cardiac diet and Renal diet  DISCHARGE CONDITION:  Stable  ACTIVITY:  Activity as tolerated  OXYGEN:  Home Oxygen: No.   Oxygen Delivery: room air  DISCHARGE LOCATION:  home   If you experience worsening of your admission symptoms, develop shortness of breath, life threatening emergency, suicidal or homicidal thoughts you must seek medical attention immediately by calling 911 or calling your MD immediately  if symptoms less severe.  You Must read complete instructions/literature along with all the possible adverse reactions/side effects for all the Medicines you take and that have been prescribed to you. Take any new Medicines after you have completely understood and accpet all the possible adverse reactions/side effects.   Please  note  You were cared for by a hospitalist during your hospital stay. If you have any questions about your discharge medications or the care you received while you were in the hospital after you are discharged, you can call the unit and asked to speak with the hospitalist on call if the hospitalist that took care of you is not available. Once you are discharged, your primary care physician will handle any further medical issues. Please note that NO REFILLS for any discharge medications will be authorized once you are discharged, as it is imperative that you return to your primary care physician (or establish a relationship with a primary care physician if you do not have one) for your aftercare needs so that they can reassess your need for medications and monitor your lab values.     Today   Mental status much improved since yesterday.  Patient is very anxious to go home.  His mittens are off and he is less agitated and not pulling at things.  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  Will discharge home today after further discussion with patient  and patient's family.  VITAL SIGNS:  Blood pressure 106/65, pulse 68, temperature 98.6 F (37 C), temperature source Oral, resp. rate 18, height 6' (1.829 m), weight 76.5 kg, SpO2 97 %.  I/O:    Intake/Output Summary (Last 24 hours) at 04/17/2019 1549 Last data filed at 04/17/2019 1228 Gross per 24 hour  Intake 360 ml  Output 300 ml  Net 60 ml    PHYSICAL EXAMINATION:   GENERAL:  63 y.o.-year-old patient lying in bed confused with mitts on in NAD.  EYES: Pupils equal, round, reactive to light and accommodation. No scleral icterus. Extraocular muscles intact.  HEENT: Head atraumatic, normocephalic. Oropharynx and nasopharynx clear.  NECK:  Supple, no jugular venous distention. No thyroid enlargement, no tenderness.  LUNGS: Normal breath sounds bilaterally, no wheezing, rales, rhonchi. No use of accessory  muscles of respiration.  CARDIOVASCULAR: S1, S2 normal. No murmurs, rubs, or gallops.  ABDOMEN: Soft, nontender, nondistended. Bowel sounds present. No organomegaly or mass.  EXTREMITIES: No cyanosis, clubbing or edema b/l.    NEUROLOGIC: Cranial nerves II through XII are intact. No focal Motor or sensory deficits b/l. Globally weak.    PSYCHIATRIC: The patient is alert and oriented x 2.  SKIN: No obvious rash, lesion, or ulcer.   Right Chest wall perm-cath in place.   DATA REVIEW:   CBC Recent Labs  Lab 04/16/19 0930  WBC 7.6  HGB 7.2*  HCT 22.9*  PLT 281    Chemistries  Recent Labs  Lab 04/12/19 0926  04/16/19 0930  NA 138   < > 138  K 5.2*   < > 3.6  CL 105   < > 98  CO2 15*   < > 29  GLUCOSE 113*   < > 119*  BUN 115*   < > 44*  CREATININE 14.93*   < > 8.54*  CALCIUM 7.4*   < > 8.0*  AST 16  --   --   ALT 17  --   --   ALKPHOS 47  --   --   BILITOT 0.5  --   --    < > = values in this interval not displayed.    Cardiac Enzymes Recent Labs  Lab 04/12/19 0926  TROPONINI <0.03    Microbiology Results  Results for orders placed or performed during the hospital encounter of 04/12/19  SARS Coronavirus 2 (CEPHEID- Performed in [facility] hospital lab), Hosp Order     Status: None   Collection Time: 04/12/19  9:26 AM  Result Value Ref Range Status   SARS Coronavirus 2 NEGATIVE NEGATIVE Final    Comment: (NOTE) If result is NEGATIVE SARS-CoV-2 target nucleic acids are NOT DETECTED. The SARS-CoV-2 RNA is generally detectable in upper and lower  respiratory specimens during the acute phase of infection. The lowest  concentration of SARS-CoV-2 viral copies this assay can detect is 250  copies / mL. A negative result does not preclude SARS-CoV-2 infection  and should not be used as the sole basis for treatment or other  patient management decisions.  A negative result may occur with  improper specimen collection / handling, submission of specimen other  than  nasopharyngeal swab, presence of viral mutation(s) within the  areas targeted by this assay, and inadequate number of viral copies  (<250 copies / mL). A negative result must be combined with clinical  observations, patient history, and epidemiological information. If result is POSITIVE SARS-CoV-2 target nucleic acids are DETECTED. The SARS-CoV-2 RNA is generally  detectable in upper and lower  respiratory specimens dur ing the acute phase of infection.  Positive  results are indicative of active infection with SARS-CoV-2.  Clinical  correlation with patient history and other diagnostic information is  necessary to determine patient infection status.  Positive results do  not rule out bacterial infection or co-infection with other viruses. If result is PRESUMPTIVE POSTIVE SARS-CoV-2 nucleic acids MAY BE PRESENT.   A presumptive positive result was obtained on the submitted specimen  and confirmed on repeat testing.  While 2019 novel coronavirus  (SARS-CoV-2) nucleic acids may be present in the submitted sample  additional confirmatory testing may be necessary for epidemiological  and / or clinical management purposes  to differentiate between  SARS-CoV-2 and other Sarbecovirus currently known to infect humans.  If clinically indicated additional testing with an alternate test  methodology [phone]) is advised. The SARS-CoV-2 RNA is generally  detectable in upper and lower respiratory sp ecimens during the acute  phase of infection. The expected result is Negative. Fact Sheet for Patients:  StrictlyIdeas.no Fact Sheet for Healthcare Providers: BankingDealers.co.za This test is not yet approved or cleared by the [place] FDA and has been authorized for detection and/or diagnosis of SARS-CoV-2 by FDA under an Emergency Use Authorization (EUA).  This EUA will remain in effect (meaning this test can be used) for the duration of  the COVID-19 declaration under Section 564(b)(1) of the Act, 21 U.S.C. section 360bbb-3(b)(1), unless the authorization is terminated or revoked sooner. Performed at [facility], [address] [place], [place] [address]   MRSA PCR Screening     Status: None   Collection Time: 04/12/19  4:56 PM  Result Value Ref Range Status   MRSA by PCR NEGATIVE NEGATIVE Final    Comment:        The GeneXpert MRSA Assay (FDA approved for NASAL specimens only), is one component of a comprehensive MRSA colonization surveillance program. It is not intended to diagnose MRSA infection nor to guide or monitor treatment for MRSA infections. Performed at [facility], [address] [place], [place] [address]     RADIOLOGY:  No results found.    Management plans discussed with the patient, family and they are in agreement.  CODE STATUS:     Code Status Orders  (From admission, onward)         Start     Ordered   04/12/19 1647  Full code  Continuous     04/12/19 1646       TOTAL TIME TAKING CARE OF THIS PATIENT: 40 minutes.    Henreitta Leber M.D on 04/17/2019 at 3:49 PM  Between 7am to 6pm - Pager - [phone]  After 6pm go to [email or web address] - Proofreader  [facility]  Office  [phone]  CC: Primary care physician; Venia Carbon, MD

## 2019-04-17 NOTE — Progress Notes (Signed)
Vitals taken

## 2019-04-17 NOTE — Progress Notes (Signed)
Pt walked to restroom with a walker

## 2019-04-17 NOTE — Progress Notes (Signed)
Physical therapy in room walking pt

## 2019-04-18 ENCOUNTER — Telehealth: Payer: Self-pay

## 2019-04-18 DIAGNOSIS — N186 End stage renal disease: Secondary | ICD-10-CM | POA: Diagnosis not present

## 2019-04-18 DIAGNOSIS — Z992 Dependence on renal dialysis: Secondary | ICD-10-CM | POA: Diagnosis not present

## 2019-04-18 NOTE — Telephone Encounter (Signed)
Attempted to reach patient to complete TCM and schedule hospital f/u appt. Left message with contact info.

## 2019-04-19 NOTE — Telephone Encounter (Signed)
Per spouse, patient has tremors, generalized weakness, drooling, and insomnia. Per report, patient began dialysis on 04/18/19. Per spouse, she is contacting Dr. Holley Raring to address questions and concerns about patient's health.

## 2019-04-21 NOTE — Telephone Encounter (Signed)
Spoke to pt's wife. She said he is doing much better today.

## 2019-04-21 NOTE — Telephone Encounter (Signed)
Please check to see how he is doing

## 2019-04-25 ENCOUNTER — Other Ambulatory Visit: Payer: Self-pay | Admitting: Internal Medicine

## 2019-04-26 NOTE — Telephone Encounter (Signed)
This would have to be decided by his nephrologist Not sure he needs this now since he is being dialyzed

## 2019-04-26 NOTE — Telephone Encounter (Signed)
Last written by a different provider. Wanted to make sure it was still a good medication for him

## 2019-05-23 DIAGNOSIS — T829XXA Unspecified complication of cardiac and vascular prosthetic device, implant and graft, initial encounter: Secondary | ICD-10-CM | POA: Insufficient documentation

## 2019-06-07 ENCOUNTER — Telehealth: Payer: Self-pay | Admitting: Internal Medicine

## 2019-06-07 NOTE — Telephone Encounter (Signed)
It would probably be best to discuss this with Dr Canary Brim he will need to transfer his dialysis center, etc

## 2019-06-07 NOTE — Telephone Encounter (Signed)
Daughter Gilford Rile called wanting to get a new kidney dr in [place].   Pt see's dr Gita Kudo in [place]   Best number [phone]  [place]

## 2019-06-07 NOTE — Telephone Encounter (Addendum)
Spoke to pt's wife. She said she was unaware that the daughter had called. The family had discussed a change. She has already been looking in to it. They had discussed this with Dr Holley Raring about the logistics.   We had to move his appt from 7-20 to 7-24

## 2019-06-20 ENCOUNTER — Ambulatory Visit: Payer: 59 | Admitting: Internal Medicine

## 2019-06-24 ENCOUNTER — Encounter: Payer: Self-pay | Admitting: Internal Medicine

## 2019-06-24 ENCOUNTER — Other Ambulatory Visit: Payer: Self-pay

## 2019-06-24 ENCOUNTER — Ambulatory Visit (INDEPENDENT_AMBULATORY_CARE_PROVIDER_SITE_OTHER): Payer: 59 | Admitting: Internal Medicine

## 2019-06-24 DIAGNOSIS — R4189 Other symptoms and signs involving cognitive functions and awareness: Secondary | ICD-10-CM | POA: Diagnosis not present

## 2019-06-24 DIAGNOSIS — N186 End stage renal disease: Secondary | ICD-10-CM

## 2019-06-24 DIAGNOSIS — I10 Essential (primary) hypertension: Secondary | ICD-10-CM

## 2019-06-24 NOTE — Assessment & Plan Note (Signed)
He has some psychomotor retardation and paucity of involvement now---not really better with the dialysis Functionally he seems to be doing okay though

## 2019-06-24 NOTE — Progress Notes (Signed)
Subjective:    Patient ID: Devin Harmon, male    DOB: 01-02-1956, 63 y.o.   MRN: [id number]  HPI Virtual visit for follow up of renal failure and HTN Identification done Reviewed billing and he gave consent  He is in his home with wife---who manages most of the visit  Going to evening dialysis 3 days a week---better for wife's schedule Tolerating this well Still with Dr Holley Raring Still on the bicarbonate twice a day---discussed that they should be able to stop it Evaluated peritoneal dialysis--but decided to hold off on this Being evaluated for related donor kidney--has 3 daughters  Able to work outside again No SOB or chest pain  Was taken off the amlodipine for a bit BP went up some so wife restarted the amlodipine Wife monitors--usually 270J-500'X systolic Her machine was higher than here  Current Outpatient Medications on File Prior to Visit  Medication Sig Dispense Refill  . acetaminophen (TYLENOL) 500 MG tablet Take 500-1,000 mg by mouth every 6 (six) hours as needed for mild pain.    . cetirizine (ZYRTEC) 10 MG tablet Take 10 mg by mouth every evening.     . fluticasone (FLONASE) 50 MCG/ACT nasal spray Place 1-2 sprays into both nostrils daily.    Marland Kitchen labetalol (NORMODYNE) 100 MG tablet Take 1 tablet (100 mg total) by mouth 2 (two) times daily. 60 tablet 11  . Multiple Vitamin (MULTIVITAMIN WITH MINERALS) TABS tablet Take 1 tablet by mouth daily.    . sodium bicarbonate 650 MG tablet Take 1 tablet (650 mg total) by mouth 3 (three) times daily. 270 tablet 3  . losartan (COZAAR) 100 MG tablet Take 1 tablet (100 mg total) by mouth at bedtime. 30 tablet 1   No current facility-administered medications on file prior to visit.     Allergies  Allergen Reactions  . Chlorthalidone     REACTION: felt "bad" and ED  . Hydrocodone Other (See Comments)    Nausea and dizzy  . Sulfonamide Derivatives Hives    REACTION: unspecified    Past Medical History:  Diagnosis Date  .  Allergy   . BPH (benign prostatic hypertrophy)   . Hypertension   . Renal insufficiency   . Zoster 2007   facial, hospital 2007    Past Surgical History:  Procedure Laterality Date  . DIALYSIS/PERMA CATHETER INSERTION N/A 04/15/2019   Procedure: DIALYSIS/PERMA CATHETER INSERTION;  Surgeon: Katha Cabal, MD;  Location: [facility];  Service: Cardiovascular;  Laterality: N/A;    Family History  Problem Relation Age of Onset  . Hypertension Mother   . Cancer Mother        lung cancer  . Hypertension Brother   . Heart disease Father   . Coronary artery disease Neg Hx   . Diabetes Neg Hx     Social History   Socioeconomic History  . Marital status: Married    Spouse name: Not on file  . Number of children: 2  . Years of education: Not on file  . Highest education level: Not on file  Occupational History  . Occupation: Museum/gallery curator, building new homes  Social Needs  . Financial resource strain: Not on file  . Food insecurity    Worry: Not on file    Inability: Not on file  . Transportation needs    Medical: Not on file    Non-medical: Not on file  Tobacco Use  . Smoking status: Never Smoker  . Smokeless tobacco: Never Used  Substance and Sexual Activity  . Alcohol use: Yes    Comment: occasional  . Drug use: Not Currently  . Sexual activity: Not on file  Lifestyle  . Physical activity    Days per week: Not on file    Minutes per session: Not on file  . Stress: Not on file  Relationships  . Social Herbalist on phone: Not on file    Gets together: Not on file    Attends religious service: Not on file    Active member of club or organization: Not on file    Attends meetings of clubs or organizations: Not on file    Relationship status: Not on file  . Intimate partner violence    Fear of current or ex partner: Not on file    Emotionally abused: Not on file    Physically abused: Not on file    Forced sexual activity: Not on file  Other  Topics Concern  . Not on file  Social History Narrative  . Not on file   Review of Systems Appetite is fine Weight is stable--does get weighed at dialysis Sleeping okay    Objective:   Physical Exam  Constitutional: He appears well-developed. No distress.  Respiratory: Effort normal. No respiratory distress.  Psychiatric:  He remains very passive but will answer direct questions with brief answers           Assessment & Plan:

## 2019-06-24 NOTE — Assessment & Plan Note (Signed)
BP Readings from Last 3 Encounters:  06/24/19 (!) 166/96  04/17/19 106/65  04/04/19 130/72   Still high at home They don't know what he has been running at the dialysis center but is better at home back on the amlodipine. I asked wife to bring in her machine to correlate with measurements at dialysis We discussed the need for good BP control in view of hopes for transplant May need to be more aggressive with Rx Will send note to Dr Holley Raring for his review

## 2019-06-24 NOTE — Assessment & Plan Note (Signed)
Has adjusted to hemodialysis Looking towards live related donor transplant from one of his daughters I told them he shouldn't need the bicarb anymore but to check with Dr Holley Raring to be sure

## 2019-07-29 ENCOUNTER — Other Ambulatory Visit: Payer: Self-pay

## 2019-07-29 DIAGNOSIS — N186 End stage renal disease: Secondary | ICD-10-CM

## 2019-08-01 ENCOUNTER — Telehealth (HOSPITAL_COMMUNITY): Payer: Self-pay | Admitting: Rehabilitation

## 2019-08-01 NOTE — Telephone Encounter (Signed)

## 2019-08-02 ENCOUNTER — Other Ambulatory Visit: Payer: Self-pay | Admitting: *Deleted

## 2019-08-02 ENCOUNTER — Ambulatory Visit (INDEPENDENT_AMBULATORY_CARE_PROVIDER_SITE_OTHER)
Admission: RE | Admit: 2019-08-02 | Discharge: 2019-08-02 | Disposition: A | Discharge status: Home / Self Care | Payer: 59 | Source: Ambulatory Visit | Attending: Vascular Surgery | Admitting: Vascular Surgery

## 2019-08-02 ENCOUNTER — Ambulatory Visit (HOSPITAL_COMMUNITY)
Admission: RE | Admit: 2019-08-02 | Discharge: 2019-08-02 | Disposition: A | Discharge status: Home / Self Care | Payer: 59 | Source: Ambulatory Visit | Attending: Vascular Surgery | Admitting: Vascular Surgery

## 2019-08-02 ENCOUNTER — Other Ambulatory Visit: Payer: Self-pay

## 2019-08-02 ENCOUNTER — Encounter: Payer: Self-pay | Admitting: Vascular Surgery

## 2019-08-02 ENCOUNTER — Encounter: Payer: Self-pay | Admitting: *Deleted

## 2019-08-02 ENCOUNTER — Ambulatory Visit: Payer: 59 | Admitting: Vascular Surgery

## 2019-08-02 VITALS — BP 188/106 | HR 71 | Temp 97.8°F | Resp 18 | Ht 69.0 in | Wt 170.0 lb

## 2019-08-02 DIAGNOSIS — N186 End stage renal disease: Secondary | ICD-10-CM

## 2019-08-02 NOTE — Progress Notes (Signed)
Patient name: Devin Harmon MRN: [id number] DOB: 04-Sep-1956 Sex: male  REASON FOR CONSULT: New AVF access evaluation  HPI: Devin Harmon is a 63 y.o. male, with history of renal failure due to hypertension who presents for new dialysis access evaluation.  Patient had a tunneled dialysis catheter placed at [facility] on 04/15/2019 by Dr. Delana Meyer.  He has been undergoing dialysis Monday Wednesday Friday.  Patient is here with his wife today and is unclear about any chance of recovery of his renal function.  Wants to be evaluated for transplant.  He is left-handed.  He has never had any access placed before.  He does deer hunt and runs dogs and his left hand is important.  His catheter has been working fine without any issues.  He does not take any blood thinners.  No other chest wall implants or ICDs.  Past Medical History:  Diagnosis Date  . Allergy   . BPH (benign prostatic hypertrophy)   . Hypertension   . Renal insufficiency   . Zoster 2007   facial, hospital 2007    Past Surgical History:  Procedure Laterality Date  . DIALYSIS/PERMA CATHETER INSERTION N/A 04/15/2019   Procedure: DIALYSIS/PERMA CATHETER INSERTION;  Surgeon: Katha Cabal, MD;  Location: [facility] [place] [facility];  Service: Cardiovascular;  Laterality: N/A;    Family History  Problem Relation Age of Onset  . Hypertension Mother   . Cancer Mother        lung cancer  . Hypertension Brother   . Heart disease Father   . Coronary artery disease Neg Hx   . Diabetes Neg Hx     SOCIAL HISTORY: Social History   Socioeconomic History  . Marital status: Married    Spouse name: Not on file  . Number of children: 2  . Years of education: Not on file  . Highest education level: Not on file  Occupational History  . Occupation: Museum/gallery curator, building new homes  Social Needs  . Financial resource strain: Not on file  . Food insecurity    Worry: Not on file    Inability: Not on file  . Transportation needs   Medical: Not on file    Non-medical: Not on file  Tobacco Use  . Smoking status: Never Smoker  . Smokeless tobacco: Never Used  Substance and Sexual Activity  . Alcohol use: Yes    Comment: occasional  . Drug use: Not Currently  . Sexual activity: Not on file  Lifestyle  . Physical activity    Days per week: Not on file    Minutes per session: Not on file  . Stress: Not on file  Relationships  . Social Herbalist on phone: Not on file    Gets together: Not on file    Attends religious service: Not on file    Active member of club or organization: Not on file    Attends meetings of clubs or organizations: Not on file    Relationship status: Not on file  . Intimate partner violence    Fear of current or ex partner: Not on file    Emotionally abused: Not on file    Physically abused: Not on file    Forced sexual activity: Not on file  Other Topics Concern  . Not on file  Social History Narrative  . Not on file    Allergies  Allergen Reactions  . Chlorthalidone Other (See Comments)    REACTION: felt "bad"  and ED REACTION: felt "bad" and ED  . Hydrocodone Other (See Comments)    Nausea and dizzy  . Sulfa Antibiotics Hives    REACTION: unspecified  . Sulfonamide Derivatives Hives    REACTION: unspecified    Current Outpatient Medications  Medication Sig Dispense Refill  . acetaminophen (TYLENOL) 500 MG tablet Take 500-1,000 mg by mouth every 6 (six) hours as needed for mild pain.    Marland Kitchen amLODipine (NORVASC) 10 MG tablet Take 10 mg by mouth daily.    . B Complex-C-Folic Acid (RENA-VITE RX) 1 MG TABS Take 1 tablet by mouth daily.    . cetirizine (ZYRTEC) 10 MG tablet Take 10 mg by mouth every evening.     . fluticasone (FLONASE) 50 MCG/ACT nasal spray Place 1-2 sprays into both nostrils daily.    Marland Kitchen labetalol (NORMODYNE) 100 MG tablet Take 1 tablet (100 mg total) by mouth 2 (two) times daily. 60 tablet 11  . Multiple Vitamin (MULTIVITAMIN WITH MINERALS) TABS  tablet Take 1 tablet by mouth daily.    . sodium bicarbonate 650 MG tablet Take 1 tablet (650 mg total) by mouth 3 (three) times daily. 270 tablet 3  . AURYXIA 1 GM 210 MG(Fe) tablet TAKE 2 TABLETS BY MOUTH THREE TIMES DAILY WITH MEALS.    Marland Kitchen losartan (COZAAR) 100 MG tablet Take 1 tablet (100 mg total) by mouth at bedtime. 30 tablet 1   No current facility-administered medications for this visit.     REVIEW OF SYSTEMS:  [X]  denotes positive finding, [ ]  denotes negative finding Cardiac  Comments:  Chest pain or chest pressure:    Shortness of breath upon exertion:    Short of breath when lying flat:    Irregular heart rhythm:        Vascular    Pain in calf, thigh, or hip brought on by ambulation:    Pain in feet at night that wakes you up from your sleep:     Blood clot in your veins:    Leg swelling:         Pulmonary    Oxygen at home:    Productive cough:     Wheezing:         Neurologic    Sudden weakness in arms or legs:     Sudden numbness in arms or legs:     Sudden onset of difficulty speaking or slurred speech:    Temporary loss of vision in one eye:     Problems with dizziness:         Gastrointestinal    Blood in stool:     Vomited blood:         Genitourinary    Burning when urinating:     Blood in urine:        Psychiatric    Major depression:         Hematologic    Bleeding problems:    Problems with blood clotting too easily:        Skin    Rashes or ulcers:        Constitutional    Fever or chills:      PHYSICAL EXAM: Vitals:   08/02/19 1024  BP: (!) 188/106  Pulse: 71  Resp: 18  Temp: 97.8 F (36.6 C)  TempSrc: Temporal  SpO2: 98%  Weight: 170 lb (77.1 kg)  Height: 5\' 9"  (1.753 m)    GENERAL: The patient is a well-nourished male, in no acute distress. The  vital signs are documented above. CARDIAC: There is a regular rate and rhythm.  VASCULAR:  2+ palpable radial and brachial pulses bilateral upper extremities PULMONARY: There  is good air exchange bilaterally without wheezing or rales. ABDOMEN: Soft and non-tender with normal pitched bowel sounds.  MUSCULOSKELETAL: There are no major deformities or cyanosis. NEUROLOGIC: No focal weakness or paresthesias are detected. SKIN: There are no ulcers or rashes noted. PSYCHIATRIC: The patient has a normal affect.  DATA:   Upper extremity arterial duplex shows triphasic waveforms in both upper extremities  Upper extremity vein mapping shows nice basilic and cephalic vein in the right upper extremity  Assessment/Plan:  63 year old male now with end-stage renal failure secondary to hypertension that presents for evaluation of new access with plans for AV fistula.  He currently has a right IJ tunneled dialysis catheter placed at [facility] that is working well.  Given that he is left-handed discussed plan for right arm AV fistula.  He has a very nice cephalic vein at the antecubital region and I think he will be a good candidate for brachiocephalic AV fistula.  I discussed risk and benefits of fistula placement in detail with family including risk of infection, bleeding, steal, failure to mature, etc.  We will plan to schedule on a Thursday which is a nondialysis day since he is currently dialyzing on Monday Wednesday and Friday.   Marty Heck, MD [facility] [place] Office: [phone] Pager: [phone]

## 2019-08-23 ENCOUNTER — Other Ambulatory Visit (HOSPITAL_COMMUNITY)
Admission: RE | Admit: 2019-08-23 | Discharge: 2019-08-23 | Disposition: A | Discharge status: Home / Self Care | Payer: 59 | Source: Ambulatory Visit | Attending: Vascular Surgery | Admitting: Vascular Surgery

## 2019-08-23 DIAGNOSIS — Z01812 Encounter for preprocedural laboratory examination: Secondary | ICD-10-CM | POA: Insufficient documentation

## 2019-08-23 DIAGNOSIS — Z20828 Contact with and (suspected) exposure to other viral communicable diseases: Secondary | ICD-10-CM | POA: Diagnosis not present

## 2019-08-23 LAB — SARS CORONAVIRUS 2 (TAT 6-24 HRS): SARS Coronavirus 2: NEGATIVE

## 2019-08-25 ENCOUNTER — Encounter (HOSPITAL_COMMUNITY): Payer: Self-pay

## 2019-08-25 ENCOUNTER — Encounter (HOSPITAL_COMMUNITY)
Admission: RE | Disposition: A | Discharge status: Home / Self Care | Payer: Self-pay | Source: Home / Self Care | Attending: Vascular Surgery

## 2019-08-25 ENCOUNTER — Ambulatory Visit (HOSPITAL_COMMUNITY)
Admission: RE | Admit: 2019-08-25 | Discharge: 2019-08-25 | Disposition: A | Discharge status: Home / Self Care | Payer: 59 | Attending: Vascular Surgery | Admitting: Vascular Surgery

## 2019-08-25 ENCOUNTER — Other Ambulatory Visit: Payer: Self-pay

## 2019-08-25 DIAGNOSIS — Z888 Allergy status to other drugs, medicaments and biological substances status: Secondary | ICD-10-CM | POA: Insufficient documentation

## 2019-08-25 DIAGNOSIS — N186 End stage renal disease: Secondary | ICD-10-CM | POA: Diagnosis not present

## 2019-08-25 DIAGNOSIS — Z992 Dependence on renal dialysis: Secondary | ICD-10-CM | POA: Diagnosis not present

## 2019-08-25 DIAGNOSIS — Z79899 Other long term (current) drug therapy: Secondary | ICD-10-CM | POA: Diagnosis not present

## 2019-08-25 DIAGNOSIS — Z801 Family history of malignant neoplasm of trachea, bronchus and lung: Secondary | ICD-10-CM | POA: Diagnosis not present

## 2019-08-25 DIAGNOSIS — Z882 Allergy status to sulfonamides status: Secondary | ICD-10-CM | POA: Diagnosis not present

## 2019-08-25 DIAGNOSIS — Z885 Allergy status to narcotic agent status: Secondary | ICD-10-CM | POA: Insufficient documentation

## 2019-08-25 DIAGNOSIS — Z5309 Procedure and treatment not carried out because of other contraindication: Secondary | ICD-10-CM | POA: Insufficient documentation

## 2019-08-25 DIAGNOSIS — N400 Enlarged prostate without lower urinary tract symptoms: Secondary | ICD-10-CM | POA: Diagnosis not present

## 2019-08-25 DIAGNOSIS — Z8249 Family history of ischemic heart disease and other diseases of the circulatory system: Secondary | ICD-10-CM | POA: Diagnosis not present

## 2019-08-25 DIAGNOSIS — I120 Hypertensive chronic kidney disease with stage 5 chronic kidney disease or end stage renal disease: Secondary | ICD-10-CM | POA: Insufficient documentation

## 2019-08-25 HISTORY — DX: Anemia, unspecified: D64.9

## 2019-08-25 LAB — POCT I-STAT, CHEM 8
BUN: 28 mg/dL — ABNORMAL HIGH (ref 8–23)
Calcium, Ion: 1.04 mmol/L — ABNORMAL LOW (ref 1.15–1.40)
Chloride: 102 mmol/L (ref 98–111)
Creatinine, Ser: 7 mg/dL — ABNORMAL HIGH (ref 0.61–1.24)
Glucose, Bld: 91 mg/dL (ref 70–99)
HCT: 37 % — ABNORMAL LOW (ref 39.0–52.0)
Hemoglobin: 12.6 g/dL — ABNORMAL LOW (ref 13.0–17.0)
Potassium: 4.1 mmol/L (ref 3.5–5.1)
Sodium: 140 mmol/L (ref 135–145)
TCO2: 26 mmol/L (ref 22–32)

## 2019-08-25 SURGERY — ARTERIOVENOUS (AV) FISTULA CREATION
Anesthesia: Monitor Anesthesia Care | Laterality: Right

## 2019-08-25 MED ORDER — HYDRALAZINE HCL 20 MG/ML IJ SOLN
INTRAMUSCULAR | Status: AC
  Administered 2019-08-25: 09:00:00 10 mg via INTRAVENOUS
  Filled 2019-08-25: qty 1

## 2019-08-25 MED ORDER — DEXAMETHASONE SODIUM PHOSPHATE 10 MG/ML IJ SOLN
INTRAMUSCULAR | Status: AC
  Filled 2019-08-25: qty 1

## 2019-08-25 MED ORDER — AMLODIPINE BESYLATE 5 MG PO TABS
10.0000 mg | ORAL_TABLET | Freq: Every day | ORAL | Status: DC
  Administered 2019-08-25: 10 mg via ORAL
  Filled 2019-08-25: qty 2

## 2019-08-25 MED ORDER — LIDOCAINE 2% (20 MG/ML) 5 ML SYRINGE
INTRAMUSCULAR | Status: AC
  Filled 2019-08-25: qty 5

## 2019-08-25 MED ORDER — SODIUM CHLORIDE 0.9 % IV SOLN
INTRAVENOUS | Status: DC
  Administered 2019-09-01: 09:00:00 via INTRAVENOUS

## 2019-08-25 MED ORDER — ONDANSETRON HCL 4 MG/2ML IJ SOLN
INTRAMUSCULAR | Status: AC
  Filled 2019-08-25: qty 2

## 2019-08-25 MED ORDER — HYDRALAZINE HCL 20 MG/ML IJ SOLN
10.0000 mg | Freq: Once | INTRAMUSCULAR | Status: AC
  Administered 2019-08-25: 09:00:00 10 mg via INTRAVENOUS

## 2019-08-25 MED ORDER — PROPOFOL 10 MG/ML IV BOLUS
INTRAVENOUS | Status: AC
  Filled 2019-08-25: qty 20

## 2019-08-25 MED ORDER — FENTANYL CITRATE (PF) 250 MCG/5ML IJ SOLN
INTRAMUSCULAR | Status: AC
  Filled 2019-08-25: qty 5

## 2019-08-25 MED ORDER — CEFAZOLIN SODIUM-DEXTROSE 2-4 GM/100ML-% IV SOLN
2.0000 g | INTRAVENOUS | Status: DC
  Administered 2019-09-01: 10:00:00 2 g via INTRAVENOUS

## 2019-08-25 MED ORDER — PROPOFOL 1000 MG/100ML IV EMUL
INTRAVENOUS | Status: AC
  Filled 2019-08-25: qty 100

## 2019-08-25 MED ORDER — SODIUM CHLORIDE 0.9 % IV SOLN
INTRAVENOUS | Status: AC
  Filled 2019-08-25: qty 1.2

## 2019-08-25 SURGICAL SUPPLY — 32 items
ARMBAND PINK RESTRICT EXTREMIT (MISCELLANEOUS) ×6 IMPLANT
CANISTER SUCT 3000ML PPV (MISCELLANEOUS) ×3 IMPLANT
CLIP VESOCCLUDE MED 6/CT (CLIP) ×3 IMPLANT
CLIP VESOCCLUDE SM WIDE 6/CT (CLIP) ×3 IMPLANT
COVER PROBE W GEL 5X96 (DRAPES) ×3 IMPLANT
COVER WAND RF STERILE (DRAPES) ×3 IMPLANT
DECANTER SPIKE VIAL GLASS SM (MISCELLANEOUS) ×3 IMPLANT
DERMABOND ADVANCED (GAUZE/BANDAGES/DRESSINGS) ×2
DERMABOND ADVANCED .7 DNX12 (GAUZE/BANDAGES/DRESSINGS) ×1 IMPLANT
ELECT REM PT RETURN 9FT ADLT (ELECTROSURGICAL) ×3
ELECTRODE REM PT RTRN 9FT ADLT (ELECTROSURGICAL) ×1 IMPLANT
GLOVE BIO SURGEON STRL SZ7.5 (GLOVE) ×3 IMPLANT
GLOVE BIOGEL PI IND STRL 8 (GLOVE) ×1 IMPLANT
GLOVE BIOGEL PI INDICATOR 8 (GLOVE) ×2
GOWN STRL REUS W/ TWL LRG LVL3 (GOWN DISPOSABLE) ×2 IMPLANT
GOWN STRL REUS W/ TWL XL LVL3 (GOWN DISPOSABLE) ×2 IMPLANT
GOWN STRL REUS W/TWL LRG LVL3 (GOWN DISPOSABLE) ×4
GOWN STRL REUS W/TWL XL LVL3 (GOWN DISPOSABLE) ×4
HEMOSTAT SPONGE AVITENE ULTRA (HEMOSTASIS) IMPLANT
KIT BASIN OR (CUSTOM PROCEDURE TRAY) ×3 IMPLANT
KIT TURNOVER KIT B (KITS) ×3 IMPLANT
NS IRRIG 1000ML POUR BTL (IV SOLUTION) ×3 IMPLANT
PACK CV ACCESS (CUSTOM PROCEDURE TRAY) ×3 IMPLANT
PAD ARMBOARD 7.5X6 YLW CONV (MISCELLANEOUS) ×6 IMPLANT
SUT MNCRL AB 4-0 PS2 18 (SUTURE) ×3 IMPLANT
SUT PROLENE 6 0 BV (SUTURE) ×3 IMPLANT
SUT PROLENE 7 0 BV 1 (SUTURE) IMPLANT
SUT VIC AB 3-0 SH 27 (SUTURE) ×2
SUT VIC AB 3-0 SH 27X BRD (SUTURE) ×1 IMPLANT
TOWEL GREEN STERILE (TOWEL DISPOSABLE) ×3 IMPLANT
UNDERPAD 30X30 (UNDERPADS AND DIAPERS) ×3 IMPLANT
WATER STERILE IRR 1000ML POUR (IV SOLUTION) ×3 IMPLANT

## 2019-08-25 NOTE — OR Nursing (Signed)
Patient Canceled in Pre-OP

## 2019-08-25 NOTE — Progress Notes (Signed)
Dr. Fransisco Beau notified of elevated BP.  Amlodipine ordered and given.  Will continue to monitor.

## 2019-08-25 NOTE — Anesthesia Preprocedure Evaluation (Deleted)
Anesthesia Evaluation  Patient identified by MRN, date of birth, ID band Patient awake    Reviewed: Allergy & Precautions, NPO status , Patient's Chart, lab work & pertinent test results, reviewed documented beta blocker date and time   History of Anesthesia Complications Negative for: history of anesthetic complications  Airway Mallampati: III  TM Distance: >3 FB Neck ROM: Full    Dental  (+) Dental Advisory Given, Poor Dentition   Pulmonary neg pulmonary ROS,    Pulmonary exam normal        Cardiovascular hypertension, Pt. on medications and Pt. on home beta blockers Normal cardiovascular exam     Neuro/Psych negative neurological ROS  negative psych ROS   GI/Hepatic negative GI ROS, Neg liver ROS,   Endo/Other  negative endocrine ROS  Renal/GU ESRF and DialysisRenal disease   BPH     Musculoskeletal negative musculoskeletal ROS (+)   Abdominal   Peds  Hematology  (+) anemia ,   Anesthesia Other Findings   Reproductive/Obstetrics                            Anesthesia Physical Anesthesia Plan  ASA: III  Anesthesia Plan: MAC   Post-op Pain Management:    Induction: Intravenous  PONV Risk Score and Plan: 1 and Propofol infusion and Treatment may vary due to age or medical condition  Airway Management Planned: Natural Airway and Simple Face Mask  Additional Equipment: None  Intra-op Plan:   Post-operative Plan:   Informed Consent: I have reviewed the patients History and Physical, chart, labs and discussed the procedure including the risks, benefits and alternatives for the proposed anesthesia with the patient or authorized representative who has indicated his/her understanding and acceptance.       Plan Discussed with: CRNA and Anesthesiologist  Anesthesia Plan Comments:        Anesthesia Quick Evaluation

## 2019-08-25 NOTE — Progress Notes (Addendum)
Dr. Fransisco Beau and Dr. Carlis Abbott aware of elevated BP's.  Order received from Dr. Fransisco Beau to cancel procedure and discharge patient to home.  Patient to follow up with PCP.  IV removed, clean dry and intact.

## 2019-08-25 NOTE — Progress Notes (Signed)
63 year old male scheduled for right arm AV fistula today.  His preop blood pressure has been 200/115-130 consistently.  He has been given his amlodipine as well as additional PRN's.  Anesthesia does not feel it is safe to proceed.  He has a catheter for immediate dialysis needs.  I will attempt to reschedule him for next Thursday.  I asked that he follow-up with his PCP and/or nephrologist to make any adjustments.  When he comes back for surgery next week, I do not think he should hold any of his medications.  He held his amlodipine this morning.  Marty Heck, MD [facility] [place] Office: [phone] Pager: [place]

## 2019-08-25 NOTE — H&P (Signed)
History and Physical Interval Note:  08/25/2019 9:36 AM  Devin Harmon  has presented today for surgery, with the diagnosis of end stage renal disease.  The various methods of treatment have been discussed with the patient and family. After consideration of risks, benefits and other options for treatment, the patient has consented to  Procedure(s): ARTERIOVENOUS (AV) FISTULA CREATION (Right) as a surgical intervention.  The patient's history has been reviewed, patient examined, no change in status, stable for surgery.  I have reviewed the patient's chart and labs.  Questions were answered to the patient's satisfaction.    Right arm AVF.  Devin Harmon  Patient name: Devin Harmon          MRN: [id number]        DOB: [birth year]            Sex: male  REASON FOR CONSULT: New AVF access evaluation  HPI: Devin Harmon is a 63 y.o. male, with history of renal failure due to hypertension who presents for new dialysis access evaluation.  Patient had a tunneled dialysis catheter placed at [facility] on 04/15/2019 by Dr. Delana Meyer.  He has been undergoing dialysis Monday Wednesday Friday.  Patient is here with his wife today and is unclear about any chance of recovery of his renal function.  Wants to be evaluated for transplant.  He is left-handed.  He has never had any access placed before.  He does deer hunt and runs dogs and his left hand is important.  His catheter has been working fine without any issues.  He does not take any blood thinners.  No other chest wall implants or ICDs.      Past Medical History:  Diagnosis Date  . Allergy   . BPH (benign prostatic hypertrophy)   . Hypertension   . Renal insufficiency   . Zoster 2007   facial, hospital 2007         Past Surgical History:  Procedure Laterality Date  . DIALYSIS/PERMA CATHETER INSERTION N/A 04/15/2019   Procedure: DIALYSIS/PERMA CATHETER INSERTION;  Surgeon: Katha Cabal, MD;  Location: [place] [facility];  Service: Cardiovascular;  Laterality: N/A;         Family History  Problem Relation Age of Onset  . Hypertension Mother   . Cancer Mother        lung cancer  . Hypertension Brother   . Heart disease Father   . Coronary artery disease Neg Hx   . Diabetes Neg Hx     SOCIAL HISTORY: Social History        Socioeconomic History  . Marital status: Married    Spouse name: Not on file  . Number of children: 2  . Years of education: Not on file  . Highest education level: Not on file  Occupational History  . Occupation: Museum/gallery curator, building new homes  Social Needs  . Financial resource strain: Not on file  . Food insecurity    Worry: Not on file    Inability: Not on file  . Transportation needs    Medical: Not on file    Non-medical: Not on file  Tobacco Use  . Smoking status: Never Smoker  . Smokeless tobacco: Never Used  Substance and Sexual Activity  . Alcohol use: Yes    Comment: occasional  . Drug use: Not Currently  . Sexual activity: Not on file  Lifestyle  . Physical activity    Days per week: Not on file  Minutes per session: Not on file  . Stress: Not on file  Relationships  . Social Herbalist on phone: Not on file    Gets together: Not on file    Attends religious service: Not on file    Active member of club or organization: Not on file    Attends meetings of clubs or organizations: Not on file    Relationship status: Not on file  . Intimate partner violence    Fear of current or ex partner: Not on file    Emotionally abused: Not on file    Physically abused: Not on file    Forced sexual activity: Not on file  Other Topics Concern  . Not on file  Social History Narrative  . Not on file         Allergies  Allergen Reactions  . Chlorthalidone Other (See Comments)    REACTION: felt "bad" and ED REACTION: felt "bad" and ED  . Hydrocodone Other (See Comments)    Nausea and dizzy   . Sulfa Antibiotics Hives    REACTION: unspecified  . Sulfonamide Derivatives Hives    REACTION: unspecified          Current Outpatient Medications  Medication Sig Dispense Refill  . acetaminophen (TYLENOL) 500 MG tablet Take 500-1,000 mg by mouth every 6 (six) hours as needed for mild pain.    Marland Kitchen amLODipine (NORVASC) 10 MG tablet Take 10 mg by mouth daily.    . B Complex-C-Folic Acid (RENA-VITE RX) 1 MG TABS Take 1 tablet by mouth daily.    . cetirizine (ZYRTEC) 10 MG tablet Take 10 mg by mouth every evening.     . fluticasone (FLONASE) 50 MCG/ACT nasal spray Place 1-2 sprays into both nostrils daily.    Marland Kitchen labetalol (NORMODYNE) 100 MG tablet Take 1 tablet (100 mg total) by mouth 2 (two) times daily. 60 tablet 11  . Multiple Vitamin (MULTIVITAMIN WITH MINERALS) TABS tablet Take 1 tablet by mouth daily.    . sodium bicarbonate 650 MG tablet Take 1 tablet (650 mg total) by mouth 3 (three) times daily. 270 tablet 3  . AURYXIA 1 GM 210 MG(Fe) tablet TAKE 2 TABLETS BY MOUTH THREE TIMES DAILY WITH MEALS.    Marland Kitchen losartan (COZAAR) 100 MG tablet Take 1 tablet (100 mg total) by mouth at bedtime. 30 tablet 1   No current facility-administered medications for this visit.     REVIEW OF SYSTEMS:  [X]  denotes positive finding, [ ]  denotes negative finding Cardiac  Comments:  Chest pain or chest pressure:    Shortness of breath upon exertion:    Short of breath when lying flat:    Irregular heart rhythm:        Vascular    Pain in calf, thigh, or hip brought on by ambulation:    Pain in feet at night that wakes you up from your sleep:     Blood clot in your veins:    Leg swelling:         Pulmonary    Oxygen at home:    Productive cough:     Wheezing:         Neurologic    Sudden weakness in arms or legs:     Sudden numbness in arms or legs:     Sudden onset of difficulty speaking or slurred speech:    Temporary loss of  vision in one eye:     Problems with dizziness:  Gastrointestinal    Blood in stool:     Vomited blood:         Genitourinary    Burning when urinating:     Blood in urine:        Psychiatric    Major depression:         Hematologic    Bleeding problems:    Problems with blood clotting too easily:        Skin    Rashes or ulcers:        Constitutional    Fever or chills:      PHYSICAL EXAM:    Vitals:   08/02/19 1024  BP: (!) 188/106  Pulse: 71  Resp: 18  Temp: 97.8 F (36.6 C)  TempSrc: Temporal  SpO2: 98%  Weight: 170 lb (77.1 kg)  Height: 5\' 9"  (1.753 m)    GENERAL: The patient is a well-nourished male, in no acute distress. The vital signs are documented above. CARDIAC: There is a regular rate and rhythm.  VASCULAR:  2+ palpable radial and brachial pulses bilateral upper extremities PULMONARY: There is good air exchange bilaterally without wheezing or rales. ABDOMEN: Soft and non-tender with normal pitched bowel sounds.  MUSCULOSKELETAL: There are no major deformities or cyanosis. NEUROLOGIC: No focal weakness or paresthesias are detected. SKIN: There are no ulcers or rashes noted. PSYCHIATRIC: The patient has a normal affect.  DATA:   Upper extremity arterial duplex shows triphasic waveforms in both upper extremities  Upper extremity vein mapping shows nice basilic and cephalic vein in the right upper extremity  Assessment/Plan:  63 year old male now with end-stage renal failure secondary to hypertension that presents for evaluation of new access with plans for AV fistula.  He currently has a right IJ tunneled dialysis catheter placed at [facility] that is working well.  Given that he is left-handed discussed plan for right arm AV fistula.  He has a very nice cephalic vein at the antecubital region and I think he will be a good candidate for brachiocephalic AV fistula.  I discussed risk and  benefits of fistula placement in detail with family including risk of infection, bleeding, steal, failure to mature, etc.  We will plan to schedule on a Thursday which is a nondialysis day since he is currently dialyzing on Monday Wednesday and Friday.   Devin Heck, MD [facility] [place] Office: [phone] Pager: [phone]

## 2019-08-31 ENCOUNTER — Other Ambulatory Visit: Payer: Self-pay

## 2019-08-31 ENCOUNTER — Other Ambulatory Visit: Payer: Self-pay | Admitting: *Deleted

## 2019-08-31 ENCOUNTER — Encounter (HOSPITAL_COMMUNITY): Payer: Self-pay | Admitting: *Deleted

## 2019-08-31 NOTE — Progress Notes (Signed)
Anesthesia Chart Review:  Case: [id number] Date/Time: 09/01/19 5631   Procedure: ARTERIOVENOUS (AV) FISTULA CREATION (Right )   Anesthesia type: MAC   Pre-op diagnosis: end stage renal disease   Location: [facility] OR ROOM 10 / [place] [facility]   Surgeon: Marty Heck, MD      DISCUSSION: Patient is a 63 year old male scheduled for the above procedure. Surgery cancelled on 08/25/19 due BP 200/115-130 consistently. Reportedly labetalol now added to antihypertensive regimen.  History includes never smoker, HTN, anemia, ESRD.    He is a same day work-up, so labs and anesthesia team evaluation on the day of surgery.    VS: Ht _0  (1.753 m)   Wt 77.1 kg   BMI 25.10 kg/m   PROVIDERS: Venia Carbon, MD - Seen by Charlynn Grimes, MD and Thompson Grayer, MD in 08/2018 for elevated troponin (in setting of AKI, metabolic acidosis, uncontrolled HTN) felt likely due to demand ischemia and ACS. Not felt to be a candidate for ischemic work-up at that time.  LABS: For day of surgery.   IMAGES: 1V CXR 04/14/19: IMPRESSION: Persistent but improving bilateral patchy opacities, edema versus multifocal pneumonia. Suspect small pleural effusions.   EKG: 04/12/19.  Sinus rhythm Probable left atrial enlargement Inferior infarct, acute (RCA) Probable RV involvement, suggest recording right precordial leads Baseline wander in lead(s) II III aVF --Poor tracing due to baseline wanderer. Defer to anesthesiologist if desire to repeat day of surgery.   CV: Echo 04/13/19: IMPRESSIONS  1. The left ventricle has normal systolic function, with an ejection fraction of 55-60%. The cavity size was normal. There is mildly increased left ventricular wall thickness. Left ventricular diastolic Doppler parameters are consistent with  pseudonormalization. Elevated mean left atrial pressure.  2. The right ventricle has normal systolic function. The cavity was normal. There is no increase in right ventricular wall  thickness.  3. Left atrial size was mildly dilated.  4. Moderate pleural effusion in the left lateral region.  5. No evidence of mitral valve stenosis.  6. The aortic valve is tricuspid. Mild thickening of the aortic valve.  7. The aortic root is normal in size and structure.  8. Mildly dilated pulmonary artery.   Past Medical History:  Diagnosis Date  . Allergy   . Anemia   . BPH (benign prostatic hypertrophy)   . Hypertension   . Renal insufficiency   . Zoster 2007   facial, hospital 2007    Past Surgical History:  Procedure Laterality Date  . DIALYSIS/PERMA CATHETER INSERTION N/A 04/15/2019   Procedure: DIALYSIS/PERMA CATHETER INSERTION;  Surgeon: Katha Cabal, MD;  Location: [place] [facility];  Service: Cardiovascular;  Laterality: N/A;    MEDICATIONS: No current facility-administered medications for this encounter.    Marland Kitchen acetaminophen (TYLENOL) 500 MG tablet  . amLODipine (NORVASC) 10 MG tablet  . AURYXIA 1 GM 210 MG(Fe) tablet  . B Complex-C-Folic Acid (RENA-VITE RX) 1 MG TABS  . cetirizine (ZYRTEC) 10 MG tablet  . fluticasone (FLONASE) 50 MCG/ACT nasal spray  . labetalol (NORMODYNE) 100 MG tablet  . losartan (COZAAR) 100 MG tablet  . Multiple Vitamin (MULTIVITAMIN WITH MINERALS) TABS tablet  . sodium bicarbonate 650 MG tablet     Myra Gianotti, PA-C Surgical Short Stay/Anesthesiology [facility] Phone [phone] [facility] Phone [phone] 08/31/2019 5:02 PM

## 2019-08-31 NOTE — Progress Notes (Signed)
Patient denies shortness of breath, fever, cough and chest pain.  Patient's wife Devin Harmon needs to be in [place] on DOS due to patient's slurred speech, hard to understand.   PCP - Dr Viviana Simpler Cardiologist - Denies  Chest x-ray - 04/14/19, 1 view EKG - 04/12/19 Stress Test -  ECHO - 04/13/19 Cardiac Cath - Denies  Anesthesia review: Ebony Hail, [place]  STOP now taking any Aspirin (unless otherwise instructed by your surgeon), Aleve, Naproxen, Ibuprofen, Motrin, Advil, Goody's, BC's, all herbal medications, fish oil, and all vitamins.   Coronavirus Screening Have you or your wife experienced the following symptoms:  Cough yes/no: No Fever (>100.8F)  yes/no: No Runny nose yes/no: No Sore throat yes/no: No Difficulty breathing/shortness of breath  yes/no: No  Have you or your wife traveled in the last 14 days and where? yes/no: No   Wife Devin Harmon verbalized understanding of instructions that were given to them via phone.

## 2019-08-31 NOTE — Anesthesia Preprocedure Evaluation (Addendum)
Anesthesia Evaluation  Patient identified by MRN, date of birth, ID band Patient awake    Reviewed: Allergy & Precautions, NPO status , Patient's Chart, lab work & pertinent test results, reviewed documented beta blocker date and time   Airway Mallampati: II  TM Distance: >3 FB Neck ROM: Full    Dental  (+) Dental Advisory Given, Missing   Pulmonary neg pulmonary ROS,    Pulmonary exam normal breath sounds clear to auscultation       Cardiovascular hypertension, Pt. on medications and Pt. on home beta blockers Normal cardiovascular exam Rhythm:Regular Rate:Normal  Echo 04/13/19: IMPRESSIONS 1. The left ventricle has normal systolic function, with an ejection fraction of 55-60%. The cavity size was normal. There is mildly increased left ventricular wall thickness. Left ventricular diastolic Doppler parameters are consistent with  pseudonormalization. Elevated mean left atrial pressure. 2. The right ventricle has normal systolic function. The cavity was normal. There is no increase in right ventricular wall thickness. 3. Left atrial size was mildly dilated. 4. Moderate pleural effusion in the left lateral region. 5. No evidence of mitral valve stenosis. 6. The aortic valve is tricuspid. Mild thickening of the aortic valve. 7. The aortic root is normal in size and structure. 8. Mildly dilated pulmonary artery.   Neuro/Psych negative neurological ROS  negative psych ROS   GI/Hepatic negative GI ROS, Neg liver ROS,   Endo/Other  negative endocrine ROS  Renal/GU ESRF and DialysisRenal diseaseK+ 4.2     Musculoskeletal negative musculoskeletal ROS (+)   Abdominal   Peds  Hematology  (+) Blood dyscrasia, anemia ,   Anesthesia Other Findings Day of surgery medications reviewed with the patient.  Reproductive/Obstetrics                           Anesthesia Physical Anesthesia Plan  ASA:  III  Anesthesia Plan: MAC   Post-op Pain Management:    Induction: Intravenous  PONV Risk Score and Plan: 1 and Ondansetron and Propofol infusion  Airway Management Planned: Natural Airway and Nasal Cannula  Additional Equipment:   Intra-op Plan:   Post-operative Plan:   Informed Consent: I have reviewed the patients History and Physical, chart, labs and discussed the procedure including the risks, benefits and alternatives for the proposed anesthesia with the patient or authorized representative who has indicated his/her understanding and acceptance.     Dental advisory given  Plan Discussed with: CRNA  Anesthesia Plan Comments: (PAT note written 08/31/2019 by Myra Gianotti, PA-C. SAME DAY WORK-UP   )      Anesthesia Quick Evaluation

## 2019-09-01 ENCOUNTER — Ambulatory Visit (HOSPITAL_COMMUNITY): Payer: 59 | Admitting: Anesthesiology

## 2019-09-01 ENCOUNTER — Ambulatory Visit (HOSPITAL_COMMUNITY)
Admission: RE | Admit: 2019-09-01 | Discharge: 2019-09-01 | Disposition: A | Discharge status: Home / Self Care | Payer: 59 | Attending: Vascular Surgery | Admitting: Vascular Surgery

## 2019-09-01 ENCOUNTER — Other Ambulatory Visit: Payer: Self-pay

## 2019-09-01 ENCOUNTER — Encounter (HOSPITAL_COMMUNITY): Payer: Self-pay

## 2019-09-01 ENCOUNTER — Encounter (HOSPITAL_COMMUNITY)
Admission: RE | Disposition: A | Discharge status: Home / Self Care | Payer: Self-pay | Source: Home / Self Care | Attending: Vascular Surgery

## 2019-09-01 DIAGNOSIS — Z8249 Family history of ischemic heart disease and other diseases of the circulatory system: Secondary | ICD-10-CM | POA: Insufficient documentation

## 2019-09-01 DIAGNOSIS — Z79899 Other long term (current) drug therapy: Secondary | ICD-10-CM | POA: Diagnosis not present

## 2019-09-01 DIAGNOSIS — Z20828 Contact with and (suspected) exposure to other viral communicable diseases: Secondary | ICD-10-CM | POA: Insufficient documentation

## 2019-09-01 DIAGNOSIS — Z885 Allergy status to narcotic agent status: Secondary | ICD-10-CM | POA: Diagnosis not present

## 2019-09-01 DIAGNOSIS — Z992 Dependence on renal dialysis: Secondary | ICD-10-CM | POA: Insufficient documentation

## 2019-09-01 DIAGNOSIS — N186 End stage renal disease: Secondary | ICD-10-CM | POA: Insufficient documentation

## 2019-09-01 DIAGNOSIS — N185 Chronic kidney disease, stage 5: Secondary | ICD-10-CM

## 2019-09-01 DIAGNOSIS — Z882 Allergy status to sulfonamides status: Secondary | ICD-10-CM | POA: Diagnosis not present

## 2019-09-01 DIAGNOSIS — I120 Hypertensive chronic kidney disease with stage 5 chronic kidney disease or end stage renal disease: Secondary | ICD-10-CM | POA: Insufficient documentation

## 2019-09-01 HISTORY — PX: AV FISTULA PLACEMENT: SHX1204

## 2019-09-01 LAB — POCT I-STAT, CHEM 8
BUN: 31 mg/dL — ABNORMAL HIGH (ref 8–23)
Calcium, Ion: 1.1 mmol/L — ABNORMAL LOW (ref 1.15–1.40)
Chloride: 104 mmol/L (ref 98–111)
Creatinine, Ser: 7.3 mg/dL — ABNORMAL HIGH (ref 0.61–1.24)
Glucose, Bld: 91 mg/dL (ref 70–99)
HCT: 37 % — ABNORMAL LOW (ref 39.0–52.0)
Hemoglobin: 12.6 g/dL — ABNORMAL LOW (ref 13.0–17.0)
Potassium: 4.2 mmol/L (ref 3.5–5.1)
Sodium: 139 mmol/L (ref 135–145)
TCO2: 23 mmol/L (ref 22–32)

## 2019-09-01 LAB — SARS CORONAVIRUS 2 BY RT PCR (HOSPITAL ORDER, PERFORMED IN ~~LOC~~ HOSPITAL LAB): SARS Coronavirus 2: NEGATIVE

## 2019-09-01 SURGERY — ARTERIOVENOUS (AV) FISTULA CREATION
Anesthesia: General | Site: Arm Upper | Laterality: Right

## 2019-09-01 MED ORDER — ACETAMINOPHEN 500 MG PO TABS
1000.0000 mg | ORAL_TABLET | Freq: Once | ORAL | Status: AC
  Administered 2019-09-01: 08:00:00 1000 mg via ORAL
  Filled 2019-09-01: qty 2

## 2019-09-01 MED ORDER — LIDOCAINE HCL (CARDIAC) PF 100 MG/5ML IV SOSY
PREFILLED_SYRINGE | INTRAVENOUS | Status: DC | PRN
  Administered 2019-09-01: 40 mg via INTRAVENOUS

## 2019-09-01 MED ORDER — PROPOFOL 1000 MG/100ML IV EMUL
INTRAVENOUS | Status: AC
  Filled 2019-09-01: qty 100

## 2019-09-01 MED ORDER — LIDOCAINE 2% (20 MG/ML) 5 ML SYRINGE
INTRAMUSCULAR | Status: AC
  Filled 2019-09-01: qty 5

## 2019-09-01 MED ORDER — MIDAZOLAM HCL 2 MG/2ML IJ SOLN
INTRAMUSCULAR | Status: AC
  Filled 2019-09-01: qty 2

## 2019-09-01 MED ORDER — CEFAZOLIN SODIUM-DEXTROSE 2-4 GM/100ML-% IV SOLN
2.0000 g | INTRAVENOUS | Status: DC
  Filled 2019-09-01: qty 100

## 2019-09-01 MED ORDER — EPHEDRINE 5 MG/ML INJ
INTRAVENOUS | Status: AC
  Filled 2019-09-01: qty 10

## 2019-09-01 MED ORDER — FENTANYL CITRATE (PF) 100 MCG/2ML IJ SOLN: 25.0000 ug | INTRAMUSCULAR | Status: DC | PRN

## 2019-09-01 MED ORDER — ONDANSETRON HCL 4 MG/2ML IJ SOLN: 4.0000 mg | Freq: Once | INTRAMUSCULAR | Status: DC | PRN

## 2019-09-01 MED ORDER — FENTANYL CITRATE (PF) 250 MCG/5ML IJ SOLN
INTRAMUSCULAR | Status: DC | PRN
  Administered 2019-09-01: 25 ug via INTRAVENOUS
  Administered 2019-09-01: 50 ug via INTRAVENOUS

## 2019-09-01 MED ORDER — HEPARIN SODIUM (PORCINE) 1000 UNIT/ML IJ SOLN
INTRAMUSCULAR | Status: DC | PRN
  Administered 2019-09-01: 3000 [IU] via INTRAVENOUS

## 2019-09-01 MED ORDER — 0.9 % SODIUM CHLORIDE (POUR BTL) OPTIME
TOPICAL | Status: DC | PRN
  Administered 2019-09-01: 10:00:00 1000 mL

## 2019-09-01 MED ORDER — EPHEDRINE SULFATE-NACL 50-0.9 MG/10ML-% IV SOSY
PREFILLED_SYRINGE | INTRAVENOUS | Status: DC | PRN
  Administered 2019-09-01: 5 mg via INTRAVENOUS

## 2019-09-01 MED ORDER — PAPAVERINE HCL 30 MG/ML IJ SOLN
INTRAMUSCULAR | Status: AC
  Filled 2019-09-01: qty 2

## 2019-09-01 MED ORDER — LIDOCAINE HCL (PF) 1 % IJ SOLN
INTRAMUSCULAR | Status: AC
  Filled 2019-09-01: qty 30

## 2019-09-01 MED ORDER — SODIUM CHLORIDE 0.9 % IV SOLN
INTRAVENOUS | Status: AC
  Filled 2019-09-01: qty 1.2

## 2019-09-01 MED ORDER — TRAMADOL HCL 50 MG PO TABS: 50.0000 mg | ORAL_TABLET | Freq: Four times a day (QID) | ORAL | 0 refills | Status: DC | PRN

## 2019-09-01 MED ORDER — PROPOFOL 10 MG/ML IV BOLUS
INTRAVENOUS | Status: DC | PRN
  Administered 2019-09-01: 70 mg via INTRAVENOUS

## 2019-09-01 MED ORDER — PROPOFOL 10 MG/ML IV BOLUS
INTRAVENOUS | Status: AC
  Filled 2019-09-01: qty 20

## 2019-09-01 MED ORDER — ONDANSETRON HCL 4 MG/2ML IJ SOLN
INTRAMUSCULAR | Status: DC | PRN
  Administered 2019-09-01: 4 mg via INTRAVENOUS

## 2019-09-01 MED ORDER — PROPOFOL 500 MG/50ML IV EMUL
INTRAVENOUS | Status: DC | PRN
  Administered 2019-09-01: 100 ug/kg/min via INTRAVENOUS

## 2019-09-01 MED ORDER — LIDOCAINE HCL 1 % IJ SOLN
INTRAMUSCULAR | Status: DC | PRN
  Administered 2019-09-01: 5 mL

## 2019-09-01 MED ORDER — SODIUM CHLORIDE 0.9 % IV SOLN
INTRAVENOUS | Status: DC | PRN
  Administered 2019-09-01: 10:00:00 500 mL

## 2019-09-01 MED ORDER — FENTANYL CITRATE (PF) 250 MCG/5ML IJ SOLN
INTRAMUSCULAR | Status: AC
  Filled 2019-09-01: qty 5

## 2019-09-01 MED ORDER — SODIUM CHLORIDE 0.9 % IV SOLN
INTRAVENOUS | Status: DC
  Administered 2019-09-01: 08:00:00 via INTRAVENOUS

## 2019-09-01 SURGICAL SUPPLY — 32 items
ARMBAND PINK RESTRICT EXTREMIT (MISCELLANEOUS) ×6 IMPLANT
CANISTER SUCT 3000ML PPV (MISCELLANEOUS) ×3 IMPLANT
CLIP VESOCCLUDE MED 6/CT (CLIP) ×3 IMPLANT
CLIP VESOCCLUDE SM WIDE 6/CT (CLIP) ×3 IMPLANT
COVER PROBE W GEL 5X96 (DRAPES) ×3 IMPLANT
COVER WAND RF STERILE (DRAPES) ×3 IMPLANT
DECANTER SPIKE VIAL GLASS SM (MISCELLANEOUS) ×3 IMPLANT
DERMABOND ADVANCED (GAUZE/BANDAGES/DRESSINGS) ×2
DERMABOND ADVANCED .7 DNX12 (GAUZE/BANDAGES/DRESSINGS) ×1 IMPLANT
ELECT REM PT RETURN 9FT ADLT (ELECTROSURGICAL) ×3
ELECTRODE REM PT RTRN 9FT ADLT (ELECTROSURGICAL) ×1 IMPLANT
GLOVE BIO SURGEON STRL SZ7.5 (GLOVE) ×3 IMPLANT
GLOVE BIOGEL PI IND STRL 8 (GLOVE) ×1 IMPLANT
GLOVE BIOGEL PI INDICATOR 8 (GLOVE) ×2
GOWN STRL REUS W/ TWL LRG LVL3 (GOWN DISPOSABLE) ×2 IMPLANT
GOWN STRL REUS W/ TWL XL LVL3 (GOWN DISPOSABLE) ×2 IMPLANT
GOWN STRL REUS W/TWL LRG LVL3 (GOWN DISPOSABLE) ×4
GOWN STRL REUS W/TWL XL LVL3 (GOWN DISPOSABLE) ×4
HEMOSTAT SPONGE AVITENE ULTRA (HEMOSTASIS) IMPLANT
KIT BASIN OR (CUSTOM PROCEDURE TRAY) ×3 IMPLANT
KIT TURNOVER KIT B (KITS) ×3 IMPLANT
NS IRRIG 1000ML POUR BTL (IV SOLUTION) ×3 IMPLANT
PACK CV ACCESS (CUSTOM PROCEDURE TRAY) ×3 IMPLANT
PAD ARMBOARD 7.5X6 YLW CONV (MISCELLANEOUS) ×6 IMPLANT
SUT MNCRL AB 4-0 PS2 18 (SUTURE) ×3 IMPLANT
SUT PROLENE 6 0 BV (SUTURE) ×6 IMPLANT
SUT PROLENE 7 0 BV 1 (SUTURE) IMPLANT
SUT VIC AB 3-0 SH 27 (SUTURE) ×4
SUT VIC AB 3-0 SH 27X BRD (SUTURE) ×2 IMPLANT
TOWEL GREEN STERILE (TOWEL DISPOSABLE) ×3 IMPLANT
UNDERPAD 30X30 (UNDERPADS AND DIAPERS) ×3 IMPLANT
WATER STERILE IRR 1000ML POUR (IV SOLUTION) ×3 IMPLANT

## 2019-09-01 NOTE — Anesthesia Procedure Notes (Addendum)
Procedure Name: LMA Insertion Date/Time: 09/01/2019 9:58 AM Performed by: Kyung Rudd, CRNA Pre-anesthesia Checklist: Patient identified, Emergency Drugs available, Suction available and Patient being monitored Patient Re-evaluated:Patient Re-evaluated prior to induction Oxygen Delivery Method: Circle System Utilized Preoxygenation: Pre-oxygenation with 100% oxygen Induction Type: IV induction Ventilation: Mask ventilation without difficulty LMA: LMA inserted LMA Size: 4.0 Number of attempts: 1 Airway Equipment and Method: Bite block Placement Confirmation: positive ETCO2 Tube secured with: Tape Dental Injury: Teeth and Oropharynx as per pre-operative assessment

## 2019-09-01 NOTE — Anesthesia Procedure Notes (Deleted)
Performed by: Kyung Rudd, CRNA

## 2019-09-01 NOTE — H&P (Signed)
History and Physical Interval Note:  09/01/2019 8:52 AM  Devin Harmon  has presented today for surgery, with the diagnosis of end stage renal disease.  The various methods of treatment have been discussed with the patient and family. After consideration of risks, benefits and other options for treatment, the patient has consented to  Procedure(s): ARTERIOVENOUS (AV) FISTULA CREATION (Right) as a surgical intervention.  The patient's history has been reviewed, patient examined, no change in status, stable for surgery.  I have reviewed the patient's chart and labs.  Questions were answered to the patient's satisfaction.    Plan for right arm AVF  Devin Harmon  Patient name:Devin L ChavisMRN: [id number] DOB: 1957/11/12Sex: male  REASON FOR CONSULT:NewAVFaccess evaluation  HPI: Devin Harmon a 63 y.o.male,with history of renal failure due to hypertension who presents for new dialysis access evaluation. Patient had a tunneled dialysis catheter placed at [facility] on 04/15/2019 by Dr. Delana Meyer. He has been undergoing dialysis Monday Wednesday Friday. Patient is here with his wife today and is unclear about any chance of recovery of his renal function. Wants to be evaluated for transplant.He is left-handed. He has never had any access placed before. He does deer hunt and runs dogs andhis left hand is important. His catheter has been working fine without any issues. He does not take any blood thinners. No other chest wall implants or ICDs.      Past Medical History:  Diagnosis Date  . Allergy   . BPH (benign prostatic hypertrophy)   . Hypertension   . Renal insufficiency   . Z[place] 2007   facial, hospital 2007         Past Surgical History:  Procedure Laterality Date  . DIALYSIS/PERMA CATHETER INSERTION N/A 04/15/2019   Procedure: DIALYSIS/PERMA CATHETER INSERTION; Surgeon: Katha Cabal, MD; Location:  [facility]; Service: Cardiovascular; Laterality: N/A;         Family History  Problem Relation Age of Onset  . Hypertension Mother   . Cancer Mother    lung cancer  . Hypertension Brother   . Heart disease Father   . Coronary artery disease Neg Hx   . Diabetes Neg Hx     SOCIAL HISTORY: Social History        Socioeconomic History  . Marital status: Married    Spouse name: Not on file  . Number of children: 2  . Years of education: Not on file  . Highest education level: Not on file  Occupational History  . Occupation: Museum/gallery curator, building new homes  Social Needs  . Financial resource strain: Not on file  . Food insecurity    Worry: Not on file    Inability: Not on file  . Transportation needs    Medical: Not on file    Non-medical: Not on file  Tobacco Use  . Smoking status: Never Smoker  . Smokeless tobacco: Never Used  Substance and Sexual Activity  . Alcohol use: Yes    Comment: occasional  . Drug use: Not Currently  . Sexual activity: Not on file  Lifestyle  . Physical activity    Days per week: Not on file    Minutes per session: Not on file  . Stress: Not on file  Relationships  . Social Herbalist on phone: Not on file    Gets together: Not on file    Attends religious service: Not on file    Active member of club or organization:  Not on file    Attends meetings of clubs or organizations: Not on file    Relationship status: Not on file  . Intimate partner violence    Fear of current or ex partner: Not on file    Emotionally abused: Not on file    Physically abused: Not on file    Forced sexual activity: Not on file  Other Topics Concern  . Not on file  Social History Narrative  . Not on file         Allergies  Allergen Reactions  . Chlorthalidone Other (See Comments)    REACTION: felt "bad" and ED REACTION: felt "bad" and ED  . Hydrocodone Other (See  Comments)    Nausea and dizzy  . Sulfa Antibiotics Hives    REACTION: unspecified  . Sulfonamide Derivatives Hives    REACTION: unspecified          Current Outpatient Medications  Medication Sig Dispense Refill  . acetaminophen (TYLENOL) 500 MG tablet Take 500-1,000 mg by mouth every 6 (six) hours as needed for mild pain.    Marland Kitchen amLODipine (NORVASC) 10 MG tablet Take 10 mg by mouth daily.    . B Complex-C-Folic Acid (RENA-VITE RX) 1 MG TABS Take 1 tablet by mouth daily.    . cetirizine (ZYRTEC) 10 MG tablet Take 10 mg by mouth every evening.     . fluticasone (FLONASE) 50 MCG/ACT nasal spray Place 1-2 sprays into both nostrils daily.    Marland Kitchen labetalol (NORMODYNE) 100 MG tablet Take 1 tablet (100 mg total) by mouth 2 (two) times daily. 60 tablet 11  . Multiple Vitamin (MULTIVITAMIN WITH MINERALS) TABS tablet Take 1 tablet by mouth daily.    . sodium bicarbonate 650 MG tablet Take 1 tablet (650 mg total) by mouth 3 (three) times daily. 270 tablet 3  . AURYXIA 1 GM 210 MG(Fe) tablet TAKE 2 TABLETS BY MOUTH THREE TIMES DAILY WITH MEALS.    Marland Kitchen losartan (COZAAR) 100 MG tablet Take 1 tablet (100 mg total) by mouth at bedtime. 30 tablet 1   No current facility-administered medications for this visit.    REVIEW OF SYSTEMS: [X]  denotes positive finding, [ ]  denotes negative finding Cardiac  Comments:  Chest pain or chest pressure:    Shortness of breath upon exertion:    Short of breath when lying flat:    Irregular heart rhythm:        Vascular    Pain in calf, thigh, or hip brought on by ambulation:    Pain in feet at night that wakes you up from your sleep:     Blood clot in your veins:    Leg swelling:         Pulmonary    Oxygen at home:    Productive cough:     Wheezing:         Neurologic    Sudden weakness in arms or legs:     Sudden numbness in arms or legs:     Sudden onset of difficulty speaking or  slurred speech:    Temporary loss of vision in one eye:     Problems with dizziness:         Gastrointestinal    Blood in stool:     Vomited blood:         Genitourinary    Burning when urinating:     Blood in urine:        Psychiatric    Major depression:  Hematologic    Bleeding problems:    Problems with blood clotting too easily:        Skin    Rashes or ulcers:        Constitutional    Fever or chills:      PHYSICAL EXAM:    Vitals:   08/02/19 1024  BP: (!) 188/106  Pulse: 71  Resp: 18  Temp: 97.8 F (36.6 C)  TempSrc: Temporal  SpO2: 98%  Weight: 170 lb (77.1 kg)  Height: 5\' 9"  (1.753 m)    GENERAL: The patient is a well-nourishedmale, in no acute distress. The vital signs are documented above. CARDIAC:There is a regular rate and rhythm.  VASCULAR: 2+ palpable radial and brachial pulses bilateral upper extremities PULMONARY: There is good air exchange bilaterally without wheezing or rales. ABDOMEN:Soft and non-tender with normal pitched bowel sounds.  MUSCULOSKELETAL:There are no major deformities or cyanosis. NEUROLOGIC:No focal weakness or paresthesias are detected. SKIN:There are no ulcers or rashes noted. PSYCHIATRIC:The patient has a normal affect.  DATA:  Upper extremity arterialduplex shows triphasic waveforms in both upper extremities  Upper extremity vein mapping shows nice basilic and cephalic vein in the right upper extremity  Assessment/Plan:  63 year old male now with end-stage renal failure secondary to hypertension thatpresents for evaluation of new access with plans for AV fistula. He currently has a right IJ tunneled dialysis catheter placed at [facility] that [place] [facility]. Given that he is left-handed discussed plan for right arm AV fistula. He has a very nice cephalic vein at theantecubital region and Ithink hewill bea good candidate for  brachiocephalic AV fistula. I discussed risk and benefits of fistula placement in detail with family including risk of infection, bleeding, steal, failure to mature, etc. We will plan to schedule on a Thursday which is a nondialysis day since he is currently dialyzing on Monday Wednesday and Friday.   Devin Heck, MD [facility] Office: [phone] Pager: [phone]

## 2019-09-01 NOTE — Anesthesia Postprocedure Evaluation (Signed)
Anesthesia Post Note  Patient: Devin Harmon  Procedure(s) Performed: ARTERIOVENOUS (AV) FISTULA CREATION RIGHT ARM (Right Arm Upper)     Patient location during evaluation: PACU Anesthesia Type: General Level of consciousness: awake and alert Pain management: pain level controlled Vital Signs Assessment: post-procedure vital signs reviewed and stable Respiratory status: spontaneous breathing, nonlabored ventilation, respiratory function stable and patient connected to nasal cannula oxygen Cardiovascular status: blood pressure returned to baseline and stable Postop Assessment: no apparent nausea or vomiting Anesthetic complications: no    Last Vitals:  Vitals:   09/01/19 1149 09/01/19 1205  BP:    Pulse: (!) 52   Resp: (!) 23 20  Temp:    SpO2:  99%    Last Pain:  Vitals:   09/01/19 1205  TempSrc:   PainSc: 0-No pain                 Catalina Gravel

## 2019-09-01 NOTE — Transfer of Care (Signed)
Immediate Anesthesia Transfer of Care Note  Patient: Devin Harmon  Procedure(s) Performed: ARTERIOVENOUS (AV) FISTULA CREATION RIGHT ARM (Right Arm Upper)  Patient Location: PACU  Anesthesia Type:General  Level of Consciousness: drowsy  Airway & Oxygen Therapy: Patient Spontanous Breathing and Patient connected to face mask oxygen  Post-op Assessment: Report given to RN and Post -op Vital signs reviewed and stable  Post vital signs: Reviewed and stable  Last Vitals:  Vitals Value Taken Time  BP 132/74 09/01/19 1115  Temp    Pulse 51 09/01/19 1119  Resp 28 09/01/19 1119  SpO2 89 % 09/01/19 1119  Vitals shown include unvalidated device data.  Last Pain:  Vitals:   09/01/19 0740  TempSrc:   PainSc: 0-No pain         Complications: No apparent anesthesia complications

## 2019-09-01 NOTE — Discharge Instructions (Signed)
° °  [facility] ° °Discharge Instructions ° °AV Fistula or Graft Surgery for Dialysis Access ° °Please refer to the following instructions for your post-procedure care. Your surgeon or physician assistant will discuss any changes with you. ° °Activity ° °You may drive the day following your surgery, if you are comfortable and no longer taking prescription pain medication. Resume full activity as the soreness in your incision resolves. ° °Bathing/Showering ° °You may shower after you go home. Keep your incision dry for 48 hours. Do not soak in a bathtub, hot tub, or swim until the incision heals completely. You may not shower if you have a hemodialysis catheter. ° °Incision Care ° °Clean your incision with mild soap and water after 48 hours. Pat the area dry with a clean towel. You do not need a bandage unless otherwise instructed. Do not apply any ointments or creams to your incision. You may have skin glue on your incision. Do not peel it off. It will come off on its own in about one week. Your arm may swell a bit after surgery. To reduce swelling use pillows to elevate your arm so it is above your heart. Your doctor will tell you if you need to lightly wrap your arm with an ACE bandage. ° °Diet ° °Resume your normal diet. There are not special food restrictions following this procedure. In order to heal from your surgery, it is CRITICAL to get adequate nutrition. Your body requires vitamins, minerals, and protein. Vegetables are the best source of vitamins and minerals. Vegetables also provide the perfect balance of protein. Processed food has little nutritional value, so try to avoid this. ° °Medications ° °Resume taking all of your medications. If your incision is causing pain, you may take over-the counter pain relievers such as acetaminophen (Tylenol). If you were prescribed a stronger pain medication, please be aware these medications can cause nausea and constipation. Prevent  nausea by taking the medication with a snack or meal. Avoid constipation by drinking plenty of fluids and eating foods with high amount of fiber, such as fruits, vegetables, and grains. Do not take Tylenol if you are taking prescription pain medications. ° ° ° ° °Follow up °Your surgeon may want to see you in the office following your access surgery. If so, this will be arranged at the time of your surgery. ° °Please call us immediately for any of the following conditions: ° °Increased pain, redness, drainage (pus) from your incision site °Fever of 101 degrees or higher °Severe or worsening pain at your incision site °Hand pain or numbness. ° °Reduce your risk of vascular disease: ° °Stop smoking. If you would like help, call [facility] at 1-800-QUIT-NOW (1-800-784-8669) or [place] at 336-586-4000 ° °Manage your cholesterol °Maintain a desired weight °Control your diabetes °Keep your blood pressure down ° °Dialysis ° °It will take several weeks to several months for your new dialysis access to be ready for use. Your surgeon will determine when it is OK to use it. Your nephrologist will continue to direct your dialysis. You can continue to use your Permcath until your new access is ready for use. ° °If you have any questions, please call the office at [phone]. ° °

## 2019-09-01 NOTE — Op Note (Signed)
OPERATIVE NOTE   PROCEDURE: Right brachiocephalic arteriovenous fistula placement  PRE-OPERATIVE DIAGNOSIS: ESRD  POST-OPERATIVE DIAGNOSIS: same as above   SURGEON: Marty Heck, MD  ASSISTANT(S): Arlee Muslim, PA  ANESTHESIA: LMA  ESTIMATED BLOOD LOSS: Minimal  FINDING(S): 1.  Cephalic vein: 4 mm, acceptable 2.  Brachial artery: 4.5 mm, minimal disease 3.  Venous outflow: palpable thrill  4.  Radial flow: palpable radial pulse  SPECIMEN(S):  none  INDICATIONS:   Devin Harmon is a 63 y.o. male who presents with ESRD and need for permanent hemodialysis access.  The patient is scheduled for right brachiocephalic arteriovenous fistula placement.  The patient is aware the risks include but are not limited to: bleeding, infection, steal syndrome, nerve damage, ischemic monomelic neuropathy, failure to mature, and need for additional procedures.  The patient is aware of the risks of the procedure and elects to proceed forward.   DESCRIPTION: After full informed written consent was obtained from the patient, the patient was brought back to the operating room and placed supine upon the operating table.  Prior to induction, the patient received IV antibiotics.   After obtaining adequate anesthesia, the patient was then prepped and draped in the standard fashion for a right arm access procedure.  I turned my attention first to identifying the patient's cephalic vein and brachial artery.  Using SonoSite guidance, the location of these vessels were marked out on the skin.     At this point, I injected local anesthetic to obtain a field block of the antecubitum.  In total, I injected about 10 mL of 1% lidocaine without epinephrine.  I made a transverse incision below the level of the antecubitum and dissected through the subcutaneous tissue and fascia to gain exposure of the brachial artery.  This was noted to be 4.5 mm in diameter externally.  This was dissected out proximally  and distally and controlled with vessel loops .  I then dissected out the cephalic vein.  This was noted to be 4 mm in diameter externally.  The distal segment of the vein was ligated with a  2-0 silk, and the vein was transected.  The proximal segment was interrogated with serial dilators.  The vein accepted up to a 5 mm dilator without any difficulty.  I then instilled the heparinized saline into the vein and clamped it.  The patient was given 3000 units IV heparin.  At this point, I reset my exposure of the brachial artery and placed the artery under tension proximally and distally.  I made an arteriotomy with a #11 blade, and then I extended the arteriotomy with a Potts scissor.  I injected heparinized saline proximal and distal to this arteriotomy.  The vein was then sewn to the artery in an end-to-side configuration with a running stitch of 6-0 Prolene.  Prior to completing this anastomosis, I allowed the vein and artery to backbleed.  There was no evidence of clot from any vessels.  I completed the anastomosis in the usual fashion and then released all vessel loops and clamps.    There was a palpable thrill in the venous outflow, and there was a palpable radial pulse.  At this point, I irrigated out the surgical wound.  There was no further active bleeding.  The subcutaneous tissue was reapproximated with a running stitch of 3-0 Vicryl.  The skin was then reapproximated with a running subcuticular stitch of 4-0 Monocryl.  The skin was then cleaned, dried, and reinforced with Dermabond.  The patient tolerated this procedure well.   COMPLICATIONS: None  CONDITION: Stable  Marty Heck, MD [facility] Office: [phone] Pager: [phone]  09/01/2019, 11:04 AM

## 2019-09-02 ENCOUNTER — Encounter (HOSPITAL_COMMUNITY): Payer: Self-pay | Admitting: Vascular Surgery

## 2019-09-15 ENCOUNTER — Telehealth: Payer: Self-pay

## 2019-09-15 NOTE — Telephone Encounter (Signed)
Pt called and said that he is still having some swelling and wanted to know what they could do to help and if this was normal.   Discussed some swelling is normal and told him that he needed to be sure to use elevation - raising his arm up over the level of his heart and squeezing his hands and fingers like he is squeezing a stress ball. Advised to do these things and if no improvement to call and let us know.   [employer]. CMA

## 2019-10-04 ENCOUNTER — Other Ambulatory Visit: Payer: Self-pay

## 2019-10-04 DIAGNOSIS — N186 End stage renal disease: Secondary | ICD-10-CM

## 2019-10-11 ENCOUNTER — Other Ambulatory Visit: Payer: Self-pay

## 2019-10-11 ENCOUNTER — Ambulatory Visit (INDEPENDENT_AMBULATORY_CARE_PROVIDER_SITE_OTHER): Payer: Self-pay | Admitting: Physician Assistant

## 2019-10-11 ENCOUNTER — Ambulatory Visit (HOSPITAL_COMMUNITY)
Admission: RE | Admit: 2019-10-11 | Discharge: 2019-10-11 | Disposition: A | Discharge status: Home / Self Care | Payer: 59 | Source: Ambulatory Visit | Attending: Family | Admitting: Family

## 2019-10-11 VITALS — BP 166/95 | HR 68 | Temp 97.7°F | Resp 12 | Ht 68.0 in | Wt 195.0 lb

## 2019-10-11 DIAGNOSIS — N186 End stage renal disease: Secondary | ICD-10-CM | POA: Insufficient documentation

## 2019-10-11 NOTE — Progress Notes (Signed)
    Postoperative Access Visit   History of Present Illness   Devin Harmon is a 63 y.o. year old male who presents for postoperative follow-up for: right brachiocephalic arteriovenous fistula by Dr. Carlis Abbott (Date: 09/01/19).  The patient's wounds are healed.  The patient denies steal symptoms.  The patient however still has an impressive amount of edema of RUE.  R IJ TDC is working well on HD.  Patient's wife says that he has not been able to elevate his arm and try to make more of an effort.  He denies any fevers, chills, N/V  Physical Examination   Vitals:   10/11/19 1334  BP: (!) 166/95  Pulse: 68  Resp: 12  Temp: 97.7 F (36.5 C)  TempSrc: Temporal  SpO2: 97%  Weight: 195 lb (88.5 kg)  Height: 5\' 8"  (1.727 m)   Body mass index is 29.65 kg/m.  right arm Incision is healed, hand grip is 5/5, sensation in digits is intact, palpable thrill, bruit can be auscultated; palpable R radial pulse; moderate to severe edema R UE; prominent neck veins     Medical Decision Making   Devin Harmon is a 63 y.o. year old male who presents s/p right brachiocephalic arteriovenous fistula   Patent brachiocephalic fistula without signs or symptoms of steal syndrome  Encouraged patient to elevate R arm and exercise hand; wrapped with ACE from hand to shoulder  Follow up in 4-6 weeks to reassess fistula; fistula has matured to an adequate diameter and I believe once edema has resolved the fistula will be less than 6 mm depth  Consider moving TDC to left side if edema does not improve     Devin Ligas PA-C [facility] Office: [phone]  Clinic MD: Dr. Donnetta Hutching

## 2019-11-10 ENCOUNTER — Ambulatory Visit (INDEPENDENT_AMBULATORY_CARE_PROVIDER_SITE_OTHER): Payer: Self-pay | Admitting: Family

## 2019-11-10 ENCOUNTER — Other Ambulatory Visit: Payer: Self-pay

## 2019-11-10 ENCOUNTER — Encounter: Payer: Self-pay | Admitting: Family

## 2019-11-10 VITALS — BP 184/96 | HR 70 | Temp 97.8°F | Resp 20 | Ht 69.0 in | Wt 209.0 lb

## 2019-11-10 DIAGNOSIS — N186 End stage renal disease: Secondary | ICD-10-CM

## 2019-11-10 DIAGNOSIS — I770 Arteriovenous fistula, acquired: Secondary | ICD-10-CM

## 2019-11-10 DIAGNOSIS — Z992 Dependence on renal dialysis: Secondary | ICD-10-CM

## 2019-11-10 NOTE — Progress Notes (Signed)
CC: Follow up AVF placement, right arm edema not improved   History of Present Illness  Devin Harmon is a 63 y.o. ([birth year]) male who is s/p right brachiocephalic arteriovenous fistula placement on 09-01-19 by Dr. Carlis Abbott.  This is his first permanent HD access. He has had the right [facility] since May 2020, inserted by Dr. Delana Meyer in [place].   He was contemplating PD, changed his mind.   He dialyzes via right IJ TDC on MWF at [facility] in [place].  Pt was last evaluated on 10-11-19 by M. Eveland PA-C. At that time pt had a patent brachiocephalic fistula without signs or symptoms of steal syndrome. Encouraged patient to elevate R arm and exercise hand; wrapped with ACE from hand to shoulder. Pt was to follow up in 4-6 weeks to reassess fistula; fistula had matured to an adequate diameter and I believe once edema has resolved the fistula will be less than 6 mm depth. Consider moving TDC to left side if edema does not improve.  Wife with pt states he is elevating his right arm when he is not outside working. By Monday the swelling in his right arm is quite swollen again.  Wife states both of his legs are also swollen, right more so than left.   He and his wife deny that pt having a history of a stroke, but his speech is mostly unintelligible. His wife is helping me understand what he is saying.   He denies any steal type symptoms in his right hand.    Pt denies dyspnea, denies fever or chills.    Past Medical History:  Diagnosis Date  . Allergy   . Anemia   . BPH (benign prostatic hypertrophy)   . Hypertension   . Renal insufficiency   . Zoster 2007   facial, hospital 2007    Social History Social History   Tobacco Use  . Smoking status: Never Smoker  . Smokeless tobacco: Never Used  Substance Use Topics  . Alcohol use: Not Currently    Comment: occasional but none since 2019  . Drug use: Not Currently    Family History Family History  Problem Relation Age of  Onset  . Hypertension Mother   . Cancer Mother        lung cancer  . Hypertension Brother   . Heart disease Father   . Coronary artery disease Neg Hx   . Diabetes Neg Hx     Surgical History Past Surgical History:  Procedure Laterality Date  . AV FISTULA PLACEMENT Right 09/01/2019   Procedure: ARTERIOVENOUS (AV) FISTULA CREATION RIGHT ARM;  Surgeon: Devin Heck, MD;  Location: [place];  Service: Vascular;  Laterality: Right;  . DIALYSIS/PERMA CATHETER INSERTION N/A 04/15/2019   Procedure: DIALYSIS/PERMA CATHETER INSERTION;  Surgeon: Devin Cabal, MD;  Location: [place] CV [facility];  Service: Cardiovascular;  Laterality: N/A;    Allergies  Allergen Reactions  . Chlorthalidone Other (See Comments)    REACTION: felt "bad" and ED   . Hydrocodone Nausea Only    dizzy  . Sulfa Antibiotics Hives  . Sulfonamide Derivatives Hives    Current Outpatient Medications  Medication Sig Dispense Refill  . acetaminophen (TYLENOL) 500 MG tablet Take 500-1,000 mg by mouth every 6 (six) hours as needed for mild pain.    Marland Kitchen amLODipine (NORVASC) 10 MG tablet Take 10 mg by mouth daily.    Lorin Picket 1 GM 210 MG(Fe) tablet Take 420 mg by mouth 3 (three)  times daily with meals.     . B Complex-C-Folic Acid (RENA-VITE RX) 1 MG TABS Take 1 tablet by mouth daily.    . cetirizine (ZYRTEC) 10 MG tablet Take 10 mg by mouth every evening.     . fluticasone (FLONASE) 50 MCG/ACT nasal spray Place 1-2 sprays into both nostrils daily as needed for allergies.     Marland Kitchen labetalol (NORMODYNE) 100 MG tablet Take 1 tablet (100 mg total) by mouth 2 (two) times daily. 60 tablet 11  . Multiple Vitamin (MULTIVITAMIN WITH MINERALS) TABS tablet Take 1 tablet by mouth daily.    . sodium bicarbonate 650 MG tablet Take 1 tablet (650 mg total) by mouth 3 (three) times daily. 270 tablet 3  . traMADol (ULTRAM) 50 MG tablet Take 1 tablet (50 mg total) by mouth every 6 (six) hours as needed. 15 tablet 0  . losartan (COZAAR)  100 MG tablet Take 1 tablet (100 mg total) by mouth at bedtime. (Patient not taking: Reported on 08/24/2019) 30 tablet 1   No current facility-administered medications for this visit.     REVIEW OF SYSTEMS: see HPI for pertinent positives and negatives    PHYSICAL EXAMINATION:  Vitals:   11/10/19 0943  BP: (!) 184/96  Pulse: 70  Resp: 20  Temp: 97.8 F (36.6 C)  TempSrc: Temporal  SpO2: 98%  Weight: 209 lb (94.8 kg)  Height: 5\' 9"  (1.753 m)   Body mass index is 30.86 kg/m.  General: Obese male in NAD, accompanied by his wife HEENT:  No gross abnormalities Pulmonary: Respirations are non-labored, fair air movement in all fields, no rales, rhonchi, or wheezes Abdomen: Soft and non-tender with. Musculoskeletal: There are no major deformities.   Neurologic: No focal weakness or paresthesias are detected, bilateral hand strength is 5/5. Speech is unintelligible for the most part.  Skin: There are no ulcer or rashes noted. Psychiatric: The patient has normal affect. Cardiovascular: There is a regular rate and rhythm without significant murmur appreciated.   Right upper arm AVF with fairly good bruit, thrill is palpable distally. Right IJ TDC in place. Right radial pulse is 2+ palpable  Entire right arm and hand with 2+ non pitting edema, see photo below   Right upper arm AVF, same side as right IJ [facility]   Non-Invasive Vascular Imaging  Right Arm Access Duplex  (Date: 10-11-19):  Findings: +--------------------+----------+-----------------+--------+ AVF                 PSV (cm/s)Flow Vol (mL/min)Comments +--------------------+----------+-----------------+--------+ Native artery inflow   270          1412                +--------------------+----------+-----------------+--------+ AVF Anastomosis        200                              +--------------------+----------+-----------------+--------+  +------------+----------+-------------+----------+--------------------+ OUTFLOW VEINPSV (cm/s)Diameter (cm)Depth (cm)      Describe       +------------+----------+-------------+----------+--------------------+ Prox UA        458        0.61        1.09   branch 0.27and 0.287 +------------+----------+-------------+----------+--------------------+ Mid UA         461        0.54        0.57     branch 0.214 cm    +------------+----------+-------------+----------+--------------------+ Dist UA  139        0.90        0.77                        +------------+----------+-------------+----------+--------------------+ Summary: Patent right brachiocephalic AVF. Elevated velocities in the mid and proximal outflow vein at the confluence with branches. Significant interstitial fluid.   Medical Decision Making  ISSAIH FLIPPEN is a 63 y.o. male who is s/p right brachiocephalic arteriovenous fistula placement on 09-01-19 by Dr. Carlis Abbott.  This is his first permanent HD access. He has had the right [facility] since May 2020, inserted by Dr. Delana Meyer in [place].   Right arm and hand remain edematous despite efforts to elevate right arm. Right IJ TDC may be an issue that facilitates right upper extremity edema. I spoke with Dr. Scot Dock re this. Will schedule pt for removal of right IJ TDC and insertion of left IJ TDC on a non HD day.  This will hopefully resolve right arm edema; if not, then fistula gram will likely be needed.   Clemon Chambers, RN, MSN, FNP-C [facility] [place] Office: [phone]  11/10/2019, 9:51 AM  Clinic MD: Laqueta Due

## 2019-11-11 ENCOUNTER — Other Ambulatory Visit (HOSPITAL_COMMUNITY)
Admission: RE | Admit: 2019-11-11 | Discharge: 2019-11-11 | Disposition: A | Discharge status: Home / Self Care | Payer: 59 | Source: Ambulatory Visit | Attending: Vascular Surgery | Admitting: Vascular Surgery

## 2019-11-11 DIAGNOSIS — Z20828 Contact with and (suspected) exposure to other viral communicable diseases: Secondary | ICD-10-CM | POA: Diagnosis not present

## 2019-11-11 DIAGNOSIS — Z01812 Encounter for preprocedural laboratory examination: Secondary | ICD-10-CM | POA: Insufficient documentation

## 2019-11-12 ENCOUNTER — Other Ambulatory Visit: Payer: Self-pay | Admitting: Internal Medicine

## 2019-11-12 LAB — NOVEL CORONAVIRUS, NAA (HOSP ORDER, SEND-OUT TO REF LAB; TAT 18-24 HRS): SARS-CoV-2, NAA: NOT DETECTED

## 2019-11-14 ENCOUNTER — Encounter (HOSPITAL_COMMUNITY): Payer: Self-pay | Admitting: Vascular Surgery

## 2019-11-14 ENCOUNTER — Other Ambulatory Visit: Payer: Self-pay

## 2019-11-15 ENCOUNTER — Other Ambulatory Visit: Payer: Self-pay | Admitting: Physician Assistant

## 2019-11-15 ENCOUNTER — Ambulatory Visit (HOSPITAL_COMMUNITY)
Admission: RE | Admit: 2019-11-15 | Discharge: 2019-11-15 | Disposition: A | Discharge status: Home / Self Care | Payer: 59 | Attending: Vascular Surgery | Admitting: Vascular Surgery

## 2019-11-15 ENCOUNTER — Encounter (HOSPITAL_COMMUNITY)
Admission: RE | Disposition: A | Discharge status: Home / Self Care | Payer: Self-pay | Source: Home / Self Care | Attending: Vascular Surgery

## 2019-11-15 ENCOUNTER — Ambulatory Visit (HOSPITAL_COMMUNITY): Payer: 59

## 2019-11-15 ENCOUNTER — Ambulatory Visit (HOSPITAL_COMMUNITY): Payer: 59 | Admitting: Physician Assistant

## 2019-11-15 ENCOUNTER — Encounter (HOSPITAL_COMMUNITY): Payer: Self-pay | Admitting: Vascular Surgery

## 2019-11-15 ENCOUNTER — Other Ambulatory Visit: Payer: Self-pay

## 2019-11-15 DIAGNOSIS — D631 Anemia in chronic kidney disease: Secondary | ICD-10-CM | POA: Diagnosis not present

## 2019-11-15 DIAGNOSIS — Z79899 Other long term (current) drug therapy: Secondary | ICD-10-CM | POA: Diagnosis not present

## 2019-11-15 DIAGNOSIS — Z992 Dependence on renal dialysis: Secondary | ICD-10-CM | POA: Insufficient documentation

## 2019-11-15 DIAGNOSIS — Z95828 Presence of other vascular implants and grafts: Secondary | ICD-10-CM

## 2019-11-15 DIAGNOSIS — R600 Localized edema: Secondary | ICD-10-CM | POA: Diagnosis present

## 2019-11-15 DIAGNOSIS — I120 Hypertensive chronic kidney disease with stage 5 chronic kidney disease or end stage renal disease: Secondary | ICD-10-CM | POA: Insufficient documentation

## 2019-11-15 DIAGNOSIS — N186 End stage renal disease: Secondary | ICD-10-CM

## 2019-11-15 DIAGNOSIS — Z419 Encounter for procedure for purposes other than remedying health state, unspecified: Secondary | ICD-10-CM

## 2019-11-15 DIAGNOSIS — T82898A Other specified complication of vascular prosthetic devices, implants and grafts, initial encounter: Secondary | ICD-10-CM | POA: Insufficient documentation

## 2019-11-15 DIAGNOSIS — Y832 Surgical operation with anastomosis, bypass or graft as the cause of abnormal reaction of the patient, or of later complication, without mention of misadventure at the time of the procedure: Secondary | ICD-10-CM | POA: Diagnosis not present

## 2019-11-15 HISTORY — PX: REMOVAL OF A DIALYSIS CATHETER: SHX6053

## 2019-11-15 HISTORY — PX: INSERTION OF DIALYSIS CATHETER: SHX1324

## 2019-11-15 LAB — POCT I-STAT, CHEM 8
BUN: 49 mg/dL — ABNORMAL HIGH (ref 8–23)
Calcium, Ion: 1 mmol/L — ABNORMAL LOW (ref 1.15–1.40)
Chloride: 105 mmol/L (ref 98–111)
Creatinine, Ser: 8.8 mg/dL — ABNORMAL HIGH (ref 0.61–1.24)
Glucose, Bld: 92 mg/dL (ref 70–99)
HCT: 40 % (ref 39.0–52.0)
Hemoglobin: 13.6 g/dL (ref 13.0–17.0)
Potassium: 4.6 mmol/L (ref 3.5–5.1)
Sodium: 141 mmol/L (ref 135–145)
TCO2: 23 mmol/L (ref 22–32)

## 2019-11-15 SURGERY — REMOVAL, DIALYSIS CATHETER
Anesthesia: General | Site: Chest | Laterality: Right

## 2019-11-15 MED ORDER — LIDOCAINE 2% (20 MG/ML) 5 ML SYRINGE
INTRAMUSCULAR | Status: DC | PRN
  Administered 2019-11-15: 100 mg via INTRAVENOUS

## 2019-11-15 MED ORDER — LIDOCAINE 2% (20 MG/ML) 5 ML SYRINGE
INTRAMUSCULAR | Status: AC
  Filled 2019-11-15: qty 5

## 2019-11-15 MED ORDER — CHLORHEXIDINE GLUCONATE 4 % EX LIQD: 60.0000 mL | Freq: Once | CUTANEOUS | Status: DC

## 2019-11-15 MED ORDER — PROPOFOL 10 MG/ML IV BOLUS
INTRAVENOUS | Status: AC
  Filled 2019-11-15: qty 20

## 2019-11-15 MED ORDER — HEPARIN SODIUM (PORCINE) 1000 UNIT/ML IJ SOLN
INTRAMUSCULAR | Status: DC | PRN
  Administered 2019-11-15: 3400 [IU]

## 2019-11-15 MED ORDER — CEFAZOLIN SODIUM-DEXTROSE 2-4 GM/100ML-% IV SOLN
2.0000 g | INTRAVENOUS | Status: AC
  Administered 2019-11-15: 2 g via INTRAVENOUS
  Filled 2019-11-15: qty 100

## 2019-11-15 MED ORDER — HEPARIN SODIUM (PORCINE) 1000 UNIT/ML IJ SOLN
INTRAMUSCULAR | Status: AC
  Filled 2019-11-15: qty 1

## 2019-11-15 MED ORDER — LIDOCAINE HCL (PF) 1 % IJ SOLN
INTRAMUSCULAR | Status: AC
  Filled 2019-11-15: qty 30

## 2019-11-15 MED ORDER — SODIUM CHLORIDE 0.9 % IV SOLN
INTRAVENOUS | Status: AC
  Filled 2019-11-15: qty 1.2

## 2019-11-15 MED ORDER — MIDAZOLAM HCL 2 MG/2ML IJ SOLN
INTRAMUSCULAR | Status: AC
  Filled 2019-11-15: qty 2

## 2019-11-15 MED ORDER — MIDAZOLAM HCL 5 MG/5ML IJ SOLN
INTRAMUSCULAR | Status: DC | PRN
  Administered 2019-11-15: 1 mg via INTRAVENOUS

## 2019-11-15 MED ORDER — FENTANYL CITRATE (PF) 100 MCG/2ML IJ SOLN
INTRAMUSCULAR | Status: DC | PRN
  Administered 2019-11-15: 50 ug via INTRAVENOUS

## 2019-11-15 MED ORDER — SODIUM CHLORIDE 0.9 % IV SOLN
INTRAVENOUS | Status: DC | PRN
  Administered 2019-11-15: 500 mL

## 2019-11-15 MED ORDER — GLYCOPYRROLATE PF 0.2 MG/ML IJ SOSY
PREFILLED_SYRINGE | INTRAMUSCULAR | Status: AC
  Filled 2019-11-15: qty 1

## 2019-11-15 MED ORDER — LIDOCAINE-EPINEPHRINE 1 %-1:100000 IJ SOLN
INTRAMUSCULAR | Status: AC
  Filled 2019-11-15: qty 1

## 2019-11-15 MED ORDER — SODIUM CHLORIDE 0.9 % IV SOLN: INTRAVENOUS | Status: DC

## 2019-11-15 MED ORDER — PROPOFOL 10 MG/ML IV BOLUS
INTRAVENOUS | Status: DC | PRN
  Administered 2019-11-15: 120 mg via INTRAVENOUS
  Administered 2019-11-15: 30 mg via INTRAVENOUS
  Administered 2019-11-15: 20 mg via INTRAVENOUS

## 2019-11-15 MED ORDER — FENTANYL CITRATE (PF) 250 MCG/5ML IJ SOLN
INTRAMUSCULAR | Status: AC
  Filled 2019-11-15: qty 5

## 2019-11-15 SURGICAL SUPPLY — 39 items
BAG DECANTER FOR FLEXI CONT (MISCELLANEOUS) ×4 IMPLANT
BIOPATCH RED 1 DISK 7.0 (GAUZE/BANDAGES/DRESSINGS) ×3 IMPLANT
BIOPATCH RED 1IN DISK 7.0MM (GAUZE/BANDAGES/DRESSINGS) ×1
CATH PALINDROME RT-P 15FX19CM (CATHETERS) IMPLANT
CATH PALINDROME RT-P 15FX23CM (CATHETERS) ×4 IMPLANT
CATH PALINDROME RT-P 15FX28CM (CATHETERS) IMPLANT
CATH PALINDROME RT-P 15FX55CM (CATHETERS) IMPLANT
COVER PROBE W GEL 5X96 (DRAPES) ×4 IMPLANT
COVER SURGICAL LIGHT HANDLE (MISCELLANEOUS) ×4 IMPLANT
COVER WAND RF STERILE (DRAPES) IMPLANT
DERMABOND ADVANCED (GAUZE/BANDAGES/DRESSINGS) ×4
DERMABOND ADVANCED .7 DNX12 (GAUZE/BANDAGES/DRESSINGS) ×4 IMPLANT
DRAPE C-ARM 42X72 X-RAY (DRAPES) ×4 IMPLANT
DRAPE CHEST BREAST 15X10 FENES (DRAPES) ×4 IMPLANT
GAUZE 4X4 16PLY RFD (DISPOSABLE) ×4 IMPLANT
GAUZE SPONGE 2X2 8PLY STRL LF (GAUZE/BANDAGES/DRESSINGS) IMPLANT
GLOVE BIO SURGEON STRL SZ7.5 (GLOVE) ×4 IMPLANT
GOWN STRL REUS W/ TWL LRG LVL3 (GOWN DISPOSABLE) ×2 IMPLANT
GOWN STRL REUS W/ TWL XL LVL3 (GOWN DISPOSABLE) ×2 IMPLANT
GOWN STRL REUS W/TWL LRG LVL3 (GOWN DISPOSABLE) ×2
GOWN STRL REUS W/TWL XL LVL3 (GOWN DISPOSABLE) ×2
KIT BASIN OR (CUSTOM PROCEDURE TRAY) ×4 IMPLANT
KIT TURNOVER KIT B (KITS) ×4 IMPLANT
NEEDLE 18GX1X1/2 (RX/OR ONLY) (NEEDLE) ×4 IMPLANT
NEEDLE HYPO 25GX1X1/2 BEV (NEEDLE) ×4 IMPLANT
NS IRRIG 1000ML POUR BTL (IV SOLUTION) ×4 IMPLANT
PACK SURGICAL SETUP 50X90 (CUSTOM PROCEDURE TRAY) ×4 IMPLANT
PAD ARMBOARD 7.5X6 YLW CONV (MISCELLANEOUS) ×8 IMPLANT
SOAP 2 % CHG 4 OZ (WOUND CARE) ×4 IMPLANT
SPONGE GAUZE 2X2 STER 10/PKG (GAUZE/BANDAGES/DRESSINGS)
SUT ETHILON 3 0 PS 1 (SUTURE) ×4 IMPLANT
SUT MNCRL AB 4-0 PS2 18 (SUTURE) ×8 IMPLANT
SYR 10ML LL (SYRINGE) ×4 IMPLANT
SYR 20ML LL LF (SYRINGE) ×8 IMPLANT
SYR 5ML LL (SYRINGE) ×4 IMPLANT
SYR CONTROL 10ML LL (SYRINGE) ×4 IMPLANT
TOWEL GREEN STERILE (TOWEL DISPOSABLE) ×4 IMPLANT
TOWEL GREEN STERILE FF (TOWEL DISPOSABLE) ×8 IMPLANT
WATER STERILE IRR 1000ML POUR (IV SOLUTION) ×4 IMPLANT

## 2019-11-15 NOTE — Transfer of Care (Signed)
Immediate Anesthesia Transfer of Care Note  Patient: Devin Harmon  Procedure(s) Performed: REMOVAL OF A RIGHT TUNNEL DIALYSIS CATHETER (Right Chest) INSERTION OF LEFT TUNNEL DIALYSIS CATHETER (Left Chest)  Patient Location: PACU  Anesthesia Type:General  Level of Consciousness: drowsy and patient cooperative  Airway & Oxygen Therapy: Patient Spontanous Breathing and Patient connected to nasal cannula oxygen  Post-op Assessment: Report given to RN, Post -op Vital signs reviewed and stable and Patient moving all extremities  Post vital signs: Reviewed and stable  Last Vitals:  Vitals Value Taken Time  BP 108/64 11/15/19 0816  Temp    Pulse 51 11/15/19 0819  Resp 26 11/15/19 0819  SpO2 92 % 11/15/19 0819  Vitals shown include unvalidated device data.  Last Pain:  Vitals:   11/15/19 0641  TempSrc:   PainSc: 0-No pain      Patients Stated Pain Goal: 7 ([id number])  Complications: No apparent anesthesia complications

## 2019-11-15 NOTE — Anesthesia Postprocedure Evaluation (Signed)
Anesthesia Post Note  Patient: Devin Harmon  Procedure(s) Performed: REMOVAL OF A RIGHT TUNNEL DIALYSIS CATHETER (Right Chest) INSERTION OF LEFT TUNNEL DIALYSIS CATHETER (Left Chest)     Patient location during evaluation: PACU Anesthesia Type: General Level of consciousness: awake and alert Pain management: pain level controlled Vital Signs Assessment: post-procedure vital signs reviewed and stable Respiratory status: spontaneous breathing, nonlabored ventilation, respiratory function stable and patient connected to nasal cannula oxygen Cardiovascular status: blood pressure returned to baseline and stable Postop Assessment: no apparent nausea or vomiting Anesthetic complications: no    Last Vitals:  Vitals:   11/15/19 0916 11/15/19 0918  BP: (!) 143/74   Pulse: (!) 54 (!) 55  Resp: 18 16  Temp: 36.8 C   SpO2: 94% 95%    Last Pain:  Vitals:   11/15/19 0915  TempSrc:   PainSc: 0-No pain                 Lowen Mansouri DAVID

## 2019-11-15 NOTE — Anesthesia Preprocedure Evaluation (Addendum)
Anesthesia Evaluation  Patient identified by MRN, date of birth, ID band Patient awake    Reviewed: Allergy & Precautions, NPO status , Patient's Chart, lab work & pertinent test results, reviewed documented beta blocker date and time   Airway Mallampati: III  TM Distance: >3 FB Neck ROM: Limited    Dental  (+) Missing,    Pulmonary    Pulmonary exam normal        Cardiovascular hypertension, Pt. on medications and Pt. on home beta blockers Normal cardiovascular exam     Neuro/Psych    GI/Hepatic   Endo/Other    Renal/GU Renal InsufficiencyRenal disease     Musculoskeletal   Abdominal   Peds  Hematology   Anesthesia Other Findings   Reproductive/Obstetrics                            Anesthesia Physical Anesthesia Plan  ASA: III  Anesthesia Plan: General   Post-op Pain Management:    Induction:   PONV Risk Score and Plan: 2 and Midazolam, Ondansetron, Dexamethasone and Treatment may vary due to age or medical condition  Airway Management Planned: LMA  Additional Equipment: None  Intra-op Plan:   Post-operative Plan: Extubation in OR  Informed Consent: I have reviewed the patients History and Physical, chart, labs and discussed the procedure including the risks, benefits and alternatives for the proposed anesthesia with the patient or authorized representative who has indicated his/her understanding and acceptance.       Plan Discussed with: CRNA and Surgeon  Anesthesia Plan Comments:        Anesthesia Quick Evaluation

## 2019-11-15 NOTE — H&P (Signed)
   History and Physical Update  The patient was interviewed and re-examined.  The patient's previous History and Physical has been reviewed and is unchanged from recent office visit. Plan for conversion to left IJ tdc and removal of right. Will possibly need fistulogram in the near future if this does not resolve edema but would like fistula to be more mature first if possible.   Oakley Orban C. Donzetta Matters, MD [facility] [place] Office: [phone] Pager: [phone]   11/15/2019, 7:08 AM

## 2019-11-15 NOTE — Op Note (Signed)
    Patient name: Devin Harmon MRN: [id number] DOB: 1956/01/04 Sex: male  11/15/2019 Pre-operative Diagnosis: End-stage renal disease Post-operative diagnosis:  Same Surgeon:  Erlene Quan C. Donzetta Matters, MD Procedure Performed: 1.  Ultrasound-guided placement of 23 cm left IJ tunneled dialysis catheter 2.  Removal of right IJ tunneled dialysis catheter  Indications: 63 year old male with history of end-stage renal.  He has been on dialysis via right IJ catheter since May of this year.  He now has a fistula September.  He does have swelling in the right upper extremity he is indicated for removal of the right-sided catheter and placement of a new left-sided catheter.  Findings: Left IJ was large patent and compressible.  Placement of SVC atrial junction.  A right-sided catheter was removed.  There are significant collateralization veins of his chest that was bleeding from the right side that was controlled with compression.   Procedure:  The patient was identified in the holding area and taken to the operating room where is placed supine operative table general anesthesia induced.  He was sterilely prepped and draped in the bilateral neck and chest residual fashion antibiotics were administered and a timeout was called.  Ultrasound was used to identify the left IJ and this was cannulated with direct ultrasound visualization with an 18-gauge needle.  A wire was passed centrally.  A 23 cm catheter was tunneled from the lateral incision.  The wire tract was serially dilated and the introducer sheath was placed in a postop guidance.  The catheter was placed to the SVC atrial junction.  It was assembled and flushed with heparinized saline.  I turned my attention to the left side.  I dissected free the catheter that was existing up to the level of the cuff.  I then remove this with constant pressure.  There was some significant bleeding pressure was held.  I placed a 3-0 nylon figure-of-eight stitch at the exit site  placed Dermabond and there and held pressure for 5 minutes.  Hemostasis did appear to be obtained although there was some hematoma there.  We used fluoroscopy again identified that are left-sided catheter would not move with removal of the right-sided catheter.  When this was confirmed we then affixed the catheter to the skin with 3-0 nylon suture.  Neck incision was closed for Monocryl dermal was closed with size.  The catheter was locked with 1.7 cc of contrast heparin either port.  Sterile dressing was placed.  Patient was awakened anesthesia he did tolerate procedure well any complication.  All counts were correct at completion.  EBL: 50 cc   Eryka Dolinger C. Donzetta Matters, MD [facility] [place] Office: [phone] Pager: [phone]

## 2019-11-15 NOTE — Anesthesia Procedure Notes (Signed)
Procedure Name: LMA Insertion Date/Time: 11/15/2019 7:39 AM Performed by: Moshe Salisbury, CRNA Pre-anesthesia Checklist: Patient identified, Emergency Drugs available, Suction available and Patient being monitored Patient Re-evaluated:Patient Re-evaluated prior to induction Oxygen Delivery Method: Circle System Utilized Preoxygenation: Pre-oxygenation with 100% oxygen Induction Type: IV induction Ventilation: Mask ventilation without difficulty LMA: LMA inserted LMA Size: 5.0 Number of attempts: 1 Placement Confirmation: positive ETCO2 Tube secured with: Tape Dental Injury: Teeth and Oropharynx as per pre-operative assessment

## 2019-11-17 ENCOUNTER — Other Ambulatory Visit: Payer: Self-pay | Admitting: Internal Medicine

## 2019-12-09 ENCOUNTER — Encounter: Payer: Self-pay | Admitting: Family

## 2019-12-09 ENCOUNTER — Other Ambulatory Visit: Payer: Self-pay

## 2019-12-09 ENCOUNTER — Ambulatory Visit (INDEPENDENT_AMBULATORY_CARE_PROVIDER_SITE_OTHER): Payer: Self-pay | Admitting: Family

## 2019-12-09 VITALS — BP 176/92 | HR 66 | Temp 96.9°F | Resp 20 | Ht 67.0 in | Wt 180.0 lb

## 2019-12-09 DIAGNOSIS — R600 Localized edema: Secondary | ICD-10-CM

## 2019-12-09 DIAGNOSIS — N186 End stage renal disease: Secondary | ICD-10-CM

## 2019-12-09 DIAGNOSIS — I770 Arteriovenous fistula, acquired: Secondary | ICD-10-CM

## 2019-12-09 DIAGNOSIS — Z992 Dependence on renal dialysis: Secondary | ICD-10-CM

## 2019-12-09 NOTE — Progress Notes (Signed)
CC: follow up right arm swelling after right IJ TDC was removed  History of Present Illness  Devin Harmon is a 64 y.o. ([birth year]) male who is s/p removal of right IJ tunneled dialysis catheter and placement of 23 cm left IJ tunneled dialysis catheter on 11-15-19 by Dr. Donzetta Matters. Pt had swelling in his RUE.  Pt is also s/p rightbrachiocephalic arteriovenous fistula placement on 09-01-19 by Dr. Carlis Abbott.  This is his first permanent HD access  He returns today for scheduled follow up for evaluation of right arm swelling since the right IJ TDC was removed.  Wife states there has been no decrease of swelling in his right arm.  Wife states that pt has not complained of any problems with his right hand.   Pt denies shortness of breath, denies chest pain, denies fever or chills.   I spoke with his wife by phone, pt is difficult to understand. She states that pt dialyzes MWF via left IJ TDC at [facility] in [place].  Past Medical History:  Diagnosis Date  . Allergy   . Anemia   . BPH (benign prostatic hypertrophy)   . Hypertension   . Renal insufficiency   . Zoster 2007   facial, hospital 2007    Social History Social History   Tobacco Use  . Smoking status: Never Smoker  . Smokeless tobacco: Never Used  Substance Use Topics  . Alcohol use: Not Currently    Comment: occasional but none since 2019  . Drug use: Not Currently    Family History Family History  Problem Relation Age of Onset  . Hypertension Mother   . Cancer Mother        lung cancer  . Hypertension Brother   . Heart disease Father   . Coronary artery disease Neg Hx   . Diabetes Neg Hx     Surgical History Past Surgical History:  Procedure Laterality Date  . AV FISTULA PLACEMENT Right 09/01/2019   Procedure: ARTERIOVENOUS (AV) FISTULA CREATION RIGHT ARM;  Surgeon: Marty Heck, MD;  Location: [place];  Service: Vascular;  Laterality: Right;  . DIALYSIS/PERMA CATHETER INSERTION N/A 04/15/2019   Procedure: DIALYSIS/PERMA CATHETER INSERTION;  Surgeon: Katha Cabal, MD;  Location: [place] [facility];  Service: Cardiovascular;  Laterality: N/A;  . INSERTION OF DIALYSIS CATHETER Left 11/15/2019   Procedure: INSERTION OF LEFT TUNNEL DIALYSIS CATHETER;  Surgeon: Waynetta Sandy, MD;  Location: [place];  Service: Vascular;  Laterality: Left;  . REMOVAL OF A DIALYSIS CATHETER Right 11/15/2019   Procedure: REMOVAL OF A RIGHT TUNNEL DIALYSIS CATHETER;  Surgeon: Waynetta Sandy, MD;  Location: [place];  Service: Vascular;  Laterality: Right;    Allergies  Allergen Reactions  . Chlorthalidone Other (See Comments)    felt "bad" and ED   . Hydrocodone Nausea Only    dizzy  . Sulfa Antibiotics Hives  . Sulfonamide Derivatives Hives    Current Outpatient Medications  Medication Sig Dispense Refill  . acetaminophen (TYLENOL) 500 MG tablet Take 500-1,000 mg by mouth every 6 (six) hours as needed for mild pain.    Marland Kitchen amLODipine (NORVASC) 10 MG tablet TAKE 1 TABLET BY MOUTH EVERY DAY 90 tablet 3  . AURYXIA 1 GM 210 MG(Fe) tablet Take 420 mg by mouth 3 (three) times daily with meals.     . B Complex-C-Folic Acid (RENA-VITE RX) 1 MG TABS Take 1 tablet by mouth daily.    . cetirizine (ZYRTEC) 10 MG tablet Take  10 mg by mouth every evening.     . fluticasone (FLONASE) 50 MCG/ACT nasal spray Place 1-2 sprays into both nostrils daily as needed for allergies.     . hydrocortisone cream 1 % Apply 1 application topically daily as needed (rash).    . labetalol (NORMODYNE) 100 MG tablet TAKE 1 TABLET BY MOUTH TWICE A DAY 180 tablet 3  . neomycin-bacitracin-polymyxin (NEOSPORIN) ointment Apply 1 application topically as needed for wound care.    . sodium bicarbonate 650 MG tablet Take 1 tablet (650 mg total) by mouth 3 (three) times daily. 270 tablet 3  . traMADol (ULTRAM) 50 MG tablet Take 1 tablet (50 mg total) by mouth every 6 (six) hours as needed. 15 tablet 0  . losartan (COZAAR)  100 MG tablet Take 1 tablet (100 mg total) by mouth at bedtime. (Patient not taking: Reported on 11/11/2019) 30 tablet 1   No current facility-administered medications for this visit.     REVIEW OF SYSTEMS: see HPI for pertinent positives and negatives    PHYSICAL EXAMINATION:  Vitals:   12/09/19 1014  BP: (!) 176/92  Pulse: 66  Resp: 20  Temp: (!) 96.9 F (36.1 C)  TempSrc: Temporal  SpO2: 97%  Weight: 180 lb (81.6 kg)  Height: 5\' 7"  (1.702 m)   Body mass index is 28.19 kg/m.  General: The patient appears his stated age 87:  No gross abnormalities Pulmonary: Respirations are slightly labored at rest, limited air movement in all posterior fields, rales in lower 2/3 of both posterior fields. No rhonchi or wheezes Abdomen: Soft and non-tender with normal bowel sounds. Musculoskeletal: There are no major deformities. Right arm is edematous and larger than left arm, see photo below Neurologic: No focal weakness or paresthesias are detected. Speech is mostly unintelligible. Pt seems to understand me and respond appropriately.  Skin: There are no ulcer or rashes noted. Psychiatric: The patient has a flat affect. Cardiovascular: There is a regular rate and rhythm without significant murmur appreciated.   Right arm AVF with faint bruit and thrill. Right radial pulse is 2+ palpable.      Non-Invasive Vascular Imaging  right arm Access Duplex  (Date: 10-11-19):    Findings: +--------------------+----------+-----------------+--------+ AVF                 PSV (cm/s)Flow Vol (mL/min)Comments +--------------------+----------+-----------------+--------+ Native artery inflow   270          1412                +--------------------+----------+-----------------+--------+ AVF Anastomosis        200                              +--------------------+----------+-----------------+--------+    +------------+----------+-------------+----------+--------------------+  OUTFLOW VEINPSV (cm/s)Diameter (cm)Depth (cm)      Describe       +------------+----------+-------------+----------+--------------------+ Prox UA        458        0.61        1.09   branch 0.27and 0.287 +------------+----------+-------------+----------+--------------------+ Mid UA         461        0.54        0.57     branch 0.214 cm    +------------+----------+-------------+----------+--------------------+ Dist UA        139        0.90        0.77                        +------------+----------+-------------+----------+--------------------+  Summary: Patent right brachiocephalic AVF. Elevated velocities in the mid and proximal outflow vein at the confluence with branches. Significant interstitial fluid.      Medical Decision Making  Devin Harmon is a 64 y.o. male who is s/p removal of right IJ tunneled dialysis catheter and placement of 23 cm left IJ tunneled dialysis catheter on 11-15-19 by Dr. Donzetta Matters. Pt had swelling in his RUE.  Pt is also s/p rightbrachiocephalic arteriovenous fistula placement on 09-01-19 by Dr. Carlis Abbott.  This is his first permanent HD access  He returns today for scheduled follow up for evaluation of right arm swelling since the right IJ TDC was removed.  Wife states there has been no decrease of swelling in his right arm.  Wife states that pt has not complained of any problems with his right hand.    Right arm is larger than left arm and is edematous, no seeping or weeping. See photo above.   Duplex of right arm AVF done on 10-11-19 shows a patent right brachiocephalic AVF. Elevated velocities in the mid and proximal outflow vein at the confluence with branches. Significant interstitial fluid.  After discussing with Dr. Donzetta Matters, will schedule fistula gram of right arm AVF ASAP on a non HD day. [facility] scheduler will call wife to schedule.      [facility] [place] Office: [phone]  12/09/2019, 10:55 AM   Clinic MD: Donzetta Matters

## 2019-12-12 ENCOUNTER — Other Ambulatory Visit: Payer: Self-pay

## 2019-12-16 ENCOUNTER — Other Ambulatory Visit (HOSPITAL_COMMUNITY)
Admission: RE | Admit: 2019-12-16 | Discharge: 2019-12-16 | Disposition: A | Discharge status: Home / Self Care | Payer: 59 | Source: Ambulatory Visit | Attending: Vascular Surgery | Admitting: Vascular Surgery

## 2019-12-16 DIAGNOSIS — Z01812 Encounter for preprocedural laboratory examination: Secondary | ICD-10-CM | POA: Insufficient documentation

## 2019-12-16 DIAGNOSIS — Z20822 Contact with and (suspected) exposure to covid-19: Secondary | ICD-10-CM | POA: Insufficient documentation

## 2019-12-16 LAB — SARS CORONAVIRUS 2 (TAT 6-24 HRS): SARS Coronavirus 2: NEGATIVE

## 2019-12-19 ENCOUNTER — Ambulatory Visit (HOSPITAL_COMMUNITY)
Admission: RE | Admit: 2019-12-19 | Discharge: 2019-12-19 | Disposition: A | Discharge status: Home / Self Care | Payer: 59 | Attending: Vascular Surgery | Admitting: Vascular Surgery

## 2019-12-19 ENCOUNTER — Other Ambulatory Visit: Payer: Self-pay

## 2019-12-19 ENCOUNTER — Encounter (HOSPITAL_COMMUNITY)
Admission: RE | Disposition: A | Discharge status: Home / Self Care | Payer: Self-pay | Source: Home / Self Care | Attending: Vascular Surgery

## 2019-12-19 DIAGNOSIS — T82858A Stenosis of vascular prosthetic devices, implants and grafts, initial encounter: Secondary | ICD-10-CM

## 2019-12-19 DIAGNOSIS — M7989 Other specified soft tissue disorders: Secondary | ICD-10-CM | POA: Insufficient documentation

## 2019-12-19 DIAGNOSIS — Z79899 Other long term (current) drug therapy: Secondary | ICD-10-CM | POA: Diagnosis not present

## 2019-12-19 DIAGNOSIS — Z992 Dependence on renal dialysis: Secondary | ICD-10-CM | POA: Insufficient documentation

## 2019-12-19 DIAGNOSIS — N186 End stage renal disease: Secondary | ICD-10-CM

## 2019-12-19 DIAGNOSIS — I120 Hypertensive chronic kidney disease with stage 5 chronic kidney disease or end stage renal disease: Secondary | ICD-10-CM | POA: Diagnosis not present

## 2019-12-19 HISTORY — PX: PERIPHERAL VASCULAR INTERVENTION: CATH118257

## 2019-12-19 HISTORY — PX: A/V FISTULAGRAM: CATH118298

## 2019-12-19 LAB — POCT I-STAT, CHEM 8
BUN: 57 mg/dL — ABNORMAL HIGH (ref 8–23)
Calcium, Ion: 1.04 mmol/L — ABNORMAL LOW (ref 1.15–1.40)
Chloride: 100 mmol/L (ref 98–111)
Creatinine, Ser: 9.8 mg/dL — ABNORMAL HIGH (ref 0.61–1.24)
Glucose, Bld: 90 mg/dL (ref 70–99)
HCT: 37 % — ABNORMAL LOW (ref 39.0–52.0)
Hemoglobin: 12.6 g/dL — ABNORMAL LOW (ref 13.0–17.0)
Potassium: 5.3 mmol/L — ABNORMAL HIGH (ref 3.5–5.1)
Sodium: 136 mmol/L (ref 135–145)
TCO2: 25 mmol/L (ref 22–32)

## 2019-12-19 SURGERY — A/V FISTULAGRAM
Anesthesia: LOCAL | Laterality: Right

## 2019-12-19 MED ORDER — SODIUM CHLORIDE 0.9% FLUSH: 3.0000 mL | INTRAVENOUS | Status: DC | PRN

## 2019-12-19 MED ORDER — LIDOCAINE HCL (PF) 1 % IJ SOLN
INTRAMUSCULAR | Status: DC | PRN
  Administered 2019-12-19: 20 mL via INTRADERMAL
  Administered 2019-12-19: 2 mL via INTRADERMAL

## 2019-12-19 MED ORDER — HEPARIN (PORCINE) IN NACL 1000-0.9 UT/500ML-% IV SOLN
INTRAVENOUS | Status: DC | PRN
  Administered 2019-12-19: 500 mL

## 2019-12-19 MED ORDER — LIDOCAINE HCL (PF) 1 % IJ SOLN
INTRAMUSCULAR | Status: AC
  Filled 2019-12-19: qty 30

## 2019-12-19 MED ORDER — HEPARIN (PORCINE) IN NACL 1000-0.9 UT/500ML-% IV SOLN
INTRAVENOUS | Status: AC
  Filled 2019-12-19: qty 500

## 2019-12-19 MED ORDER — SODIUM CHLORIDE 0.9% FLUSH: 3.0000 mL | Freq: Two times a day (BID) | INTRAVENOUS | Status: DC

## 2019-12-19 MED ORDER — SODIUM CHLORIDE 0.9 % IV SOLN: 250.0000 mL | INTRAVENOUS | Status: DC | PRN

## 2019-12-19 MED ORDER — IODIXANOL 320 MG/ML IV SOLN
INTRAVENOUS | Status: DC | PRN
  Administered 2019-12-19: 80 mL

## 2019-12-19 SURGICAL SUPPLY — 16 items
BAG SNAP BAND KOVER 36X36 (MISCELLANEOUS) ×3 IMPLANT
CATH ANGIO 5F BER2 100CM (CATHETERS) ×3 IMPLANT
CATH ANGIO 5F BER2 65CM (CATHETERS) ×3 IMPLANT
COVER DOME SNAP 22 D (MISCELLANEOUS) ×3 IMPLANT
DEVICE TORQUE .025-.038 (MISCELLANEOUS) ×3 IMPLANT
GUIDEWIRE ANGLED .035X150CM (WIRE) ×3 IMPLANT
KIT MICROPUNCTURE NIT STIFF (SHEATH) ×3 IMPLANT
PROTECTION STATION PRESSURIZED (MISCELLANEOUS) ×3
SHEATH PINNACLE 5F 10CM (SHEATH) ×6 IMPLANT
SHEATH PROBE COVER 6X72 (BAG) ×6 IMPLANT
STATION PROTECTION PRESSURIZED (MISCELLANEOUS) ×2 IMPLANT
STOPCOCK MORSE 400PSI 3WAY (MISCELLANEOUS) ×3 IMPLANT
TRAY PV CATH (CUSTOM PROCEDURE TRAY) ×3 IMPLANT
TUBING CIL FLEX 10 FLL-RA (TUBING) ×3 IMPLANT
WIRE BENTSON .035X145CM (WIRE) ×3 IMPLANT
WIRE TORQFLEX AUST .018X40CM (WIRE) ×9 IMPLANT

## 2019-12-19 NOTE — H&P (Signed)
   History and Physical Update  The patient was interviewed and re-examined.  The patient's previous History and Physical has been reviewed and is unchanged from recent office visit. Plan right arm fistulogram.  I discussed with patient and family that may require groin access as well.  They demonstrate good understanding.  Ahan Eisenberger C. Donzetta Matters, MD [facility] [place] Office: [phone] Pager: [phone]  12/19/2019, 12:05 PM

## 2019-12-19 NOTE — Op Note (Signed)
    Patient name: Devin Harmon MRN: [id number] DOB: 10-Feb-1956 Sex: male  12/19/2019 Pre-operative Diagnosis: esrd, edema right upper extremity Post-operative diagnosis:  Same Surgeon:  Eda Paschal. Donzetta Matters, MD Procedure Performed: 1.  Ultrasound-guided cannulation right arm AV fistula 2.  Right upper extremity fistulogram 3.  Ultrasound-guided cannulation right common femoral vein 4.  Central venogram  Indications: 64 year old male has history of right upper extremity fistula creation.  Subsequently has significant right upper extremity swelling.  Catheter has been removed to the left IJ in hopes of improving the swelling.  Unfortunately has persistent swelling the right upper extremity is indicated for fistulogram possible invention.  Findings: Fistula itself was large was patent to the level of the cephalic arch.  There there were multiple collaterals making evaluating the fistula further difficult.  We were able to get a wire to the level of the subclavian vein where it was then occluded at the subclavian innominate junction.  Ultrasound was used to go from the femoral vein up to the innominate vein.  Unfortunately there was no knob of the left subclavian vein.  Even using the back of a Glidewire was unable to cross.  Patient was unable to tolerate the procedure.  Patient will need ligation of right arm fistula.   Procedure:  The patient was identified in the holding area and taken to room 8.  The patient was then placed supine on the table and prepped and draped in the usual sterile fashion.  A time out was called.  Ultrasound was used to evaluate the Right arm avf.  This was noted to be patent.  I initially attempted to cannulate with micropuncture needle and wire but ultimately used a 18-gauge needle a Bentson wire.  A micropuncture sheath was placed.  Right upper extremity fistulogram was performed.  With the above findings I exchanged for a 5 French sheath.  I use Glidewire including the back  of the wire with a bare catheter could not get into the true lumen.  I then used ultrasound to cannulate the common femoral vein.  And images saved the permanent record as the vein was noted to be patent and compressible.  This was cannulated gauge needle followed by Bentson wire and 5 French sheath.  Longer back catheter was used to get into the innominate vein and central venogram was performed.  Attempted to use the Glidewire but could not get anywhere but in collaterals.  I attempted multiple times from above and below could not get intraluminal access through the occluded segment.  Patient had difficulty tolerating the procedure did not seem to understand simple commands.  I elected to terminate the procedure without further attempts and he will need right upper extremity fistula ligated.  Contrast: 80cc  Darrik Richman C. Donzetta Matters, MD [facility] Office: [phone] Pager: [phone]

## 2019-12-19 NOTE — Discharge Instructions (Signed)
Dialysis Fistulogram, Care After This sheet gives you information about how to care for yourself after your procedure. Your health care provider may also give you more specific instructions. If you have problems or questions, contact your health care provider. What can I expect after the procedure? After the procedure, it is common to have:  A small amount of discomfort in the area where the small, thin tube (catheter) was placed for the procedure.  A small amount of bruising around the fistula.  Sleepiness and tiredness (fatigue). Follow these instructions at home: Activity   Rest at home and do not lift anything that is heavier than 5 lb (2.3 kg) on the day after your procedure.  Return to your normal activities as told by your health care provider. Ask your health care provider what activities are safe for you.  Do not drive or use heavy machinery while taking prescription pain medicine.  Do not drive for 24 hours if you were given a medicine to help you relax (sedative) during your procedure. Medicines   Take over-the-counter and prescription medicines only as told by your health care provider. Puncture site care  Follow instructions from your health care provider about how to take care of the site where catheters were inserted. Make sure you: ? Wash your hands with soap and water before you change your bandage (dressing). If soap and water are not available, use hand sanitizer. ? Remove your dressing as told by your health care provider. In 24-48 hours ? Leave stitches (sutures), skin glue, or adhesive strips in place. These skin closures may need to stay in place for 2 weeks or longer. If adhesive strip edges start to loosen and curl up, you may trim the loose edges. Do not remove adhesive strips completely unless your health care provider tells you to do that.  Check your puncture area every day for signs of infection. Check for: ? Redness, swelling, or pain. ? Fluid or  blood. ? Warmth. ? Pus or a bad smell. General instructions  Do not take baths, swim, or use a hot tub until your health care provider approves. Ask your health care provider if you may take showers. You may only be allowed to take sponge baths.  Monitor your dialysis fistula closely. Check to make sure that you can feel a vibration or buzz (a thrill) when you put your fingers over the fistula.  Prevent damage to your graft or fistula: ? Do not wear tight-fitting clothing or jewelry on the arm or leg that has your graft or fistula. ? Tell all your health care providers that you have a dialysis fistula or graft. ? Do not allow blood draws, IVs, or blood pressure readings to be done in the arm that has your fistula or graft. ? Do not allow flu shots or vaccinations in the arm with your fistula or graft.  Keep all follow-up visits as told by your health care provider. This is important. Contact a health care provider if:  You have redness, swelling, or pain at the site where the catheter was put in.  You have fluid or blood coming from the catheter site.  The catheter site feels warm to the touch.  You have pus or a bad smell coming from the catheter site.  You have a fever or chills. Get help right away if:  You feel weak.  You have trouble balancing.  You have trouble moving your arms or legs.  You have problems with your speech or  vision.  You can no longer feel a vibration or buzz when you put your fingers over your dialysis fistula.  The limb that was used for the procedure: ? Swells. ? Is painful. ? Is cold. ? Is discolored, such as blue or pale white.  You have chest pain or shortness of breath. Summary  After a dialysis fistulogram, it is common to have a small amount of discomfort or bruising in the area where the small, thin tube (catheter) was placed.  Rest at home on the day after your procedure. Return to your normal activities as told by your health care  provider.  Take over-the-counter and prescription medicines only as told by your health care provider.  Follow instructions from your health care provider about how to take care of the site where the catheter was inserted.  Keep all follow-up visits as told by your health care provider. This information is not intended to replace advice given to you by your health care provider. Make sure you discuss any questions you have with your health care provider. Document Revised: 12/18/2017 Document Reviewed: 12/18/2017 Elsevier Patient Education  [place].  Femoral Site Care This sheet gives you information about how to care for yourself after your procedure. Your health care provider may also give you more specific instructions. If you have problems or questions, contact your health care provider. What can I expect after the procedure? After the procedure, it is common to have:  Bruising that usually fades within 1-2 weeks.  Tenderness at the site. Follow these instructions at home: Wound care  Follow instructions from your health care provider about how to take care of your insertion site. Make sure you: ? Wash your hands with soap and water before you change your bandage (dressing). If soap and water are not available, use hand sanitizer. ? Remove your dressing as told by your health care provider. In 24-48 hours ? Leave stitches (sutures), skin glue, or adhesive strips in place. These skin closures may need to stay in place for 2 weeks or longer. If adhesive strip edges start to loosen and curl up, you may trim the loose edges. Do not remove adhesive strips completely unless your health care provider tells you to do that.  Do not take baths, swim, or use a hot tub until your health care provider approves.  You may shower 24-48 hours after the procedure or as told by your health care provider. ? Gently wash the site with plain soap and water. ? Pat the area dry with a clean  towel. ? Do not rub the site. This may cause bleeding.  Do not apply powder or lotion to the site. Keep the site clean and dry.  Check your femoral site every day for signs of infection. Check for: ? Redness, swelling, or pain. ? Fluid or blood. ? Warmth. ? Pus or a bad smell. Activity  For the first 2-3 days after your procedure, or as long as directed: ? Avoid climbing stairs as much as possible. ? Do not squat.  Do not lift anything that is heavier than 10 lb (4.5 kg), or the limit that you are told, until your health care provider says that it is safe.  Rest as directed. ? Avoid sitting for a long time without moving. Get up to take short walks every 1-2 hours.  Do not drive for 24 hours if you were given a medicine to help you relax (sedative). General instructions  Take over-the-counter and prescription  medicines only as told by your health care provider.  Keep all follow-up visits as told by your health care provider. This is important. Contact a health care provider if you have:  A fever or chills.  You have redness, swelling, or pain around your insertion site. Get help right away if:  The catheter insertion area swells very fast.  You pass out.  You suddenly start to sweat or your skin gets clammy.  The catheter insertion area is bleeding, and the bleeding does not stop when you hold steady pressure on the area.  The area near or just beyond the catheter insertion site becomes pale, cool, tingly, or numb. These symptoms may represent a serious problem that is an emergency. Do not wait to see if the symptoms will go away. Get medical help right away. Call your local emergency services (911 in the [place] Do not drive yourself to the hospital. Summary  After the procedure, it is common to have bruising that usually fades within 1-2 weeks.  Check your femoral site every day for signs of infection.  Do not lift anything that is heavier than 10 lb (4.5 kg), or  the limit that you are told, until your health care provider says that it is safe. This information is not intended to replace advice given to you by your health care provider. Make sure you discuss any questions you have with your health care provider. Document Revised: 11/30/2017 Document Reviewed: 11/30/2017 Elsevier Patient Education  2020 [employer].

## 2019-12-23 ENCOUNTER — Other Ambulatory Visit: Payer: Self-pay

## 2019-12-23 ENCOUNTER — Inpatient Hospital Stay (HOSPITAL_COMMUNITY): Admission: RE | Admit: 2019-12-23 | Payer: 59 | Source: Ambulatory Visit

## 2019-12-27 IMAGING — DX DG CHEST 1V PORT
1 series · 1 of 1 positions shown · non-contrast
Comparison: Chest x-ray 04/14/2019

CLINICAL DATA: Dialysis catheter placement.

EXAM:
PORTABLE CHEST 1 VIEW

[chest]
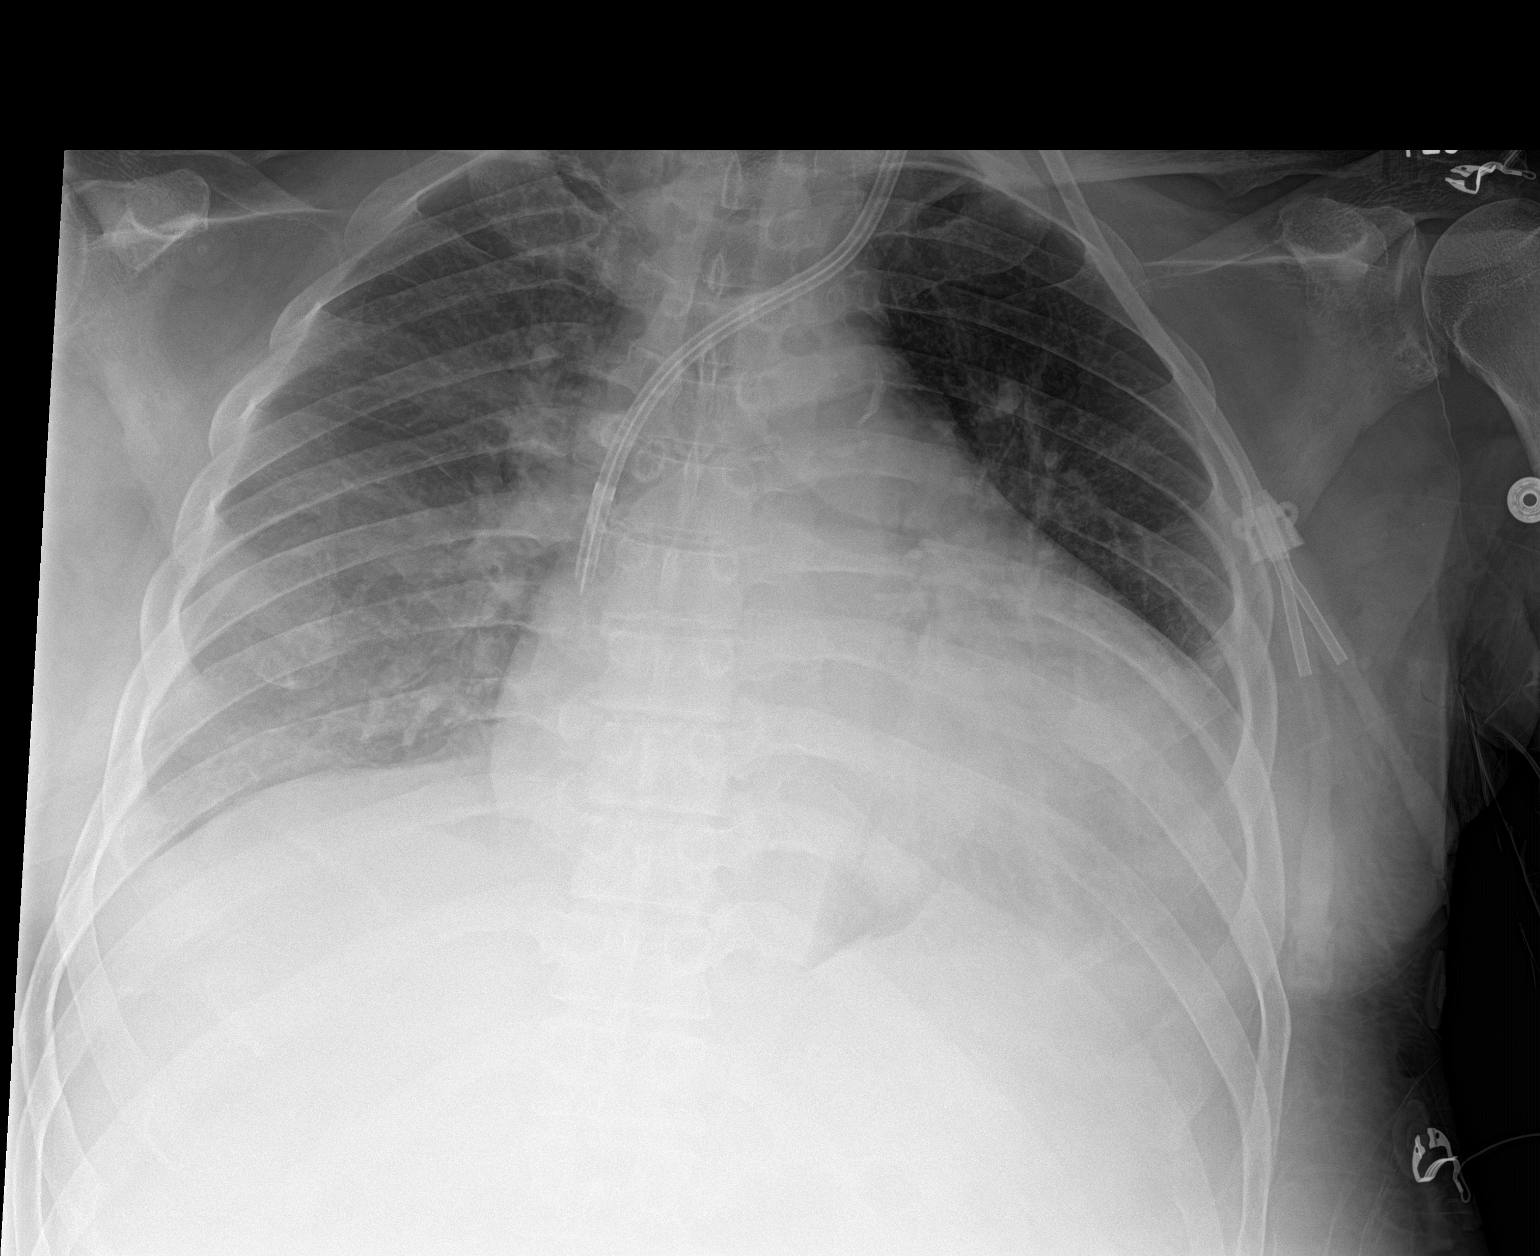

[1 of 1 positions shown; findings below may reference images not displayed]

FINDINGS: The left IJ dialysis catheter is in good position without
complicating features. The tips are in the distal SVC.

The heart is enlarged but appears stable. There is tortuosity and
calcification of the thoracic aorta. Mild vascular congestion
without overt pulmonary edema. No definite pleural effusions.
IMPRESSION: Left IJ dialysis catheter in good position without complicating
features.

Stable cardiac enlargement and mild vascular congestion. No overt
edema or definite pleural effusions.

## 2019-12-29 DIAGNOSIS — R197 Diarrhea, unspecified: Secondary | ICD-10-CM | POA: Insufficient documentation

## 2019-12-29 DIAGNOSIS — D631 Anemia in chronic kidney disease: Secondary | ICD-10-CM | POA: Insufficient documentation

## 2019-12-29 DIAGNOSIS — L299 Pruritus, unspecified: Secondary | ICD-10-CM | POA: Insufficient documentation

## 2019-12-29 DIAGNOSIS — D689 Coagulation defect, unspecified: Secondary | ICD-10-CM | POA: Insufficient documentation

## 2019-12-29 DIAGNOSIS — D509 Iron deficiency anemia, unspecified: Secondary | ICD-10-CM | POA: Insufficient documentation

## 2019-12-29 DIAGNOSIS — R52 Pain, unspecified: Secondary | ICD-10-CM | POA: Insufficient documentation

## 2019-12-29 DIAGNOSIS — I129 Hypertensive chronic kidney disease with stage 1 through stage 4 chronic kidney disease, or unspecified chronic kidney disease: Secondary | ICD-10-CM | POA: Insufficient documentation

## 2019-12-29 DIAGNOSIS — N2581 Secondary hyperparathyroidism of renal origin: Secondary | ICD-10-CM | POA: Insufficient documentation

## 2019-12-29 DIAGNOSIS — R0602 Shortness of breath: Secondary | ICD-10-CM | POA: Insufficient documentation

## 2019-12-30 ENCOUNTER — Other Ambulatory Visit (HOSPITAL_COMMUNITY)
Admission: RE | Admit: 2019-12-30 | Discharge: 2019-12-30 | Disposition: A | Discharge status: Home / Self Care | Payer: 59 | Source: Ambulatory Visit | Attending: Vascular Surgery | Admitting: Vascular Surgery

## 2019-12-30 ENCOUNTER — Encounter (HOSPITAL_COMMUNITY): Payer: Self-pay | Admitting: Vascular Surgery

## 2019-12-30 ENCOUNTER — Other Ambulatory Visit: Payer: Self-pay

## 2019-12-30 DIAGNOSIS — Z01812 Encounter for preprocedural laboratory examination: Secondary | ICD-10-CM | POA: Insufficient documentation

## 2019-12-30 DIAGNOSIS — Z20822 Contact with and (suspected) exposure to covid-19: Secondary | ICD-10-CM | POA: Insufficient documentation

## 2019-12-30 LAB — SARS CORONAVIRUS 2 (TAT 6-24 HRS): SARS Coronavirus 2: NEGATIVE

## 2019-12-30 NOTE — Progress Notes (Signed)
I spoke to [place] Fischel, patient's wife, who we have permission to talk to. Mr. Mountain is slurred at times and sometimes he will answer yes or no, sometimes he speaks in whole sentences. Mr Wehrman does not have chest pain or shortness of breath per Mrs. Schader. Mr Rempe was tested for Covid today , he will be in quarantine with his family until surgery.

## 2020-01-03 ENCOUNTER — Ambulatory Visit (HOSPITAL_COMMUNITY): Payer: 59

## 2020-01-03 ENCOUNTER — Encounter (HOSPITAL_COMMUNITY)
Admission: RE | Disposition: A | Discharge status: Home / Self Care | Payer: Self-pay | Source: Home / Self Care | Attending: Vascular Surgery

## 2020-01-03 ENCOUNTER — Encounter: Payer: 59 | Admitting: Internal Medicine

## 2020-01-03 ENCOUNTER — Ambulatory Visit (HOSPITAL_COMMUNITY)
Admission: RE | Admit: 2020-01-03 | Discharge: 2020-01-03 | Disposition: A | Discharge status: Home / Self Care | Payer: 59 | Attending: Vascular Surgery | Admitting: Vascular Surgery

## 2020-01-03 ENCOUNTER — Other Ambulatory Visit: Payer: Self-pay

## 2020-01-03 ENCOUNTER — Encounter (HOSPITAL_COMMUNITY): Payer: Self-pay | Admitting: Vascular Surgery

## 2020-01-03 DIAGNOSIS — N186 End stage renal disease: Secondary | ICD-10-CM | POA: Diagnosis not present

## 2020-01-03 DIAGNOSIS — Z882 Allergy status to sulfonamides status: Secondary | ICD-10-CM | POA: Diagnosis not present

## 2020-01-03 DIAGNOSIS — Y841 Kidney dialysis as the cause of abnormal reaction of the patient, or of later complication, without mention of misadventure at the time of the procedure: Secondary | ICD-10-CM | POA: Insufficient documentation

## 2020-01-03 DIAGNOSIS — M7989 Other specified soft tissue disorders: Secondary | ICD-10-CM | POA: Diagnosis not present

## 2020-01-03 DIAGNOSIS — Z885 Allergy status to narcotic agent status: Secondary | ICD-10-CM | POA: Insufficient documentation

## 2020-01-03 DIAGNOSIS — T82510A Breakdown (mechanical) of surgically created arteriovenous fistula, initial encounter: Secondary | ICD-10-CM | POA: Diagnosis not present

## 2020-01-03 DIAGNOSIS — Z8249 Family history of ischemic heart disease and other diseases of the circulatory system: Secondary | ICD-10-CM | POA: Insufficient documentation

## 2020-01-03 DIAGNOSIS — Z79899 Other long term (current) drug therapy: Secondary | ICD-10-CM | POA: Diagnosis not present

## 2020-01-03 DIAGNOSIS — I120 Hypertensive chronic kidney disease with stage 5 chronic kidney disease or end stage renal disease: Secondary | ICD-10-CM | POA: Diagnosis not present

## 2020-01-03 DIAGNOSIS — Z992 Dependence on renal dialysis: Secondary | ICD-10-CM | POA: Diagnosis not present

## 2020-01-03 DIAGNOSIS — N400 Enlarged prostate without lower urinary tract symptoms: Secondary | ICD-10-CM | POA: Diagnosis not present

## 2020-01-03 DIAGNOSIS — R2231 Localized swelling, mass and lump, right upper limb: Secondary | ICD-10-CM | POA: Diagnosis not present

## 2020-01-03 HISTORY — DX: End stage renal disease: N18.6

## 2020-01-03 HISTORY — PX: LIGATION OF ARTERIOVENOUS  FISTULA: SHX5948

## 2020-01-03 LAB — POCT I-STAT, CHEM 8
BUN: 28 mg/dL — ABNORMAL HIGH (ref 8–23)
Calcium, Ion: 0.94 mmol/L — ABNORMAL LOW (ref 1.15–1.40)
Chloride: 101 mmol/L (ref 98–111)
Creatinine, Ser: 6.5 mg/dL — ABNORMAL HIGH (ref 0.61–1.24)
Glucose, Bld: 80 mg/dL (ref 70–99)
HCT: 32 % — ABNORMAL LOW (ref 39.0–52.0)
Hemoglobin: 10.9 g/dL — ABNORMAL LOW (ref 13.0–17.0)
Potassium: 4 mmol/L (ref 3.5–5.1)
Sodium: 140 mmol/L (ref 135–145)
TCO2: 30 mmol/L (ref 22–32)

## 2020-01-03 SURGERY — LIGATION OF ARTERIOVENOUS  FISTULA
Anesthesia: Monitor Anesthesia Care | Site: Arm Upper | Laterality: Right

## 2020-01-03 MED ORDER — CHLORHEXIDINE GLUCONATE 4 % EX LIQD: 60.0000 mL | Freq: Once | CUTANEOUS | Status: DC

## 2020-01-03 MED ORDER — PROPOFOL 10 MG/ML IV BOLUS
INTRAVENOUS | Status: DC | PRN
  Administered 2020-01-03 (×2): 20 mg via INTRAVENOUS
  Administered 2020-01-03: 10 mg via INTRAVENOUS

## 2020-01-03 MED ORDER — FENTANYL CITRATE (PF) 100 MCG/2ML IJ SOLN
INTRAMUSCULAR | Status: DC | PRN
  Administered 2020-01-03: 25 ug via INTRAVENOUS

## 2020-01-03 MED ORDER — SODIUM CHLORIDE 0.9 % IV SOLN: INTRAVENOUS | Status: DC

## 2020-01-03 MED ORDER — PROPOFOL 500 MG/50ML IV EMUL
INTRAVENOUS | Status: DC | PRN
  Administered 2020-01-03: 75 ug/kg/min via INTRAVENOUS

## 2020-01-03 MED ORDER — FENTANYL CITRATE (PF) 250 MCG/5ML IJ SOLN
INTRAMUSCULAR | Status: AC
  Filled 2020-01-03: qty 5

## 2020-01-03 MED ORDER — TRAMADOL HCL 50 MG PO TABS: 50.0000 mg | ORAL_TABLET | Freq: Four times a day (QID) | ORAL | 0 refills | Status: DC | PRN

## 2020-01-03 MED ORDER — CEFAZOLIN SODIUM-DEXTROSE 2-4 GM/100ML-% IV SOLN
2.0000 g | INTRAVENOUS | Status: AC
  Administered 2020-01-03: 2 g via INTRAVENOUS
  Filled 2020-01-03: qty 100

## 2020-01-03 MED ORDER — PROPOFOL 10 MG/ML IV BOLUS
INTRAVENOUS | Status: AC
  Filled 2020-01-03: qty 20

## 2020-01-03 MED ORDER — MIDAZOLAM HCL 2 MG/2ML IJ SOLN
INTRAMUSCULAR | Status: AC
  Filled 2020-01-03: qty 2

## 2020-01-03 MED ORDER — 0.9 % SODIUM CHLORIDE (POUR BTL) OPTIME
TOPICAL | Status: DC | PRN
  Administered 2020-01-03: 1000 mL

## 2020-01-03 MED ORDER — ONDANSETRON HCL 4 MG/2ML IJ SOLN
INTRAMUSCULAR | Status: DC | PRN
  Administered 2020-01-03: 4 mg via INTRAVENOUS

## 2020-01-03 MED ORDER — ONDANSETRON HCL 4 MG/2ML IJ SOLN
INTRAMUSCULAR | Status: AC
  Filled 2020-01-03: qty 2

## 2020-01-03 MED ORDER — LIDOCAINE-EPINEPHRINE 1 %-1:100000 IJ SOLN
INTRAMUSCULAR | Status: AC
  Filled 2020-01-03: qty 2

## 2020-01-03 MED ORDER — SODIUM CHLORIDE 0.9 % IV SOLN
INTRAVENOUS | Status: AC
  Filled 2020-01-03: qty 1.2

## 2020-01-03 MED ORDER — DEXAMETHASONE SODIUM PHOSPHATE 10 MG/ML IJ SOLN
INTRAMUSCULAR | Status: AC
  Filled 2020-01-03: qty 1

## 2020-01-03 MED ORDER — LIDOCAINE-EPINEPHRINE 1 %-1:100000 IJ SOLN
INTRAMUSCULAR | Status: DC | PRN
  Administered 2020-01-03: 2 mL

## 2020-01-03 MED ORDER — LIDOCAINE 2% (20 MG/ML) 5 ML SYRINGE
INTRAMUSCULAR | Status: DC | PRN
  Administered 2020-01-03: 50 mg via INTRAVENOUS

## 2020-01-03 SURGICAL SUPPLY — 30 items
BNDG ELASTIC 4X5.8 VLCR STR LF (GAUZE/BANDAGES/DRESSINGS) ×3 IMPLANT
BNDG ELASTIC 6X15 VLCR STRL LF (GAUZE/BANDAGES/DRESSINGS) ×3 IMPLANT
CANISTER SUCT 3000ML PPV (MISCELLANEOUS) ×3 IMPLANT
CLIP VESOCCLUDE MED 6/CT (CLIP) ×3 IMPLANT
CLIP VESOCCLUDE SM WIDE 6/CT (CLIP) ×3 IMPLANT
COVER WAND RF STERILE (DRAPES) IMPLANT
DERMABOND ADVANCED (GAUZE/BANDAGES/DRESSINGS) ×2
DERMABOND ADVANCED .7 DNX12 (GAUZE/BANDAGES/DRESSINGS) ×1 IMPLANT
ELECT REM PT RETURN 9FT ADLT (ELECTROSURGICAL) ×3
ELECTRODE REM PT RTRN 9FT ADLT (ELECTROSURGICAL) ×1 IMPLANT
GLOVE BIO SURGEON STRL SZ7.5 (GLOVE) ×3 IMPLANT
GOWN STRL REUS W/ TWL LRG LVL3 (GOWN DISPOSABLE) ×2 IMPLANT
GOWN STRL REUS W/ TWL XL LVL3 (GOWN DISPOSABLE) ×1 IMPLANT
GOWN STRL REUS W/TWL LRG LVL3 (GOWN DISPOSABLE) ×4
GOWN STRL REUS W/TWL XL LVL3 (GOWN DISPOSABLE) ×2
KIT BASIN OR (CUSTOM PROCEDURE TRAY) ×3 IMPLANT
KIT TURNOVER KIT B (KITS) ×3 IMPLANT
NS IRRIG 1000ML POUR BTL (IV SOLUTION) ×3 IMPLANT
PACK CV ACCESS (CUSTOM PROCEDURE TRAY) ×3 IMPLANT
PAD ARMBOARD 7.5X6 YLW CONV (MISCELLANEOUS) ×6 IMPLANT
STAPLER VISISTAT 35W (STAPLE) ×3 IMPLANT
SUT ETHILON 3 0 PS 1 (SUTURE) IMPLANT
SUT MNCRL AB 4-0 PS2 18 (SUTURE) ×3 IMPLANT
SUT PROLENE 6 0 BV (SUTURE) IMPLANT
SUT SILK 0 TIES 10X30 (SUTURE) IMPLANT
SUT VIC AB 3-0 SH 27 (SUTURE) ×2
SUT VIC AB 3-0 SH 27X BRD (SUTURE) ×1 IMPLANT
TOWEL GREEN STERILE (TOWEL DISPOSABLE) ×3 IMPLANT
UNDERPAD 30X30 (UNDERPADS AND DIAPERS) ×3 IMPLANT
WATER STERILE IRR 1000ML POUR (IV SOLUTION) ×3 IMPLANT

## 2020-01-03 NOTE — Anesthesia Preprocedure Evaluation (Addendum)
Anesthesia Evaluation  Patient identified by MRN, date of birth, ID band Patient awake    Reviewed: Allergy & Precautions, NPO status , Patient's Chart, lab work & pertinent test results  Airway Mallampati: II  TM Distance: >3 FB Neck ROM: Full    Dental  (+) Teeth Intact   Pulmonary    breath sounds clear to auscultation       Cardiovascular hypertension,  Rhythm:Regular Rate:Normal     Neuro/Psych    GI/Hepatic   Endo/Other    Renal/GU      Musculoskeletal   Abdominal   Peds  Hematology   Anesthesia Other Findings   Reproductive/Obstetrics                            Anesthesia Physical Anesthesia Plan  ASA: III  Anesthesia Plan: MAC   Post-op Pain Management:    Induction: Intravenous  PONV Risk Score and Plan: Propofol infusion and Ondansetron  Airway Management Planned: Natural Airway and Simple Face Mask  Additional Equipment:   Intra-op Plan:   Post-operative Plan:   Informed Consent: I have reviewed the patients History and Physical, chart, labs and discussed the procedure including the risks, benefits and alternatives for the proposed anesthesia with the patient or authorized representative who has indicated his/her understanding and acceptance.       Plan Discussed with: CRNA and Anesthesiologist  Anesthesia Plan Comments:         Anesthesia Quick Evaluation

## 2020-01-03 NOTE — Interval H&P Note (Signed)
History and Physical Interval Note:  01/03/2020 10:49 AM  Devin Harmon  has presented today for surgery, with the diagnosis of END STAGE RENAL DISEASE.  The various methods of treatment have been discussed with the patient and family. After consideration of risks, benefits and other options for treatment, the patient has consented to  Procedure(s): LIGATION OF ARTERIOVENOUS  FISTULA (Right) as a surgical intervention.  The patient's history has been reviewed, patient examined, no change in status, stable for surgery.  I have reviewed the patient's chart and labs.  Questions were answered to the patient's satisfaction.     Annamarie Major

## 2020-01-03 NOTE — Transfer of Care (Signed)
Immediate Anesthesia Transfer of Care Note  Patient: Devin Harmon  Procedure(s) Performed: LIGATION OF ARTERIOVENOUS  FISTULA (Right Arm Upper)  Patient Location: PACU  Anesthesia Type:MAC  Level of Consciousness: drowsy  Airway & Oxygen Therapy: Patient Spontanous Breathing and Patient connected to nasal cannula oxygen  Post-op Assessment: Report given to RN and Post -op Vital signs reviewed and stable  Post vital signs: Reviewed  Last Vitals:  Vitals Value Taken Time  BP 119/70 01/03/20 1218  Temp    Pulse 58 01/03/20 1221  Resp 20 01/03/20 1221  SpO2 100 % 01/03/20 1221  Vitals shown include unvalidated device data.  Last Pain:  Vitals:   01/03/20 1005  TempSrc:   PainSc: 0-No pain      Patients Stated Pain Goal: 5 (47/65/46 5035)  Complications: No apparent anesthesia complications

## 2020-01-03 NOTE — Op Note (Signed)
    Patient name: Devin Harmon MRN: [id number] DOB: 1956-01-07 Sex: male  01/03/2020 Pre-operative Diagnosis: Right arm swelling Post-operative diagnosis:  Same Surgeon:  Annamarie Major Assistants: Arlee Muslim Procedure:   Ligation of right brachiocephalic fistula Anesthesia: MAC Blood Loss: Minimal Specimens: None   Indications: The patient has significant swelling in his right arm after fistula creation.  He underwent a fistulogram which showed occluded central veins that are unable to be recanalized.  He has a catheter in place in the left side.  Fistula ligation was recommended.  Procedure:  The patient was identified in the holding area and taken to [place] [address]  The patient was then placed supine on the table. MAC anesthesia was administered.  The patient was prepped and draped in the usual sterile fashion.  A time out was called and antibiotics were administered.  1% lidocaine was used for local anesthesia.  I made an incision through the cannulation site for his fistulogram.  Cautery and sharp dissection were used to isolate the fistula which was encircled with a 2-0 silk tie.  I mobilized the fistula down towards the anastomosis.  There were no branches.  I then ligated the fistula using four 2-0 silk ties.  The wound was then irrigated.  I reapproximated the deep tissue with 3-0 Vicryl.  Because of the skin quality, I elected to place several staples to close the incision.  Sterile dressings were applied.  The arm was wrapped in a Ace bandage.   Disposition: To PACU stable.   Theotis Burrow, M.D., [facility] [place] Office: [phone] Pager:  [phone]

## 2020-01-03 NOTE — Anesthesia Procedure Notes (Signed)
Procedure Name: MAC Date/Time: 01/03/2020 11:25 AM Performed by: Janene Harvey, CRNA Pre-anesthesia Checklist: Patient identified, Emergency Drugs available, Suction available and Patient being monitored Patient Re-evaluated:Patient Re-evaluated prior to induction Oxygen Delivery Method: Nasal cannula Dental Injury: Teeth and Oropharynx as per pre-operative assessment

## 2020-01-03 NOTE — Anesthesia Postprocedure Evaluation (Signed)
Anesthesia Post Note  Patient: Devin Harmon  Procedure(s) Performed: LIGATION OF ARTERIOVENOUS  FISTULA (Right Arm Upper)     Patient location during evaluation: PACU Anesthesia Type: MAC Level of consciousness: patient cooperative and confused Pain management: pain level controlled Vital Signs Assessment: post-procedure vital signs reviewed and stable Respiratory status: spontaneous breathing, nonlabored ventilation, respiratory function stable and patient connected to nasal cannula oxygen Cardiovascular status: stable and blood pressure returned to baseline Postop Assessment: no apparent nausea or vomiting Anesthetic complications: no    Last Vitals:  Vitals:   01/03/20 1336 01/03/20 1340  BP: (!) 158/90   Pulse:  (!) 59  Resp: (!) 23 (!) 23  Temp:  (!) 36.1 C  SpO2:  99%    Last Pain:  Vitals:   01/03/20 1340  TempSrc:   PainSc: 0-No pain                 Coren Crownover COKER

## 2020-01-03 NOTE — Discharge Instructions (Signed)
° °  [facility] [place] ° °Discharge Instructions ° °AV Fistula or Graft Surgery for Dialysis Access ° °Please refer to the following instructions for your post-procedure care. Your surgeon or physician assistant will discuss any changes with you. ° °Activity ° °You may drive the day following your surgery, if you are comfortable and no longer taking prescription pain medication. Resume full activity as the soreness in your incision resolves. ° °Bathing/Showering ° °You may shower after you go home. Keep your incision dry for 48 hours. Do not soak in a bathtub, hot tub, or swim until the incision heals completely. You may not shower if you have a hemodialysis catheter. ° °Incision Care ° °Clean your incision with mild soap and water after 48 hours. Pat the area dry with a clean towel. You do not need a bandage unless otherwise instructed. Do not apply any ointments or creams to your incision. You may have skin glue on your incision. Do not peel it off. It will come off on its own in about one week. Your arm may swell a bit after surgery. To reduce swelling use pillows to elevate your arm so it is above your heart. Your doctor will tell you if you need to lightly wrap your arm with an ACE bandage. ° °Diet ° °Resume your normal diet. There are not special food restrictions following this procedure. In order to heal from your surgery, it is CRITICAL to get adequate nutrition. Your body requires vitamins, minerals, and protein. Vegetables are the best source of vitamins and minerals. Vegetables also provide the perfect balance of protein. Processed food has little nutritional value, so try to avoid this. ° °Medications ° °Resume taking all of your medications. If your incision is causing pain, you may take over-the counter pain relievers such as acetaminophen (Tylenol). If you were prescribed a stronger pain medication, please be aware these medications can cause nausea and constipation. Prevent  nausea by taking the medication with a snack or meal. Avoid constipation by drinking plenty of fluids and eating foods with high amount of fiber, such as fruits, vegetables, and grains. Do not take Tylenol if you are taking prescription pain medications. ° ° ° ° °Follow up °Your surgeon may want to see you in the office following your access surgery. If so, this will be arranged at the time of your surgery. ° °Please call us immediately for any of the following conditions: ° °Increased pain, redness, drainage (pus) from your incision site °Fever of 101 degrees or higher °Severe or worsening pain at your incision site °Hand pain or numbness. ° °Reduce your risk of vascular disease: ° °Stop smoking. If you would like help, call [facility] at 1-800-QUIT-NOW (1-800-784-8669) or [place] at 336-586-4000 ° °Manage your cholesterol °Maintain a desired weight °Control your diabetes °Keep your blood pressure down ° °Dialysis ° °It will take several weeks to several months for your new dialysis access to be ready for use. Your surgeon will determine when it is OK to use it. Your nephrologist will continue to direct your dialysis. You can continue to use your Permcath until your new access is ready for use. ° °If you have any questions, please call the office at [phone]. ° °

## 2020-01-03 NOTE — H&P (Signed)
H+P   History of Present Illness Devin Harmon is a 64 y.o. (17-Sep-1956) male who is s/p removal of right IJ tunneled dialysis catheter and placement of 23 cm left IJ tunneled dialysis catheter on 11-15-19 by Dr. Donzetta Matters. Pt had swelling in his RUE.  Pt is also s/p rightbrachiocephalic arteriovenous fistula placementon 09-01-19 by Dr. Carlis Abbott.  This is his first permanent HD access  He returns today for scheduled follow up for evaluation of right arm swelling since the right IJ TDC was removed.  Wife states there has been no decrease of swelling in his right arm.  Wife states that pt has not complained of any problems with his right hand.   Pt denies shortness of breath, denies chest pain, denies fever or chills.   I spoke with his wife by phone, pt is difficult to understand. She states that pt dialyzes MWF via left IJ TDC at [facility] in [place].   Past Medical History:  Diagnosis Date  . Allergy   . Anemia   . BPH (benign prostatic hypertrophy)   . ESRD (end stage renal disease) ([place])    MWF [facility] in [place]  . Hypertension   . Zoster 2007   facial, hospital 2007    Past Surgical History:  Procedure Laterality Date  . A/V FISTULAGRAM N/A 12/19/2019   Procedure: A/V FISTULAGRAM - Right Arm;  Surgeon: Waynetta Sandy, MD;  Location: [place] [facility];  Service: Cardiovascular;  Laterality: N/A;  . AV FISTULA PLACEMENT Right 09/01/2019   Procedure: ARTERIOVENOUS (AV) FISTULA CREATION RIGHT ARM;  Surgeon: Marty Heck, MD;  Location: [place];  Service: Vascular;  Laterality: Right;  . DIALYSIS/PERMA CATHETER INSERTION N/A 04/15/2019   Procedure: DIALYSIS/PERMA CATHETER INSERTION;  Surgeon: Katha Cabal, MD;  Location: [place] [facility];  Service: Cardiovascular;  Laterality: N/A;  . INSERTION OF DIALYSIS CATHETER Left 11/15/2019   Procedure: INSERTION OF LEFT TUNNEL DIALYSIS CATHETER;  Surgeon: Waynetta Sandy, MD;  Location: [place];   Service: Vascular;  Laterality: Left;  . PERIPHERAL VASCULAR INTERVENTION Right 12/19/2019   Procedure: PERIPHERAL VASCULAR INTERVENTION;  Surgeon: Waynetta Sandy, MD;  Location: [place] [facility];  Service: Cardiovascular;  Laterality: Right;  . REMOVAL OF A DIALYSIS CATHETER Right 11/15/2019   Procedure: REMOVAL OF A RIGHT TUNNEL DIALYSIS CATHETER;  Surgeon: Waynetta Sandy, MD;  Location: [place];  Service: Vascular;  Laterality: Right;    Allergies  Allergen Reactions  . Chlorthalidone Other (See Comments)    felt "bad" and ED   . Hydrocodone Nausea Only    dizzy  . Sulfa Antibiotics Hives  . Sulfonamide Derivatives Hives    Prior to Admission medications   Medication Sig Start Date End Date Taking? Authorizing Provider  acetaminophen (TYLENOL) 500 MG tablet Take 500-1,000 mg by mouth every 6 (six) hours as needed for mild pain.   Yes [provider]  amLODipine (NORVASC) 10 MG tablet TAKE 1 TABLET BY MOUTH EVERY DAY Patient taking differently: Take 10 mg by mouth daily.  11/17/19  Yes Venia Carbon, MD  AURYXIA 1 GM 210 MG(Fe) tablet Take 420 mg by mouth 3 (three) times daily with meals.  07/08/19  Yes [provider]  B Complex-C-Folic Acid (RENA-VITE RX) 1 MG TABS Take 1 tablet by mouth daily. 07/15/19  Yes [provider]  cetirizine (ZYRTEC) 10 MG tablet Take 10 mg by mouth every evening.    Yes [provider]  fluticasone (FLONASE) 50  MCG/ACT nasal spray Place 1-2 sprays into both nostrils daily as needed for allergies.    Yes [provider]  hydrocortisone cream 1 % Apply 1 application topically daily as needed (rash).   Yes [provider]  labetalol (NORMODYNE) 100 MG tablet TAKE 1 TABLET BY MOUTH TWICE A DAY Patient taking differently: Take 100 mg by mouth 2 (two) times daily.  11/14/19  Yes Venia Carbon, MD  neomycin-bacitracin-polymyxin (NEOSPORIN) ointment Apply 1 application topically as  needed for wound care.   Yes [provider]  losartan (COZAAR) 100 MG tablet Take 1 tablet (100 mg total) by mouth at bedtime. Patient not taking: Reported on 11/11/2019 04/17/19 11/11/19  Henreitta Leber, MD  traMADol (ULTRAM) 50 MG tablet Take 1 tablet (50 mg total) by mouth every 6 (six) hours as needed. Patient not taking: Reported on 12/15/2019 09/01/19 08/31/20  Dagoberto Ligas, PA-C    Social History   Socioeconomic History  . Marital status: Married    Spouse name: Not on file  . Number of children: 2  . Years of education: Not on file  . Highest education level: Not on file  Occupational History  . Occupation: Museum/gallery curator, building new homes  Tobacco Use  . Smoking status: Never Smoker  . Smokeless tobacco: Never Used  Substance and Sexual Activity  . Alcohol use: Not Currently    Comment: occasional but none since 2019  . Drug use: Not Currently  . Sexual activity: Not on file  Other Topics Concern  . Not on file  Social History Narrative  . Not on file   Social Determinants of Health   Financial Resource Strain:   . Difficulty of Paying Living Expenses: Not on file  Food Insecurity:   . Worried About Charity fundraiser in the Last Year: Not on file  . Ran Out of Food in the Last Year: Not on file  Transportation Needs:   . Lack of Transportation (Medical): Not on file  . Lack of Transportation (Non-Medical): Not on file  Physical Activity:   . Days of Exercise per Week: Not on file  . Minutes of Exercise per Session: Not on file  Stress:   . Feeling of Stress : Not on file  Social Connections:   . Frequency of Communication with Friends and Family: Not on file  . Frequency of Social Gatherings with Friends and Family: Not on file  . Attends Religious Services: Not on file  . Active Member of Clubs or Organizations: Not on file  . Attends Archivist Meetings: Not on file  . Marital Status: Not on file  Intimate Partner Violence:   . Fear  of Current or Ex-Partner: Not on file  . Emotionally Abused: Not on file  . Physically Abused: Not on file  . Sexually Abused: Not on file     Family History  Problem Relation Age of Onset  . Hypertension Mother   . Cancer Mother        lung cancer  . Hypertension Brother   . Heart disease Father   . Coronary artery disease Neg Hx   . Diabetes Neg Hx     ROS: right arm swelling   Physical Examination  Vitals:   01/03/20 0946  BP: (!) 142/81  Pulse: 65  Resp: 19  Temp: 98.5 F (36.9 C)  SpO2: 95%   Body mass index is 26.63 kg/m.  General: The patient appears his stated age 10:  No gross  abnormalities Pulmonary: Respirations are slightly labored at rest, limited air movement in all posterior fields, rales in lower 2/3 of both posterior fields. No rhonchi or wheezes Abdomen: Soft and non-tender with normal bowel sounds. Musculoskeletal: There are no major deformities. Right arm is edematous and larger than left arm, see photo below Neurologic: No focal weakness or paresthesias are detected. Speech is mostly unintelligible. Pt seems to understand me and respond appropriately.  Skin: There are no ulcer or rashes noted. Psychiatric: The patient has a flat affect. Cardiovascular: There is a regular rate and rhythm without significant murmur appreciated.   CBC    Component Value Date/Time   WBC 7.6 04/16/2019 0930   RBC 2.68 (L) 04/16/2019 0930   HGB 12.6 (L) 12/19/2019 0854   HCT 37.0 (L) 12/19/2019 0854   PLT 281 04/16/2019 0930   MCV 85.4 04/16/2019 0930   MCH 26.9 04/16/2019 0930   MCHC 31.4 04/16/2019 0930   RDW 14.7 04/16/2019 0930   LYMPHSABS 0.8 04/12/2019 0926   MONOABS 0.5 04/12/2019 0926   EOSABS 0.2 04/12/2019 0926   BASOSABS 0.1 04/12/2019 0926    BMET    Component Value Date/Time   NA 136 12/19/2019 0854   K 5.3 (H) 12/19/2019 0854   CL 100 12/19/2019 0854   CO2 29 04/16/2019 0930   GLUCOSE 90 12/19/2019 0854   BUN 57 (H) 12/19/2019 0854    CREATININE 9.80 (H) 12/19/2019 0854   CREATININE 2.04 (H) 06/20/2011 1601   CALCIUM 8.0 (L) 04/16/2019 0930   GFRNONAA 6 (L) 04/16/2019 0930   GFRAA 7 (L) 04/16/2019 0930    COAGS: Lab Results  Component Value Date   INR 1.2 03/27/2019     ASSESSMENT/PLAN: [name] has undergone exchange of TC from right to left as well as right upper extremity fistulogram for swelling. 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 He is indicated for ligation of his fistula. We will need to plan left upper extremity fistula possibly will need venography prior.  Maxton Noreen C. Donzetta Matters, MD [facility] Office: [phone] Pager: [phone]

## 2020-01-04 DIAGNOSIS — Z992 Dependence on renal dialysis: Secondary | ICD-10-CM | POA: Insufficient documentation

## 2020-01-24 ENCOUNTER — Ambulatory Visit (HOSPITAL_COMMUNITY)
Admission: RE | Admit: 2020-01-24 | Discharge: 2020-01-24 | Disposition: A | Discharge status: Home / Self Care | Payer: 59 | Source: Ambulatory Visit | Attending: Vascular Surgery | Admitting: Vascular Surgery

## 2020-01-24 ENCOUNTER — Ambulatory Visit (INDEPENDENT_AMBULATORY_CARE_PROVIDER_SITE_OTHER): Payer: Self-pay | Admitting: Physician Assistant

## 2020-01-24 ENCOUNTER — Other Ambulatory Visit: Payer: Self-pay

## 2020-01-24 VITALS — BP 169/96 | HR 70 | Temp 99.5°F | Resp 22 | Ht 61.0 in | Wt 208.0 lb

## 2020-01-24 DIAGNOSIS — Z992 Dependence on renal dialysis: Secondary | ICD-10-CM

## 2020-01-24 DIAGNOSIS — N186 End stage renal disease: Secondary | ICD-10-CM | POA: Diagnosis present

## 2020-01-24 DIAGNOSIS — R600 Localized edema: Secondary | ICD-10-CM

## 2020-01-24 NOTE — Progress Notes (Signed)
POST OPERATIVE OFFICE NOTE    CC:  F/u for surgery  HPI:  [name] who is s/p ligation of right upper extremity brachiocephalic fistula due to right upper extremity edema and fistula gram findings consistent with occluded central veins that were unable to be recanalized.  The interview and exam are performed with the patient's wife via cordless telephone.  The patient denies fever or chills.  He continues to complain of some right upper extremity edema.  No hand pain.  He dialyzes via left IJ tunneled dialysis catheter.  Hemodialysis treatment days: Mondays, Wednesdays and Fridays Hemodialysis treatment center: [facility]  Allergies  Allergen Reactions  . Chlorthalidone Other (See Comments)    felt "bad" and ED   . Hydrocodone Nausea Only    dizzy  . Sulfa Antibiotics Hives  . Sulfonamide Derivatives Hives    Current Outpatient Medications  Medication Sig Dispense Refill  . acetaminophen (TYLENOL) 500 MG tablet Take 500-1,000 mg by mouth every 6 (six) hours as needed for mild pain.    Marland Kitchen amLODipine (NORVASC) 10 MG tablet TAKE 1 TABLET BY MOUTH EVERY DAY (Patient taking differently: Take 10 mg by mouth daily. ) 90 tablet 3  . AURYXIA 1 GM 210 MG(Fe) tablet Take 420 mg by mouth 3 (three) times daily with meals.     . B Complex-C-Folic Acid (RENA-VITE RX) 1 MG TABS Take 1 tablet by mouth daily.    . cetirizine (ZYRTEC) 10 MG tablet Take 10 mg by mouth every evening.     . fluticasone (FLONASE) 50 MCG/ACT nasal spray Place 1-2 sprays into both nostrils daily as needed for allergies.     . hydrocortisone cream 1 % Apply 1 application topically daily as needed (rash).    . labetalol (NORMODYNE) 100 MG tablet TAKE 1 TABLET BY MOUTH TWICE A DAY (Patient taking differently: Take 100 mg by mouth 2 (two) times daily. ) 180 tablet 3  . neomycin-bacitracin-polymyxin (NEOSPORIN) ointment Apply 1 application topically as needed for wound care.    . traMADol (ULTRAM) 50 MG  tablet Take 1 tablet (50 mg total) by mouth every 6 (six) hours as needed. 15 tablet 0  . losartan (COZAAR) 100 MG tablet Take 1 tablet (100 mg total) by mouth at bedtime. (Patient not taking: Reported on 11/11/2019) 30 tablet 1   No current facility-administered medications for this visit.     ROS:  See HPI  Physical Exam:  +-----------------+-------------+----------+---------+  Left Cephalic  Diameter (cm)Depth (cm)Findings   +-----------------+-------------+----------+---------+  Shoulder       0.31               +-----------------+-------------+----------+---------+  Prox upper arm    0.15               +-----------------+-------------+----------+---------+  Mid upper arm    0.22               +-----------------+-------------+----------+---------+  Dist upper arm    0.19               +-----------------+-------------+----------+---------+  Antecubital fossa  0.36               +-----------------+-------------+----------+---------+  Prox forearm     0.15               +-----------------+-------------+----------+---------+  Mid forearm     0.13        branching  +-----------------+-------------+----------+---------+  Dist forearm     0.11  branching  +-----------------+-------------+----------+---------+   +-----------------+-------------+----------+--------+  Left Basilic   Diameter (cm)Depth (cm)Findings  +-----------------+-------------+----------+--------+  Mid upper arm    0.35              +-----------------+-------------+----------+--------+  Dist upper arm    0.44              +-----------------+-------------+----------+--------+  Antecubital fossa  0.29              +-----------------+-------------+----------+--------+   Summary: Right: Not  imaged. Known central vein occlusion.  Left: Patent cephalic and basilic veins.   *See table(s) above for measurements and observations.      Diagnosing physician: Devin Jews MD  Electronically signed by Devin Jews MD on 01/24/2020 at 9:05  Incision: The right antecubital fossa incision is healing nicely without signs of infection or skin edge separation. Extremities: The right upper extremity remains edematous.  No skin breakdown.  5 out of 5 right grip strength.  Bilateral 2+ radial artery pulses. Neuro: Alert and oriented x4.  Chronic dysarthria  Assessment/Plan:  [name] who is s/p: Ligation right upper extremity brachiocephalic fistula due to central venous stenosis.  He has undergone left upper extremity vein mapping with findings of small caliber cephalic vein.  The basilic vein measures 0.3 to .44 cm along its course.  I reviewed the vein mapping with Dr. Carlis Harmon.  We will remove surgical staples from incision today.  Discussed left upper extremity basilic vein fistula versus arteriovenous graft placement with the patient and his wife.  She had multiple questions regarding the likelihood of edema or difficulties with the patient using his left hand post procedure.  We reviewed the risks, including but not limited to, bleeding, infection, edema, steal syndrome and the difficulties predicting success of fistula,.  I did remind her we would reimage the veins at the time of surgery.  We also discussed catheter removal once his new access is established and is being accessed without difficulty.  Devin Mantis, PA-C [facility] [phone]  Clinic MD: Devin Harmon

## 2020-02-07 ENCOUNTER — Encounter: Payer: Self-pay | Admitting: Internal Medicine

## 2020-02-07 ENCOUNTER — Other Ambulatory Visit (HOSPITAL_COMMUNITY)
Admission: RE | Admit: 2020-02-07 | Discharge: 2020-02-07 | Disposition: A | Discharge status: Home / Self Care | Payer: 59 | Source: Ambulatory Visit | Attending: Vascular Surgery | Admitting: Vascular Surgery

## 2020-02-07 ENCOUNTER — Other Ambulatory Visit: Payer: Self-pay

## 2020-02-07 ENCOUNTER — Ambulatory Visit (INDEPENDENT_AMBULATORY_CARE_PROVIDER_SITE_OTHER): Payer: 59 | Admitting: Internal Medicine

## 2020-02-07 ENCOUNTER — Encounter (HOSPITAL_COMMUNITY): Payer: Self-pay | Admitting: Vascular Surgery

## 2020-02-07 DIAGNOSIS — N186 End stage renal disease: Secondary | ICD-10-CM

## 2020-02-07 DIAGNOSIS — Z20822 Contact with and (suspected) exposure to covid-19: Secondary | ICD-10-CM | POA: Diagnosis not present

## 2020-02-07 DIAGNOSIS — I10 Essential (primary) hypertension: Secondary | ICD-10-CM | POA: Diagnosis not present

## 2020-02-07 DIAGNOSIS — Z0000 Encounter for general adult medical examination without abnormal findings: Secondary | ICD-10-CM | POA: Diagnosis not present

## 2020-02-07 DIAGNOSIS — G8191 Hemiplegia, unspecified affecting right dominant side: Secondary | ICD-10-CM | POA: Insufficient documentation

## 2020-02-07 DIAGNOSIS — R4781 Slurred speech: Secondary | ICD-10-CM

## 2020-02-07 DIAGNOSIS — Z01812 Encounter for preprocedural laboratory examination: Secondary | ICD-10-CM | POA: Insufficient documentation

## 2020-02-07 HISTORY — DX: Slurred speech: R47.81

## 2020-02-07 LAB — SARS CORONAVIRUS 2 (TAT 6-24 HRS): SARS Coronavirus 2: NEGATIVE

## 2020-02-07 MED ORDER — FENTANYL CITRATE (PF) 100 MCG/2ML IJ SOLN: 25.0000 ug | INTRAMUSCULAR | Status: DC | PRN

## 2020-02-07 MED ORDER — ONDANSETRON HCL 4 MG/2ML IJ SOLN: 4.0000 mg | Freq: Once | INTRAMUSCULAR | Status: DC | PRN

## 2020-02-07 NOTE — Assessment & Plan Note (Signed)
Doesn't seem to be related to fluid I suspect CVA (though CT scan some months ago didn't show one). I believe he has vascular damage causing the cognitive and speech changes as well Would not use statin with ESRD Don't believe he is an aspirin candidate Will send note to Dr Holley Raring for his review  Fortunately, he is left handed

## 2020-02-07 NOTE — Progress Notes (Signed)
I spoke to Devin Harmon with patient's permission. Devin Harmon has slurred speech and right leg weakness, has not had a documented Stroke. Devin Harmon had covid test and is in quarantine with his wife, patient will travel to dialysis tomorrow wearing a mask. Devin Harmon has a dialysis catheter and will wash up well in am.  Stop band score is 6 wihtout neck measurement, I sent results to Dr. Silvio Pate, patient's PCP.

## 2020-02-07 NOTE — Progress Notes (Signed)
Subjective:    Patient ID: Devin Harmon, male    DOB: 1956-05-14, 64 y.o.   MRN: [id number]  HPI Here with wife for physical This visit occurred during the SARS-CoV-2 public health emergency.  Safety protocols were in place, including screening questions prior to the visit, additional usage of staff PPE, and extensive cleaning of exam room while observing appropriate contact time as indicated for disinfecting solutions.   Reviewed---lots of dialysis graft issues and surgeries Fistula in right arm in October--arm stayed swollen Planning to move the fistula to left side now Still has weakness and numbness in leg and arm since then [facility] okay--but wife has to help him lift it to get in the car  Continues on the dialysis Has trouble staying on the dialysis machine the whole time Notified that he was denied transplant status due to compliance They plan to apply again  Wife is concerned about enlarged prostate She actually means the scrotum Discussed that it is related to fluid Slow stream when he voids  Wife is concerned about his speech as well Some sentences are clear--but can't carry on full conversation Gets distracted and jumbled ("like he speaks another language")  Current Outpatient Medications on File Prior to Visit  Medication Sig Dispense Refill  . acetaminophen (TYLENOL) 500 MG tablet Take 500-1,000 mg by mouth every 6 (six) hours as needed for mild pain.    Marland Kitchen amLODipine (NORVASC) 10 MG tablet TAKE 1 TABLET BY MOUTH EVERY DAY (Patient taking differently: Take 10 mg by mouth daily. ) 90 tablet 3  . AURYXIA 1 GM 210 MG(Fe) tablet Take 420 mg by mouth 3 (three) times daily with meals.     . B Complex-C-Folic Acid (RENA-VITE RX) 1 MG TABS Take 1 mg by mouth daily.    . cetirizine (ZYRTEC) 10 MG tablet Take 10 mg by mouth every evening.     . fluticasone (FLONASE) 50 MCG/ACT nasal spray Place 1-2 sprays into both nostrils daily as needed for allergies.     . hydrocortisone  cream 1 % Apply 1 application topically daily as needed (rash).    . labetalol (NORMODYNE) 100 MG tablet TAKE 1 TABLET BY MOUTH TWICE A DAY (Patient taking differently: Take 100 mg by mouth 2 (two) times daily. ) 180 tablet 3  . neomycin-bacitracin-polymyxin (NEOSPORIN) ointment Apply 1 application topically as needed for wound care.    . traMADol (ULTRAM) 50 MG tablet Take 1 tablet (50 mg total) by mouth every 6 (six) hours as needed. 15 tablet 0   No current facility-administered medications on file prior to visit.    Allergies  Allergen Reactions  . Chlorthalidone Other (See Comments)    felt "bad" and ED   . Hydrocodone Nausea Only    dizzy  . Sulfa Antibiotics Hives  . Sulfonamide Derivatives Hives    Past Medical History:  Diagnosis Date  . Allergy   . Anemia   . BPH (benign prostatic hypertrophy)   . ESRD (end stage renal disease) ([place])    MWF [facility] in [place]  . Hypertension   . Zoster 2007   facial, hospital 2007    Past Surgical History:  Procedure Laterality Date  . A/V FISTULAGRAM N/A 12/19/2019   Procedure: A/V FISTULAGRAM - Right Arm;  Surgeon: Waynetta Sandy, MD;  Location: [place] [facility];  Service: Cardiovascular;  Laterality: N/A;  . AV FISTULA PLACEMENT Right 09/01/2019   Procedure: ARTERIOVENOUS (AV) FISTULA CREATION RIGHT ARM;  Surgeon: Monica Martinez  J, MD;  Location: [place];  Service: Vascular;  Laterality: Right;  . DIALYSIS/PERMA CATHETER INSERTION N/A 04/15/2019   Procedure: DIALYSIS/PERMA CATHETER INSERTION;  Surgeon: Katha Cabal, MD;  Location: [place] [facility];  Service: Cardiovascular;  Laterality: N/A;  . INSERTION OF DIALYSIS CATHETER Left 11/15/2019   Procedure: INSERTION OF LEFT TUNNEL DIALYSIS CATHETER;  Surgeon: Waynetta Sandy, MD;  Location: [place];  Service: Vascular;  Laterality: Left;  . LIGATION OF ARTERIOVENOUS  FISTULA Right 01/03/2020   Procedure: LIGATION OF ARTERIOVENOUS  FISTULA;  Surgeon:  Serafina Mitchell, MD;  Location: [place];  Service: Vascular;  Laterality: Right;  . PERIPHERAL VASCULAR INTERVENTION Right 12/19/2019   Procedure: PERIPHERAL VASCULAR INTERVENTION;  Surgeon: Waynetta Sandy, MD;  Location: [place] [facility];  Service: Cardiovascular;  Laterality: Right;  . REMOVAL OF A DIALYSIS CATHETER Right 11/15/2019   Procedure: REMOVAL OF A RIGHT TUNNEL DIALYSIS CATHETER;  Surgeon: Waynetta Sandy, MD;  Location: [place];  Service: Vascular;  Laterality: Right;    Family History  Problem Relation Age of Onset  . Hypertension Mother   . Cancer Mother        lung cancer  . Hypertension Brother   . Heart disease Father   . Coronary artery disease Neg Hx   . Diabetes Neg Hx     Social History   Socioeconomic History  . Marital status: Married    Spouse name: Not on file  . Number of children: 2  . Years of education: Not on file  . Highest education level: Not on file  Occupational History  . Occupation: Museum/gallery curator, building new homes  Tobacco Use  . Smoking status: Never Smoker  . Smokeless tobacco: Never Used  Substance and Sexual Activity  . Alcohol use: Not Currently    Comment: occasional but none since 2019  . Drug use: Not Currently  . Sexual activity: Not on file  Other Topics Concern  . Not on file  Social History Narrative  . Not on file   Social Determinants of Health   Financial Resource Strain:   . Difficulty of Paying Living Expenses: Not on file  Food Insecurity:   . Worried About Charity fundraiser in the Last Year: Not on file  . Ran Out of Food in the Last Year: Not on file  Transportation Needs:   . Lack of Transportation (Medical): Not on file  . Lack of Transportation (Non-Medical): Not on file  Physical Activity:   . Days of Exercise per Week: Not on file  . Minutes of Exercise per Session: Not on file  Stress:   . Feeling of Stress : Not on file  Social Connections:   . Frequency of Communication with  Friends and Family: Not on file  . Frequency of Social Gatherings with Friends and Family: Not on file  . Attends Religious Services: Not on file  . Active Member of Clubs or Organizations: Not on file  . Attends Archivist Meetings: Not on file  . Marital Status: Not on file  Intimate Partner Violence:   . Fear of Current or Ex-Partner: Not on file  . Emotionally Abused: Not on file  . Physically Abused: Not on file  . Sexually Abused: Not on file   Review of Systems  Constitutional: Positive for fatigue.       Weight is up from swelling/fluid  HENT: Positive for dental problem and hearing loss.   Eyes: Negative for visual  disturbance.  Respiratory: Negative for cough, chest tightness and shortness of breath.   Cardiovascular: Positive for leg swelling. Negative for chest pain and palpitations.  Gastrointestinal: Negative for abdominal pain and constipation.       No heartburn  Endocrine: Negative for polydipsia and polyuria.  Genitourinary: Positive for difficulty urinating. Negative for dysuria.  Musculoskeletal: Negative for arthralgias, back pain and joint swelling.  Skin:       Did have some blisters on right calf. Wife treated with neosporin and they have healed  Allergic/Immunologic: Positive for environmental allergies. Negative for immunocompromised state.  Neurological: Negative for dizziness, syncope, light-headedness and headaches.  Psychiatric/Behavioral: Positive for sleep disturbance. Negative for dysphoric mood. The patient is not nervous/anxious.        Sleeps better in chair       Objective:   Physical Exam  Constitutional: No distress.  HENT:  Poor dentition No oral lesions  Eyes: Pupils are equal, round, and reactive to light. Conjunctivae are normal.  Neck: No thyromegaly present.  Cardiovascular: Normal rate, regular rhythm and normal heart sounds. Exam reveals no gallop.  No murmur heard. Faint pedal pulses  Respiratory: Effort normal and  breath sounds normal. No respiratory distress. He has no wheezes. He has no rales.  GI: Soft. He exhibits distension. There is no abdominal tenderness.  Genitourinary:    Genitourinary Comments: Marked scrotal swelling   Musculoskeletal:     Comments: 3+ edema all the way up legs---tighter on right  Lymphadenopathy:    He has no cervical adenopathy.  Neurological:  Right leg 3/5, left leg 4/5 Right arm 3+/5, left arm 4/5  Skin: No rash noted. No erythema.  Psychiatric:  Passive but responds with brief answers. Follows commands No clear depression           Assessment & Plan:

## 2020-02-07 NOTE — Assessment & Plan Note (Signed)
Frustrating that they are not able to dialyze off his extra fluid Hopes to get back on a transplant list

## 2020-02-07 NOTE — Assessment & Plan Note (Signed)
Reasonable control now BP Readings from Last 3 Encounters:  02/07/20 (!) 148/84  01/24/20 (!) 169/96  01/03/20 (!) 158/90

## 2020-02-07 NOTE — Progress Notes (Signed)
   02/07/20 1724  OBSTRUCTIVE SLEEP APNEA  Have you ever been diagnosed with sleep apnea through a sleep study? No  Do you snore loudly (loud enough to be heard through closed doors)?  1  Do you often feel tired, fatigued, or sleepy during the daytime (such as falling asleep during driving or talking to someone)? 1  Has anyone observed you stop breathing during your sleep? 0  Do you have, or are you being treated for high blood pressure? 1  BMI more than 35 kg/m2? 1  Age > 64 (1-yes) 1  Male Gender (Yes=1) 1  Obstructive Sleep Apnea Score 6

## 2020-02-07 NOTE — Assessment & Plan Note (Signed)
He is going to take COVID vaccine when available Will defer any cancer screening  Consider flu vaccine

## 2020-02-08 NOTE — Anesthesia Preprocedure Evaluation (Addendum)
Anesthesia Evaluation  Patient identified by MRN, date of birth, ID band Patient awake    Reviewed: Allergy & Precautions, H&P , NPO status , Patient's Chart, lab work & pertinent test results  Airway Mallampati: III  TM Distance: >3 FB Neck ROM: Full    Dental no notable dental hx. (+) Teeth Intact, Dental Advisory Given   Pulmonary neg pulmonary ROS,    Pulmonary exam normal breath sounds clear to auscultation       Cardiovascular Exercise Tolerance: Good hypertension, Pt. on medications  Rhythm:Regular Rate:Normal     Neuro/Psych CVA, Residual Symptoms negative psych ROS   GI/Hepatic negative GI ROS, Neg liver ROS,   Endo/Other  negative endocrine ROS  Renal/GU ESRF and DialysisRenal disease  negative genitourinary   Musculoskeletal   Abdominal   Peds  Hematology  (+) Blood dyscrasia, anemia ,   Anesthesia Other Findings   Reproductive/Obstetrics negative OB ROS                           Anesthesia Physical Anesthesia Plan  ASA: III  Anesthesia Plan: General   Post-op Pain Management:    Induction: Intravenous  PONV Risk Score and Plan: 3 and Ondansetron, Dexamethasone and Treatment may vary due to age or medical condition  Airway Management Planned: LMA  Additional Equipment:   Intra-op Plan:   Post-operative Plan: Extubation in OR  Informed Consent: I have reviewed the patients History and Physical, chart, labs and discussed the procedure including the risks, benefits and alternatives for the proposed anesthesia with the patient or authorized representative who has indicated his/her understanding and acceptance.     Dental advisory given  Plan Discussed with: CRNA  Anesthesia Plan Comments:         Anesthesia Quick Evaluation

## 2020-02-09 ENCOUNTER — Other Ambulatory Visit: Payer: Self-pay

## 2020-02-09 ENCOUNTER — Ambulatory Visit (HOSPITAL_COMMUNITY): Payer: 59 | Admitting: Anesthesiology

## 2020-02-09 ENCOUNTER — Encounter (HOSPITAL_COMMUNITY)
Admission: RE | Disposition: A | Discharge status: Home / Self Care | Payer: Self-pay | Source: Home / Self Care | Attending: Vascular Surgery

## 2020-02-09 ENCOUNTER — Ambulatory Visit (HOSPITAL_COMMUNITY)
Admission: RE | Admit: 2020-02-09 | Discharge: 2020-02-09 | Disposition: A | Discharge status: Home / Self Care | Payer: 59 | Attending: Vascular Surgery | Admitting: Vascular Surgery

## 2020-02-09 ENCOUNTER — Encounter (HOSPITAL_COMMUNITY): Payer: Self-pay | Admitting: Vascular Surgery

## 2020-02-09 DIAGNOSIS — D631 Anemia in chronic kidney disease: Secondary | ICD-10-CM | POA: Diagnosis not present

## 2020-02-09 DIAGNOSIS — N186 End stage renal disease: Secondary | ICD-10-CM | POA: Diagnosis present

## 2020-02-09 DIAGNOSIS — Z885 Allergy status to narcotic agent status: Secondary | ICD-10-CM | POA: Insufficient documentation

## 2020-02-09 DIAGNOSIS — N185 Chronic kidney disease, stage 5: Secondary | ICD-10-CM

## 2020-02-09 DIAGNOSIS — Z992 Dependence on renal dialysis: Secondary | ICD-10-CM | POA: Diagnosis not present

## 2020-02-09 DIAGNOSIS — N400 Enlarged prostate without lower urinary tract symptoms: Secondary | ICD-10-CM | POA: Insufficient documentation

## 2020-02-09 DIAGNOSIS — Z801 Family history of malignant neoplasm of trachea, bronchus and lung: Secondary | ICD-10-CM | POA: Diagnosis not present

## 2020-02-09 DIAGNOSIS — I6930 Unspecified sequelae of cerebral infarction: Secondary | ICD-10-CM | POA: Insufficient documentation

## 2020-02-09 DIAGNOSIS — I120 Hypertensive chronic kidney disease with stage 5 chronic kidney disease or end stage renal disease: Secondary | ICD-10-CM | POA: Diagnosis not present

## 2020-02-09 DIAGNOSIS — Z79899 Other long term (current) drug therapy: Secondary | ICD-10-CM | POA: Diagnosis not present

## 2020-02-09 DIAGNOSIS — Z8249 Family history of ischemic heart disease and other diseases of the circulatory system: Secondary | ICD-10-CM | POA: Diagnosis not present

## 2020-02-09 DIAGNOSIS — Z882 Allergy status to sulfonamides status: Secondary | ICD-10-CM | POA: Insufficient documentation

## 2020-02-09 HISTORY — DX: Other symptoms and signs involving the musculoskeletal system: R29.898

## 2020-02-09 HISTORY — PX: AV FISTULA PLACEMENT: SHX1204

## 2020-02-09 LAB — POCT I-STAT, CHEM 8
BUN: 23 mg/dL (ref 8–23)
Calcium, Ion: 0.99 mmol/L — ABNORMAL LOW (ref 1.15–1.40)
Chloride: 96 mmol/L — ABNORMAL LOW (ref 98–111)
Creatinine, Ser: 5.9 mg/dL — ABNORMAL HIGH (ref 0.61–1.24)
Glucose, Bld: 86 mg/dL (ref 70–99)
HCT: 31 % — ABNORMAL LOW (ref 39.0–52.0)
Hemoglobin: 10.5 g/dL — ABNORMAL LOW (ref 13.0–17.0)
Potassium: 3.7 mmol/L (ref 3.5–5.1)
Sodium: 138 mmol/L (ref 135–145)
TCO2: 31 mmol/L (ref 22–32)

## 2020-02-09 SURGERY — ARTERIOVENOUS (AV) FISTULA CREATION
Anesthesia: General | Site: Arm Upper | Laterality: Left

## 2020-02-09 MED ORDER — ONDANSETRON HCL 4 MG/2ML IJ SOLN
INTRAMUSCULAR | Status: AC
  Filled 2020-02-09: qty 2

## 2020-02-09 MED ORDER — DEXAMETHASONE SODIUM PHOSPHATE 10 MG/ML IJ SOLN
INTRAMUSCULAR | Status: AC
  Filled 2020-02-09: qty 1

## 2020-02-09 MED ORDER — 0.9 % SODIUM CHLORIDE (POUR BTL) OPTIME
TOPICAL | Status: DC | PRN
  Administered 2020-02-09: 1000 mL

## 2020-02-09 MED ORDER — DEXAMETHASONE SODIUM PHOSPHATE 10 MG/ML IJ SOLN
INTRAMUSCULAR | Status: DC | PRN
  Administered 2020-02-09: 5 mg via INTRAVENOUS

## 2020-02-09 MED ORDER — CEFAZOLIN SODIUM-DEXTROSE 2-4 GM/100ML-% IV SOLN
INTRAVENOUS | Status: AC
  Filled 2020-02-09: qty 100

## 2020-02-09 MED ORDER — SODIUM CHLORIDE 0.9 % IV SOLN
INTRAVENOUS | Status: DC | PRN
  Administered 2020-02-09: 500 mL

## 2020-02-09 MED ORDER — PAPAVERINE HCL 30 MG/ML IJ SOLN
INTRAMUSCULAR | Status: AC
  Filled 2020-02-09: qty 2

## 2020-02-09 MED ORDER — SUGAMMADEX SODIUM 200 MG/2ML IV SOLN
INTRAVENOUS | Status: DC | PRN
  Administered 2020-02-09: 200 mg via INTRAVENOUS

## 2020-02-09 MED ORDER — OXYCODONE-ACETAMINOPHEN 7.5-325 MG PO TABS: 1.0000 | ORAL_TABLET | ORAL | 0 refills | Status: DC | PRN

## 2020-02-09 MED ORDER — FENTANYL CITRATE (PF) 250 MCG/5ML IJ SOLN
INTRAMUSCULAR | Status: DC | PRN
  Administered 2020-02-09: 25 ug via INTRAVENOUS

## 2020-02-09 MED ORDER — PROPOFOL 10 MG/ML IV BOLUS
INTRAVENOUS | Status: AC
  Filled 2020-02-09: qty 40

## 2020-02-09 MED ORDER — SODIUM CHLORIDE 0.9 % IV SOLN
INTRAVENOUS | Status: AC
  Filled 2020-02-09: qty 1.2

## 2020-02-09 MED ORDER — HEPARIN SODIUM (PORCINE) 1000 UNIT/ML IJ SOLN
INTRAMUSCULAR | Status: AC
  Filled 2020-02-09: qty 1

## 2020-02-09 MED ORDER — CHLORHEXIDINE GLUCONATE 4 % EX LIQD: 60.0000 mL | Freq: Once | CUTANEOUS | Status: DC

## 2020-02-09 MED ORDER — ROCURONIUM BROMIDE 10 MG/ML (PF) SYRINGE
PREFILLED_SYRINGE | INTRAVENOUS | Status: AC
  Filled 2020-02-09: qty 10

## 2020-02-09 MED ORDER — ONDANSETRON HCL 4 MG/2ML IJ SOLN
INTRAMUSCULAR | Status: DC | PRN
  Administered 2020-02-09: 4 mg via INTRAVENOUS

## 2020-02-09 MED ORDER — FENTANYL CITRATE (PF) 250 MCG/5ML IJ SOLN
INTRAMUSCULAR | Status: AC
  Filled 2020-02-09: qty 5

## 2020-02-09 MED ORDER — LIDOCAINE HCL (CARDIAC) PF 100 MG/5ML IV SOSY
PREFILLED_SYRINGE | INTRAVENOUS | Status: DC | PRN
  Administered 2020-02-09: 60 mg via INTRATRACHEAL

## 2020-02-09 MED ORDER — PHENYLEPHRINE HCL-NACL 10-0.9 MG/250ML-% IV SOLN
INTRAVENOUS | Status: DC | PRN
  Administered 2020-02-09: 25 ug/min via INTRAVENOUS

## 2020-02-09 MED ORDER — LIDOCAINE HCL (PF) 1 % IJ SOLN
INTRAMUSCULAR | Status: AC
  Filled 2020-02-09: qty 30

## 2020-02-09 MED ORDER — CEFAZOLIN SODIUM-DEXTROSE 2-4 GM/100ML-% IV SOLN
2.0000 g | INTRAVENOUS | Status: AC
  Administered 2020-02-09: 2 g via INTRAVENOUS

## 2020-02-09 MED ORDER — ALBUMIN HUMAN 5 % IV SOLN: INTRAVENOUS | Status: DC | PRN

## 2020-02-09 MED ORDER — PROPOFOL 10 MG/ML IV BOLUS
INTRAVENOUS | Status: DC | PRN
  Administered 2020-02-09: 170 mg via INTRAVENOUS
  Administered 2020-02-09: 30 mg via INTRAVENOUS

## 2020-02-09 MED ORDER — SODIUM CHLORIDE 0.9 % IV SOLN: INTRAVENOUS | Status: DC

## 2020-02-09 MED ORDER — HEPARIN SODIUM (PORCINE) 1000 UNIT/ML IJ SOLN
INTRAMUSCULAR | Status: DC | PRN
  Administered 2020-02-09: 3000 [IU] via INTRAVENOUS

## 2020-02-09 MED ORDER — MIDAZOLAM HCL 2 MG/2ML IJ SOLN
INTRAMUSCULAR | Status: AC
  Filled 2020-02-09: qty 2

## 2020-02-09 MED ORDER — ACETAMINOPHEN 500 MG PO TABS
1000.0000 mg | ORAL_TABLET | Freq: Once | ORAL | Status: AC
  Administered 2020-02-09: 07:00:00 1000 mg via ORAL
  Filled 2020-02-09: qty 2

## 2020-02-09 MED ORDER — LIDOCAINE HCL 1 % IJ SOLN
INTRAMUSCULAR | Status: DC | PRN
  Administered 2020-02-09: 8 mL

## 2020-02-09 SURGICAL SUPPLY — 40 items
ARMBAND PINK RESTRICT EXTREMIT (MISCELLANEOUS) ×6 IMPLANT
BLADE SAW SGTL 81X20 HD (BLADE) ×3 IMPLANT
CANISTER SUCT 3000ML PPV (MISCELLANEOUS) ×3 IMPLANT
CLIP VESOCCLUDE MED 6/CT (CLIP) ×3 IMPLANT
CLIP VESOCCLUDE SM WIDE 6/CT (CLIP) ×3 IMPLANT
COVER PROBE W GEL 5X96 (DRAPES) ×3 IMPLANT
COVER WAND RF STERILE (DRAPES) ×3 IMPLANT
DECANTER SPIKE VIAL GLASS SM (MISCELLANEOUS) ×3 IMPLANT
DERMABOND ADVANCED (GAUZE/BANDAGES/DRESSINGS) ×2
DERMABOND ADVANCED .7 DNX12 (GAUZE/BANDAGES/DRESSINGS) ×1 IMPLANT
ELECT REM PT RETURN 9FT ADLT (ELECTROSURGICAL) ×3
ELECTRODE REM PT RTRN 9FT ADLT (ELECTROSURGICAL) ×1 IMPLANT
GLOVE BIO SURGEON STRL SZ 6.5 (GLOVE) ×2 IMPLANT
GLOVE BIO SURGEON STRL SZ7.5 (GLOVE) ×3 IMPLANT
GLOVE BIO SURGEONS STRL SZ 6.5 (GLOVE) ×1
GLOVE BIOGEL PI IND STRL 7.0 (GLOVE) ×1 IMPLANT
GLOVE BIOGEL PI IND STRL 7.5 (GLOVE) ×1 IMPLANT
GLOVE BIOGEL PI IND STRL 8 (GLOVE) ×1 IMPLANT
GLOVE BIOGEL PI INDICATOR 7.0 (GLOVE) ×2
GLOVE BIOGEL PI INDICATOR 7.5 (GLOVE) ×2
GLOVE BIOGEL PI INDICATOR 8 (GLOVE) ×2
GOWN STRL REUS W/ TWL LRG LVL3 (GOWN DISPOSABLE) ×2 IMPLANT
GOWN STRL REUS W/ TWL XL LVL3 (GOWN DISPOSABLE) ×2 IMPLANT
GOWN STRL REUS W/TWL LRG LVL3 (GOWN DISPOSABLE) ×6
GOWN STRL REUS W/TWL XL LVL3 (GOWN DISPOSABLE) ×6
HEMOSTAT SPONGE AVITENE ULTRA (HEMOSTASIS) IMPLANT
KIT BASIN OR (CUSTOM PROCEDURE TRAY) ×3 IMPLANT
KIT TURNOVER KIT B (KITS) ×3 IMPLANT
NS IRRIG 1000ML POUR BTL (IV SOLUTION) ×3 IMPLANT
PACK CV ACCESS (CUSTOM PROCEDURE TRAY) ×3 IMPLANT
PAD ARMBOARD 7.5X6 YLW CONV (MISCELLANEOUS) ×6 IMPLANT
SHEET MEDIUM DRAPE 40X70 STRL (DRAPES) ×3 IMPLANT
SUT MNCRL AB 4-0 PS2 18 (SUTURE) ×3 IMPLANT
SUT PROLENE 6 0 BV (SUTURE) ×3 IMPLANT
SUT PROLENE 7 0 BV 1 (SUTURE) IMPLANT
SUT VIC AB 3-0 SH 27 (SUTURE) ×3
SUT VIC AB 3-0 SH 27X BRD (SUTURE) ×1 IMPLANT
TOWEL GREEN STERILE (TOWEL DISPOSABLE) ×3 IMPLANT
UNDERPAD 30X30 (UNDERPADS AND DIAPERS) ×3 IMPLANT
WATER STERILE IRR 1000ML POUR (IV SOLUTION) ×3 IMPLANT

## 2020-02-09 NOTE — Anesthesia Postprocedure Evaluation (Signed)
Anesthesia Post Note  Patient: Devin Harmon  Procedure(s) Performed: LEFT ARM BASILIC VEIN Arteriovenous FISTULA  CREATION (Left Arm Upper)     Patient location during evaluation: PACU Anesthesia Type: General Level of consciousness: awake and alert Pain management: pain level controlled Vital Signs Assessment: post-procedure vital signs reviewed and stable Respiratory status: spontaneous breathing, nonlabored ventilation and respiratory function stable Cardiovascular status: blood pressure returned to baseline and stable Postop Assessment: no apparent nausea or vomiting Anesthetic complications: no    Last Vitals:  Vitals:   02/09/20 1045 02/09/20 1053  BP: 121/72   Pulse: (!) 57 82  Resp: 20 (!) 27  Temp:    SpO2: 94% 95%    Last Pain:  Vitals:   02/09/20 1015  PainSc: Asleep                 Herron Fero,W. EDMOND

## 2020-02-09 NOTE — Progress Notes (Addendum)
Patient presented today with wife [place] for surgery with Dr. Monica Martinez.  Upon Assessment Wife explained that the patient has slurred speech and that she usually helps with his medical care.  Patient does have slurred speech and its almost difficult to understand.  Patient is Oriented X4.  Patients Wife [place] stated that this is not something that is new for him.  Wife also stated that he has weakness on his right side (arm and leg).  Per [place] that is also not new for him.  Spoke with Dr. Therisa Doyne to make him aware

## 2020-02-09 NOTE — Discharge Instructions (Signed)
° °  [facility] ° °Discharge Instructions ° °AV Fistula or Graft Surgery for Dialysis Access ° °Please refer to the following instructions for your post-procedure care. Your surgeon or physician assistant will discuss any changes with you. ° °Activity ° °You may drive the day following your surgery, if you are comfortable and no longer taking prescription pain medication. Resume full activity as the soreness in your incision resolves. ° °Bathing/Showering ° °You may shower after you go home. Keep your incision dry for 48 hours. Do not soak in a bathtub, hot tub, or swim until the incision heals completely. You may not shower if you have a hemodialysis catheter. ° °Incision Care ° °Clean your incision with mild soap and water after 48 hours. Pat the area dry with a clean towel. You do not need a bandage unless otherwise instructed. Do not apply any ointments or creams to your incision. You may have skin glue on your incision. Do not peel it off. It will come off on its own in about one week. Your arm may swell a bit after surgery. To reduce swelling use pillows to elevate your arm so it is above your heart. Your doctor will tell you if you need to lightly wrap your arm with an ACE bandage. ° °Diet ° °Resume your normal diet. There are not special food restrictions following this procedure. In order to heal from your surgery, it is CRITICAL to get adequate nutrition. Your body requires vitamins, minerals, and protein. Vegetables are the best source of vitamins and minerals. Vegetables also provide the perfect balance of protein. Processed food has little nutritional value, so try to avoid this. ° °Medications ° °Resume taking all of your medications. If your incision is causing pain, you may take over-the counter pain relievers such as acetaminophen (Tylenol). If you were prescribed a stronger pain medication, please be aware these medications can cause nausea and constipation. Prevent  nausea by taking the medication with a snack or meal. Avoid constipation by drinking plenty of fluids and eating foods with high amount of fiber, such as fruits, vegetables, and grains. Do not take Tylenol if you are taking prescription pain medications. ° ° ° ° °Follow up °Your surgeon may want to see you in the office following your access surgery. If so, this will be arranged at the time of your surgery. ° °Please call us immediately for any of the following conditions: ° °Increased pain, redness, drainage (pus) from your incision site °Fever of 101 degrees or higher °Severe or worsening pain at your incision site °Hand pain or numbness. ° °Reduce your risk of vascular disease: ° °Stop smoking. If you would like help, call [facility] at 1-800-QUIT-NOW (1-800-784-8669) or [place] at 336-586-4000 ° °Manage your cholesterol °Maintain a desired weight °Control your diabetes °Keep your blood pressure down ° °Dialysis ° °It will take several weeks to several months for your new dialysis access to be ready for use. Your surgeon will determine when it is OK to use it. Your nephrologist will continue to direct your dialysis. You can continue to use your Permcath until your new access is ready for use. ° °If you have any questions, please call the office at [phone]. ° °

## 2020-02-09 NOTE — Transfer of Care (Signed)
Immediate Anesthesia Transfer of Care Note  Patient: Devin Harmon  Procedure(s) Performed: LEFT ARM BASILIC VEIN Arteriovenous FISTULA  CREATION (Left Arm Upper)  Patient Location: PACU  Anesthesia Type:General  Level of Consciousness: drowsy and patient cooperative  Airway & Oxygen Therapy: Patient Spontanous Breathing and Patient connected to face mask oxygen  Post-op Assessment: Report given to RN and Post -op Vital signs reviewed and stable  Post vital signs: Reviewed and stable  Last Vitals:  Vitals Value Taken Time  BP 114/82 02/09/20 0915  Temp    Pulse 57 02/09/20 0916  Resp 22 02/09/20 0916  SpO2 100 % 02/09/20 0916  Vitals shown include unvalidated device data.  Last Pain:  Vitals:   02/09/20 0641  PainSc: Asleep         Complications: No apparent anesthesia complications

## 2020-02-09 NOTE — Anesthesia Procedure Notes (Signed)
Procedure Name: Intubation Date/Time: 02/09/2020 8:31 AM Performed by: Kathryne Hitch, CRNA Pre-anesthesia Checklist: Patient identified, Emergency Drugs available, Suction available and Patient being monitored Patient Re-evaluated:Patient Re-evaluated prior to induction Oxygen Delivery Method: Circle system utilized Preoxygenation: Pre-oxygenation with 100% oxygen Induction Type: IV induction Ventilation: Mask ventilation without difficulty Laryngoscope Size: Miller and 3 Tube type: Oral Tube size: 7.5 mm Number of attempts: 2 Airway Equipment and Method: Stylet and Oral airway Placement Confirmation: ETT inserted through vocal cords under direct vision,  positive ETCO2 and breath sounds checked- equal and bilateral Secured at: 23 cm Tube secured with: Tape Dental Injury: Teeth and Oropharynx as per pre-operative assessment  Comments: Ola Spurr, MD intubated patient.

## 2020-02-09 NOTE — Op Note (Signed)
OPERATIVE NOTE   PROCEDURE: 1. left first stage basilic vein transposition (brachiobasilic arteriovenous fistula) placement  PRE-OPERATIVE DIAGNOSIS: ESRD  POST-OPERATIVE DIAGNOSIS: same  SURGEON: Marty Heck, MD  ASSISTANT(S): Paulo Fruit  ANESTHESIA: general  ESTIMATED BLOOD LOSS: Minimal  FINDING(S): 1.  Basilic vein: 3 mm antecubital fossa, narrowed segment mid upper arm borderline that required passing manual dilators with valve versus sclerotic segment 2.  Brachial artery: 3.5 mm, atherosclerotic disease evident 3.  Venous outflow: palpable thrill  4.  Radial flow: palpable radial pulse  SPECIMEN(S):  none  INDICATIONS:   Devin Harmon is a 64 y.o. male who presents with ESRD and need for permanent hemodialysis access.  The patient is scheduled for left first stage basilic vein transposition.  The patient is aware the risks include but are not limited to: bleeding, infection, steal syndrome, nerve damage, ischemic monomelic neuropathy, failure to mature, and need for additional procedures.  The patient is aware of the risks of the procedure and elects to proceed forward.   DESCRIPTION: After full informed written consent was obtained from the patient, the patient was brought back to the operating room and placed supine upon the operating table.  Prior to induction, the patient received IV antibiotics.   After obtaining adequate anesthesia, the patient was then prepped and draped in the standard fashion for a left arm access procedure.  I turned my attention first to identifying the patient's basilic vein and brachial artery.    Using SonoSite guidance, the location of these vessels were marked out on the skin.   At this point, I injected local anesthetic to obtain a field block of the antecubitum.  In total, I injected about 8 mL of 1% lidocaine without epinephrine.  I made a transverse incision at the level of the antecubitum and dissected through the  subcutaneous tissue and fascia to gain exposure of the brachial artery.  This was noted to be 3.5 mm in diameter externally.  This was dissected out proximally and distally and controlled with vessel loops .  I then dissected out the basilic vein.  This was noted to be 3 mm in diameter externally at the antecubital space.  The distal segment of the vein was ligated with a  2-0 silk, and the vein was transected.  The proximal segment was interrogated with serial dilators.  I initially had trouble passing a dilator proximally in the mid upper arm and on further evaluation with ultrasound appeared to be valvue versus sclerotic segment.  I finally got a 1.5 mm, then 2.0 mm, then 2.5 mm to pass proximally.  I then instilled the heparinized saline into the vein and clamped it.  The patient was given 3000 units IV heparin.  At this point, I reset my exposure of the brachial artery and placed the artery under tension proximally and distally.  I made an arteriotomy with a #11 blade, and then I extended the arteriotomy with a Potts scissor.  I injected heparinized saline proximal and distal to this arteriotomy.  The vein was then sewn to the artery in an end-to-side configuration with a running stitch of 6-0 Prolene.  Prior to completing this anastomosis, I allowed the vein and artery to backbleed.  There was no evidence of clot from any vessels.  I completed the anastomosis in the usual fashion and then released all vessel loops and clamps.    There was a palpable thrill in the venous outflow, and there was a palpable radial pulse.  At this point, I irrigated out the surgical wound.  There was no further active bleeding.  The subcutaneous tissue was reapproximated with a running stitch of 3-0 Vicryl.  The skin was then reapproximated with a running subcuticular stitch of 4-0 Monocryl.  The skin was then cleaned, dried, and reinforced with Dermabond.  The patient tolerated this procedure well.   COMPLICATIONS:  None  CONDITION: Stable  Marty Heck MD [facility] [place] Office: [phone]  02/09/2020, 8:45 AM

## 2020-02-09 NOTE — H&P (Signed)
History and Physical Interval Note:  02/09/2020 7:10 AM  Lorna Dibble  has presented today for surgery, with the diagnosis of END STAGE RENAL DISEASE.  The various methods of treatment have been discussed with the patient and family. After consideration of risks, benefits and other options for treatment, the patient has consented to  Procedure(s): LEFT ARM BASILIC VEIN FISTULA CREATION VERSES ARTERIOVENOUS GRAFT (Left) as a surgical intervention.  The patient's history has been reviewed, patient examined, no change in status, stable for surgery.  I have reviewed the patient's chart and labs.  Questions were answered to the patient's satisfaction.    Plan left arm AVF after recent ligation of right brachiocephalic AVF for central venous occlusion.  Likely basilic, discussed likely two stages.    Marty Heck  CC:  F/u for surgery  HPI:  [name] who is s/p ligation of right upper extremity brachiocephalic fistula due to right upper extremity edema and fistula gram findings consistent with occluded central veins that were unable to be recanalized.  The interview and exam are performed with the patient's wife via cordless telephone.  The patient denies fever or chills.  He continues to complain of some right upper extremity edema.  No hand pain.  He dialyzes via left IJ tunneled dialysis catheter.  Hemodialysis treatment days: Mondays, Wednesdays and Fridays Hemodialysis treatment center: [facility]       Allergies  Allergen Reactions  . Chlorthalidone Other (See Comments)    felt "bad" and ED   . Hydrocodone Nausea Only    dizzy  . Sulfa Antibiotics Hives  . Sulfonamide Derivatives Hives          Current Outpatient Medications  Medication Sig Dispense Refill  . acetaminophen (TYLENOL) 500 MG tablet Take 500-1,000 mg by mouth every 6 (six) hours as needed for mild pain.    Marland Kitchen amLODipine (NORVASC) 10 MG tablet TAKE 1 TABLET BY MOUTH EVERY DAY  (Patient taking differently: Take 10 mg by mouth daily. ) 90 tablet 3  . AURYXIA 1 GM 210 MG(Fe) tablet Take 420 mg by mouth 3 (three) times daily with meals.     . B Complex-C-Folic Acid (RENA-VITE RX) 1 MG TABS Take 1 tablet by mouth daily.    . cetirizine (ZYRTEC) 10 MG tablet Take 10 mg by mouth every evening.     . fluticasone (FLONASE) 50 MCG/ACT nasal spray Place 1-2 sprays into both nostrils daily as needed for allergies.     . hydrocortisone cream 1 % Apply 1 application topically daily as needed (rash).    . labetalol (NORMODYNE) 100 MG tablet TAKE 1 TABLET BY MOUTH TWICE A DAY (Patient taking differently: Take 100 mg by mouth 2 (two) times daily. ) 180 tablet 3  . neomycin-bacitracin-polymyxin (NEOSPORIN) ointment Apply 1 application topically as needed for wound care.    . traMADol (ULTRAM) 50 MG tablet Take 1 tablet (50 mg total) by mouth every 6 (six) hours as needed. 15 tablet 0  . losartan (COZAAR) 100 MG tablet Take 1 tablet (100 mg total) by mouth at bedtime. (Patient not taking: Reported on 11/11/2019) 30 tablet 1   No current facility-administered medications for this visit.     ROS:  See HPI  Physical Exam:  +-----------------+-------------+----------+---------+  Left Cephalic  Diameter (cm)Depth (cm)Findings   +-----------------+-------------+----------+---------+  Shoulder       0.31               +-----------------+-------------+----------+---------+  Prox upper arm    0.15               +-----------------+-------------+----------+---------+  Mid upper arm    0.22               +-----------------+-------------+----------+---------+  Dist upper arm    0.19               +-----------------+-------------+----------+---------+  Antecubital fossa  0.36               +-----------------+-------------+----------+---------+  Prox forearm      0.15               +-----------------+-------------+----------+---------+  Mid forearm     0.13        branching  +-----------------+-------------+----------+---------+  Dist forearm     0.11        branching  +-----------------+-------------+----------+---------+   +-----------------+-------------+----------+--------+  Left Basilic   Diameter (cm)Depth (cm)Findings  +-----------------+-------------+----------+--------+  Mid upper arm    0.35              +-----------------+-------------+----------+--------+  Dist upper arm    0.44              +-----------------+-------------+----------+--------+  Antecubital fossa  0.29              +-----------------+-------------+----------+--------+   Summary: Right: Not imaged. Known central vein occlusion.  Left: Patent cephalic and basilic veins.   *See table(s) above for measurements and observations.      Diagnosing physician: Curt Jews MD  Electronically signed by Curt Jews MD on 01/24/2020 at 9:05  Incision: The right antecubital fossa incision is healing nicely without signs of infection or skin edge separation. Extremities: The right upper extremity remains edematous.  No skin breakdown.  5 out of 5 right grip strength.  Bilateral 2+ radial artery pulses. Neuro: Alert and oriented x4.  Chronic dysarthria  Assessment/Plan:  [name] who is s/p: Ligation right upper extremity brachiocephalic fistula due to central venous stenosis.  He has undergone left upper extremity vein mapping with findings of small caliber cephalic vein.  The basilic vein measures 0.3 to .44 cm along its course.  I reviewed the vein mapping with Dr. Carlis Abbott.  We will remove surgical staples from incision today.  Discussed left upper extremity basilic vein fistula versus arteriovenous graft placement with the patient and his wife.  She had  multiple questions regarding the likelihood of edema or difficulties with the patient using his left hand post procedure.  We reviewed the risks, including but not limited to, bleeding, infection, edema, steal syndrome and the difficulties predicting success of fistula,.  I did remind her we would reimage the veins at the time of surgery.  We also discussed catheter removal once his new access is established and is being accessed without difficulty.  Jannet Mantis, PA-C [facility] [phone]  Clinic MD: Carlis Abbott

## 2020-02-09 NOTE — Anesthesia Procedure Notes (Signed)
Procedure Name: LMA Insertion Date/Time: 02/09/2020 7:53 AM Performed by: Kathryne Hitch, CRNA Pre-anesthesia Checklist: Patient identified, Emergency Drugs available, Suction available, Patient being monitored and Timeout performed Patient Re-evaluated:Patient Re-evaluated prior to induction Oxygen Delivery Method: Circle system utilized Preoxygenation: Pre-oxygenation with 100% oxygen Induction Type: IV induction Ventilation: Mask ventilation without difficulty LMA: LMA inserted LMA Size: 5.0 Tube secured with: Tape Dental Injury: Teeth and Oropharynx as per pre-operative assessment

## 2020-02-21 ENCOUNTER — Ambulatory Visit (INDEPENDENT_AMBULATORY_CARE_PROVIDER_SITE_OTHER): Payer: Self-pay | Admitting: Vascular Surgery

## 2020-02-21 ENCOUNTER — Encounter: Payer: Self-pay | Admitting: Vascular Surgery

## 2020-02-21 ENCOUNTER — Other Ambulatory Visit: Payer: Self-pay

## 2020-02-21 VITALS — BP 145/79 | HR 60 | Temp 97.9°F | Resp 20 | Ht 67.0 in | Wt 210.0 lb

## 2020-02-21 DIAGNOSIS — N186 End stage renal disease: Secondary | ICD-10-CM

## 2020-02-21 NOTE — Progress Notes (Signed)
Patient name: Devin Harmon MRN: [id number] DOB: 09/28/1956 Sex: male  REASON FOR VISIT: Triage visit for left arm swelling after left first stage basilic vein transposition  HPI: Devin Harmon is a 64 y.o. male with end-stage renal disease that presents for evaluation of left arm swelling after first stage basilic vein transposition on 02/09/2020.  Patient also notes some serous drainage from the left antecubital incision.  No fevers or chills.  His arm swelling and drainage from incision was noted at dialysis and they contacted our office.  He is well-known to our service and previously had a right brachiocephalic fistula with profound arm swelling and ultimately required ligation of this due to a central venous occlusion on the right that was discovered after surgery.  Has a [facility] currently using for dialysis.  Denies any numbness or tingling in left hand.  Past Medical History:  Diagnosis Date  . Allergy   . Anemia   . BPH (benign prostatic hypertrophy)   . ESRD (end stage renal disease) ([place])    MWF - [place]  . Hypertension   . Leg weakness    right  . Slurred speech   . Z[place] 2007   facial, hospital 2007    Past Surgical History:  Procedure Laterality Date  . A/V FISTULAGRAM N/A 12/19/2019   Procedure: A/V FISTULAGRAM - Right Arm;  Surgeon: Waynetta Sandy, MD;  Location: [place] [facility];  Service: Cardiovascular;  Laterality: N/A;  . AV FISTULA PLACEMENT Right 09/01/2019   Procedure: ARTERIOVENOUS (AV) FISTULA CREATION RIGHT ARM;  Surgeon: Marty Heck, MD;  Location: [facility];  Service: Vascular;  Laterality: Right;  . AV FISTULA PLACEMENT Left 02/09/2020   Procedure: LEFT ARM BASILIC VEIN Arteriovenous FISTULA  CREATION;  Surgeon: Marty Heck, MD;  Location: [place];  Service: Vascular;  Laterality: Left;  . DIALYSIS/PERMA CATHETER INSERTION N/A 04/15/2019   Procedure: DIALYSIS/PERMA CATHETER INSERTION;  Surgeon: Katha Cabal, MD;  Location: [place] [facility];  Service: Cardiovascular;  Laterality: N/A;  . INSERTION OF DIALYSIS CATHETER Left 11/15/2019   Procedure: INSERTION OF LEFT TUNNEL DIALYSIS CATHETER;  Surgeon: Waynetta Sandy, MD;  Location: [place];  Service: Vascular;  Laterality: Left;  . LIGATION OF ARTERIOVENOUS  FISTULA Right 01/03/2020   Procedure: LIGATION OF ARTERIOVENOUS  FISTULA;  Surgeon: Serafina Mitchell, MD;  Location: [place];  Service: Vascular;  Laterality: Right;  . PERIPHERAL VASCULAR INTERVENTION Right 12/19/2019   Procedure: PERIPHERAL VASCULAR INTERVENTION;  Surgeon: Waynetta Sandy, MD;  Location: [place] [facility];  Service: Cardiovascular;  Laterality: Right;  . REMOVAL OF A DIALYSIS CATHETER Right 11/15/2019   Procedure: REMOVAL OF A RIGHT TUNNEL DIALYSIS CATHETER;  Surgeon: Waynetta Sandy, MD;  Location: [place];  Service: Vascular;  Laterality: Right;    Family History  Problem Relation Age of Onset  . Hypertension Mother   . Cancer Mother        lung cancer  . Hypertension Brother   . Heart disease Father   . Coronary artery disease Neg Hx   . Diabetes Neg Hx     SOCIAL HISTORY: Social History   Tobacco Use  . Smoking status: Never Smoker  . Smokeless tobacco: Never Used  Substance Use Topics  . Alcohol use: Not Currently    Comment: occasional but none since 2019    Allergies  Allergen Reactions  . Sulfa Antibiotics Hives  . Sulfonamide Derivatives Hives  . Chlorthalidone Other (See  Comments)    felt "bad" and ED   . Hydrocodone Nausea Only    dizzy    Current Outpatient Medications  Medication Sig Dispense Refill  . acetaminophen (TYLENOL) 500 MG tablet Take 500-1,000 mg by mouth every 6 (six) hours as needed for mild pain.    Marland Kitchen amLODipine (NORVASC) 10 MG tablet TAKE 1 TABLET BY MOUTH EVERY DAY (Patient taking differently: Take 10 mg by mouth daily. ) 90 tablet 3  . AURYXIA 1 GM 210 MG(Fe) tablet Take 420 mg by mouth 3 (three) times daily with  meals.     . B Complex-C-Folic Acid (RENA-VITE RX) 1 MG TABS Take 1 mg by mouth daily.    . cetirizine (ZYRTEC) 10 MG tablet Take 10 mg by mouth every evening.     . fluticasone (FLONASE) 50 MCG/ACT nasal spray Place 1-2 sprays into both nostrils daily as needed for allergies.     . hydrocortisone cream 1 % Apply 1 application topically daily as needed (rash).    . labetalol (NORMODYNE) 100 MG tablet TAKE 1 TABLET BY MOUTH TWICE A DAY (Patient taking differently: Take 100 mg by mouth 2 (two) times daily. ) 180 tablet 3  . neomycin-bacitracin-polymyxin (NEOSPORIN) ointment Apply 1 application topically as needed for wound care.    . traMADol (ULTRAM) 50 MG tablet Take 1 tablet (50 mg total) by mouth every 6 (six) hours as needed. 15 tablet 0  . oxyCODONE-acetaminophen (PERCOCET) 7.5-325 MG tablet Take 1 tablet by mouth every 4 (four) hours as needed for severe pain. (Patient not taking: Reported on 02/21/2020) 6 tablet 0   No current facility-administered medications for this visit.    REVIEW OF SYSTEMS:  [X]  denotes positive finding, [ ]  denotes negative finding Cardiac  Comments:  Chest pain or chest pressure:    Shortness of breath upon exertion:    Short of breath when lying flat:    Irregular heart rhythm:        Vascular    Pain in calf, thigh, or hip brought on by ambulation:    Pain in feet at night that wakes you up from your sleep:     Blood clot in your veins:    Leg swelling:         Pulmonary    Oxygen at home:    Productive cough:     Wheezing:         Neurologic    Sudden weakness in arms or legs:     Sudden numbness in arms or legs:     Sudden onset of difficulty speaking or slurred speech:    Temporary loss of vision in one eye:     Problems with dizziness:         Gastrointestinal    Blood in stool:     Vomited blood:         Genitourinary    Burning when urinating:     Blood in urine:        Psychiatric    Major depression:         Hematologic      Bleeding problems:    Problems with blood clotting too easily:        Skin    Rashes or ulcers:        Constitutional    Fever or chills:      PHYSICAL EXAM: Vitals:   02/21/20 1359  BP: (!) 145/79  Pulse: 60  Resp: 20  Temp: 97.9 F (  36.6 C)  TempSrc: Temporal  SpO2: 98%  Weight: 210 lb (95.3 kg)  Height: 5\' 7"  (1.702 m)    GENERAL: The patient is a well-nourished male, in no acute distress. The vital signs are documented above. CARDIAC: There is a regular rate and rhythm.  VASCULAR:  Left brachiobasilic fistula with good thrill Palpable left radial pulse Left arm swelling 2+ Serous drainage from left arm incision  DATA:   None  Assessment/Plan:  64 year old male presents with left arm swelling and serous drainage from his left arm incision after first stage basilic vein transposition on 02/09/20.  Discussed with him and his wife that there is no active signs of infection at this time.  I do not think he has steal either, palpable radial pulse and no motor or sensory symptoms in left hand.  I have recommended that they do dry dressing changes several times daily to the drainage from antecubital incision and wrap his arm from the hand up to the shoulder with an Ace wrap for gentle compression to help with the swelling.  Will schedule him for left arm fistulogram to evaluate for stenosis versus occlusion on the left similar to what he had on the right.  I would like to get him several weeks out from surgery before proceeding with left arm fistulogram.  His wife will call if symptoms worsen.  Doesn't feel the left arm swelling is as severe as the right arm.    Marty Heck, MD [facility] Office: [phone]

## 2020-02-22 ENCOUNTER — Other Ambulatory Visit: Payer: Self-pay

## 2020-03-13 ENCOUNTER — Other Ambulatory Visit (HOSPITAL_COMMUNITY)
Admission: RE | Admit: 2020-03-13 | Discharge: 2020-03-13 | Disposition: A | Discharge status: Home / Self Care | Payer: 59 | Source: Ambulatory Visit | Attending: Vascular Surgery | Admitting: Vascular Surgery

## 2020-03-13 ENCOUNTER — Encounter (HOSPITAL_COMMUNITY): Payer: 59

## 2020-03-13 DIAGNOSIS — Z01812 Encounter for preprocedural laboratory examination: Secondary | ICD-10-CM | POA: Diagnosis not present

## 2020-03-13 DIAGNOSIS — Z20822 Contact with and (suspected) exposure to covid-19: Secondary | ICD-10-CM | POA: Insufficient documentation

## 2020-03-13 LAB — SARS CORONAVIRUS 2 (TAT 6-24 HRS): SARS Coronavirus 2: NEGATIVE

## 2020-03-15 ENCOUNTER — Ambulatory Visit (HOSPITAL_COMMUNITY)
Admission: RE | Admit: 2020-03-15 | Discharge: 2020-03-15 | Disposition: A | Discharge status: Home / Self Care | Payer: 59 | Attending: Vascular Surgery | Admitting: Vascular Surgery

## 2020-03-15 ENCOUNTER — Other Ambulatory Visit: Payer: Self-pay

## 2020-03-15 ENCOUNTER — Encounter (HOSPITAL_COMMUNITY)
Admission: RE | Disposition: A | Discharge status: Home / Self Care | Payer: Self-pay | Source: Home / Self Care | Attending: Vascular Surgery

## 2020-03-15 DIAGNOSIS — Z882 Allergy status to sulfonamides status: Secondary | ICD-10-CM | POA: Diagnosis not present

## 2020-03-15 DIAGNOSIS — Z79899 Other long term (current) drug therapy: Secondary | ICD-10-CM | POA: Insufficient documentation

## 2020-03-15 DIAGNOSIS — N186 End stage renal disease: Secondary | ICD-10-CM | POA: Insufficient documentation

## 2020-03-15 DIAGNOSIS — Z885 Allergy status to narcotic agent status: Secondary | ICD-10-CM | POA: Diagnosis not present

## 2020-03-15 DIAGNOSIS — N400 Enlarged prostate without lower urinary tract symptoms: Secondary | ICD-10-CM | POA: Insufficient documentation

## 2020-03-15 DIAGNOSIS — Y841 Kidney dialysis as the cause of abnormal reaction of the patient, or of later complication, without mention of misadventure at the time of the procedure: Secondary | ICD-10-CM | POA: Insufficient documentation

## 2020-03-15 DIAGNOSIS — T82858A Stenosis of vascular prosthetic devices, implants and grafts, initial encounter: Secondary | ICD-10-CM | POA: Diagnosis present

## 2020-03-15 DIAGNOSIS — I120 Hypertensive chronic kidney disease with stage 5 chronic kidney disease or end stage renal disease: Secondary | ICD-10-CM | POA: Diagnosis not present

## 2020-03-15 DIAGNOSIS — Z992 Dependence on renal dialysis: Secondary | ICD-10-CM

## 2020-03-15 DIAGNOSIS — T82898A Other specified complication of vascular prosthetic devices, implants and grafts, initial encounter: Secondary | ICD-10-CM

## 2020-03-15 HISTORY — PX: PERIPHERAL VASCULAR BALLOON ANGIOPLASTY: CATH118281

## 2020-03-15 HISTORY — PX: A/V FISTULAGRAM: CATH118298

## 2020-03-15 LAB — POCT I-STAT, CHEM 8
BUN: 65 mg/dL — ABNORMAL HIGH (ref 8–23)
Calcium, Ion: 1.01 mmol/L — ABNORMAL LOW (ref 1.15–1.40)
Chloride: 97 mmol/L — ABNORMAL LOW (ref 98–111)
Creatinine, Ser: 8.4 mg/dL — ABNORMAL HIGH (ref 0.61–1.24)
Glucose, Bld: 84 mg/dL (ref 70–99)
HCT: 33 % — ABNORMAL LOW (ref 39.0–52.0)
Hemoglobin: 11.2 g/dL — ABNORMAL LOW (ref 13.0–17.0)
Potassium: 5.3 mmol/L — ABNORMAL HIGH (ref 3.5–5.1)
Sodium: 137 mmol/L (ref 135–145)
TCO2: 32 mmol/L (ref 22–32)

## 2020-03-15 SURGERY — A/V FISTULAGRAM
Anesthesia: LOCAL | Laterality: Left

## 2020-03-15 MED ORDER — LIDOCAINE HCL (PF) 1 % IJ SOLN
INTRAMUSCULAR | Status: AC
  Filled 2020-03-15: qty 30

## 2020-03-15 MED ORDER — SODIUM CHLORIDE 0.9% FLUSH: 3.0000 mL | Freq: Two times a day (BID) | INTRAVENOUS | Status: DC

## 2020-03-15 MED ORDER — LIDOCAINE HCL (PF) 1 % IJ SOLN
INTRAMUSCULAR | Status: DC | PRN
  Administered 2020-03-15: 2 mL

## 2020-03-15 MED ORDER — HEPARIN SODIUM (PORCINE) 1000 UNIT/ML IJ SOLN
INTRAMUSCULAR | Status: DC | PRN
  Administered 2020-03-15: 3000 [IU] via INTRAVENOUS

## 2020-03-15 MED ORDER — HEPARIN (PORCINE) IN NACL 1000-0.9 UT/500ML-% IV SOLN
INTRAVENOUS | Status: AC
  Filled 2020-03-15: qty 500

## 2020-03-15 MED ORDER — SODIUM CHLORIDE 0.9% FLUSH: 3.0000 mL | INTRAVENOUS | Status: DC | PRN

## 2020-03-15 MED ORDER — IODIXANOL 320 MG/ML IV SOLN
INTRAVENOUS | Status: DC | PRN
  Administered 2020-03-15: 115 mL

## 2020-03-15 MED ORDER — HEPARIN SODIUM (PORCINE) 1000 UNIT/ML IJ SOLN
INTRAMUSCULAR | Status: AC
  Filled 2020-03-15: qty 1

## 2020-03-15 MED ORDER — HEPARIN (PORCINE) IN NACL 1000-0.9 UT/500ML-% IV SOLN
INTRAVENOUS | Status: DC | PRN
  Administered 2020-03-15: 500 mL

## 2020-03-15 MED ORDER — SODIUM CHLORIDE 0.9 % IV SOLN: 250.0000 mL | INTRAVENOUS | Status: DC | PRN

## 2020-03-15 SURGICAL SUPPLY — 17 items
BAG SNAP BAND KOVER 36X36 (MISCELLANEOUS) ×2 IMPLANT
BALLN MUSTANG 12X60X75 (BALLOONS) ×2
BALLN MUSTANG 9X60X75 (BALLOONS) ×2
BALLOON MUSTANG 12X60X75 (BALLOONS) IMPLANT
BALLOON MUSTANG 9X60X75 (BALLOONS) IMPLANT
CATH BEACON 5 .035 65 KMP TIP (CATHETERS) ×1 IMPLANT
COVER DOME SNAP 22 D (MISCELLANEOUS) ×2 IMPLANT
KIT ENCORE 26 ADVANTAGE (KITS) ×1 IMPLANT
KIT MICROPUNCTURE NIT STIFF (SHEATH) ×1 IMPLANT
PROTECTION STATION PRESSURIZED (MISCELLANEOUS) ×2
SHEATH PINNACLE R/O II 7F 4CM (SHEATH) ×1 IMPLANT
SHEATH PROBE COVER 6X72 (BAG) ×3 IMPLANT
STATION PROTECTION PRESSURIZED (MISCELLANEOUS) ×1 IMPLANT
STOPCOCK MORSE 400PSI 3WAY (MISCELLANEOUS) ×2 IMPLANT
TRAY PV CATH (CUSTOM PROCEDURE TRAY) ×2 IMPLANT
TUBING CIL FLEX 10 FLL-RA (TUBING) ×2 IMPLANT
WIRE BENTSON .035X145CM (WIRE) ×1 IMPLANT

## 2020-03-15 NOTE — Op Note (Signed)
OPERATIVE NOTE   PROCEDURE: 1. left brachiobasilic arteriovenous fistula cannulation under ultrasound guidance 2. left arm fistulogram including central venogram 3. left central venoplasty of subclavian vein (9 mm x 60 mm Mustang and 12 mm x 60 mm Mustang)   PRE-OPERATIVE DIAGNOSIS: Left arm swelling after first stage basilic vein fistula  POST-OPERATIVE DIAGNOSIS: same as above   SURGEON: Marty Heck, MD  ANESTHESIA: local  ESTIMATED BLOOD LOSS: 5 cc  FINDING(S): Patient had a high-grade left subclavian vein stenosis near 95% with multiple collaterals in the chest wall.  Ultimately this was limiting outflow of his left brachiobasilic fistula.  This lesion was crossed and then angioplastied with a 9 mm x 60 mm Mustang and then 12 mm x 60 mm Mustang with a very nice result and no significant residual stenosis.  He has an excellent thrill in the left arm upon completion.  SPECIMEN(S):  None  CONTRAST: 115 cc  INDICATIONS: Devin Harmon is a 64 y.o. male who  presents with left arm swelling after first stage basilic vein fistula.  The patient is scheduled for left arm fistulogram.  The patient is aware the risks include but are not limited to: bleeding, infection, thrombosis of the cannulated access, and possible anaphylactic reaction to the contrast.  The patient is aware of the risks of the procedure and elects to proceed forward.  DESCRIPTION: After full informed written consent was obtained, the patient was brought back to the angiography suite and placed supine upon the angiography table.  The patient was connected to monitoring equipment.  The left arm was prepped and draped in the standard fashion for a left arm fistulogram.  Under ultrasound guidance, the left arteriovenous fistula was evaluated, it was patent, an image was saved.  It was cannulated with a micropuncture needle under ultrasound guidance.  The microwire was advanced into the fistula and the needle was  exchanged for the a microsheath, which was lodged 2 cm into the access.  The wire was removed and the sheath was connected to the IV extension tubing.  Hand injections were completed to image the access from the antecubitum up to the level of axilla under fluorsocpy.  The central venous structures were also imaged by hand injections.  Ultimately elected to intervene on a central venous stenosis with multiple chest wall collaterals.  Used a Bentson wire and exchanged for a short 7 French sheath in the left arm fistula.  Patient was given 3000 units of IV heparin.  Then cross the central venous stenosis with assistance of a KMP catheter and confirmed with hand injection we were in the true lumen.  We then performed balloon angioplasty of the left subclavian vein with a 9 mm x 60 mm Mustang to nominal pressure for 2 minutes.  We then upsized to a 12 mm x 60 mm Mustang initially with a low-profile inflation and a subsequent second inflation to nominal pressure.  He was having a lot of pain so we did not want to be more aggressive.  Final injection showed excellent inline flow with no residual stenosis.  No evidence of more proximal stenosis.  That point time a 4-0 Monocryl purse-string suture was sewn around the sheath.  The sheath was removed while tying down the suture.  A sterile bandage was applied to the puncture site.  Arm was wrapped with ace bandage.  COMPLICATIONS: None  CONDITION: Stable  Plan: Will wrap his left arm today and arrange follow-up in several weeks in  clinic to see if he has significant improvement in left arm swelling before arranging second stage transposition.  If no improvement would likely need this ligated.  Marty Heck, MD [facility]: [phone]  03/15/2020 8:38 AM

## 2020-03-15 NOTE — Discharge Instructions (Signed)
We will arrange follow-up in 2 weeks in clinic for arm check.  Please keep arm elevated and wrapped.  We will have to see how much the swelling improves before deciding to proceed with second stage.   Dialysis Fistulogram  A dialysis fistulogram is an X-ray test to look inside the site where blood is removed and returned to your body during dialysis. Fistulas and grafts provide easy and effective access for dialysis. However, sometimes problems can occur. The fistula or graft can become clogged or narrow. This can make dialysis less effective. You may need to have a fistulogram to check for these problems. If a problem is found, your health care provider can do treatments during the procedure to clear the blocked or narrowed areas. Treatments to open a blocked dialysis fistula or graft may include:  Removing the clot from the fistula or graft with a device (thrombectomy).  A procedure to open or widen the blocked area with a balloon (angioplasty).  Placing a small mesh tube (stent) to keep the fistula or graft open.  Injecting a medicine to dissolve the clot (thrombolysis). Tell a health care provider about:  Any allergies you have, including any reactions you have had to contrast dye.  All medicines you are taking, including vitamins, herbs, eye drops, creams, and over-the-counter medicines.  Any problems you or family members have had with anesthetic medicines.  Any blood disorders you have.  Any surgeries you have had.  Any medical conditions you have.  Whether you are pregnant or may be pregnant.  Any pain, redness, or swelling you have in your limb that has the dialysis fistula or graft.  Any bleeding from your dialysis fistula or graft.  Any of the following in the part of your body where the dialysis fistula or graft leads: ? Numbness. ? Tingling. ? A cold feeling. ? Areas that are bluish or pale white in color. What are the risks? Generally, this is a safe procedure.  However, problems may occur, including:  Infection.  Bleeding.  Allergic reactions to medicines or dyes.  Damage to other structures, such as nearby nerves or blood vessels.  A blood clot in the dialysis fistula or graft.  A blood clot that travels to your lungs (pulmonary embolism). What happens before the procedure? General instructions  Follow instructions from your health care provider about eating and drinking restrictions.  Ask your health care provider about: ? Changing or stopping your regular medicines. This is especially important if you are taking diabetes medicines or blood thinners (anticoagulants). ? Taking medicines such as aspirin and ibuprofen. These medicines can thin your blood. Do not take these medicines unless your health care provider tells you to take them. ? Taking over-the-counter medicines, vitamins, herbs, and supplements.  Plan to have someone take you home from the hospital or clinic.  Plan to have a responsible adult care for you for at least 24 hours after you leave the hospital or clinic. This is important.  You may have a blood or urine sample taken. These will help your health care provider learn how well your kidneys and liver are working and how well your blood clots. What happens during the procedure?  An IV will be inserted into one of your veins.  You will be given one or more of the following: ? A medicine to help you relax (sedative). ? A medicine to numb the area (local anesthetic) on the limb with your fistula or graft.  A needle will be inserted  into your vein.  A small, thin tube (catheter) will be inserted into the needle and guided to the area of possible problems.  Dye (contrast material) will be injected into the catheter. As the contrast material passes through your body, you may feel warm.  X-rays will be taken. The contrast material will show where any narrowing or blockages are.  If narrowing or a blockage is seen, the  health care provider may do one of the following to open the blockage: ? Thrombectomy. The health care provider will use a mechanical device at the end of the catheter to break up the clot. ? Angioplasty. The health care provider will advance a guide wire and balloon-tipped catheter to the blockage site. The balloon will be inflated for a short period of time. A stent may also be placed to keep the blocked area open. ? Thrombolysis. The health care provider will deliver a clot-dissolving medicine through the catheter.  When the procedure is complete, the catheter will be removed.  In some cases, the catheter site will be closed with stitches (sutures).  Pressure will be applied to stop any bleeding, and your skin will be covered with a bandage (dressing). The procedure may vary among health care providers and hospitals. What happens after the procedure?  Your blood pressure, heart rate, breathing rate, and blood oxygen level will be monitored until you leave the hospital or clinic. Your health care provider will also monitor your access site.  You will need to stay in bed for several hours.  Do not drive for 24 hours if you were given a sedative during your procedure. Summary  A dialysis fistulogram is an X-ray test to look inside the site where blood is removed and returned to your body during dialysis.  If a problem is found, your health care provider can also do treatments during the procedure to clear the blocked or narrowed areas. This information is not intended to replace advice given to you by your health care provider. Make sure you discuss any questions you have with your health care provider. Document Revised: 12/18/2017 Document Reviewed: 12/18/2017 Elsevier Patient Education  2020 [employer].

## 2020-03-15 NOTE — H&P (Signed)
History and Physical Interval Note:  03/15/2020 7:45 AM  Lorna Dibble  has presented today for surgery, with the diagnosis of end stage renal disease with complications of fistula.  The various methods of treatment have been discussed with the patient and family. After consideration of risks, benefits and other options for treatment, the patient has consented to  Procedure(s): A/V FISTULAGRAM (Left) as a surgical intervention.  The patient's history has been reviewed, patient examined, no change in status, stable for surgery.  I have reviewed the patient's chart and labs.  Questions were answered to the patient's satisfaction.    Left arm fistulogram.  Marty Heck  Patient name: Devin Harmon MRN: [id number] DOB: 13-Apr-1956 Sex: male  REASON FOR VISIT: Triage visit for left arm swelling after left first stage basilic vein transposition  HPI:  TAG WURTZ is a 64 y.o. male with end-stage renal disease that presents for evaluation of left arm swelling after first stage basilic vein transposition on 02/09/2020. Patient also notes some serous drainage from the left antecubital incision. No fevers or chills. His arm swelling and drainage from incision was noted at dialysis and they contacted our office. He is well-known to our service and previously had a right brachiocephalic fistula with profound arm swelling and ultimately required ligation of this due to a central venous occlusion on the right that was discovered after surgery. Has a [facility] currently using for dialysis. Denies any numbness or tingling in left hand.      Past Medical History:  Diagnosis Date  . Allergy   . Anemia   . BPH (benign prostatic hypertrophy)   . ESRD (end stage renal disease) ([place])    MWF - [place]  . Hypertension   . Leg weakness    right  . Slurred speech   . Zoster 2007   facial, hospital 2007        Past Surgical History:  Procedure Laterality Date  . A/V FISTULAGRAM N/A 12/19/2019   Procedure:  A/V FISTULAGRAM - Right Arm; Surgeon: Waynetta Sandy, MD; Location: [place] [facility]; Service: Cardiovascular; Laterality: N/A;  . AV FISTULA PLACEMENT Right 09/01/2019   Procedure: ARTERIOVENOUS (AV) FISTULA CREATION RIGHT ARM; Surgeon: Marty Heck, MD; [facility]; Service: Vascular; Laterality: Right;  . AV FISTULA PLACEMENT Left 02/09/2020   Procedure: LEFT ARM BASILIC VEIN Arteriovenous FISTULA CREATION; Surgeon: Marty Heck, MD; Location: [place]; Service: Vascular; Laterality: Left;  . DIALYSIS/PERMA CATHETER INSERTION N/A 04/15/2019   Procedure: DIALYSIS/PERMA CATHETER INSERTION; Surgeon: Katha Cabal, MD; Location: [place] [facility]; Service: Cardiovascular; Laterality: N/A;  . INSERTION OF DIALYSIS CATHETER Left 11/15/2019   Procedure: INSERTION OF LEFT TUNNEL DIALYSIS CATHETER; Surgeon: Waynetta Sandy, MD; Location: [place]; Service: Vascular; Laterality: Left;  . LIGATION OF ARTERIOVENOUS FISTULA Right 01/03/2020   Procedure: LIGATION OF ARTERIOVENOUS FISTULA; Surgeon: Serafina Mitchell, MD; Location: [place]; Service: Vascular; Laterality: Right;  . PERIPHERAL VASCULAR INTERVENTION Right 12/19/2019   Procedure: PERIPHERAL VASCULAR INTERVENTION; Surgeon: Waynetta Sandy, MD; Location: [place] [facility]; Service: Cardiovascular; Laterality: Right;  . REMOVAL OF A DIALYSIS CATHETER Right 11/15/2019   Procedure: REMOVAL OF A RIGHT TUNNEL DIALYSIS CATHETER; Surgeon: Waynetta Sandy, MD; Location: [place]; Service: Vascular; Laterality: Right;        Family History  Problem Relation Age of Onset  . Hypertension Mother   . Cancer Mother    lung cancer  . Hypertension Brother   . Heart disease  Father   . Coronary artery disease Neg Hx   . Diabetes Neg Hx    SOCIAL HISTORY:  Social History        Tobacco Use  . Smoking status: Never Smoker  . Smokeless tobacco: Never Used  Substance Use Topics  . Alcohol use: Not  Currently    Comment: occasional but none since 2019        Allergies  Allergen Reactions  . Sulfa Antibiotics Hives  . Sulfonamide Derivatives Hives  . Chlorthalidone Other (See Comments)    felt "bad" and ED   . Hydrocodone Nausea Only    dizzy         Current Outpatient Medications  Medication Sig Dispense Refill  . acetaminophen (TYLENOL) 500 MG tablet Take 500-1,000 mg by mouth every 6 (six) hours as needed for mild pain.    Marland Kitchen amLODipine (NORVASC) 10 MG tablet TAKE 1 TABLET BY MOUTH EVERY DAY (Patient taking differently: Take 10 mg by mouth daily. ) 90 tablet 3  . AURYXIA 1 GM 210 MG(Fe) tablet Take 420 mg by mouth 3 (three) times daily with meals.     . B Complex-C-Folic Acid (RENA-VITE RX) 1 MG TABS Take 1 mg by mouth daily.    . cetirizine (ZYRTEC) 10 MG tablet Take 10 mg by mouth every evening.     . fluticasone (FLONASE) 50 MCG/ACT nasal spray Place 1-2 sprays into both nostrils daily as needed for allergies.     . hydrocortisone cream 1 % Apply 1 application topically daily as needed (rash).    . labetalol (NORMODYNE) 100 MG tablet TAKE 1 TABLET BY MOUTH TWICE A DAY (Patient taking differently: Take 100 mg by mouth 2 (two) times daily. ) 180 tablet 3  . neomycin-bacitracin-polymyxin (NEOSPORIN) ointment Apply 1 application topically as needed for wound care.    . traMADol (ULTRAM) 50 MG tablet Take 1 tablet (50 mg total) by mouth every 6 (six) hours as needed. 15 tablet 0  . oxyCODONE-acetaminophen (PERCOCET) 7.5-325 MG tablet Take 1 tablet by mouth every 4 (four) hours as needed for severe pain. (Patient not taking: Reported on 02/21/2020) 6 tablet 0   No current facility-administered medications for this visit.   REVIEW OF SYSTEMS:  [X]  denotes positive finding, [ ]  denotes negative finding  Cardiac  Comments:  Chest pain or chest pressure:    Shortness of breath upon exertion:    Short of breath when lying flat:    Irregular heart rhythm:        Vascular    Pain in  calf, thigh, or hip brought on by ambulation:    Pain in feet at night that wakes you up from your sleep:     Blood clot in your veins:    Leg swelling:         Pulmonary    Oxygen at home:    Productive cough:     Wheezing:         Neurologic    Sudden weakness in arms or legs:     Sudden numbness in arms or legs:     Sudden onset of difficulty speaking or slurred speech:    Temporary loss of vision in one eye:     Problems with dizziness:         Gastrointestinal    Blood in stool:     Vomited blood:         Genitourinary    Burning when urinating:  Blood in urine:        Psychiatric    Major depression:         Hematologic    Bleeding problems:    Problems with blood clotting too easily:        Skin    Rashes or ulcers:        Constitutional    Fever or chills:    PHYSICAL EXAM:     Vitals:   02/21/20 1359  BP: (!) 145/79  Pulse: 60  Resp: 20  Temp: 97.9 F (36.6 C)  TempSrc: Temporal  SpO2: 98%  Weight: 210 lb (95.3 kg)  Height: 5\' 7"  (1.702 m)   GENERAL: The patient is a well-nourished male, in no acute distress. The vital signs are documented above.  CARDIAC: There is a regular rate and rhythm.  VASCULAR:  Left brachiobasilic fistula with good thrill  Palpable left radial pulse  Left arm swelling 2+  Serous drainage from left arm incision  DATA:  None  Assessment/Plan:  64 year old male presents with left arm swelling and serous drainage from his left arm incision after first stage basilic vein transposition on 02/09/20. Discussed with him and his wife that there is no active signs of infection at this time. I do not think he has steal either, palpable radial pulse and no motor or sensory symptoms in left hand. I have recommended that they do dry dressing changes several times daily to the drainage from antecubital incision and wrap his arm from the hand up to the shoulder with an Ace wrap for gentle compression to help with the swelling. Will  schedule him for left arm fistulogram to evaluate for stenosis versus occlusion on the left similar to what he had on the right. I would like to get him several weeks out from surgery before proceeding with left arm fistulogram. His wife will call if symptoms worsen. Doesn't feel the left arm swelling is as severe as the right arm.  Marty Heck, MD  [facility] [place]  Office: [phone]

## 2020-03-20 ENCOUNTER — Ambulatory Visit (HOSPITAL_COMMUNITY): Payer: 59

## 2020-04-04 ENCOUNTER — Telehealth (HOSPITAL_COMMUNITY): Payer: Self-pay

## 2020-04-04 NOTE — Telephone Encounter (Signed)

## 2020-04-05 ENCOUNTER — Ambulatory Visit (INDEPENDENT_AMBULATORY_CARE_PROVIDER_SITE_OTHER): Payer: Self-pay | Admitting: Physician Assistant

## 2020-04-05 ENCOUNTER — Other Ambulatory Visit: Payer: Self-pay

## 2020-04-05 VITALS — BP 142/84 | HR 60 | Temp 97.5°F | Resp 24 | Wt 225.0 lb

## 2020-04-05 DIAGNOSIS — N186 End stage renal disease: Secondary | ICD-10-CM

## 2020-04-05 NOTE — Progress Notes (Signed)
Established Dialysis Access   History of Present Illness   Devin Harmon is a 64 y.o. (Apr 04, 1956) male who presents for re-evaluation of permanent access.  Surgical history significant for right brachiocephalic fistula creation which subsequently required ligation due to right arm edema and central outflow venous occlusion.  He then underwent left for stage basilic vein transposition by Dr. Carlis Abbott on 02/09/2020.  He then developed a swollen left arm.  He underwent fistulogram on 03/15/2020 which demonstrated severe stenosis of subclavian vein which was successfully ballooned.  He returns to clinic today with minimal change in edema of left arm.  It should be noted that he has a left IJ [facility] which she is currently using for dialysis on a Monday Wednesday Friday schedule.  His wife is present and provides patient's history.  She states the dialysis center has discussed pulling more fluid off during dialysis treatments and this will begin next week.  Patient is not bothered much by his left arm edema.  He is able to elevate his arm during the day.  He has also able to tolerate Ace wrap on his left arm.  Patient and wife are not ready to abandon left arm fistula at this time.    The patient's PMH, PSH, SH, and FamHx were reviewed and are unchanged from prior visit.  Current Outpatient Medications  Medication Sig Dispense Refill  . acetaminophen (TYLENOL) 500 MG tablet Take 1,000 mg by mouth every 6 (six) hours as needed for mild pain.     Marland Kitchen amLODipine (NORVASC) 10 MG tablet TAKE 1 TABLET BY MOUTH EVERY DAY (Patient taking differently: Take 10 mg by mouth daily. ) 90 tablet 3  . AURYXIA 1 GM 210 MG(Fe) tablet Take 420 mg by mouth 3 (three) times daily with meals.     . B Complex-C-Folic Acid (RENA-VITE RX) 1 MG TABS Take 1 mg by mouth daily.    . cetirizine (ZYRTEC) 10 MG tablet Take 10 mg by mouth daily as needed for allergies.     . fluticasone (FLONASE) 50 MCG/ACT nasal spray Place 1-2 sprays into  both nostrils daily as needed for allergies.     . hydrocortisone cream 1 % Apply 1 application topically daily as needed (rash).    . labetalol (NORMODYNE) 100 MG tablet TAKE 1 TABLET BY MOUTH TWICE A DAY (Patient taking differently: Take 100 mg by mouth 2 (two) times daily. ) 180 tablet 3  . neomycin-bacitracin-polymyxin (NEOSPORIN) ointment Apply 1 application topically as needed for wound care.    Marland Kitchen oxyCODONE-acetaminophen (PERCOCET) 7.5-325 MG tablet Take 1 tablet by mouth every 4 (four) hours as needed for severe pain. 6 tablet 0  . traMADol (ULTRAM) 50 MG tablet Take 1 tablet (50 mg total) by mouth every 6 (six) hours as needed. 15 tablet 0   No current facility-administered medications for this visit.    REVIEW OF SYSTEMS (negative unless checked):   Cardiac:  []  Chest pain or chest pressure? []  Shortness of breath upon activity? []  Shortness of breath when lying flat? []  Irregular heart rhythm?  Vascular:  []  Pain in calf, thigh, or hip brought on by walking? []  Pain in feet at night that wakes you up from your sleep? []  Blood clot in your veins? []  Leg swelling?  Pulmonary:  []  Oxygen at home? []  Productive cough? []  Wheezing?  Neurologic:  []  Sudden weakness in arms or legs? []  Sudden numbness in arms or legs? []  Sudden onset of difficult speaking or  slurred speech? []  Temporary loss of vision in one eye? []  Problems with dizziness?  Gastrointestinal:  []  Blood in stool? []  Vomited blood?  Genitourinary:  []  Burning when urinating? []  Blood in urine?  Psychiatric:  []  Major depression  Hematologic:  []  Bleeding problems? []  Problems with blood clotting?  Dermatologic:  []  Rashes or ulcers?  Constitutional:  []  Fever or chills?  Ear/Nose/Throat:  []  Change in hearing? []  Nose bleeds? []  Sore throat?  Musculoskeletal:  []  Back pain? []  Joint pain? []  Muscle pain?   Physical Examination   Vitals:   04/05/20 1004  BP: (!) 142/84  Pulse:  60  Resp: (!) 24  Temp: (!) 97.5 F (36.4 C)  TempSrc: Temporal  SpO2: 94%  Weight: 225 lb (102.1 kg)   Body mass index is 35.24 kg/m.  General:  WDWN in NAD; vital signs documented above Gait: Not observed HENT: WNL, normocephalic Pulmonary: normal non-labored breathing , without Rales, rhonchi,  wheezing Cardiac: regular HR Abdomen: soft, NT, no masses Skin: without rashes Vascular Exam/Pulses:  Right Left  Radial 2+ (normal) 2+ (normal)   Extremities: Easily palpable thrill left arm basilic vein fistula; pitting edema left upper arm; Musculoskeletal: no muscle wasting or atrophy  Neurologic: A&O X 3;  No focal weakness or paresthesias are detected Psychiatric:  The pt has Normal affect.    Medical Decision Making   Devin Harmon is a 64 y.o. male who presents with ESRD requiring hemodialysis.   -Left arm for stage basilic vein fistula is patent with a palpable thrill; no signs of steal syndrome -Per patient and his wife left arm edema with minimal change since outflow vein venoplasty on 03/15/2020 -Given left arm edema, I would not proceed with second stage transposition at this time -Patient's wife states the patient's nephrologist has arranged to remove more fluid during HD treatment starting next week in an attempt to improve left arm edema -Patient will follow up in 3 to 4 weeks with Dr. Carlis Abbott for evaluation of left arm edema; if no improvement is noted by that time, the decision will likely be made to ligate left arm fistula -Continue gentle ACE and elevation -Patient and wife are in agreement with this plan and will call/return to office sooner if edema worsens   Dagoberto Ligas PA-C [facility] [place] Office: [phone]  Clinic MD: Devin Harmon

## 2020-04-09 ENCOUNTER — Telehealth: Payer: Self-pay | Admitting: Internal Medicine

## 2020-04-09 ENCOUNTER — Encounter: Payer: Self-pay | Admitting: Internal Medicine

## 2020-04-09 NOTE — Telephone Encounter (Signed)
Letter done Please mail to her (if that is what she requests) No charge

## 2020-04-09 NOTE — Telephone Encounter (Signed)
Pt's wife called because she is filling out his medicare ppw and they need a letter stating that Devin Harmon has slurred speech and is unable to speak clearly.

## 2020-04-09 NOTE — Telephone Encounter (Signed)
I notified patient's wife that form is ready for pick up.  She wanted to come by and pick up letter.

## 2020-05-01 ENCOUNTER — Other Ambulatory Visit: Payer: Self-pay

## 2020-05-01 ENCOUNTER — Ambulatory Visit (INDEPENDENT_AMBULATORY_CARE_PROVIDER_SITE_OTHER): Payer: Self-pay | Admitting: Vascular Surgery

## 2020-05-01 ENCOUNTER — Encounter: Payer: Self-pay | Admitting: Vascular Surgery

## 2020-05-01 VITALS — BP 146/75 | HR 55 | Temp 97.5°F | Resp 16 | Ht 69.0 in | Wt 205.0 lb

## 2020-05-01 DIAGNOSIS — N186 End stage renal disease: Secondary | ICD-10-CM

## 2020-05-01 NOTE — Progress Notes (Signed)
Patient name: Devin Harmon MRN: [id number] DOB: [birth year] Sex: male  REASON FOR VISIT: Ongoing follow-up for left arm swelling after left first stage basilic vein transposition  HPI: ALIJAH Harmon is a 64 y.o. male with end-stage renal disease that presents for ongoing follow-up of left arm swelling after first stage basilic vein transposition on 02/09/2020.  Ultimately we took him for left arm fistulogram on 03/15/2020 and performed a central venoplasty of the subclavian vein with a 9 x 60 and 12 x 60 [place].  On follow-up today he and his wife feel like the left arm swelling is significantly improved.  His incision is now completely healed.  Still has a good thrill.  Currently doing dialysis on Monday Wednesday Friday through left IJ tunnel catheter. Past Medical History:  Diagnosis Date  . Allergy   . Anemia   . BPH (benign prostatic hypertrophy)   . ESRD (end stage renal disease) ([place])    MWF - [place]  . Hypertension   . Leg weakness    right  . Slurred speech   . Zoster 2007   facial, hospital 2007    Past Surgical History:  Procedure Laterality Date  . A/V FISTULAGRAM N/A 12/19/2019   Procedure: A/V FISTULAGRAM - Right Arm;  Surgeon: Waynetta Sandy, MD;  Location: [place] [facility];  Service: Cardiovascular;  Laterality: N/A;  . A/V FISTULAGRAM Left 03/15/2020   Procedure: A/V FISTULAGRAM;  Surgeon: Marty Heck, MD;  Location: [place] [facility];  Service: Cardiovascular;  Laterality: Left;  . AV FISTULA PLACEMENT Right 09/01/2019   Procedure: ARTERIOVENOUS (AV) FISTULA CREATION RIGHT ARM;  Surgeon: Marty Heck, MD;  Location: [facility] OR;  Service: Vascular;  Laterality: Right;  . AV FISTULA PLACEMENT Left 02/09/2020   Procedure: LEFT ARM BASILIC VEIN Arteriovenous FISTULA  CREATION;  Surgeon: Marty Heck, MD;  Location: [place];  Service: Vascular;  Laterality: Left;  . DIALYSIS/PERMA CATHETER INSERTION N/A 04/15/2019   Procedure: DIALYSIS/PERMA  CATHETER INSERTION;  Surgeon: Katha Cabal, MD;  Location: [place] [facility];  Service: Cardiovascular;  Laterality: N/A;  . INSERTION OF DIALYSIS CATHETER Left 11/15/2019   Procedure: INSERTION OF LEFT TUNNEL DIALYSIS CATHETER;  Surgeon: Waynetta Sandy, MD;  Location: [place];  Service: Vascular;  Laterality: Left;  . LIGATION OF ARTERIOVENOUS  FISTULA Right 01/03/2020   Procedure: LIGATION OF ARTERIOVENOUS  FISTULA;  Surgeon: Serafina Mitchell, MD;  Location: [place];  Service: Vascular;  Laterality: Right;  . PERIPHERAL VASCULAR BALLOON ANGIOPLASTY Left 03/15/2020   Procedure: PERIPHERAL VASCULAR BALLOON ANGIOPLASTY;  Surgeon: Marty Heck, MD;  Location: [place] [facility];  Service: Cardiovascular;  Laterality: Left;  UPPER ARM  . PERIPHERAL VASCULAR INTERVENTION Right 12/19/2019   Procedure: PERIPHERAL VASCULAR INTERVENTION;  Surgeon: Waynetta Sandy, MD;  Location: [place] [facility];  Service: Cardiovascular;  Laterality: Right;  . REMOVAL OF A DIALYSIS CATHETER Right 11/15/2019   Procedure: REMOVAL OF A RIGHT TUNNEL DIALYSIS CATHETER;  Surgeon: Waynetta Sandy, MD;  Location: [place];  Service: Vascular;  Laterality: Right;    Family History  Problem Relation Age of Onset  . Hypertension Mother   . Cancer Mother        lung cancer  . Hypertension Brother   . Heart disease Father   . Coronary artery disease Neg Hx   . Diabetes Neg Hx     SOCIAL HISTORY: Social History   Tobacco Use  . Smoking status:  Never Smoker  . Smokeless tobacco: Never Used  Substance Use Topics  . Alcohol use: Not Currently    Comment: occasional but none since 2019    Allergies  Allergen Reactions  . Sulfa Antibiotics Hives  . Sulfonamide Derivatives Hives  . Chlorthalidone Other (See Comments)    felt "bad" and ED   . Hydrocodone Nausea Only    dizzy    Current Outpatient Medications  Medication Sig Dispense Refill  . acetaminophen (TYLENOL) 500 MG  tablet Take 1,000 mg by mouth every 6 (six) hours as needed for mild pain.     Marland Kitchen amLODipine (NORVASC) 10 MG tablet TAKE 1 TABLET BY MOUTH EVERY DAY (Patient taking differently: Take 10 mg by mouth daily. ) 90 tablet 3  . AURYXIA 1 GM 210 MG(Fe) tablet Take 420 mg by mouth 3 (three) times daily with meals.     . B Complex-C-Folic Acid (RENA-VITE RX) 1 MG TABS Take 1 mg by mouth daily.    . cetirizine (ZYRTEC) 10 MG tablet Take 10 mg by mouth daily as needed for allergies.     . fluticasone (FLONASE) 50 MCG/ACT nasal spray Place 1-2 sprays into both nostrils daily as needed for allergies.     . hydrocortisone cream 1 % Apply 1 application topically daily as needed (rash).    . labetalol (NORMODYNE) 100 MG tablet TAKE 1 TABLET BY MOUTH TWICE A DAY (Patient taking differently: Take 100 mg by mouth 2 (two) times daily. ) 180 tablet 3  . neomycin-bacitracin-polymyxin (NEOSPORIN) ointment Apply 1 application topically as needed for wound care.    . traMADol (ULTRAM) 50 MG tablet Take 1 tablet (50 mg total) by mouth every 6 (six) hours as needed. 15 tablet 0  . oxyCODONE-acetaminophen (PERCOCET) 7.5-325 MG tablet Take 1 tablet by mouth every 4 (four) hours as needed for severe pain. 6 tablet 0   No current facility-administered medications for this visit.    REVIEW OF SYSTEMS:  [X]  denotes positive finding, [ ]  denotes negative finding Cardiac  Comments:  Chest pain or chest pressure:    Shortness of breath upon exertion:    Short of breath when lying flat:    Irregular heart rhythm:        Vascular    Pain in calf, thigh, or hip brought on by ambulation:    Pain in feet at night that wakes you up from your sleep:     Blood clot in your veins:    Leg swelling:         Pulmonary    Oxygen at home:    Productive cough:     Wheezing:         Neurologic    Sudden weakness in arms or legs:     Sudden numbness in arms or legs:     Sudden onset of difficulty speaking or slurred speech:      Temporary loss of vision in one eye:     Problems with dizziness:         Gastrointestinal    Blood in stool:     Vomited blood:         Genitourinary    Burning when urinating:     Blood in urine:        Psychiatric    Major depression:         Hematologic    Bleeding problems:    Problems with blood clotting too easily:  Skin    Rashes or ulcers:        Constitutional    Fever or chills:      PHYSICAL EXAM: Vitals:   05/01/20 0850  BP: (!) 146/75  Pulse: (!) 55  Resp: 16  Temp: (!) 97.5 F (36.4 C)  TempSrc: Temporal  Weight: 205 lb (93 kg)  Height: 5\' 9"  (1.753 m)    GENERAL: The patient is a well-nourished male, in no acute distress. The vital signs are documented above. CARDIAC: There is a regular rate and rhythm.  VASCULAR:  Left brachiobasilic fistula with good thrill Left arm swelling significantly improved Left arm incision well healed  DATA:   None  Assessment/Plan:  64 year old male presents for ongoing evaluation of left arm swelling after left first stage basilic vein transposition on 02/09/20.  Since undergoing fistulogram with central subclavian vein venoplasty his left arm swelling now significantly improved.  I reviewed the pictures and again the basilic vein looks like it has matured nicely.  He has a good thrill on exam.  Subsequently recommended proceeding with a left second stage basilic vein transposition.  Risk and benefits were discussed in detail.  Ultimately family would like to proceed and we will schedule for 05/17/20 on a nondialysis day.  Certainly discussed that this subclavian vein stenosis may be a problem again in the future and likely will require future interventions.   Marty Heck, MD [facility] [place] Office: [phone]

## 2020-05-15 ENCOUNTER — Other Ambulatory Visit (HOSPITAL_COMMUNITY)
Admission: RE | Admit: 2020-05-15 | Discharge: 2020-05-15 | Disposition: A | Discharge status: Home / Self Care | Payer: 59 | Source: Ambulatory Visit | Attending: Vascular Surgery | Admitting: Vascular Surgery

## 2020-05-15 DIAGNOSIS — Z01812 Encounter for preprocedural laboratory examination: Secondary | ICD-10-CM | POA: Diagnosis present

## 2020-05-15 DIAGNOSIS — Z20822 Contact with and (suspected) exposure to covid-19: Secondary | ICD-10-CM | POA: Insufficient documentation

## 2020-05-15 LAB — SARS CORONAVIRUS 2 (TAT 6-24 HRS): SARS Coronavirus 2: NEGATIVE

## 2020-05-15 MED ORDER — FENTANYL CITRATE (PF) 100 MCG/2ML IJ SOLN: 25.0000 ug | INTRAMUSCULAR | Status: DC | PRN

## 2020-05-16 ENCOUNTER — Encounter (HOSPITAL_COMMUNITY): Payer: Self-pay | Admitting: Vascular Surgery

## 2020-05-16 ENCOUNTER — Other Ambulatory Visit: Payer: Self-pay

## 2020-05-16 NOTE — Anesthesia Preprocedure Evaluation (Addendum)
Anesthesia Evaluation  Patient identified by MRN, date of birth, ID band Patient awake    Reviewed: Allergy & Precautions, NPO status , Patient's Chart, lab work & pertinent test results, reviewed documented beta blocker date and time   Airway Mallampati: III  TM Distance: >3 FB Neck ROM: Full    Dental  (+) Dental Advisory Given, Poor Dentition   Pulmonary neg pulmonary ROS,    Pulmonary exam normal breath sounds clear to auscultation       Cardiovascular hypertension, Pt. on home beta blockers and Pt. on medications Normal cardiovascular exam Rhythm:Regular Rate:Normal     Neuro/Psych CVA negative psych ROS   GI/Hepatic negative GI ROS, Neg liver ROS,   Endo/Other  Obesity   Renal/GU ESRF and DialysisRenal disease (MWF)     Musculoskeletal negative musculoskeletal ROS (+)   Abdominal   Peds  Hematology  (+) Blood dyscrasia, anemia ,   Anesthesia Other Findings Day of surgery medications reviewed with the patient.  Reproductive/Obstetrics                                                            Anesthesia Evaluation  Patient identified by MRN, date of birth, ID band Patient awake    Reviewed: Allergy & Precautions, H&P , NPO status , Patient's Chart, lab work & pertinent test results  Airway Mallampati: III  TM Distance: >3 FB Neck ROM: Full    Dental no notable dental hx. (+) Teeth Intact, Dental Advisory Given   Pulmonary neg pulmonary ROS,    Pulmonary exam normal breath sounds clear to auscultation       Cardiovascular Exercise Tolerance: Good hypertension, Pt. on medications  Rhythm:Regular Rate:Normal     Neuro/Psych CVA, Residual Symptoms negative psych ROS   GI/Hepatic negative GI ROS, Neg liver ROS,   Endo/Other  negative endocrine ROS  Renal/GU ESRF and DialysisRenal disease  negative genitourinary   Musculoskeletal   Abdominal   Peds   Hematology  (+) Blood dyscrasia, anemia ,   Anesthesia Other Findings   Reproductive/Obstetrics negative OB ROS                           Anesthesia Physical Anesthesia Plan  ASA: III  Anesthesia Plan: General   Post-op Pain Management:    Induction: Intravenous  PONV Risk Score and Plan: 3 and Ondansetron, Dexamethasone and Treatment may vary due to age or medical condition  Airway Management Planned: LMA  Additional Equipment:   Intra-op Plan:   Post-operative Plan: Extubation in OR  Informed Consent: I have reviewed the patients History and Physical, chart, labs and discussed the procedure including the risks, benefits and alternatives for the proposed anesthesia with the patient or authorized representative who has indicated his/her understanding and acceptance.     Dental advisory given  Plan Discussed with: CRNA  Anesthesia Plan Comments:         Anesthesia Quick Evaluation                                   Anesthesia Evaluation  Patient identified by MRN, date of birth, ID band Patient awake    Reviewed: Allergy & Precautions, H&P , NPO  status , Patient's Chart, lab work & pertinent test results  Airway Mallampati: III  TM Distance: >3 FB Neck ROM: Full    Dental no notable dental hx. (+) Teeth Intact, Dental Advisory Given   Pulmonary neg pulmonary ROS,    Pulmonary exam normal breath sounds clear to auscultation       Cardiovascular Exercise Tolerance: Good hypertension, Pt. on medications  Rhythm:Regular Rate:Normal     Neuro/Psych CVA, Residual Symptoms negative psych ROS   GI/Hepatic negative GI ROS, Neg liver ROS,   Endo/Other  negative endocrine ROS  Renal/GU ESRF and DialysisRenal disease  negative genitourinary   Musculoskeletal   Abdominal   Peds  Hematology  (+) Blood dyscrasia, anemia ,   Anesthesia Other Findings   Reproductive/Obstetrics negative OB ROS                            Anesthesia Physical Anesthesia Plan  ASA: III  Anesthesia Plan: General   Post-op Pain Management:    Induction: Intravenous  PONV Risk Score and Plan: 3 and Ondansetron, Dexamethasone and Treatment may vary due to age or medical condition  Airway Management Planned: LMA  Additional Equipment:   Intra-op Plan:   Post-operative Plan: Extubation in OR  Informed Consent: I have reviewed the patients History and Physical, chart, labs and discussed the procedure including the risks, benefits and alternatives for the proposed anesthesia with the patient or authorized representative who has indicated his/her understanding and acceptance.     Dental advisory given  Plan Discussed with: CRNA  Anesthesia Plan Comments:         Anesthesia Quick Evaluation  Anesthesia Physical Anesthesia Plan  ASA: III  Anesthesia Plan: General   Post-op Pain Management:    Induction: Intravenous  PONV Risk Score and Plan: 2 and Dexamethasone and Ondansetron  Airway Management Planned: Oral ETT  Additional Equipment:   Intra-op Plan:   Post-operative Plan: Extubation in OR  Informed Consent: I have reviewed the patients History and Physical, chart, labs and discussed the procedure including the risks, benefits and alternatives for the proposed anesthesia with the patient or authorized representative who has indicated his/her understanding and acceptance.     Dental advisory given  Plan Discussed with: CRNA  Anesthesia Plan Comments: (PAT note by Karoline Caldwell, PA-C: Right hemiparesis due to suspected CVA.  Per PCP note 02/07/2020, "right hemiparesis: Doesn't seem to be related to fluid. I suspect CVA (though CT scan some months ago didn't show one). I believe he has vascular damage causing the cognitive and speech changes as well. Would not use statin with ESRD. Don't believe he is an aspirin candidate. Will send note to Dr Holley Raring for  his review.Fortunately, he is left handed."  Recently had left stage 1 fistula creation 04/28/4131 without complication.  Originally underwent first stage fistula creation 09/01/2019 but this was subsequently ligated due to right arm edema and central outflow venous occlusion.  ESRD on HD via Left IJ MWF.  Will need day of surgery labs and eval.  EKG 02/09/20: Normal sinus rhythm. Rate 62. Possible Left atrial enlargement. Rightward axis. Possible Anterior infarct , age undetermined  TTE 04/13/19: 1. The left ventricle has normal systolic function, with an ejection  fraction of 55-60%. The cavity size was normal. There is mildly increased  left ventricular wall thickness. Left ventricular diastolic Doppler  parameters are consistent with  pseudonormalization. Elevated mean left atrial pressure.  2. The right ventricle  has normal systolic function. The cavity was  normal. There is no increase in right ventricular wall thickness.  3. Left atrial size was mildly dilated.  4. Moderate pleural effusion in the left lateral region.  5. No evidence of mitral valve stenosis.  6. The aortic valve is tricuspid. Mild thickening of the aortic valve.  7. The aortic root is normal in size and structure.  8. Mildly dilated pulmonary artery.   )       Anesthesia Quick Evaluation

## 2020-05-16 NOTE — Progress Notes (Signed)
Spoke with patient's wife Devin Harmon [phone].  Devin Harmon states patient does not have SOB, fever, cough or chest pain.  PCP - Dr Viviana Simpler Cardiologist - n/a  Chest x-ray - 11/15/19 (1V) EKG - 02/09/20 Stress Test - n/a ECHO - 04/13/19 Cardiac Cath - n/a  Anesthesia review: Yes  STOP now taking any Aspirin (unless otherwise instructed by your surgeon), Aleve, Naproxen, Ibuprofen, Motrin, Advil, Goody's, BC's, all herbal medications, fish oil, and all vitamins.   Coronavirus Screening Covid test on 05/15/20 was negative.   Wife Devin Harmon verbalized understanding of instructions that were given via phone.

## 2020-05-16 NOTE — Progress Notes (Signed)
Anesthesia Chart Review:  Right hemiparesis due to suspected CVA.  Per PCP note 02/07/2020, "right hemiparesis:Doesn't seem to be related to fluid. I suspect CVA (though CT scan some months ago didn't show one). I believe he has vascular damage causing the cognitive and speech changes as well. Would not use statin with ESRD. Don't believe he is an aspirin candidate. Will send note to Dr Holley Raring for his review.Fortunately, he is left handed."  Recently had left stage 1 fistula creation 4/74/2595 without complication.  Originally underwent first stage fistula creation 09/01/2019 but this was subsequently ligated due to right arm edema and central outflow venous occlusion.  ESRD on HD via Left IJ MWF.  Will need day of surgery labs and eval.  EKG 02/09/20: Normal sinus rhythm. Rate 62. Possible Left atrial enlargement. Rightward axis. Possible Anterior infarct , age undetermined  TTE 04/13/19: 1. The left ventricle has normal systolic function, with an ejection  fraction of 55-60%. The cavity size was normal. There is mildly increased  left ventricular wall thickness. Left ventricular diastolic Doppler  parameters are consistent with  pseudonormalization. Elevated mean left atrial pressure.  2. The right ventricle has normal systolic function. The cavity was  normal. There is no increase in right ventricular wall thickness.  3. Left atrial size was mildly dilated.  4. Moderate pleural effusion in the left lateral region.  5. No evidence of mitral valve stenosis.  6. The aortic valve is tricuspid. Mild thickening of the aortic valve.  7. The aortic root is normal in size and structure.  8. Mildly dilated pulmonary artery.    Wynonia Musty [facility] Short Stay Center/Anesthesiology Phone [phone] 05/16/2020 11:13 AM

## 2020-05-17 ENCOUNTER — Encounter (HOSPITAL_COMMUNITY): Payer: Self-pay | Admitting: Vascular Surgery

## 2020-05-17 ENCOUNTER — Encounter (HOSPITAL_COMMUNITY)
Admission: RE | Disposition: A | Discharge status: Home / Self Care | Payer: Self-pay | Source: Home / Self Care | Attending: Vascular Surgery

## 2020-05-17 ENCOUNTER — Ambulatory Visit (HOSPITAL_COMMUNITY): Payer: 59 | Admitting: Physician Assistant

## 2020-05-17 ENCOUNTER — Other Ambulatory Visit: Payer: Self-pay

## 2020-05-17 ENCOUNTER — Ambulatory Visit (HOSPITAL_COMMUNITY)
Admission: RE | Admit: 2020-05-17 | Discharge: 2020-05-17 | Disposition: A | Discharge status: Home / Self Care | Payer: 59 | Attending: Vascular Surgery | Admitting: Vascular Surgery

## 2020-05-17 DIAGNOSIS — N185 Chronic kidney disease, stage 5: Secondary | ICD-10-CM | POA: Diagnosis not present

## 2020-05-17 DIAGNOSIS — N186 End stage renal disease: Secondary | ICD-10-CM | POA: Diagnosis not present

## 2020-05-17 DIAGNOSIS — Z452 Encounter for adjustment and management of vascular access device: Secondary | ICD-10-CM | POA: Insufficient documentation

## 2020-05-17 DIAGNOSIS — I120 Hypertensive chronic kidney disease with stage 5 chronic kidney disease or end stage renal disease: Secondary | ICD-10-CM | POA: Diagnosis not present

## 2020-05-17 DIAGNOSIS — Z9862 Peripheral vascular angioplasty status: Secondary | ICD-10-CM | POA: Diagnosis not present

## 2020-05-17 DIAGNOSIS — Z885 Allergy status to narcotic agent status: Secondary | ICD-10-CM | POA: Insufficient documentation

## 2020-05-17 DIAGNOSIS — Z882 Allergy status to sulfonamides status: Secondary | ICD-10-CM | POA: Insufficient documentation

## 2020-05-17 DIAGNOSIS — Z8249 Family history of ischemic heart disease and other diseases of the circulatory system: Secondary | ICD-10-CM | POA: Insufficient documentation

## 2020-05-17 DIAGNOSIS — Z79899 Other long term (current) drug therapy: Secondary | ICD-10-CM | POA: Diagnosis not present

## 2020-05-17 DIAGNOSIS — Z992 Dependence on renal dialysis: Secondary | ICD-10-CM | POA: Diagnosis not present

## 2020-05-17 HISTORY — PX: BASCILIC VEIN TRANSPOSITION: SHX5742

## 2020-05-17 LAB — POCT I-STAT, CHEM 8
BUN: 50 mg/dL — ABNORMAL HIGH (ref 8–23)
Calcium, Ion: 1.02 mmol/L — ABNORMAL LOW (ref 1.15–1.40)
Chloride: 99 mmol/L (ref 98–111)
Creatinine, Ser: 11.1 mg/dL — ABNORMAL HIGH (ref 0.61–1.24)
Glucose, Bld: 81 mg/dL (ref 70–99)
HCT: 35 % — ABNORMAL LOW (ref 39.0–52.0)
Hemoglobin: 11.9 g/dL — ABNORMAL LOW (ref 13.0–17.0)
Potassium: 5.1 mmol/L (ref 3.5–5.1)
Sodium: 136 mmol/L (ref 135–145)
TCO2: 26 mmol/L (ref 22–32)

## 2020-05-17 SURGERY — TRANSPOSITION, VEIN, BASILIC
Anesthesia: General | Site: Arm Upper | Laterality: Left

## 2020-05-17 MED ORDER — SODIUM CHLORIDE 0.9 % IV SOLN: INTRAVENOUS | Status: DC

## 2020-05-17 MED ORDER — PHENYLEPHRINE HCL-NACL 10-0.9 MG/250ML-% IV SOLN
INTRAVENOUS | Status: DC | PRN
Start: 2020-05-17 — End: 2020-05-17
  Administered 2020-05-17: 30 ug/min via INTRAVENOUS

## 2020-05-17 MED ORDER — ONDANSETRON HCL 4 MG/2ML IJ SOLN
INTRAMUSCULAR | Status: DC | PRN
  Administered 2020-05-17: 4 mg via INTRAVENOUS

## 2020-05-17 MED ORDER — CHLORHEXIDINE GLUCONATE 4 % EX LIQD: 60.0000 mL | Freq: Once | CUTANEOUS | Status: DC

## 2020-05-17 MED ORDER — ORAL CARE MOUTH RINSE: 15.0000 mL | Freq: Once | OROMUCOSAL | Status: AC

## 2020-05-17 MED ORDER — FENTANYL CITRATE (PF) 250 MCG/5ML IJ SOLN
INTRAMUSCULAR | Status: AC
  Filled 2020-05-17: qty 5

## 2020-05-17 MED ORDER — CEFAZOLIN SODIUM-DEXTROSE 2-4 GM/100ML-% IV SOLN
INTRAVENOUS | Status: AC
  Filled 2020-05-17: qty 100

## 2020-05-17 MED ORDER — PROPOFOL 10 MG/ML IV BOLUS
INTRAVENOUS | Status: AC
  Filled 2020-05-17: qty 20

## 2020-05-17 MED ORDER — SODIUM CHLORIDE 0.9 % IV SOLN
INTRAVENOUS | Status: DC | PRN
  Administered 2020-05-17: 500 mL

## 2020-05-17 MED ORDER — FENTANYL CITRATE (PF) 100 MCG/2ML IJ SOLN: 25.0000 ug | INTRAMUSCULAR | Status: DC | PRN

## 2020-05-17 MED ORDER — PROPOFOL 10 MG/ML IV BOLUS
INTRAVENOUS | Status: DC | PRN
  Administered 2020-05-17: 120 mg via INTRAVENOUS

## 2020-05-17 MED ORDER — ACETAMINOPHEN 500 MG PO TABS
1000.0000 mg | ORAL_TABLET | Freq: Once | ORAL | Status: AC
  Administered 2020-05-17: 1000 mg via ORAL
  Filled 2020-05-17: qty 2

## 2020-05-17 MED ORDER — SODIUM CHLORIDE 0.9 % IV SOLN
INTRAVENOUS | Status: AC
  Filled 2020-05-17: qty 1.2

## 2020-05-17 MED ORDER — DEXAMETHASONE SODIUM PHOSPHATE 4 MG/ML IJ SOLN
INTRAMUSCULAR | Status: DC | PRN
  Administered 2020-05-17: 4 mg via INTRAVENOUS

## 2020-05-17 MED ORDER — CEFAZOLIN SODIUM-DEXTROSE 2-4 GM/100ML-% IV SOLN: 2.0000 g | INTRAVENOUS | Status: DC

## 2020-05-17 MED ORDER — CHLORHEXIDINE GLUCONATE 0.12 % MT SOLN: 15.0000 mL | Freq: Once | OROMUCOSAL | Status: AC

## 2020-05-17 MED ORDER — FENTANYL CITRATE (PF) 100 MCG/2ML IJ SOLN
INTRAMUSCULAR | Status: DC | PRN
  Administered 2020-05-17 (×3): 25 ug via INTRAVENOUS
  Administered 2020-05-17 (×2): 50 ug via INTRAVENOUS
  Administered 2020-05-17 (×5): 25 ug via INTRAVENOUS

## 2020-05-17 MED ORDER — HEPARIN SODIUM (PORCINE) 1000 UNIT/ML IJ SOLN
INTRAMUSCULAR | Status: DC | PRN
Start: 2020-05-17 — End: 2020-05-17
  Administered 2020-05-17: 3000 [IU] via INTRAVENOUS

## 2020-05-17 MED ORDER — ONDANSETRON HCL 4 MG/2ML IJ SOLN: 4.0000 mg | Freq: Once | INTRAMUSCULAR | Status: DC | PRN

## 2020-05-17 MED ORDER — LIDOCAINE 2% (20 MG/ML) 5 ML SYRINGE
INTRAMUSCULAR | Status: DC | PRN
  Administered 2020-05-17: 80 mg via INTRAVENOUS

## 2020-05-17 MED ORDER — OXYCODONE-ACETAMINOPHEN 7.5-325 MG PO TABS: 1.0000 | ORAL_TABLET | Freq: Four times a day (QID) | ORAL | 0 refills | Status: DC | PRN

## 2020-05-17 MED ORDER — LIDOCAINE HCL (PF) 1 % IJ SOLN
INTRAMUSCULAR | Status: AC
  Filled 2020-05-17: qty 30

## 2020-05-17 MED ORDER — CHLORHEXIDINE GLUCONATE 0.12 % MT SOLN
OROMUCOSAL | Status: AC
  Administered 2020-05-17: 15 mL via OROMUCOSAL
  Filled 2020-05-17: qty 15

## 2020-05-17 MED ORDER — 0.9 % SODIUM CHLORIDE (POUR BTL) OPTIME
TOPICAL | Status: DC | PRN
  Administered 2020-05-17: 1000 mL

## 2020-05-17 MED ORDER — MIDAZOLAM HCL 2 MG/2ML IJ SOLN
INTRAMUSCULAR | Status: AC
  Filled 2020-05-17: qty 2

## 2020-05-17 SURGICAL SUPPLY — 47 items
ARMBAND PINK RESTRICT EXTREMIT (MISCELLANEOUS) ×3 IMPLANT
CANISTER SUCT 3000ML PPV (MISCELLANEOUS) ×3 IMPLANT
CLIP VESOCCLUDE MED 24/CT (CLIP) ×3 IMPLANT
CLIP VESOCCLUDE SM WIDE 24/CT (CLIP) ×3 IMPLANT
COVER PROBE W GEL 5X96 (DRAPES) ×3 IMPLANT
COVER WAND RF STERILE (DRAPES) IMPLANT
DECANTER SPIKE VIAL GLASS SM (MISCELLANEOUS) ×3 IMPLANT
DERMABOND ADVANCED (GAUZE/BANDAGES/DRESSINGS) ×2
DERMABOND ADVANCED .7 DNX12 (GAUZE/BANDAGES/DRESSINGS) ×1 IMPLANT
ELECT REM PT RETURN 9FT ADLT (ELECTROSURGICAL) ×3
ELECTRODE REM PT RTRN 9FT ADLT (ELECTROSURGICAL) ×1 IMPLANT
GLOVE BIO SURGEON STRL SZ 6.5 (GLOVE) ×4 IMPLANT
GLOVE BIO SURGEON STRL SZ7 (GLOVE) ×3 IMPLANT
GLOVE BIO SURGEON STRL SZ7.5 (GLOVE) ×6 IMPLANT
GLOVE BIO SURGEONS STRL SZ 6.5 (GLOVE) ×2
GLOVE BIOGEL PI IND STRL 6.5 (GLOVE) ×1 IMPLANT
GLOVE BIOGEL PI IND STRL 8 (GLOVE) ×2 IMPLANT
GLOVE BIOGEL PI INDICATOR 6.5 (GLOVE) ×2
GLOVE BIOGEL PI INDICATOR 8 (GLOVE) ×4
GOWN STRL REUS W/ TWL LRG LVL3 (GOWN DISPOSABLE) ×2 IMPLANT
GOWN STRL REUS W/ TWL XL LVL3 (GOWN DISPOSABLE) ×2 IMPLANT
GOWN STRL REUS W/TWL LRG LVL3 (GOWN DISPOSABLE) ×6
GOWN STRL REUS W/TWL XL LVL3 (GOWN DISPOSABLE) ×6
HEMOSTAT SPONGE AVITENE ULTRA (HEMOSTASIS) IMPLANT
KIT BASIN OR (CUSTOM PROCEDURE TRAY) ×3 IMPLANT
KIT TURNOVER KIT B (KITS) ×3 IMPLANT
NEEDLE 22X1 1/2 (OR ONLY) (NEEDLE) ×3 IMPLANT
NEEDLE HYPO 25GX1X1/2 BEV (NEEDLE) ×3 IMPLANT
NS IRRIG 1000ML POUR BTL (IV SOLUTION) ×3 IMPLANT
PACK CV ACCESS (CUSTOM PROCEDURE TRAY) ×3 IMPLANT
PAD ARMBOARD 7.5X6 YLW CONV (MISCELLANEOUS) ×6 IMPLANT
SPONGE LAP 18X18 X RAY DECT (DISPOSABLE) ×3 IMPLANT
SUT MNCRL AB 4-0 PS2 18 (SUTURE) ×9 IMPLANT
SUT PROLENE 5 0 C 1 24 (SUTURE) ×6 IMPLANT
SUT PROLENE 6 0 BV (SUTURE) ×6 IMPLANT
SUT PROLENE 7 0 BV 1 (SUTURE) IMPLANT
SUT SILK 2 0 SH (SUTURE) ×3 IMPLANT
SUT SILK 3 0 (SUTURE) ×3
SUT SILK 3-0 18XBRD TIE 12 (SUTURE) ×1 IMPLANT
SUT VIC AB 2-0 CT1 27 (SUTURE) ×3
SUT VIC AB 2-0 CT1 TAPERPNT 27 (SUTURE) ×1 IMPLANT
SUT VIC AB 3-0 SH 27 (SUTURE) ×9
SUT VIC AB 3-0 SH 27X BRD (SUTURE) ×3 IMPLANT
SYR 20ML LL LF (SYRINGE) ×6 IMPLANT
TOWEL GREEN STERILE (TOWEL DISPOSABLE) ×3 IMPLANT
UNDERPAD 30X36 HEAVY ABSORB (UNDERPADS AND DIAPERS) ×3 IMPLANT
WATER STERILE IRR 1000ML POUR (IV SOLUTION) ×3 IMPLANT

## 2020-05-17 NOTE — Anesthesia Postprocedure Evaluation (Signed)
Anesthesia Post Note  Patient: Devin Harmon  Procedure(s) Performed: LEFT ARM SECOND STAGE BASCILIC FISTULA (Left Arm Upper)     Patient location during evaluation: PACU Anesthesia Type: General Level of consciousness: awake and alert, awake and oriented Pain management: pain level controlled Vital Signs Assessment: post-procedure vital signs reviewed and stable Respiratory status: spontaneous breathing, nonlabored ventilation and respiratory function stable Cardiovascular status: blood pressure returned to baseline and stable Postop Assessment: no apparent nausea or vomiting Anesthetic complications: no   No complications documented.  Last Vitals:  Vitals:   05/17/20 1500 05/17/20 1515  BP: 133/72 129/74  Pulse: (!) 49 (!) 49  Resp: 20 (!) 22  Temp:    SpO2: 94% 94%    Last Pain:  Vitals:   05/17/20 1515  TempSrc:   PainSc: Asleep                 Catalina Gravel

## 2020-05-17 NOTE — Op Note (Signed)
    OPERATIVE NOTE   PROCEDURE: left second stage basilic vein transposition (brachiobasilic arteriovenous fistula) placement  PRE-OPERATIVE DIAGNOSIS: ESRD  POST-OPERATIVE DIAGNOSIS: same  SURGEON: Marty Heck, MD  ASSISTANT(S): Leontine Locket, PA  ANESTHESIA: LMA  ESTIMATED BLOOD LOSS: 150 mL  FINDING(S): Very nice basilic vein that had matured nicely.  This was transposed through three skip incisions.  A new end-to-end anastomosis was performed just above the antecubitum.  Excellent thrill upon completion.  Palpable radial pulse.  SPECIMEN(S):  None  INDICATIONS:   Devin Harmon is a 64 y.o. male who presents with ESRD and need for permanent hemodialysis access.  The patient is scheduled for left second stage basilic vein transposition.  The patient is aware the risks include but are not limited to: bleeding, infection, steal syndrome, nerve damage, ischemic monomelic neuropathy, failure to mature, and need for additional procedures.  The patient is aware of the risks of the procedure and elects to proceed forward.   DESCRIPTION: After full informed written consent was obtained from the patient, the patient was brought back to the operating room and placed supine upon the operating table.  Prior to induction, the patient received IV antibiotics.   After obtaining adequate anesthesia, the patient was then prepped and draped in the standard fashion for a left arm access procedure.  I turned my attention first to identifying the patient's brachiobasilic arteriovenous fistula.  Using SonoSite guidance, the location of this fistula was marked out on the skin.    This was an excellent caliber vein.  I made three longitudinal incisions on the medial aspect of the left upper arm.  Through these incisions I dissected out circumferentially the basilic vein, taking care to protect the nerve.  Once the vein was fully mobilized, all side branches were ligated between silk ties and  divided.  The vein was marked for orientation.  I then used a curved tunneler to create a subcutaneous tunnel.  The patient was given 3000 units IV heparin.  The vein was then transected near the antecubital crease.  It was then brought to the previously created tunnel making sure to maintain proper orientation.  A primary anastomosis was then performed between the two cut ends of the vein with a running 5-0 Prolene.  Once this was done the clamps were released.  There was excellent flow through the fistula.  Hemostasis was then achieved.  The wound was irrigated.  The incision was closed with a deep layer of 3-0 Vicryl followed by a subcutaneous 4-0 Monocryl and Dermabond.  There were no immediate complications.  COMPLICATIONS: None  CONDITION: Stable  Marty Heck, MD [facility]: [phone]  05/17/2020, 1:59 PM

## 2020-05-17 NOTE — Anesthesia Procedure Notes (Signed)
Procedure Name: LMA Insertion Date/Time: 05/17/2020 12:21 PM Performed by: Amadeo Garnet, CRNA Pre-anesthesia Checklist: Patient identified, Emergency Drugs available, Suction available and Patient being monitored Patient Re-evaluated:Patient Re-evaluated prior to induction Oxygen Delivery Method: Circle system utilized Preoxygenation: Pre-oxygenation with 100% oxygen Induction Type: IV induction LMA: LMA inserted LMA Size: 5.0 Number of attempts: 1 Placement Confirmation: positive ETCO2 and breath sounds checked- equal and bilateral Tube secured with: Tape Dental Injury: Teeth and Oropharynx as per pre-operative assessment

## 2020-05-17 NOTE — Transfer of Care (Signed)
Immediate Anesthesia Transfer of Care Note  Patient: Devin Harmon  Procedure(s) Performed: LEFT ARM SECOND STAGE BASCILIC FISTULA (Left Arm Upper)  Patient Location: PACU  Anesthesia Type:General  Level of Consciousness: awake, alert  and oriented  Airway & Oxygen Therapy: Patient Spontanous Breathing and Patient connected to face mask oxygen  Post-op Assessment: Report given to RN, Post -op Vital signs reviewed and stable and Patient moving all extremities  Post vital signs: Reviewed and stable  Last Vitals:  Vitals Value Taken Time  BP 131/77 05/17/20 1418  Temp    Pulse 47 05/17/20 1423  Resp 16 05/17/20 1423  SpO2 97 % 05/17/20 1423  Vitals shown include unvalidated device data.  Last Pain:  Vitals:   05/17/20 0936  TempSrc:   PainSc: 0-No pain         Complications: No complications documented.

## 2020-05-17 NOTE — Discharge Instructions (Signed)
[facility]  Discharge Instructions  AV Fistula or Graft Surgery for Dialysis Access  Please refer to the following instructions for your post-procedure care. Your surgeon or physician assistant will discuss any changes with you.  Activity  You may drive the day following your surgery, if you are comfortable and no longer taking prescription pain medication. Resume full activity as the soreness in your incision resolves.  Bathing/Showering  You may shower after you go home. Keep your incision dry for 48 hours. Do not soak in a bathtub, hot tub, or swim until the incision heals completely. You may not shower if you have a hemodialysis catheter.  Incision Care  Clean your incision with mild soap and water after 48 hours. Pat the area dry with a clean towel. You do not need a bandage unless otherwise instructed. Do not apply any ointments or creams to your incision. You may have skin glue on your incision. Do not peel it off. It will come off on its own in about one week. Your arm may swell a bit after surgery. To reduce swelling use pillows to elevate your arm so it is above your heart. Your doctor will tell you if you need to lightly wrap your arm with an ACE bandage.  Leave ACE bandage on for 48 hours then remove.  Diet  Resume your normal diet. There are not special food restrictions following this procedure. In order to heal from your surgery, it is CRITICAL to get adequate nutrition. Your body requires vitamins, minerals, and protein. Vegetables are the best source of vitamins and minerals. Vegetables also provide the perfect balance of protein. Processed food has little nutritional value, so try to avoid this.  Medications  Resume taking all of your medications. If your incision is causing pain, you may take over-the counter pain relievers such as acetaminophen (Tylenol). If you were prescribed a stronger pain medication, please be aware these  medications can cause nausea and constipation. Prevent nausea by taking the medication with a snack or meal. Avoid constipation by drinking plenty of fluids and eating foods with high amount of fiber, such as fruits, vegetables, and grains.  Do not take Tylenol if you are taking prescription pain medications.  Follow up Your surgeon may want to see you in the office following your access surgery. If so, this will be arranged at the time of your surgery.  Please call us immediately for any of the following conditions:  . Increased pain, redness, drainage (pus) from your incision site . Fever of 101 degrees or higher . Severe or worsening pain at your incision site . Hand pain or numbness. .  Reduce your risk of vascular disease:  . Stop smoking. If you would like help, call [facility] at 1-800-QUIT-NOW 289-492-8924) or [place] at ([phone] [phone]  . Manage your cholesterol . Maintain a desired weight . Control your diabetes . Keep your blood pressure down  Dialysis  It will take several weeks to several months for your new dialysis access to be ready for use. Your surgeon will determine when it is okay to use it. Your nephrologist will continue to direct your dialysis. You can continue to use your Permcath until your new access is ready for use.   05/17/2020 Devin Harmon [id number] 1956/04/13  Surgeon(s): Marty Heck, MD  Procedure(s): LEFT ARM SECOND STAGE BASILIC FISTULA  x Do not stick fistula for 6 weeks    If you have any questions, please call  the office at [phone].

## 2020-05-17 NOTE — H&P (Signed)
History and Physical Interval Note:  05/17/2020 9:39 AM  Devin Harmon  has presented today for surgery, with the diagnosis of END STAGE RENAL DISEASE.  The various methods of treatment have been discussed with the patient and family. After consideration of risks, benefits and other options for treatment, the patient has consented to  Procedure(s): LEFT SECOND STAGE [place] (Left) as a surgical intervention.  The patient's history has been reviewed, patient examined, no change in status, stable for surgery.  I have reviewed the patient's chart and labs.  Questions were answered to the patient's satisfaction.    Left 2nd stage basilic vein fistula.    Marty Heck  Patient name: Devin Harmon          MRN: [id number]        DOB: 09/23/1956            Sex: male  REASON FOR VISIT: Ongoing follow-up for left arm swelling after left first stage basilic vein transposition  HPI: Devin Harmon is a 64 y.o. male with end-stage renal disease that presents for ongoing follow-up of left arm swelling after first stage basilic vein transposition on 02/09/2020.  Ultimately we took him for left arm fistulogram on 03/15/2020 and performed a central venoplasty of the subclavian vein with a 9 x 60 and 12 x 60 Mustang.  On follow-up today he and his wife feel like the left arm swelling is significantly improved.  His incision is now completely healed.  Still has a good thrill.  Currently doing dialysis on Monday Wednesday Friday through left IJ tunnel catheter.     Past Medical History:  Diagnosis Date  . Allergy   . Anemia   . BPH (benign prostatic hypertrophy)   . ESRD (end stage renal disease) ([place])    MWF - [place]  . Hypertension   . Leg weakness    right  . Slurred speech   . [place] 2007   facial, hospital 2007         Past Surgical History:  Procedure Laterality Date  . A/V FISTULAGRAM N/A 12/19/2019   Procedure: A/V FISTULAGRAM - Right Arm;  Surgeon: Waynetta Sandy, MD;  Location: [facility];  Service: Cardiovascular;  Laterality: N/A;  . A/V FISTULAGRAM Left 03/15/2020   Procedure: A/V FISTULAGRAM;  Surgeon: Marty Heck, MD;  Location: [facility];  Service: Cardiovascular;  Laterality: Left;  . AV FISTULA PLACEMENT Right 09/01/2019   Procedure: ARTERIOVENOUS (AV) FISTULA CREATION RIGHT ARM;  Surgeon: Marty Heck, MD;  Location: [facility] OR;  Service: Vascular;  Laterality: Right;  . AV FISTULA PLACEMENT Left 02/09/2020   Procedure: LEFT ARM BASILIC VEIN Arteriovenous FISTULA  CREATION;  Surgeon: Marty Heck, MD;  Location: [facility];  Service: Vascular;  Laterality: Left;  . DIALYSIS/PERMA CATHETER INSERTION N/A 04/15/2019   Procedure: DIALYSIS/PERMA CATHETER INSERTION;  Surgeon: Katha Cabal, MD;  Location: [facility];  Service: Cardiovascular;  Laterality: N/A;  . INSERTION OF DIALYSIS CATHETER Left 11/15/2019   Procedure: INSERTION OF LEFT TUNNEL DIALYSIS CATHETER;  Surgeon: Waynetta Sandy, MD;  Location: [place];  Service: Vascular;  Laterality: Left;  . LIGATION OF ARTERIOVENOUS  FISTULA Right 01/03/2020   Procedure: LIGATION OF ARTERIOVENOUS  FISTULA;  Surgeon: Serafina Mitchell, MD;  Location: [place];  Service: Vascular;  Laterality: Right;  . PERIPHERAL VASCULAR BALLOON ANGIOPLASTY Left 03/15/2020   Procedure: PERIPHERAL VASCULAR BALLOON ANGIOPLASTY;  Surgeon: Monica Martinez  J, MD;  Location: [place] [facility];  Service: Cardiovascular;  Laterality: Left;  UPPER ARM  . PERIPHERAL VASCULAR INTERVENTION Right 12/19/2019   Procedure: PERIPHERAL VASCULAR INTERVENTION;  Surgeon: Waynetta Sandy, MD;  Location: [facility] [facility];  Service: Cardiovascular;  Laterality: Right;  . REMOVAL OF A DIALYSIS CATHETER Right 11/15/2019   Procedure: REMOVAL OF A RIGHT TUNNEL DIALYSIS CATHETER;  Surgeon: Waynetta Sandy, MD;  Location: [facility];  Service: Vascular;   Laterality: Right;         Family History  Problem Relation Age of Onset  . Hypertension Mother   . Cancer Mother        lung cancer  . Hypertension Brother   . Heart disease Father   . Coronary artery disease Neg Hx   . Diabetes Neg Hx     SOCIAL HISTORY: Social History        Tobacco Use  . Smoking status: Never Smoker  . Smokeless tobacco: Never Used  Substance Use Topics  . Alcohol use: Not Currently    Comment: occasional but none since 2019         Allergies  Allergen Reactions  . Sulfa Antibiotics Hives  . Sulfonamide Derivatives Hives  . Chlorthalidone Other (See Comments)    felt "bad" and ED   . Hydrocodone Nausea Only    dizzy          Current Outpatient Medications  Medication Sig Dispense Refill  . acetaminophen (TYLENOL) 500 MG tablet Take 1,000 mg by mouth every 6 (six) hours as needed for mild pain.     Marland Kitchen amLODipine (NORVASC) 10 MG tablet TAKE 1 TABLET BY MOUTH EVERY DAY (Patient taking differently: Take 10 mg by mouth daily. ) 90 tablet 3  . AURYXIA 1 GM 210 MG(Fe) tablet Take 420 mg by mouth 3 (three) times daily with meals.     . B Complex-C-Folic Acid (RENA-VITE RX) 1 MG TABS Take 1 mg by mouth daily.    . cetirizine (ZYRTEC) 10 MG tablet Take 10 mg by mouth daily as needed for allergies.     . fluticasone (FLONASE) 50 MCG/ACT nasal spray Place 1-2 sprays into both nostrils daily as needed for allergies.     . hydrocortisone cream 1 % Apply 1 application topically daily as needed (rash).    . labetalol (NORMODYNE) 100 MG tablet TAKE 1 TABLET BY MOUTH TWICE A DAY (Patient taking differently: Take 100 mg by mouth 2 (two) times daily. ) 180 tablet 3  . neomycin-bacitracin-polymyxin (NEOSPORIN) ointment Apply 1 application topically as needed for wound care.    . traMADol (ULTRAM) 50 MG tablet Take 1 tablet (50 mg total) by mouth every 6 (six) hours as needed. 15 tablet 0  . oxyCODONE-acetaminophen (PERCOCET)  7.5-325 MG tablet Take 1 tablet by mouth every 4 (four) hours as needed for severe pain. 6 tablet 0   No current facility-administered medications for this visit.    REVIEW OF SYSTEMS:  [X]  denotes positive finding, [ ]  denotes negative finding Cardiac  Comments:  Chest pain or chest pressure:    Shortness of breath upon exertion:    Short of breath when lying flat:    Irregular heart rhythm:        Vascular    Pain in calf, thigh, or hip brought on by ambulation:    Pain in feet at night that wakes you up from your sleep:     Blood clot in your veins:  Leg swelling:         Pulmonary    Oxygen at home:    Productive cough:     Wheezing:         Neurologic    Sudden weakness in arms or legs:     Sudden numbness in arms or legs:     Sudden onset of difficulty speaking or slurred speech:    Temporary loss of vision in one eye:     Problems with dizziness:         Gastrointestinal    Blood in stool:     Vomited blood:         Genitourinary    Burning when urinating:     Blood in urine:        Psychiatric    Major depression:         Hematologic    Bleeding problems:    Problems with blood clotting too easily:        Skin    Rashes or ulcers:        Constitutional    Fever or chills:      PHYSICAL EXAM:    Vitals:   05/01/20 0850  BP: (!) 146/75  Pulse: (!) 55  Resp: 16  Temp: (!) 97.5 F (36.4 C)  TempSrc: Temporal  Weight: 205 lb (93 kg)  Height: 5\' 9"  (1.753 m)    GENERAL: The patient is a well-nourished male, in no acute distress. The vital signs are documented above. CARDIAC: There is a regular rate and rhythm.  VASCULAR:  Left brachiobasilic fistula with good thrill Left arm swelling significantly improved Left arm incision well healed  DATA:   None  Assessment/Plan:  64 year old male presents for ongoing evaluation of left arm  swelling after left first stage basilic vein transposition on 02/09/20.  Since undergoing fistulogram with central subclavian vein venoplasty his left arm swelling now significantly improved.  I reviewed the pictures and again the basilic vein looks like it has matured nicely.  He has a good thrill on exam.  Subsequently recommended proceeding with a left second stage basilic vein transposition.  Risk and benefits were discussed in detail.  Ultimately family would like to proceed and we will schedule for 05/17/20 on a nondialysis day.  Certainly discussed that this subclavian vein stenosis may be a problem again in the future and likely will require future interventions.   Marty Heck, MD [facility] Office: [phone]

## 2020-05-23 DIAGNOSIS — E875 Hyperkalemia: Secondary | ICD-10-CM | POA: Insufficient documentation

## 2020-06-12 ENCOUNTER — Other Ambulatory Visit: Payer: Self-pay

## 2020-06-12 ENCOUNTER — Ambulatory Visit (INDEPENDENT_AMBULATORY_CARE_PROVIDER_SITE_OTHER): Payer: Self-pay | Admitting: Physician Assistant

## 2020-06-12 VITALS — BP 151/82 | HR 61 | Temp 97.5°F | Resp 20 | Ht 69.0 in | Wt 208.7 lb

## 2020-06-12 DIAGNOSIS — N186 End stage renal disease: Secondary | ICD-10-CM

## 2020-06-12 DIAGNOSIS — Z992 Dependence on renal dialysis: Secondary | ICD-10-CM

## 2020-06-12 NOTE — Progress Notes (Signed)
    Postoperative Access Visit   History of Present Illness   Devin Harmon is a 64 y.o. year old male who presents for postoperative follow-up for: left second stage basilic vein transposition by Dr. Carlis Abbott on 05/17/20. The patient's wounds are healed.  The patient notes no steal symptoms.  The patient is  able to complete their activities of daily living. He does have some mild left upper extremity swelling. He states that he has been elevating his left arm which has helped some with this and it is much improved from the swelling he had previously. Has hx of left arm fistulogram on 03/15/20 by Dr. Carlis Abbott due to left upper extremity swelling following his 1st stage left BV fistula that was placed 02/09/20. Dr. Carlis Abbott performed a central venoplasty of the subclavian vein with a 9 x 60 and 12 x 60 Mustang. This improved his left upper extremity swelling prior to his recent second stage transposition  Currently doing dialysis on Monday Wednesday Friday through left IJ tunnel catheter that we placed in December of 2020. He goes to the [facility]  Physical Examination   Vitals:   06/12/20 0932  BP: (!) 151/82  Pulse: 61  Resp: 20  Temp: (!) 97.5 F (36.4 C)  TempSrc: Temporal  SpO2: 96%  Weight: 208 lb 11.2 oz (94.7 kg)  Height: 5\' 9"  (1.753 m)   Body mass index is 30.82 kg/m.  left arm Incision is well healed, 2+ radial pulse, hand grip is 5/5, sensation in digits is intact, palpable thrill, bruit can be auscultated. Left hand warm. Motor and sensation intact    Medical Decision Making    Devin Harmon is a 64 y.o. year old male who presents s/p left second stage basilic vein transposition by Dr. Carlis Abbott on 05/17/20. Left upper arm incisions are well healed.   Patent is without signs or symptoms of steal syndrome  Recommend continued elevation of the left arm prn  The patient's access will be ready for use after 06/16/20  The patient's tunneled dialysis catheter  can be removed when Nephrology is comfortable with the performance of the left basilic vein fistula. They can contact us to have this removed  The patient may follow up on a prn basis   Karoline Caldwell, PA-C [facility] Office: [facility] MD: Carlis Abbott

## 2020-06-26 ENCOUNTER — Emergency Department (HOSPITAL_COMMUNITY): Payer: 59

## 2020-06-26 ENCOUNTER — Inpatient Hospital Stay (HOSPITAL_COMMUNITY)
Admission: EM | Admit: 2020-06-26 | Discharge: 2020-07-04 | DRG: 291 | Disposition: A | Discharge status: Home Health | Payer: 59 | Attending: Family Medicine | Admitting: Family Medicine

## 2020-06-26 ENCOUNTER — Inpatient Hospital Stay (HOSPITAL_COMMUNITY): Payer: 59

## 2020-06-26 ENCOUNTER — Other Ambulatory Visit: Payer: Self-pay

## 2020-06-26 ENCOUNTER — Encounter (HOSPITAL_COMMUNITY): Payer: Self-pay | Admitting: Emergency Medicine

## 2020-06-26 DIAGNOSIS — I10 Essential (primary) hypertension: Secondary | ICD-10-CM | POA: Diagnosis not present

## 2020-06-26 DIAGNOSIS — J9600 Acute respiratory failure, unspecified whether with hypoxia or hypercapnia: Secondary | ICD-10-CM | POA: Diagnosis not present

## 2020-06-26 DIAGNOSIS — N400 Enlarged prostate without lower urinary tract symptoms: Secondary | ICD-10-CM | POA: Diagnosis present

## 2020-06-26 DIAGNOSIS — J918 Pleural effusion in other conditions classified elsewhere: Secondary | ICD-10-CM | POA: Diagnosis present

## 2020-06-26 DIAGNOSIS — I5031 Acute diastolic (congestive) heart failure: Secondary | ICD-10-CM | POA: Diagnosis not present

## 2020-06-26 DIAGNOSIS — E8770 Fluid overload, unspecified: Secondary | ICD-10-CM | POA: Diagnosis not present

## 2020-06-26 DIAGNOSIS — Z9889 Other specified postprocedural states: Secondary | ICD-10-CM

## 2020-06-26 DIAGNOSIS — I132 Hypertensive heart and chronic kidney disease with heart failure and with stage 5 chronic kidney disease, or end stage renal disease: Secondary | ICD-10-CM | POA: Diagnosis present

## 2020-06-26 DIAGNOSIS — Z992 Dependence on renal dialysis: Secondary | ICD-10-CM | POA: Diagnosis not present

## 2020-06-26 DIAGNOSIS — I351 Nonrheumatic aortic (valve) insufficiency: Secondary | ICD-10-CM | POA: Diagnosis present

## 2020-06-26 DIAGNOSIS — Z8249 Family history of ischemic heart disease and other diseases of the circulatory system: Secondary | ICD-10-CM

## 2020-06-26 DIAGNOSIS — I69322 Dysarthria following cerebral infarction: Secondary | ICD-10-CM | POA: Diagnosis not present

## 2020-06-26 DIAGNOSIS — I69351 Hemiplegia and hemiparesis following cerebral infarction affecting right dominant side: Secondary | ICD-10-CM

## 2020-06-26 DIAGNOSIS — M898X9 Other specified disorders of bone, unspecified site: Secondary | ICD-10-CM | POA: Diagnosis present

## 2020-06-26 DIAGNOSIS — I5023 Acute on chronic systolic (congestive) heart failure: Secondary | ICD-10-CM | POA: Diagnosis not present

## 2020-06-26 DIAGNOSIS — Z9115 Patient's noncompliance with renal dialysis: Secondary | ICD-10-CM | POA: Diagnosis not present

## 2020-06-26 DIAGNOSIS — Z801 Family history of malignant neoplasm of trachea, bronchus and lung: Secondary | ICD-10-CM

## 2020-06-26 DIAGNOSIS — J9601 Acute respiratory failure with hypoxia: Secondary | ICD-10-CM | POA: Diagnosis present

## 2020-06-26 DIAGNOSIS — N2581 Secondary hyperparathyroidism of renal origin: Secondary | ICD-10-CM | POA: Diagnosis present

## 2020-06-26 DIAGNOSIS — J95812 Postprocedural air leak: Secondary | ICD-10-CM | POA: Diagnosis not present

## 2020-06-26 DIAGNOSIS — Z20822 Contact with and (suspected) exposure to covid-19: Secondary | ICD-10-CM | POA: Diagnosis present

## 2020-06-26 DIAGNOSIS — J939 Pneumothorax, unspecified: Secondary | ICD-10-CM

## 2020-06-26 DIAGNOSIS — J90 Pleural effusion, not elsewhere classified: Secondary | ICD-10-CM

## 2020-06-26 DIAGNOSIS — N186 End stage renal disease: Secondary | ICD-10-CM | POA: Diagnosis present

## 2020-06-26 DIAGNOSIS — I493 Ventricular premature depolarization: Secondary | ICD-10-CM | POA: Diagnosis present

## 2020-06-26 DIAGNOSIS — J9383 Other pneumothorax: Secondary | ICD-10-CM | POA: Diagnosis not present

## 2020-06-26 DIAGNOSIS — Z882 Allergy status to sulfonamides status: Secondary | ICD-10-CM

## 2020-06-26 DIAGNOSIS — D631 Anemia in chronic kidney disease: Secondary | ICD-10-CM | POA: Diagnosis present

## 2020-06-26 DIAGNOSIS — Z9689 Presence of other specified functional implants: Secondary | ICD-10-CM

## 2020-06-26 DIAGNOSIS — R0600 Dyspnea, unspecified: Secondary | ICD-10-CM

## 2020-06-26 DIAGNOSIS — Z4682 Encounter for fitting and adjustment of non-vascular catheter: Secondary | ICD-10-CM

## 2020-06-26 LAB — LACTATE DEHYDROGENASE, PLEURAL OR PERITONEAL FLUID: LD, Fluid: 97 U/L — ABNORMAL HIGH (ref 3–23)

## 2020-06-26 LAB — GLUCOSE, PLEURAL OR PERITONEAL FLUID: Glucose, Fluid: 93 mg/dL

## 2020-06-26 LAB — CBC
HCT: 36.1 % — ABNORMAL LOW (ref 39.0–52.0)
HCT: 35.4 % — ABNORMAL LOW (ref 39.0–52.0)
Hemoglobin: 11.1 g/dL — ABNORMAL LOW (ref 13.0–17.0)
Hemoglobin: 11.4 g/dL — ABNORMAL LOW (ref 13.0–17.0)
MCH: 28.2 pg (ref 26.0–34.0)
MCH: 29.4 pg (ref 26.0–34.0)
MCHC: 30.7 g/dL (ref 30.0–36.0)
MCHC: 32.2 g/dL (ref 30.0–36.0)
MCV: 91.9 fL (ref 80.0–100.0)
MCV: 91.2 fL (ref 80.0–100.0)
Platelets: 241 10*3/uL (ref 150–400)
Platelets: 194 10*3/uL (ref 150–400)
RBC: 3.93 MIL/uL — ABNORMAL LOW (ref 4.22–5.81)
RBC: 3.88 MIL/uL — ABNORMAL LOW (ref 4.22–5.81)
RDW: 19.4 % — ABNORMAL HIGH (ref 11.5–15.5)
RDW: 19.4 % — ABNORMAL HIGH (ref 11.5–15.5)
WBC: 8.7 10*3/uL (ref 4.0–10.5)
WBC: 10.9 10*3/uL — ABNORMAL HIGH (ref 4.0–10.5)
nRBC: 0.2 % (ref 0.0–0.2)
nRBC: 0.3 % — ABNORMAL HIGH (ref 0.0–0.2)

## 2020-06-26 LAB — CREATININE, SERUM
Creatinine, Ser: 6.79 mg/dL — ABNORMAL HIGH (ref 0.61–1.24)
GFR calc Af Amer: 9 mL/min — ABNORMAL LOW (ref >60)
GFR calc non Af Amer: 8 mL/min — ABNORMAL LOW (ref >60)

## 2020-06-26 LAB — PROTEIN, PLEURAL OR PERITONEAL FLUID: Total protein, fluid: 3.6 g/dL

## 2020-06-26 LAB — BODY FLUID CELL COUNT WITH DIFFERENTIAL
Eos, Fluid: 1 %
Lymphs, Fluid: 46 %
Monocyte-Macrophage-Serous Fluid: 40 % — ABNORMAL LOW (ref 50–90)
Neutrophil Count, Fluid: 6 % (ref 0–25)
Other Cells, Fluid: 1 %
Total Nucleated Cell Count, Fluid: 56 cu mm (ref 0–1000)

## 2020-06-26 LAB — BASIC METABOLIC PANEL
Anion gap: 19 — ABNORMAL HIGH (ref 5–15)
BUN: 56 mg/dL — ABNORMAL HIGH (ref 8–23)
CO2: 21 mmol/L — ABNORMAL LOW (ref 22–32)
Calcium: 8.4 mg/dL — ABNORMAL LOW (ref 8.9–10.3)
Chloride: 98 mmol/L (ref 98–111)
Creatinine, Ser: 10.14 mg/dL — ABNORMAL HIGH (ref 0.61–1.24)
GFR calc Af Amer: 6 mL/min — ABNORMAL LOW (ref >60)
GFR calc non Af Amer: 5 mL/min — ABNORMAL LOW (ref >60)
Glucose, Bld: 97 mg/dL (ref 70–99)
Potassium: 5 mmol/L (ref 3.5–5.1)
Sodium: 138 mmol/L (ref 135–145)

## 2020-06-26 LAB — HIV ANTIBODY (ROUTINE TESTING W REFLEX): HIV Screen 4th Generation wRfx: NONREACTIVE

## 2020-06-26 LAB — TROPONIN I (HIGH SENSITIVITY)
Troponin I (High Sensitivity): 54 ng/L — ABNORMAL HIGH (ref <18)
Troponin I (High Sensitivity): 54 ng/L — ABNORMAL HIGH (ref <18)

## 2020-06-26 LAB — HEPATIC FUNCTION PANEL
ALT: 19 U/L (ref 0–44)
AST: 25 U/L (ref 15–41)
Albumin: 4 g/dL (ref 3.5–5.0)
Alkaline Phosphatase: 69 U/L (ref 38–126)
Bilirubin, Direct: 0.3 mg/dL — ABNORMAL HIGH (ref 0.0–0.2)
Indirect Bilirubin: 0.9 mg/dL (ref 0.3–0.9)
Total Bilirubin: 1.2 mg/dL (ref 0.3–1.2)
Total Protein: 7.4 g/dL (ref 6.5–8.1)

## 2020-06-26 LAB — BRAIN NATRIURETIC PEPTIDE: B Natriuretic Peptide: >4500 pg/mL — ABNORMAL HIGH (ref 0.0–100.0)

## 2020-06-26 LAB — POC SARS CORONAVIRUS 2 AG -  ED: SARS Coronavirus 2 Ag: NEGATIVE

## 2020-06-26 LAB — HEPATITIS B SURFACE ANTIGEN: Hepatitis B Surface Ag: NONREACTIVE

## 2020-06-26 LAB — SARS CORONAVIRUS 2 BY RT PCR (DIASORIN): SARS Coronavirus 2: NEGATIVE

## 2020-06-26 LAB — LACTIC ACID, PLASMA
Lactic Acid, Venous: 1.5 mmol/L (ref 0.5–1.9)
Lactic Acid, Venous: 1.3 mmol/L (ref 0.5–1.9)

## 2020-06-26 LAB — LACTATE DEHYDROGENASE: LDH: 215 U/L — ABNORMAL HIGH (ref 98–192)

## 2020-06-26 MED ORDER — CHLORHEXIDINE GLUCONATE CLOTH 2 % EX PADS
6.0000 | MEDICATED_PAD | Freq: Every day | CUTANEOUS | Status: DC
  Administered 2020-06-28 – 2020-07-01 (×4): 6 via TOPICAL

## 2020-06-26 MED ORDER — ONDANSETRON HCL 4 MG PO TABS: 4.0000 mg | ORAL_TABLET | Freq: Four times a day (QID) | ORAL | Status: DC | PRN

## 2020-06-26 MED ORDER — FLUTICASONE PROPIONATE 50 MCG/ACT NA SUSP
1.0000 | Freq: Every day | NASAL | Status: DC | PRN
  Filled 2020-06-26: qty 16

## 2020-06-26 MED ORDER — AMLODIPINE BESYLATE 10 MG PO TABS
10.0000 mg | ORAL_TABLET | Freq: Every day | ORAL | Status: DC
  Administered 2020-06-27 – 2020-07-04 (×6): 10 mg via ORAL
  Filled 2020-06-26 (×6): qty 1

## 2020-06-26 MED ORDER — SODIUM CHLORIDE 0.9% FLUSH: 3.0000 mL | Freq: Once | INTRAVENOUS | Status: DC

## 2020-06-26 MED ORDER — ACETAMINOPHEN 500 MG PO TABS
1000.0000 mg | ORAL_TABLET | Freq: Four times a day (QID) | ORAL | Status: DC | PRN
  Filled 2020-06-26: qty 2

## 2020-06-26 MED ORDER — ALTEPLASE 2 MG IJ SOLR
INTRAMUSCULAR | Status: AC
  Administered 2020-06-26: 4 mg
  Filled 2020-06-26: qty 4

## 2020-06-26 MED ORDER — RENA-VITE PO TABS
1.0000 | ORAL_TABLET | Freq: Every day | ORAL | Status: DC
  Administered 2020-06-27 – 2020-07-04 (×6): 1 via ORAL
  Filled 2020-06-26 (×6): qty 1

## 2020-06-26 MED ORDER — HYDRALAZINE HCL 20 MG/ML IJ SOLN
10.0000 mg | Freq: Four times a day (QID) | INTRAMUSCULAR | Status: DC | PRN
  Administered 2020-06-26: 10 mg via INTRAVENOUS
  Filled 2020-06-26: qty 1

## 2020-06-26 MED ORDER — ONDANSETRON HCL 4 MG/2ML IJ SOLN: 4.0000 mg | Freq: Four times a day (QID) | INTRAMUSCULAR | Status: DC | PRN

## 2020-06-26 MED ORDER — FERRIC CITRATE 1 GM 210 MG(FE) PO TABS
420.0000 mg | ORAL_TABLET | Freq: Three times a day (TID) | ORAL | Status: DC
  Administered 2020-06-28 – 2020-07-04 (×14): 420 mg via ORAL
  Filled 2020-06-26 (×14): qty 2

## 2020-06-26 MED ORDER — LORAZEPAM 2 MG/ML IJ SOLN
1.0000 mg | Freq: Once | INTRAMUSCULAR | Status: AC
  Administered 2020-06-26: 1 mg via INTRAVENOUS
  Filled 2020-06-26: qty 1

## 2020-06-26 MED ORDER — ALTEPLASE 2 MG IJ SOLR: 4.0000 mg | Freq: Once | INTRAMUSCULAR | Status: AC

## 2020-06-26 MED ORDER — LABETALOL HCL 100 MG PO TABS
100.0000 mg | ORAL_TABLET | Freq: Two times a day (BID) | ORAL | Status: DC
  Administered 2020-06-27 – 2020-07-01 (×8): 100 mg via ORAL
  Filled 2020-06-26 (×9): qty 1

## 2020-06-26 MED ORDER — ALBUTEROL SULFATE (2.5 MG/3ML) 0.083% IN NEBU: 2.5000 mg | INHALATION_SOLUTION | RESPIRATORY_TRACT | Status: DC | PRN

## 2020-06-26 MED ORDER — POLYETHYLENE GLYCOL 3350 17 G PO PACK: 17.0000 g | PACK | Freq: Every day | ORAL | Status: DC | PRN

## 2020-06-26 MED ORDER — SODIUM CHLORIDE 0.9 % IV SOLN
1.0000 g | Freq: Once | INTRAVENOUS | Status: AC
  Administered 2020-06-26: 1 g via INTRAVENOUS
  Filled 2020-06-26: qty 10

## 2020-06-26 MED ORDER — CHLORHEXIDINE GLUCONATE CLOTH 2 % EX PADS: 6.0000 | MEDICATED_PAD | Freq: Every day | CUTANEOUS | Status: DC

## 2020-06-26 MED ORDER — SODIUM CHLORIDE 0.9 % IV SOLN
500.0000 mg | Freq: Once | INTRAVENOUS | Status: DC
  Administered 2020-06-26: 500 mg via INTRAVENOUS
  Filled 2020-06-26 (×2): qty 500

## 2020-06-26 MED ORDER — LORATADINE 10 MG PO TABS: 10.0000 mg | ORAL_TABLET | Freq: Every day | ORAL | Status: DC | PRN

## 2020-06-26 MED ORDER — HEPARIN SODIUM (PORCINE) 5000 UNIT/ML IJ SOLN
5000.0000 [IU] | Freq: Three times a day (TID) | INTRAMUSCULAR | Status: DC
  Administered 2020-06-26 – 2020-07-04 (×22): 5000 [IU] via SUBCUTANEOUS
  Filled 2020-06-26 (×22): qty 1

## 2020-06-26 NOTE — ED Notes (Signed)
Patient transferred from ED to dialysis

## 2020-06-26 NOTE — Progress Notes (Signed)
ABG had to be run on ISTAT due too small blood sample.  ABG results: PH 7.43, CO2 36.6, PAO2 58, HCO3 24.5, SO2 90.

## 2020-06-26 NOTE — Significant Event (Addendum)
Rapid Response Event Note  Overview: Respiratory distress, increased WOB  Initial Focused Assessment: Notified by nursing staff pt became very anxious and tachypneic after being laid down in the bed. Upon my arrival, Mr. Dalby is in significant distress with RR 40s and abdominal accessory muscle use. Pt endorses CP and SOB. He is oriented to self but has impaired judgment trying to get OOB and pull off his NRB mask. Reportedly pt is intolerant to BIPAP. He has been placed on Sal[place] HFNC since he will not keep a mask on. HD earlier today but was incomplete due to catheter issues. Skin warm, pink and diaporetic. BBS diminished significantly on the right with rales on the left. Abdomen is soft and distended. TRH and PCCM notified by nursing staff.  2045-99.63F, HR  , 194/90, RR 38 with sats 92% on Sal[place] HFNC at 15L.  TRH and PCCM came to bedside. Plan for emergent HD tonight.   Interventions: -Salter HFNC  -ABG  Plan of Care (if not transferred): -Maintain HOB greater than 30 degrees -Notify primary svc and/or RRRN for further assistance  Event Summary: Call received 2026 Arrived at call 2030 Call ended [phone]  Madelynn Done

## 2020-06-26 NOTE — Progress Notes (Signed)
Was called by Dr. Tonie Griffith -patient with worsening respiratory distress this evening.  Completed hemodialysis treatment at 1700 today with 2300 of fluid removed.  Was also noted to undergo thoracentesis today also removing 2300 of fluid.    Repeat chest x-ray does not show pneumothorax.  It shows reaccumulation of pleural effusion.  Patient initially having difficulty tolerating BiPAP-given Ativan-last several oxygen saturations of 100%- is more calm. Rapid response and CCM have evaluated pt and feel that he needs supplemental HD  The evening dialysis nurse is currently dialyzing 2 pts and will be done around midnight.  It seems that this patient may be too unstable to perform dialysis in the HD unit with only one nurse present.  So, will plan on him being run as a separate in the room on 4E in early AM as soon as we can get to him    Louis Meckel

## 2020-06-26 NOTE — ED Provider Notes (Addendum)
[facility] EMERGENCY DEPARTMENT Provider Note   CSN: [id number] Arrival date & time: 06/26/20  0177     History Chief Complaint  Patient presents with  . Shortness of Breath    Devin Harmon is a 64 y.o. male.  The history is provided by the patient.  Shortness of Breath Severity:  Moderate Onset quality:  Gradual Timing:  Constant Progression:  Unchanged Chronicity:  New Context: URI   Relieved by:  Nothing Worsened by:  Activity Associated symptoms: cough, fever and sputum production   Associated symptoms: no abdominal pain, no chest pain, no ear pain, no rash, no sore throat and no vomiting   Risk factors comment:  ESRD on HD with dialysis yesterday, has had covid vaccine      Past Medical History:  Diagnosis Date  . Allergy   . Anemia   . BPH (benign prostatic hypertrophy)   . ESRD (end stage renal disease) ([place])    MWF - [place]  . Hypertension   . Leg weakness    right  . Slurred speech 02/07/2020   ? cva per Dr Silvio Pate, and cognitive changes  . Zoster 2007   facial, hospital 2007    Patient Active Problem List   Diagnosis Date Noted  . Right hemiparesis ([place]) 02/07/2020  . Cognitive decline 06/24/2019  . Allergic rhinitis due to pollen 02/10/2019  . End stage renal disease ([place]) 08/28/2018  . Routine general medical examination at a health care facility 06/20/2011  . Malignant hypertension 02/28/2010  . BPH without obstruction/lower urinary tract symptoms 01/04/2008  . Essential hypertension, benign 08/19/2007    Past Surgical History:  Procedure Laterality Date  . A/V FISTULAGRAM N/A 12/19/2019   Procedure: A/V FISTULAGRAM - Right Arm;  Surgeon: Waynetta Sandy, MD;  Location: [place] [facility];  Service: Cardiovascular;  Laterality: N/A;  . A/V FISTULAGRAM Left 03/15/2020   Procedure: A/V FISTULAGRAM;  Surgeon: Marty Heck, MD;  Location: [place] [facility];  Service: Cardiovascular;  Laterality: Left;  .  AV FISTULA PLACEMENT Right 09/01/2019   Procedure: ARTERIOVENOUS (AV) FISTULA CREATION RIGHT ARM;  Surgeon: Marty Heck, MD;  Location[facility];  Service: Vascular;  Laterality: Right;  . AV FISTULA PLACEMENT Left 02/09/2020   Procedure: LEFT ARM BASILIC VEIN Arteriovenous FISTULA  CREATION;  Surgeon: Marty Heck, MD;  Location: [place];  Service: Vascular;  Laterality: Left;  . BASCILIC VEIN TRANSPOSITION Left 05/17/2020   Procedure: LEFT ARM SECOND STAGE BASCILIC FISTULA;  Surgeon: Marty Heck, MD;  Location: [place];  Service: Vascular;  Laterality: Left;  . DIALYSIS/PERMA CATHETER INSERTION N/A 04/15/2019   Procedure: DIALYSIS/PERMA CATHETER INSERTION;  Surgeon: Katha Cabal, MD;  Location: [place] [facility];  Service: Cardiovascular;  Laterality: N/A;  . INSERTION OF DIALYSIS CATHETER Left 11/15/2019   Procedure: INSERTION OF LEFT TUNNEL DIALYSIS CATHETER;  Surgeon: Waynetta Sandy, MD;  Location: [place];  Service: Vascular;  Laterality: Left;  . LIGATION OF ARTERIOVENOUS  FISTULA Right 01/03/2020   Procedure: LIGATION OF ARTERIOVENOUS  FISTULA;  Surgeon: Serafina Mitchell, MD;  Location: [place];  Service: Vascular;  Laterality: Right;  . PERIPHERAL VASCULAR BALLOON ANGIOPLASTY Left 03/15/2020   Procedure: PERIPHERAL VASCULAR BALLOON ANGIOPLASTY;  Surgeon: Marty Heck, MD;  Location: [place] [facility];  Service: Cardiovascular;  Laterality: Left;  UPPER ARM  . PERIPHERAL VASCULAR INTERVENTION Right 12/19/2019   Procedure: PERIPHERAL VASCULAR INTERVENTION;  Surgeon: Waynetta Sandy, MD;  Location: [place] [facility];  Service: Cardiovascular;  Laterality: Right;  . REMOVAL OF A DIALYSIS CATHETER Right 11/15/2019   Procedure: REMOVAL OF A RIGHT TUNNEL DIALYSIS CATHETER;  Surgeon: Waynetta Sandy, MD;  Location: [facility];  Service: Vascular;  Laterality: Right;       Family History  Problem Relation Age of Onset  . Hypertension Mother    . Cancer Mother        lung cancer  . Hypertension Brother   . Heart disease Father   . Coronary artery disease Neg Hx   . Diabetes Neg Hx     Social History   Tobacco Use  . Smoking status: Never Smoker  . Smokeless tobacco: Never Used  Vaping Use  . Vaping Use: Never used  Substance Use Topics  . Alcohol use: Not Currently    Comment: occasional but none since 2019  . Drug use: Not Currently    Home Medications Prior to Admission medications   Medication Sig Start Date End Date Taking? Authorizing Provider  acetaminophen (TYLENOL) 500 MG tablet Take 1,000 mg by mouth every 6 (six) hours as needed for mild pain.    Yes [provider]  amLODipine (NORVASC) 10 MG tablet TAKE 1 TABLET BY MOUTH EVERY DAY Patient taking differently: Take 10 mg by mouth daily.  11/17/19  Yes Venia Carbon, MD  AURYXIA 1 GM 210 MG(Fe) tablet Take 420 mg by mouth 3 (three) times daily with meals.  07/08/19  Yes [provider]  B Complex-C-Folic Acid (RENA-VITE RX) 1 MG TABS Take 1 mg by mouth daily.   Yes [provider]  cetirizine (ZYRTEC) 10 MG tablet Take 10 mg by mouth daily as needed for allergies.    Yes [provider]  fluticasone (FLONASE) 50 MCG/ACT nasal spray Place 1-2 sprays into both nostrils daily as needed for allergies.    Yes [provider]  hydrocortisone cream 1 % Apply 1 application topically daily as needed (rash).   Yes [provider]  labetalol (NORMODYNE) 100 MG tablet TAKE 1 TABLET BY MOUTH TWICE A DAY Patient taking differently: Take 100 mg by mouth 2 (two) times daily.  11/14/19  Yes Venia Carbon, MD  neomycin-bacitracin-polymyxin (NEOSPORIN) ointment Apply 1 application topically daily as needed for wound care.    Yes [provider]  oxyCODONE-acetaminophen (PERCOCET) 7.5-325 MG tablet Take 1 tablet by mouth every 6 (six) hours as needed for severe pain. Patient not taking: Reported on 06/26/2020  05/17/20   Gabriel Earing, PA-C    Allergies    Sulfa antibiotics, Sulfonamide derivatives, Chlorthalidone, and Hydrocodone  Review of Systems   Review of Systems  Constitutional: Positive for fever. Negative for chills.  HENT: Negative for ear pain and sore throat.   Eyes: Negative for pain and visual disturbance.  Respiratory: Positive for cough, sputum production and shortness of breath.   Cardiovascular: Negative for chest pain and palpitations.  Gastrointestinal: Negative for abdominal pain and vomiting.  Genitourinary: Negative for dysuria and hematuria.  Musculoskeletal: Negative for arthralgias and back pain.  Skin: Negative for color change and rash.  Neurological: Negative for seizures and syncope.  All other systems reviewed and are negative.   Physical Exam Updated Vital Signs  ED Triage Vitals  Enc Vitals Group     BP 06/26/20 0848 (!) 156/78     Pulse Rate 06/26/20 0848 71     Resp 06/26/20 0848 (!) 26     Temp  06/26/20 0848 98 F (36.7 C)     Temp Source 06/26/20 0848 Oral     SpO2 06/26/20 0848 90 %     Weight --      Height --      Head Circumference --      Peak Flow --      Pain Score 06/26/20 1004 0     Pain Loc --      Pain Edu? --      Excl. in [place]? --     Physical Exam Vitals and nursing note reviewed.  HENT:     Head: Normocephalic and atraumatic.     Mouth/Throat:     Mouth: Mucous membranes are moist.  Eyes:     Conjunctiva/sclera: Conjunctivae normal.     Pupils: Pupils are equal, round, and reactive to light.  Cardiovascular:     Rate and Rhythm: Normal rate and regular rhythm.     Pulses: Normal pulses.     Heart sounds: Normal heart sounds. No murmur heard.   Pulmonary:     Effort: Tachypnea present. No respiratory distress.     Breath sounds: Examination of the right-upper field reveals decreased breath sounds. Examination of the right-middle field reveals decreased breath sounds. Examination of the right-lower field reveals  decreased breath sounds. Decreased breath sounds present.  Abdominal:     Palpations: Abdomen is soft.     Tenderness: There is no abdominal tenderness.  Musculoskeletal:     Cervical back: Normal range of motion and neck supple.     Right lower leg: Edema (2+) present.     Left lower leg: Edema (2+) present.  Skin:    General: Skin is warm and dry.     Capillary Refill: Capillary refill takes less than 2 seconds.  Neurological:     General: No focal deficit present.     Mental Status: He is alert.  Psychiatric:        Mood and Affect: Mood normal.     ED Results / Procedures / Treatments   Labs (all labs ordered are listed, but only abnormal results are displayed) Labs Reviewed  BASIC METABOLIC PANEL - Abnormal; Notable for the following components:      Result Value   CO2 21 (*)    BUN 56 (*)    Creatinine, Ser 10.14 (*)    Calcium 8.4 (*)    GFR calc non Af Amer 5 (*)    GFR calc Af Amer 6 (*)    Anion gap 19 (*)    All other components within normal limits  CBC - Abnormal; Notable for the following components:   RBC 3.93 (*)    Hemoglobin 11.1 (*)    HCT 36.1 (*)    RDW 19.4 (*)    All other components within normal limits  HEPATIC FUNCTION PANEL - Abnormal; Notable for the following components:   Bilirubin, Direct 0.3 (*)    All other components within normal limits  BRAIN NATRIURETIC PEPTIDE - Abnormal; Notable for the following components:   B Natriuretic Peptide >4,500.0 (*)    All other components within normal limits  TROPONIN I (HIGH SENSITIVITY) - Abnormal; Notable for the following components:   Troponin I (High Sensitivity) 54 (*)    All other components within normal limits  URINE CULTURE  CULTURE, BLOOD (ROUTINE X 2)  CULTURE, BLOOD (ROUTINE X 2)  SARS CORONAVIRUS 2 BY RT PCR (DIASORIN)  LACTIC ACID, PLASMA  URINALYSIS, ROUTINE W REFLEX MICROSCOPIC  LACTIC ACID, PLASMA  POC SARS CORONAVIRUS 2 AG -  ED  TROPONIN I (HIGH SENSITIVITY)     EKG EKG Interpretation  Date/Time:  Tuesday June 26 2020 08:45:45 EDT Ventricular Rate:  72 PR Interval:  180 QRS Duration: 98 QT Interval:  436 QTC Calculation: 477 R Axis:   99 Text Interpretation: Normal sinus rhythm Rightward axis Possible Anterior infarct , age undetermined Abnormal ECG Confirmed by Lennice Sites [phone]) on 06/26/2020 9:06:37 AM   Radiology DG Chest 2 View  Result Date: 06/26/2020 CLINICAL DATA:  Shortness of breath for 5 days.  Fever. EXAM: CHEST - 2 VIEW COMPARISON:  11/15/2019 FINDINGS: Left-sided dialysis catheter tip at superior caval/atrial junction. Patient rotated minimally left. Moderate cardiomegaly. Atherosclerosis in the transverse aorta. A large right pleural effusion. No left-sided pleural fluid. No pneumothorax. Suspect mild pulmonary venous congestion. Right lower and midlung airspace disease is most likely compressive atelectasis. IMPRESSION: Large right pleural effusion with likely secondary compressive atelectasis of the inferior and mid right lung. Cardiomegaly with suspicion of mild pulmonary venous congestion. Aortic Atherosclerosis (ICD10-I70.0). Consider repeat plain films after thoracentesis to more entirely evaluate the right lung. Electronically Signed   By: Abigail Miyamoto M.D.   On: 06/26/2020 09:29    Procedures .Critical Care Performed by: Lennice Sites, DO Authorized by: Lennice Sites, DO   Critical care provider statement:    Critical care time (minutes):  40   Critical care was necessary to treat or prevent imminent or life-threatening deterioration of the following conditions:  Respiratory failure   Critical care was time spent personally by me on the following activities:  Blood draw for specimens, development of treatment plan with patient or surrogate, discussions with consultants, discussions with primary provider, evaluation of patient's response to treatment, examination of patient, obtaining history from patient or  surrogate, ordering and review of laboratory studies, ordering and review of radiographic studies, ordering and performing treatments and interventions, pulse oximetry, re-evaluation of patient's condition and review of old charts   I assumed direction of critical care for this patient from another provider in my specialty: no     (including critical care time)  Medications Ordered in ED Medications  sodium chloride flush (NS) 0.9 % injection 3 mL (has no administration in time range)  azithromycin (ZITHROMAX) 500 mg in sodium chloride 0.9 % 250 mL IVPB (has no administration in time range)  cefTRIAXone (ROCEPHIN) 1 g in sodium chloride 0.9 % 100 mL IVPB (1 g Intravenous New Bag/Given 06/26/20 1014)    ED Course  I have reviewed the triage vital signs and the nursing notes.  Pertinent labs & imaging results that were available during my care of the patient were reviewed by me and considered in my medical decision making (see chart for details).    MDM Rules/Calculators/A&P                          Devin Harmon is a 64 year old male with history of hypertension, end-stage renal disease on hemodialysis who presents the ED with shortness of breath, cough, sputum production.  Patient tachypneic.  Otherwise unremarkable vitals.  Appears to have normal room air oxygenation.  Decreased breath sounds throughout but worse on the right.  Blood pressure is 308 systolic.  Had dialysis yesterday.  Possible pulmonary edema/volume overload versus infectious process.  Has been vaccinated against coronavirus.  Does have diffuse edema in his legs.  Patient placed on BiPAP upon arrival given work  of breathing which greatly improved his work of breathing.  He is comfortable on BiPAP.  Chest x-ray was done that does show pulmonary vascular congestion but patient also with new large right-sided pleural effusion.  No history of this.  No history of cancer.  Not a current smoker.  Possibly infectious given low-grade  temperature, cough, sputum production here recently.  Unsure of why patient has this effusion at this time.  Will give IV antibiotics and lab work to further evaluate.  EKG shows sinus rhythm.  I have talked with pulmonology who will evaluate the patient and perform thoracentesis.  Covid is negative.  BNP greater than 4500, mild troponin leak.  No lactic acidosis, no white count.  Less likely infectious process.  Had make-up dialysis session yesterday after missing 2 dialysis sessions.  Suspect likely pulmonary edema in the setting of volume overload.  Pulmonology to perform thoracentesis in the emergency department.  Will admit for further care with hospitalist.  Will consult nephrology.  This chart was dictated using voice recognition software.  Despite best efforts to proofread,  errors can occur which can change the documentation meaning.    Final Clinical Impression(s) / ED Diagnoses Final diagnoses:  Acute respiratory failure, unspecified whether with hypoxia or hypercapnia (HCC)  Hypervolemia, unspecified hypervolemia type  Pleural effusion    Rx / DC Orders ED Discharge Orders    None       Lennice Sites, DO 06/26/20 [place], [place], DO 06/26/20 1104

## 2020-06-26 NOTE — Procedures (Signed)
Patient seen on Hemodialysis. BP (!) 159/96   Pulse 64   Temp 98 F (36.7 C) (Oral)   Resp 22   SpO2 99%   QB 200, UF goal 2.5L Tolerating treatment without complaints at this time. Severely limited catheter flows--rinse blood back and lock catheter ports with TPA for 30 minutes and then retry HD.   Elmarie Shiley MD [facility]. Office # [phone] 2:05 PM

## 2020-06-26 NOTE — Progress Notes (Addendum)
Called by RN that patient having respiratory distress.  Patient moved with large right pleural effusion with respiratory failure and CHF.  Patient has end-stage renal disease on hemodialysis. Patient with tachypnea with respiratory rate of 30.  Pulse 75-80.  Patient was admitted with large pleural effusion that was drained earlier and patient had dialysis.  Patient had 2300 cc drawn off by thoracentesis and patient had another 2 L drawn off by dialysis. Rapid response nurse at bedside  Patient seen at bedside.  Patient with tachypnea with short shallow inspirations with diffuse wheezing.  Has JVD noted. Patient is refusing to wear BiPAP and refusing to wear nonrebreather to states it makes him feel smothered.  Cardiovascular regular rate and rhythm without murmur. Respiratory-apnea, diminished breath sounds with diffuse wheezing and crackles Abdomen-soft, nondistended nontender  Ordered stat ABG. Ordered albuterol nebulizer Critical care team has been notified and will come evaluate patient as patient is refusing her BiPAP and will most likely need intubation   Addendum: Critical Care has evaluated patient and feels that needs urgent HD session tonight.  Discussed with nephrology who will evaluate pt but states unsure if will be able to do secondary to no available slots with other emergent patients at this time but will evaluate patient.  ABG results: PH 7.43, CO2 36.6, PAO2 58, HCO3 24.5, SO2 90. Discussed with Critical Care and will try to calm patient with ativan and see if tolerates Bipap

## 2020-06-26 NOTE — Consult Note (Signed)
[facility] Renal Consultation Note    Indication for Consultation:  Management of ESRD/hemodialysis; anemia, hypertension/volume and secondary hyperparathyroidism  BHA:[id number], Theophilus Kinds, MD  HPI: Devin Harmon is a 64 y.o. male. ESRD 2/2 HTN on HD MWF at [facility], first starting in 03/2019.  Past medical history significant for HTN and BPH.   Patient is non compliant with prescribed dialysis regimen, typically signing off at least 17min early and occasionally missing treatments.  Last dialysis 06/25/20, after missing 06/22/20, he left 69min early and 4.5L over his dry weight.    Patient presented to the ED due to shortness of breath.  Majority of history obtain from wife due to worsened respiratory status.  She reports he missed HD on Friday due to diarrhea.  Saturday he started to become SOB which progressively worsened over the last 3 days.  On Sunday he developed a productive cough with clear sputum and runny nose.  He went to dialysis yesterday and returned home with worsened dyspnea.  This morning he felt hot and had a low grade temp of 99.3 and asked his wife to bring him to the ED.  Wife reports edema in lower extremities has worsened over the last few months.  Stating he use to have edema mostly in his abdomen and feet and now it is thorough his hips and thighs as well.  No changes in his appetite and does not think he has lost weight.  Now he reports slightly improved SOB s/p thoracentesis.  Denies n/v/d, CP, abdominal pain, weakness and fatigue.    Pertinent findings in the ED include hypertension, hypoxia requiring BiPAP and now 4L O2 via [place], BNP >4500, negative COVID, initial CXR with large R pleural effusion with likely secondary compressive atelectasis of the inferior and mid right lung as well as cardiomegaly with suspicion of mild pulmonary venous congestion.  S/p thoracentesis yielding ~2.5 - 3L dark yellow fluid. CXR post procedure with marked decrease in size of a  RIGHT-sided pleural effusion. No pneumothorax is evident and persistent R basilar airspace disease and subtle L basilar opacities likely atelectasis with diminished lung volume since the comparison evaluation.  Patient has been admitted for further evaluation and management.   Past Medical History:  Diagnosis Date  . Allergy   . Anemia   . BPH (benign prostatic hypertrophy)   . ESRD (end stage renal disease) ([place])    MWF - [place]  . Hypertension   . Leg weakness    right  . Slurred speech 02/07/2020   ? cva per Dr Silvio Pate, and cognitive changes  . [place] 2007   facial, hospital 2007   Past Surgical History:  Procedure Laterality Date  . A/V FISTULAGRAM N/A 12/19/2019   Procedure: A/V FISTULAGRAM - Right Arm;  Surgeon: Waynetta Sandy, MD;  Location: [place] [facility];  Service: Cardiovascular;  Laterality: N/A;  . A/V FISTULAGRAM Left 03/15/2020   Procedure: A/V FISTULAGRAM;  Surgeon: Marty Heck, MD;  Location: [place] [facility];  Service: Cardiovascular;  Laterality: Left;  . AV FISTULA PLACEMENT Right 09/01/2019   Procedure: ARTERIOVENOUS (AV) FISTULA CREATION RIGHT ARM;  Surgeon: Marty Heck, MD;  Location: [facility] OR;  Service: Vascular;  Laterality: Right;  . AV FISTULA PLACEMENT Left 02/09/2020   Procedure: LEFT ARM BASILIC VEIN Arteriovenous FISTULA  CREATION;  Surgeon: Marty Heck, MD;  Location: [place];  Service: Vascular;  Laterality: Left;  . BASCILIC VEIN TRANSPOSITION Left 05/17/2020   Procedure: LEFT ARM  SECOND STAGE BASCILIC FISTULA;  Surgeon: Marty Heck, MD;  Location: [place];  Service: Vascular;  Laterality: Left;  . DIALYSIS/PERMA CATHETER INSERTION N/A 04/15/2019   Procedure: DIALYSIS/PERMA CATHETER INSERTION;  Surgeon: Katha Cabal, MD;  Location: [place] [facility];  Service: Cardiovascular;  Laterality: N/A;  . INSERTION OF DIALYSIS CATHETER Left 11/15/2019   Procedure: INSERTION OF LEFT TUNNEL DIALYSIS CATHETER;   Surgeon: Waynetta Sandy, MD;  Location: [place];  Service: Vascular;  Laterality: Left;  . LIGATION OF ARTERIOVENOUS  FISTULA Right 01/03/2020   Procedure: LIGATION OF ARTERIOVENOUS  FISTULA;  Surgeon: Serafina Mitchell, MD;  Location: [place];  Service: Vascular;  Laterality: Right;  . PERIPHERAL VASCULAR BALLOON ANGIOPLASTY Left 03/15/2020   Procedure: PERIPHERAL VASCULAR BALLOON ANGIOPLASTY;  Surgeon: Marty Heck, MD;  Location: [place] [facility];  Service: Cardiovascular;  Laterality: Left;  UPPER ARM  . PERIPHERAL VASCULAR INTERVENTION Right 12/19/2019   Procedure: PERIPHERAL VASCULAR INTERVENTION;  Surgeon: Waynetta Sandy, MD;  Location: [place] [facility];  Service: Cardiovascular;  Laterality: Right;  . REMOVAL OF A DIALYSIS CATHETER Right 11/15/2019   Procedure: REMOVAL OF A RIGHT TUNNEL DIALYSIS CATHETER;  Surgeon: Waynetta Sandy, MD;  Location: [place];  Service: Vascular;  Laterality: Right;   Family History  Problem Relation Age of Onset  . Hypertension Mother   . Cancer Mother        lung cancer  . Hypertension Brother   . Heart disease Father   . Coronary artery disease Neg Hx   . Diabetes Neg Hx    Social History:  reports that he has never smoked. He has never used smokeless tobacco. He reports previous alcohol use. He reports previous drug use. Allergies  Allergen Reactions  . Sulfa Antibiotics Hives  . Sulfonamide Derivatives Hives  . Chlorthalidone Other (See Comments)    felt "bad" and ED   . Hydrocodone Nausea Only    dizzy   Prior to Admission medications   Medication Sig Start Date End Date Taking? Authorizing Provider  acetaminophen (TYLENOL) 500 MG tablet Take 1,000 mg by mouth every 6 (six) hours as needed for mild pain.    Yes [provider]  amLODipine (NORVASC) 10 MG tablet TAKE 1 TABLET BY MOUTH EVERY DAY Patient taking differently: Take 10 mg by mouth daily.  11/17/19  Yes Venia Carbon, MD  AURYXIA 1  GM 210 MG(Fe) tablet Take 420 mg by mouth 3 (three) times daily with meals.  07/08/19  Yes [provider]  B Complex-C-Folic Acid (RENA-VITE RX) 1 MG TABS Take 1 mg by mouth daily.   Yes [provider]  cetirizine (ZYRTEC) 10 MG tablet Take 10 mg by mouth daily as needed for allergies.    Yes [provider]  fluticasone (FLONASE) 50 MCG/ACT nasal spray Place 1-2 sprays into both nostrils daily as needed for allergies.    Yes [provider]  hydrocortisone cream 1 % Apply 1 application topically daily as needed (rash).   Yes [provider]  labetalol (NORMODYNE) 100 MG tablet TAKE 1 TABLET BY MOUTH TWICE A DAY Patient taking differently: Take 100 mg by mouth 2 (two) times daily.  11/14/19  Yes Venia Carbon, MD  neomycin-bacitracin-polymyxin (NEOSPORIN) ointment Apply 1 application topically daily as needed for wound care.    Yes [provider]  oxyCODONE-acetaminophen (PERCOCET) 7.5-325 MG tablet Take 1 tablet by mouth every 6 (six) hours as needed for severe pain.  Patient not taking: Reported on 06/26/2020 05/17/20   Gabriel Earing, PA-C   Current Facility-Administered Medications  Medication Dose Route Frequency Provider Last Rate Last Admin  . [START ON 06/27/2020] Chlorhexidine Gluconate Cloth 2 % PADS 6 each  6 each Topical Q0600 Daunte Oestreich, PA      . sodium chloride flush (NS) 0.9 % injection 3 mL  3 mL Intravenous Once Lennice Sites, DO       Current Outpatient Medications  Medication Sig Dispense Refill  . acetaminophen (TYLENOL) 500 MG tablet Take 1,000 mg by mouth every 6 (six) hours as needed for mild pain.     Marland Kitchen amLODipine (NORVASC) 10 MG tablet TAKE 1 TABLET BY MOUTH EVERY DAY (Patient taking differently: Take 10 mg by mouth daily. ) 90 tablet 3  . AURYXIA 1 GM 210 MG(Fe) tablet Take 420 mg by mouth 3 (three) times daily with meals.     . B Complex-C-Folic Acid (RENA-VITE RX) 1 MG TABS Take 1 mg by mouth daily.     . cetirizine (ZYRTEC) 10 MG tablet Take 10 mg by mouth daily as needed for allergies.     . fluticasone (FLONASE) 50 MCG/ACT nasal spray Place 1-2 sprays into both nostrils daily as needed for allergies.     . hydrocortisone cream 1 % Apply 1 application topically daily as needed (rash).    . labetalol (NORMODYNE) 100 MG tablet TAKE 1 TABLET BY MOUTH TWICE A DAY (Patient taking differently: Take 100 mg by mouth 2 (two) times daily. ) 180 tablet 3  . neomycin-bacitracin-polymyxin (NEOSPORIN) ointment Apply 1 application topically daily as needed for wound care.     Marland Kitchen oxyCODONE-acetaminophen (PERCOCET) 7.5-325 MG tablet Take 1 tablet by mouth every 6 (six) hours as needed for severe pain. (Patient not taking: Reported on 06/26/2020) 20 tablet 0   Labs: Basic Metabolic Panel: Recent Labs  Lab 06/26/20 0858  NA 138  K 5.0  CL 98  CO2 21*  GLUCOSE 97  BUN 56*  CREATININE 10.14*  CALCIUM 8.4*   Liver Function Tests: Recent Labs  Lab 06/26/20 0858  AST 25  ALT 19  ALKPHOS 69  BILITOT 1.2  PROT 7.4  ALBUMIN 4.0   CBC: Recent Labs  Lab 06/26/20 0858  WBC 8.7  HGB 11.1*  HCT 36.1*  MCV 91.9  PLT 241   Studies/Results: DG Chest 2 View  Result Date: 06/26/2020 CLINICAL DATA:  Shortness of breath for 5 days.  Fever. EXAM: CHEST - 2 VIEW COMPARISON:  11/15/2019 FINDINGS: Left-sided dialysis catheter tip at superior caval/atrial junction. Patient rotated minimally left. Moderate cardiomegaly. Atherosclerosis in the transverse aorta. A large right pleural effusion. No left-sided pleural fluid. No pneumothorax. Suspect mild pulmonary venous congestion. Right lower and midlung airspace disease is most likely compressive atelectasis. IMPRESSION: Large right pleural effusion with likely secondary compressive atelectasis of the inferior and mid right lung. Cardiomegaly with suspicion of mild pulmonary venous congestion. Aortic Atherosclerosis (ICD10-I70.0). Consider repeat plain films after  thoracentesis to more entirely evaluate the right lung. Electronically Signed   By: Abigail Miyamoto M.D.   On: 06/26/2020 09:29   DG Chest Port 1 View  Result Date: 06/26/2020 CLINICAL DATA:  Post thoracentesis EXAM: PORTABLE CHEST 1 VIEW COMPARISON:  June 26, 2020, prior to thoracentesis FINDINGS: LEFT-sided vascular access device terminates at the caval to atrial junction. Marked decrease in size of a RIGHT-sided pleural effusion since the previous evaluation. No pneumothorax. Persistent RIGHT basilar airspace disease. Vascular congestion.  Cardiomediastinal contours accentuated by low lung volumes, unchanged from the previous exam. On limited assessment visualized skeletal structures without acute bone finding. IMPRESSION: 1. Marked decrease in size of a RIGHT-sided pleural effusion following thoracentesis. No pneumothorax is evident. 2. Persistent RIGHT basilar airspace disease. 3. Subtle LEFT basilar opacities likely atelectasis with diminished lung volume since the comparison evaluation. Electronically Signed   By: Zetta Bills M.D.   On: 06/26/2020 12:24    ROS: All others negative except those listed in HPI.  Physical Exam: Vitals:   06/26/20 0949 06/26/20 1030 06/26/20 1045 06/26/20 1100  BP:  (!) 172/84 (!) 159/96   Pulse: 81 66 66 64  Resp: (!) 29 (!) 24 20 22   Temp:      TempSrc:      SpO2: 99% 100% 100% 100%     General: WDWN, chronically ill appearing male in NAD Head: NCAT sclera not icteric  Neck: Supple. No lymphadenopathy, +JVD  Lungs:  +tachypnea. on 4L O2 via [place], +crackles and wheezes throughout, BS diminished in R base and decreased on L Heart: RRR. No murmur, rubs or gallops.  Abdomen: soft, nontender, +BS, no guarding, no rebound tenderness Lower extremities:3+ edema in hips b/l, 2+ edema in b/l calves/feet Neuro: AAOx3. Moves all extremities spontaneously. Psych:  Responds to questions appropriately with a normal affect. Dialysis Access: TDC, LU AVF +b/t  Dialysis  Orders:  MWF [place]  4.5 hrs, BFR 400, DFR 800,  EDW 91.5kg, 2K/ 2.5Ca  Access: TDC. LU AVF maturing - ready for use starting 06/16/20 Heparin 3000 Mircera 100 mcg q2wks - last 06/20/20 Venofer 100mg  qwk  Calcitriol 1.71mcg PO qHD Auryxia 2 tab AC TID  Assessment/Plan: 1.  Hypoxia/pulmonary effusion/Pulmonary edema - Volume overload 2/2 to chronic non compliance with dialysis. CXR revealed large pleural effusion on R (improved on repeat CXR), and pulmonary edema bilaterally.  On 4L O2 via [place] currently s/p thoracentesis with ~2.5-3L yield dark yellow fluid, sent for cultures. Plan for extra HD today for volume removal. Net UF 2.5L planned. May need to have EDW lowered.  2.  ESRD -  On HD MWF.  Chronically non complaint, shortening most every treatment.  Plan for extra HD today for volume removal and resume regular schedule tomorrow. K 5.0, use 2K bath.  Ok to start using AVF starting 7/17 per [facility] last note.  Will order to cannulate with HD tomorrow.  3.  Hypertension  - BP elevated.  Expect improvement post HD. Continue amlodipine 10mg  qd and labetalol 100mg  BID.  4.  Anemia of CKD - Hgb 11.1.  ESA recently dosed.  5.  Secondary Hyperparathyroidism -  Ca in acceptable range, use 2.5Ca bath. Will check phos. Continue VDRA and binders.  6.  Nutrition - Renal diet with fluid restrictions. Alb 4.0. vitamins  Jen Mow, PA-C [facility] 06/26/2020, 12:46 PM

## 2020-06-26 NOTE — ED Triage Notes (Signed)
Pt arrives to ED with family due to sob ESRD pt last treatment was yesterday. Pt does have labored respirations with use of accessory muscles.  Pt has sat's of 95% with increased respirations RR: 33.

## 2020-06-26 NOTE — Procedures (Signed)
Thoracentesis  Procedure Note  CARMELLO CABINESS  [id number]  09-26-1956  Date:06/26/20  Time:12:51 PM   Provider Performing:Danzell Birky   Procedure: Thoracentesis with imaging guidance (23557)  Indication(s) Pleural Effusion  Consent Risks of the procedure as well as the alternatives and risks of each were explained to the patient and/or caregiver.  Consent for the procedure was obtained and is signed in the bedside chart  Anesthesia Topical only with 1% lidocaine    Time Out Verified patient identification, verified procedure, site/side was marked, verified correct patient position, special equipment/implants available, medications/allergies/relevant history reviewed, required imaging and test results available.   Sterile Technique Maximal sterile technique including full sterile barrier drape, hand hygiene, sterile gown, sterile gloves, mask, hair covering, sterile ultrasound probe cover (if used).  Procedure Description Ultrasound was used to identify appropriate pleural anatomy for placement and overlying skin marked.  Area of drainage cleaned and draped in sterile fashion. Lidocaine was used to anesthetize the skin and subcutaneous tissue.  2300 cc's of clear yellow appearing fluid was drained from the right pleural space. Catheter then removed and bandaid applied to site.   Complications/Tolerance None; patient tolerated the procedure well. Chest X-ray is ordered to confirm no post-procedural complication.   EBL Minimal   Specimen(s) Pleural fluid

## 2020-06-26 NOTE — Consult Note (Addendum)
NAME:  Devin Harmon, MRN:  [id number], DOB:  12-18-1955, LOS: 0 ADMISSION DATE:  06/26/2020, CONSULTATION DATE:  06/26/20 REFERRING MD:  Ronnald Nian, CHIEF COMPLAINT:  Shortness of breath  Brief History   64yom presenting with acute respiratory failure 2/2 a large right pleural effusion and pulm edema that is attributable to hypervolemic state in the setting of missed HD.   History of present illness   72 yom with ESRD on HD who presented to [facility] on 06/26/20 for 2d history of progressive shortness of breath. Wife is primary historian due to pt being on BiPAP.   She notes that he missed HD on Friday due to diarrhea. Since that time, he developed progressive shortness of breath and loss of appetite. He was able to make it to HD on day prior to admission, however it is unclear how much fluid was removed. Over the same time period, he additionally developed orthopnea which he does not have at baseline.  Also of note, his wife explains that he recently changed HD centers and has developed worsening LE edema since then. Denies recent fevers, chills or respiratory symptoms other than the shortness of breath.   Past Medical History  ESRD on HD, hypertension  Significant Hospital Events   7/27 admission; thoracentesis  Consults:  PCCM Nephrology  Procedures:  Right thoracentesis 7/27>>2318mL clear yellow fluid  Significant Diagnostic Tests:  7/27 CXR>> pulm edema with large right pleural effusion. Cardiomegaly. Bibasilar atelectasis  Micro Data:  COVID neg Pleural fluid cultures>>  Antimicrobials:  n/a  Interim history/subjective:  See above  Objective   Blood pressure (!) 159/96, pulse 64, temperature 98 F (36.7 C), temperature source Oral, resp. rate 22, SpO2 100 %.        Intake/Output Summary (Last 24 hours) at 06/26/2020 1227 Last data filed at 06/26/2020 1106 Gross per 24 hour  Intake 100 ml  Output --  Net 100 ml   There were no vitals filed for this  visit.  Examination: General: acutely ill appearing male HENT: BiPAP on Lungs: breathing comfortably on 3L [place].  lung sounds diminished R>L Cardiovascular: RRR. JVD to the angle of the mandible. +3 edema to lower extremities Abdomen: firm, moderately distended. Not painful to palpation Extremities: warm Neuro: a/o x3 Skin: no rash  Resolved Hospital Problem list   n/a  Assessment & Plan:   Acute hypoxic respiratory failure requiring NIPPV secondary to pulm edema and bilateral pleural effusions--suspect transudative. Attributable to hypervolemic state in setting of missed HD sessions. S/P right thoracentesis--2389mL removed. Fluid not concerning for infectious source however will send for analysis and transudative confirmation. Now on 3L [place]. Plan Wean supplemental oxygen as able Coughing expected as lungs re-expand. Consider obtaining repeat CXR to evaluate for re-expansion edema if respiratory status deteriorates. Do not suspect he will need antibiotics to cover pulm infectious source Volume removal via HD per nephrology F/u fluid labs/culture  ESRD on HD MWF. Suspect hypervolemic state attributable to missing HD last week. Management per nephrology--planning on HD today.   Remaining medical issues per primary  Best practice:  Diet: renal Pain/Anxiety/Delirium protocol (if indicated): n/a VAP protocol (if indicated): n/a DVT prophylaxis: heparin GI prophylaxis: n/a Glucose control: per primary Mobility: with assist Code Status: Full Family Communication: wife updated at bedside Disposition: per [facility]  Labs   CBC: Recent Labs  Lab 06/26/20 0858  WBC 8.7  HGB 11.1*  HCT 36.1*  MCV 91.9  PLT 938    Basic Metabolic Panel: Recent Labs  Lab 06/26/20 0858  NA 138  K 5.0  CL 98  CO2 21*  GLUCOSE 97  BUN 56*  CREATININE 10.14*  CALCIUM 8.4*   GFR: CrCl cannot be calculated (Unknown ideal weight.). Recent Labs  Lab 06/26/20 0858 06/26/20 0943  WBC 8.7  --    LATICACIDVEN  --  1.5    Liver Function Tests: Recent Labs  Lab 06/26/20 0858  AST 25  ALT 19  ALKPHOS 69  BILITOT 1.2  PROT 7.4  ALBUMIN 4.0   No results for input(s): LIPASE, AMYLASE in the last 168 hours. No results for input(s): AMMONIA in the last 168 hours.  ABG    Component Value Date/Time   PHART 7.27 (L) 04/12/2019 1801   PCO2ART 39 04/12/2019 1801   PO2ART 155 (H) 04/12/2019 1801   HCO3 17.9 (L) 04/12/2019 1801   TCO2 26 05/17/2020 1003   ACIDBASEDEF 8.4 (H) 04/12/2019 1801   O2SAT 99.1 04/12/2019 1801     Coagulation Profile: No results for input(s): INR, PROTIME in the last 168 hours.  Cardiac Enzymes: No results for input(s): CKTOTAL, CKMB, CKMBINDEX, TROPONINI in the last 168 hours.  HbA1C: No results found for: HGBA1C  CBG: No results for input(s): GLUCAP in the last 168 hours.  Review of Systems:   Unable to be obtained due to critical illness  Past Medical History  He,  has a past medical history of Allergy, Anemia, BPH (benign prostatic hypertrophy), ESRD (end stage renal disease) ([place]), Hypertension, Leg weakness, Slurred speech (02/07/2020), and Zoster (2007).   Surgical History    Past Surgical History:  Procedure Laterality Date  . A/V FISTULAGRAM N/A 12/19/2019   Procedure: A/V FISTULAGRAM - Right Arm;  Surgeon: Waynetta Sandy, MD;  Location: [place] [facility] LAB;  Service: Cardiovascular;  Laterality: N/A;  . A/V FISTULAGRAM Left 03/15/2020   Procedure: A/V FISTULAGRAM;  Surgeon: Marty Heck, MD;  Location: [place] [facility];  Service: Cardiovascular;  Laterality: Left;  . AV FISTULA PLACEMENT Right 09/01/2019   Procedure: ARTERIOVENOUS (AV) FISTULA CREATION RIGHT ARM;  Surgeon: Marty Heck, MD;  Location: [facility];  Service: Vascular;  Laterality: Right;  . AV FISTULA PLACEMENT Left 02/09/2020   Procedure: LEFT ARM BASILIC VEIN Arteriovenous FISTULA  CREATION;  Surgeon: Marty Heck, MD;  Location: [place];  Service: Vascular;  Laterality: Left;  . BASCILIC VEIN TRANSPOSITION Left 05/17/2020   Procedure: LEFT ARM SECOND STAGE BASCILIC FISTULA;  Surgeon: Marty Heck, MD;  Location: [place];  Service: Vascular;  Laterality: Left;  . DIALYSIS/PERMA CATHETER INSERTION N/A 04/15/2019   Procedure: DIALYSIS/PERMA CATHETER INSERTION;  Surgeon: Katha Cabal, MD;  Location: [place] [facility];  Service: Cardiovascular;  Laterality: N/A;  . INSERTION OF DIALYSIS CATHETER Left 11/15/2019   Procedure: INSERTION OF LEFT TUNNEL DIALYSIS CATHETER;  Surgeon: Waynetta Sandy, MD;  Location: [place];  Service: Vascular;  Laterality: Left;  . LIGATION OF ARTERIOVENOUS  FISTULA Right 01/03/2020   Procedure: LIGATION OF ARTERIOVENOUS  FISTULA;  Surgeon: Serafina Mitchell, MD;  Location: [place];  Service: Vascular;  Laterality: Right;  . PERIPHERAL VASCULAR BALLOON ANGIOPLASTY Left 03/15/2020   Procedure: PERIPHERAL VASCULAR BALLOON ANGIOPLASTY;  Surgeon: Marty Heck, MD;  Location: [place] [facility];  Service: Cardiovascular;  Laterality: Left;  UPPER ARM  . PERIPHERAL VASCULAR INTERVENTION Right 12/19/2019   Procedure: PERIPHERAL VASCULAR INTERVENTION;  Surgeon: Waynetta Sandy, MD;  Location: [place] [facility];  Service: Cardiovascular;  Laterality: Right;  . REMOVAL OF A DIALYSIS CATHETER Right 11/15/2019   Procedure: REMOVAL OF A RIGHT TUNNEL DIALYSIS CATHETER;  Surgeon: Waynetta Sandy, MD;  Location: [place];  Service: Vascular;  Laterality: Right;     Social History   reports that he has never smoked. He has never used smokeless tobacco. He reports previous alcohol use. He reports previous drug use.   Family History   His family history includes Cancer in his mother; Heart disease in his father; Hypertension in his brother and mother. There is no history of Coronary artery disease or Diabetes.   Allergies Allergies  Allergen Reactions  . Sulfa Antibiotics Hives   . Sulfonamide Derivatives Hives  . Chlorthalidone Other (See Comments)    felt "bad" and ED   . Hydrocodone Nausea Only    dizzy     Home Medications  Prior to Admission medications   Medication Sig Start Date End Date Taking? Authorizing Provider  acetaminophen (TYLENOL) 500 MG tablet Take 1,000 mg by mouth every 6 (six) hours as needed for mild pain.    Yes [provider]  amLODipine (NORVASC) 10 MG tablet TAKE 1 TABLET BY MOUTH EVERY DAY Patient taking differently: Take 10 mg by mouth daily.  11/17/19  Yes Venia Carbon, MD  AURYXIA 1 GM 210 MG(Fe) tablet Take 420 mg by mouth 3 (three) times daily with meals.  07/08/19  Yes [provider]  B Complex-C-Folic Acid (RENA-VITE RX) 1 MG TABS Take 1 mg by mouth daily.   Yes [provider]  cetirizine (ZYRTEC) 10 MG tablet Take 10 mg by mouth daily as needed for allergies.    Yes [provider]  fluticasone (FLONASE) 50 MCG/ACT nasal spray Place 1-2 sprays into both nostrils daily as needed for allergies.    Yes [provider]  hydrocortisone cream 1 % Apply 1 application topically daily as needed (rash).   Yes [provider]  labetalol (NORMODYNE) 100 MG tablet TAKE 1 TABLET BY MOUTH TWICE A DAY Patient taking differently: Take 100 mg by mouth 2 (two) times daily.  11/14/19  Yes Venia Carbon, MD  neomycin-bacitracin-polymyxin (NEOSPORIN) ointment Apply 1 application topically daily as needed for wound care.    Yes [provider]  oxyCODONE-acetaminophen (PERCOCET) 7.5-325 MG tablet Take 1 tablet by mouth every 6 (six) hours as needed for severe pain. Patient not taking: Reported on 06/26/2020 05/17/20   Gabriel Earing, PA-C     Mitzi Hansen, MD [place] PGY-2 06/26/20  12:27 PM  PCCM:   64 yo M, ESRD, on HD, missed HD, now with large right sided effusion. Pulmonary consulted for thoracentesis   BP (!) 159/96   Pulse 64   Temp 98 F  (36.7 C) (Oral)   Resp 22   SpO2 99%   Gen: elderly male, on BIPAP in ED HENT: mild JVD on right  Heart: RRR, s1 s2 Lungs: near absent breaths on the right  Ext: BL LE Edema   Labs reviewed CXR: 7/27 - large right sided effusion   A:  Right sided pleural effusion  AHRF on BIPAP  ESRD on HD  Fluid overloaded   P: Plan for right thoracentesis  Discussed R/B/A with patient and family at bedside  We will send for fluid analysis  Please see separate orders He needs HD I think I discussed with nephrology Dr. Posey Pronto.  Once effusion drained he can prob come off bipap  Garner Nash,  DO [facility] 06/26/2020 1:52 PM

## 2020-06-26 NOTE — Progress Notes (Signed)
Critical Care Attending Note  Called back to see for respiratory distress.   Increased WOB but saturating 97% on 15L. JVP elevated.   Doesn't need intubation now but would benefit from urgent HD treatment.   Avoid laying patient down.   Kipp Brood, MD [facility] ICU Physician [place]  Pager: [phone] Mobile: [phone] After hours: [phone].  06/26/2020, 9:01 PM

## 2020-06-26 NOTE — H&P (Signed)
History and Physical    DOA: 06/26/2020  PCP: Venia Carbon, MD  Patient coming from: Home  Chief Complaint: Dyspnea  HPI: Devin Harmon is a 64 y.o. male with history h/o hypertension, BPH, ESRD on dialysis Monday Wednesday Friday presents to the ED with complaints of progressive dyspnea associated with productive cough, increased leg swellings as well as orthopnea in the last few days.  He had dialysis yesterday but apparently missed 1 session prior to that.  He apparently changed hemodialysis centers recently.  No report of fevers or chest pain or dizziness. ED course: Tachypneic on arrival,  O2 sat 90% on room air.  Placed on BiPAP initially.  Chest x-ray was done that showed pulmonary vascular congestion as well as new large right-sided pleural effusion.  BNP greater than 4500, sodium 138, potassium 5, bicarb 21, BUN 56, creatinine 10.1, anion gap 19, albumin 4, LDH 215, HS troponin 54, lactate 1.5.  Albumin 4.0.  EKG normal sinus rhythm with right axis deviation, QTC 477 ms.  Seen by pulmonary and underwent bedside thoracentesis with drainage of 2300 mL of clear/yellow pleural fluid.  Patient transition to 4 L nasal cannula and sent to dialysis.  Patient placed back on BiPAP by RT as he had increased work of breathing in spite of nasal cannula O2 while in dialysis.  Review of Systems: As per HPI otherwise review of systems negative.    Past Medical History:  Diagnosis Date  . Allergy   . Anemia   . BPH (benign prostatic hypertrophy)   . ESRD (end stage renal disease) ([place])    MWF - [place]  . Hypertension   . Leg weakness    right  . Slurred speech 02/07/2020   ? cva per Dr Silvio Pate, and cognitive changes  . Zoster 2007   facial, hospital 2007    Past Surgical History:  Procedure Laterality Date  . A/V FISTULAGRAM N/A 12/19/2019   Procedure: A/V FISTULAGRAM - Right Arm;  Surgeon: Waynetta Sandy, MD;  Location: [place] [facility];  Service: Cardiovascular;   Laterality: N/A;  . A/V FISTULAGRAM Left 03/15/2020   Procedure: A/V FISTULAGRAM;  Surgeon: Marty Heck, MD;  Location: [place] [facility];  Service: Cardiovascular;  Laterality: Left;  . AV FISTULA PLACEMENT Right 09/01/2019   Procedure: ARTERIOVENOUS (AV) FISTULA CREATION RIGHT ARM;  Surgeon: Marty Heck, MD;  Location: [facility] OR;  Service: Vascular;  Laterality: Right;  . AV FISTULA PLACEMENT Left 02/09/2020   Procedure: LEFT ARM BASILIC VEIN Arteriovenous FISTULA  CREATION;  Surgeon: Marty Heck, MD;  Location: [place];  Service: Vascular;  Laterality: Left;  . BASCILIC VEIN TRANSPOSITION Left 05/17/2020   Procedure: LEFT ARM SECOND STAGE BASCILIC FISTULA;  Surgeon: Marty Heck, MD;  Location: [place];  Service: Vascular;  Laterality: Left;  . DIALYSIS/PERMA CATHETER INSERTION N/A 04/15/2019   Procedure: DIALYSIS/PERMA CATHETER INSERTION;  Surgeon: Katha Cabal, MD;  Location: [place] [facility];  Service: Cardiovascular;  Laterality: N/A;  . INSERTION OF DIALYSIS CATHETER Left 11/15/2019   Procedure: INSERTION OF LEFT TUNNEL DIALYSIS CATHETER;  Surgeon: Waynetta Sandy, MD;  Location: [place];  Service: Vascular;  Laterality: Left;  . LIGATION OF ARTERIOVENOUS  FISTULA Right 01/03/2020   Procedure: LIGATION OF ARTERIOVENOUS  FISTULA;  Surgeon: Serafina Mitchell, MD;  Location: [place];  Service: Vascular;  Laterality: Right;  . PERIPHERAL VASCULAR BALLOON ANGIOPLASTY Left 03/15/2020   Procedure: PERIPHERAL VASCULAR BALLOON ANGIOPLASTY;  Surgeon: Carlis Abbott,  Gwenyth Allegra, MD;  Location: [place] [facility];  Service: Cardiovascular;  Laterality: Left;  UPPER ARM  . PERIPHERAL VASCULAR INTERVENTION Right 12/19/2019   Procedure: PERIPHERAL VASCULAR INTERVENTION;  Surgeon: Waynetta Sandy, MD;  Location: [place] [facility];  Service: Cardiovascular;  Laterality: Right;  . REMOVAL OF A DIALYSIS CATHETER Right 11/15/2019   Procedure: REMOVAL OF A RIGHT TUNNEL  DIALYSIS CATHETER;  Surgeon: Waynetta Sandy, MD;  Location: [place];  Service: Vascular;  Laterality: Right;    Social history:  reports that he has never smoked. He has never used smokeless tobacco. He reports previous alcohol use. He reports previous drug use.   Allergies  Allergen Reactions  . Sulfa Antibiotics Hives  . Sulfonamide Derivatives Hives  . Chlorthalidone Other (See Comments)    felt "bad" and ED   . Hydrocodone Nausea Only    dizzy    Family History  Problem Relation Age of Onset  . Hypertension Mother   . Cancer Mother        lung cancer  . Hypertension Brother   . Heart disease Father   . Coronary artery disease Neg Hx   . Diabetes Neg Hx       Prior to Admission medications   Medication Sig Start Date End Date Taking? Authorizing Provider  acetaminophen (TYLENOL) 500 MG tablet Take 1,000 mg by mouth every 6 (six) hours as needed for mild pain.    Yes [provider]  amLODipine (NORVASC) 10 MG tablet TAKE 1 TABLET BY MOUTH EVERY DAY Patient taking differently: Take 10 mg by mouth daily.  11/17/19  Yes Venia Carbon, MD  AURYXIA 1 GM 210 MG(Fe) tablet Take 420 mg by mouth 3 (three) times daily with meals.  07/08/19  Yes [provider]  B Complex-C-Folic Acid (RENA-VITE RX) 1 MG TABS Take 1 mg by mouth daily.   Yes [provider]  cetirizine (ZYRTEC) 10 MG tablet Take 10 mg by mouth daily as needed for allergies.    Yes [provider]  fluticasone (FLONASE) 50 MCG/ACT nasal spray Place 1-2 sprays into both nostrils daily as needed for allergies.    Yes [provider]  hydrocortisone cream 1 % Apply 1 application topically daily as needed (rash).   Yes [provider]  labetalol (NORMODYNE) 100 MG tablet TAKE 1 TABLET BY MOUTH TWICE A DAY Patient taking differently: Take 100 mg by mouth 2 (two) times daily.  11/14/19  Yes Venia Carbon, MD  neomycin-bacitracin-polymyxin (NEOSPORIN)  ointment Apply 1 application topically daily as needed for wound care.    Yes [provider]  oxyCODONE-acetaminophen (PERCOCET) 7.5-325 MG tablet Take 1 tablet by mouth every 6 (six) hours as needed for severe pain. Patient not taking: Reported on 06/26/2020 05/17/20   Gabriel Earing, PA-C    Physical Exam: Vitals:   06/26/20 1405 06/26/20 1444 06/26/20 1500 06/26/20 1530  BP:  (!) 143/86 (!) 152/85 (!) 151/87  Pulse:  66 65 67  Resp:  (!) 25 22 21   Temp:      TempSrc:      SpO2: 94%       Constitutional: Patient on BiPAP and undergoing dialysis, appears somnolent/tired Eyes: PERRL, lids and conjunctivae normal Neck: normal, supple, no masses, no thyromegaly Respiratory: Decreased breath sounds on right side, bilateral crackles noted on auscultation along upper lobes and right lower lobe.  Increased respiratory effort, on BiPAP.   Cardiovascular: Regular rate and rhythm, no murmurs /  rubs / gallops.  3+ pitting edema as well as chronic lymphedema in bilateral lower extremities  Abdomen: Obese, no tenderness,Bowel sounds positive.  Musculoskeletal: Moving all extremities, chronic lymphedema in lower extremities.  Neurologic: On BiPAP, somnolent, no facial droop, moving all extremities, could not test further.?  History of right hemiparesis Psychiatric: Currently on BiPAP and somnolent/lethargic, does not answer questions SKIN/catheters: Bilateral lower extremity lymphedema/stasis dermatitis  Labs on Admission: I have personally reviewed following labs and imaging studies  CBC: Recent Labs  Lab 06/26/20 0858  WBC 8.7  HGB 11.1*  HCT 36.1*  MCV 91.9  PLT 621   Basic Metabolic Panel: Recent Labs  Lab 06/26/20 0858  NA 138  K 5.0  CL 98  CO2 21*  GLUCOSE 97  BUN 56*  CREATININE 10.14*  CALCIUM 8.4*   GFR: CrCl cannot be calculated (Unknown ideal weight.). Recent Labs  Lab 06/26/20 0858 06/26/20 0943  WBC 8.7  --   LATICACIDVEN  --  1.5   Liver  Function Tests: Recent Labs  Lab 06/26/20 0858  AST 25  ALT 19  ALKPHOS 69  BILITOT 1.2  PROT 7.4  ALBUMIN 4.0   No results for input(s): LIPASE, AMYLASE in the last 168 hours. No results for input(s): AMMONIA in the last 168 hours. Coagulation Profile: No results for input(s): INR, PROTIME in the last 168 hours. Cardiac Enzymes: No results for input(s): CKTOTAL, CKMB, CKMBINDEX, TROPONINI in the last 168 hours. BNP (last 3 results) No results for input(s): PROBNP in the last 8760 hours. HbA1C: No results for input(s): HGBA1C in the last 72 hours. CBG: No results for input(s): GLUCAP in the last 168 hours. Lipid Profile: No results for input(s): CHOL, HDL, LDLCALC, TRIG, CHOLHDL, LDLDIRECT in the last 72 hours. Thyroid Function Tests: No results for input(s): TSH, T4TOTAL, FREET4, T3FREE, THYROIDAB in the last 72 hours. Anemia Panel: No results for input(s): VITAMINB12, FOLATE, FERRITIN, TIBC, IRON, RETICCTPCT in the last 72 hours. Urine analysis:    Component Value Date/Time   COLORURINE STRAW (A) 08/27/2018 2129   APPEARANCEUR CLEAR 08/27/2018 2129   LABSPEC 1.011 08/27/2018 2129   PHURINE 6.0 08/27/2018 2129   GLUCOSEU 50 (A) 08/27/2018 2129   HGBUR SMALL (A) 08/27/2018 2129   BILIRUBINUR NEGATIVE 08/27/2018 2129   KETONESUR 5 (A) 08/27/2018 2129   PROTEINUR >=300 (A) 08/27/2018 2129   NITRITE NEGATIVE 08/27/2018 2129   LEUKOCYTESUR NEGATIVE 08/27/2018 2129    Radiological Exams on Admission: Personally reviewed  DG Chest 2 View  Result Date: 06/26/2020 CLINICAL DATA:  Shortness of breath for 5 days.  Fever. EXAM: CHEST - 2 VIEW COMPARISON:  11/15/2019 FINDINGS: Left-sided dialysis catheter tip at superior caval/atrial junction. Patient rotated minimally left. Moderate cardiomegaly. Atherosclerosis in the transverse aorta. A large right pleural effusion. No left-sided pleural fluid. No pneumothorax. Suspect mild pulmonary venous congestion. Right lower and midlung  airspace disease is most likely compressive atelectasis. IMPRESSION: Large right pleural effusion with likely secondary compressive atelectasis of the inferior and mid right lung. Cardiomegaly with suspicion of mild pulmonary venous congestion. Aortic Atherosclerosis (ICD10-I70.0). Consider repeat plain films after thoracentesis to more entirely evaluate the right lung. Electronically Signed   By: Abigail Miyamoto M.D.   On: 06/26/2020 09:29   DG Chest Port 1 View  Result Date: 06/26/2020 CLINICAL DATA:  Post thoracentesis EXAM: PORTABLE CHEST 1 VIEW COMPARISON:  June 26, 2020, prior to thoracentesis FINDINGS: LEFT-sided vascular access device terminates at the caval to atrial junction. Marked decrease  in size of a RIGHT-sided pleural effusion since the previous evaluation. No pneumothorax. Persistent RIGHT basilar airspace disease. Vascular congestion. Cardiomediastinal contours accentuated by low lung volumes, unchanged from the previous exam. On limited assessment visualized skeletal structures without acute bone finding. IMPRESSION: 1. Marked decrease in size of a RIGHT-sided pleural effusion following thoracentesis. No pneumothorax is evident. 2. Persistent RIGHT basilar airspace disease. 3. Subtle LEFT basilar opacities likely atelectasis with diminished lung volume since the comparison evaluation. Electronically Signed   By: Zetta Bills M.D.   On: 06/26/2020 12:24    EKG: Independently reviewed.  Normal sinus rhythm, QTC 477 ms     Assessment and Plan:   Active Problems:   Pleural effusion   Acute respiratory failure with hypoxia (HCC)    1.  Right-sided pleural effusion with acute respiratory failure: Requiring 4 to 5 L O2 initially-now on BiPAP.  Will obtain ABG.  S/p bedside thoracentesis by PCCM and body fluid analysis pending.  Repeat chest x-ray does show improvement in pleural effusion and underlying consolidation. Unclear etiology.  Check echo as BNP greater than 4500, mild  elevation in troponin albeit in the setting of renal failure.  No significant ST-T changes on EKG.  Last echo in May 2020 showed preserved EF  2.  ESRD with anasarca: Patient with orthopnea and increased leg edema.  BNP elevated.  Nephrology consulted and undergoing dialysis for ultrafiltrate removal.  Usually on Monday Wednesday Friday schedule.Albumin level okay at 4.0.    3.  BPH: Resume home medications  4.  Hypertension: Resume home medications  5.  Anemia of chronic disease: Appears stable.  6.?  History of right hemiparesis/cognitive dysfunction: Unclear baseline.  Patient not communicating at this time.  Family not present at bedside.  Per dialysis nurse patient was communicating somewhat earlier.  Currently very somnolent but arousable and on BiPAP.  Will attempt to call family  DVT prophylaxis: Heparin  COVID screen: Negative  Code Status: Full code   .  Patient/Family Communication: Discussed with patient and all questions answered to satisfaction.  Consults called: PCCM, nephrology Admission status :I certify that at the point of admission it is my clinical judgment that the patient will require inpatient hospital care spanning beyond 2 midnights from the point of admission due to high intensity of service and high frequency of surveillance required.Inpatient status is judged to be reasonable and necessary in order to provide the required intensity of service to ensure the patient's safety. The patient's presenting symptoms, physical exam findings, and initial radiographic and laboratory data in the context of their chronic comorbidities is felt to place them at high risk for further clinical deterioration. The following factors support the patient status of inpatient : Acute respiratory failure requiring BiPAP therapy    Guilford Shi MD [facility] Pager in [place]  If 7PM-7AM, please contact night-coverage [email or web address]   06/26/2020, 4:19 PM

## 2020-06-27 ENCOUNTER — Inpatient Hospital Stay (HOSPITAL_COMMUNITY): Payer: 59

## 2020-06-27 DIAGNOSIS — I5020 Unspecified systolic (congestive) heart failure: Secondary | ICD-10-CM

## 2020-06-27 DIAGNOSIS — J9601 Acute respiratory failure with hypoxia: Secondary | ICD-10-CM

## 2020-06-27 DIAGNOSIS — I5031 Acute diastolic (congestive) heart failure: Secondary | ICD-10-CM

## 2020-06-27 HISTORY — DX: Unspecified systolic (congestive) heart failure: I50.20

## 2020-06-27 LAB — BASIC METABOLIC PANEL
Anion gap: 14 (ref 5–15)
BUN: 34 mg/dL — ABNORMAL HIGH (ref 8–23)
CO2: 25 mmol/L (ref 22–32)
Calcium: 8 mg/dL — ABNORMAL LOW (ref 8.9–10.3)
Chloride: 99 mmol/L (ref 98–111)
Creatinine, Ser: 6.63 mg/dL — ABNORMAL HIGH (ref 0.61–1.24)
GFR calc Af Amer: 9 mL/min — ABNORMAL LOW (ref >60)
GFR calc non Af Amer: 8 mL/min — ABNORMAL LOW (ref >60)
Glucose, Bld: 89 mg/dL (ref 70–99)
Potassium: 4.2 mmol/L (ref 3.5–5.1)
Sodium: 138 mmol/L (ref 135–145)

## 2020-06-27 LAB — CYTOLOGY - NON PAP

## 2020-06-27 LAB — POCT I-STAT 7, (LYTES, BLD GAS, ICA,H+H)
Acid-Base Excess: 1 mmol/L (ref 0.0–2.0)
Bicarbonate: 24.5 mmol/L (ref 20.0–28.0)
Calcium, Ion: 1 mmol/L — ABNORMAL LOW (ref 1.15–1.40)
HCT: 36 % — ABNORMAL LOW (ref 39.0–52.0)
Hemoglobin: 12.2 g/dL — ABNORMAL LOW (ref 13.0–17.0)
O2 Saturation: 90 %
Patient temperature: 99.1
Potassium: 4.3 mmol/L (ref 3.5–5.1)
Sodium: 136 mmol/L (ref 135–145)
TCO2: 26 mmol/L (ref 22–32)
pCO2 arterial: 36.6 mmHg (ref 32.0–48.0)
pH, Arterial: 7.435 (ref 7.350–7.450)
pO2, Arterial: 58 mmHg — ABNORMAL LOW (ref 83.0–108.0)

## 2020-06-27 LAB — LACTATE DEHYDROGENASE: LDH: 191 U/L (ref 98–192)

## 2020-06-27 LAB — ECHOCARDIOGRAM COMPLETE
Area-P 1/2: 7.02 cm2
Calc EF: 35.2 %
S' Lateral: 4.1 cm
Single Plane A2C EF: 39.5 %
Single Plane A4C EF: 30.8 %
Weight: 2931.24 oz

## 2020-06-27 LAB — PROTEIN, TOTAL: Total Protein: 6.6 g/dL (ref 6.5–8.1)

## 2020-06-27 MED ORDER — PRISMASOL BGK 4/2.5 32-4-2.5 MEQ/L REPLACEMENT SOLN
Status: DC
Start: 2020-06-27 — End: 2020-06-27

## 2020-06-27 MED ORDER — HEPARIN SODIUM (PORCINE) 1000 UNIT/ML DIALYSIS: 1000.0000 [IU] | INTRAMUSCULAR | Status: DC | PRN

## 2020-06-27 MED ORDER — HEPARIN SODIUM (PORCINE) 1000 UNIT/ML IJ SOLN
INTRAMUSCULAR | Status: AC
  Administered 2020-06-27: 2000 [IU]
  Filled 2020-06-27: qty 4

## 2020-06-27 MED ORDER — HEPARIN SODIUM (PORCINE) 1000 UNIT/ML IJ SOLN
INTRAMUSCULAR | Status: AC
  Administered 2020-06-27: 3400 [IU]
  Filled 2020-06-27: qty 2

## 2020-06-27 MED ORDER — POTASSIUM CHLORIDE 2 MEQ/ML IV SOLN
INTRAVENOUS | Status: DC
Start: 2020-06-27 — End: 2020-06-27

## 2020-06-27 MED ORDER — PRISMASOL BGK 4/2.5 32-4-2.5 MEQ/L IV SOLN
INTRAVENOUS | Status: DC
Start: 2020-06-27 — End: 2020-06-27

## 2020-06-27 MED ORDER — LORAZEPAM 2 MG/ML IJ SOLN
0.5000 mg | Freq: Four times a day (QID) | INTRAMUSCULAR | Status: DC | PRN
  Administered 2020-06-27 – 2020-06-28 (×2): 0.5 mg via INTRAVENOUS
  Filled 2020-06-27 (×3): qty 1

## 2020-06-27 NOTE — Progress Notes (Signed)
Pt distressed and SPO2 decreased. Low 80-high 70's Pt trying to get out of bed and yelling. Nonrebreathing placed with 15L.  Doctor and  rapid called and pt now on BiPAP and comfortable SpO2 98. Will continue to monitor.    Phoebe Sharps, RN

## 2020-06-27 NOTE — Progress Notes (Signed)
Patient ID: Devin Harmon, male   DOB: 1956/01/27, 64 y.o.   MRN: [id number]  [facility] Progress Note   Assessment/ Plan:   1. Acute hypoxic respiratory failure: Secondary to combination of pulmonary edema and large right pleural effusion stemming from his chronic nonadherence with prescribed hemodialysis and unrestricted interdialytic weight gain. He underwent thoracentesis yesterday and hemodialysis with need for emergent hemodialysis overnight for additional ultrafiltration; he is net -8.6 L with hemodialysis/thoracentesis. I will order for additional hemodialysis later today for volume unloading. 2. ESRD: On hemodialysis on a Monday/Wednesday/Friday schedule with pattern of chronic nonadherence with hemodialysis/truncated treatments and leaving over EDW. Discussed risks. 3. Anemia: Hemoglobin and hematocrit currently at goal, off ESA at this time. 4. CKD-MBD: Calcium level acceptable, will add phosphorus level to labs drawn earlier. We'll continue Auryxia for phosphorus binding with meals. Not on vitamin D receptor analog. 5. Nutrition: Nutrition limited by current respiratory status, monitor for aspiration. 6. Hypertension: Elevated blood pressure noted, resumed oral antihypertensive therapy and attempting to lower dry weight.  Subjective:   Reports to be feeling better compared to yesterday after dialysis and thoracentesis.   Objective:   BP (!) 167/81   Pulse 77   Temp 97.9 F (36.6 C) (Oral)   Resp (!) 30   Wt 83.1 kg   SpO2 97%   BMI 27.05 kg/m   Physical Exam: Gen: Appears to be comfortable resting in bed, propped up. Wife feeding him breakfast CVS: Pulse regular rhythm, normal rate, S1 and S2 normal Resp: Right side with distant breath sounds, left side coarse breath sounds. No distinct rhonchi. Left IJ TDC Abd: Soft, obese, nontender Ext: Chronic lymphedema with superimposed 2+ pitting edema. Left upper arm AV fistula with palpable  thrill.  Labs: BMET Recent Labs  Lab 06/26/20 0858 06/26/20 1907 06/26/20 2105 06/27/20 0500  NA 138  --  136 138  K 5.0  --  4.3 4.2  CL 98  --   --  99  CO2 21*  --   --  25  GLUCOSE 97  --   --  89  BUN 56*  --   --  34*  CREATININE 10.14* 6.79*  --  6.63*  CALCIUM 8.4*  --   --  8.0*   CBC Recent Labs  Lab 06/26/20 0858 06/26/20 1907 06/26/20 2105  WBC 8.7 10.9*  --   HGB 11.1* 11.4* 12.2*  HCT 36.1* 35.4* 36.0*  MCV 91.9 91.2  --   PLT 241 194  --      Medications:    . amLODipine  10 mg Oral Daily  . Chlorhexidine Gluconate Cloth  6 each Topical Q0600  . ferric citrate  420 mg Oral TID WC  . heparin  5,000 Units Subcutaneous Q8H  . labetalol  100 mg Oral BID  . multivitamin  1 tablet Oral Daily  . sodium chloride flush  3 mL Intravenous Once   Elmarie Shiley, MD 06/27/2020, 9:16 AM

## 2020-06-27 NOTE — Treatment Plan (Addendum)
Informed of patient's current clinical status.  Increasing O2 requirements now requiring BiPAP.  Volume overloaded despite aggressive ultrafiltration on hemodialysis earlier today.  Giving staffing issues and dialysis schedule, I do believe the safest way to offload fluid rather than waiting is to maintain him on CRRT for now with the goal to make him net negative at least 159mL/hr (can even aim for net negative up to 134mL/hr).  He already has a catheter in place that we can utilize for this.  Discussed with primary service with aforementioned plan. Will be set up for critical care placement for this. CVVHDF orders placed.  ADDENDUM 5:16pm 06/27/2020: discussed with pulmonology/crit care. Likely reexpansion edema s/p thora. Hold off on crrt (orders d/c'ed), needs bipap overall. Will proceed with IHD tomorrow (discussed with staff, will aim for AM treatment). If clinical status worsen, please call, will proceed with CRRT if needed.  Gean Quint, MD [facility]

## 2020-06-27 NOTE — Progress Notes (Signed)
PROGRESS NOTE        PATIENT DETAILS Name: Devin Harmon Age: 64 y.o. Sex: male Date of Birth: 1956-11-07 Admit Date: 06/26/2020 Admitting Physician Guilford Shi, MD ELF:[id number], Theophilus Kinds, MD  Brief Narrative: Patient is a 64 y.o. male with history of ESRD on HD MWF (per family-frequently cuts short dialysis sessions)-presented with shortness of breath, lower extremity edema-was found to have acute hypoxic respiratory failure secondary to volume overload/pleural effusion.  He was seen by PCCM-underwent thoracocentesis-and was seen by nephrology and underwent urgent HD.  See below for further details.    Significant events: 7/27>> admit to [facility] for S OB/volume overload/pleural effusion-briefly required BiPAP on admission.  Significant studies: 7/27>> chest x-ray: Large right-sided pleural effusion, mild pulmonary venous congestion  Antimicrobial therapy: None  Microbiology data: 7/27>> blood culture: Pending  Procedures : 7/27>> thoracocentesis  Consults: Nephrology, pulmonology  DVT Prophylaxis : heparin injection 5,000 Units Start: 06/26/20 1400  Subjective: Underwent HD and thoracocentesis yesterday with clinical improvement-but overnight again developed worsening shortness of breath-repeat chest x-ray showed reaccumulation of pleural effusion.  Underwent emergent HD last night.  Assessment/Plan: Acute hypoxic respiratory failure: Secondary to volume overload/pulmonary edema and large right-sided pleural effusion due to underdialysis (per family-frequently cuts short dialysis sessions).  S/p thoracocentesis yesterday-suspect will turn out to be a transudate-await further studies.  Underwent HD x2 (1 yesterday and then overnight)-nephrology planning another HD session today-as he still remains volume overloaded and hypoxic.  Continue supportive care-continue oxygen via HFNC-I titrated down to around 10-12L when I was in the room-continue to attempt  to titrate down FiO2 as tolerated.  ESRD: Continues to have volume overload-still with 2+ pitting edema although spouse at bedside acknowledges significant improvement.  Nephrology following-another HD planned for later today.  Anemia of chronic disease: Hemoglobin stable-no indication for transfusion-follow  HTN: BP stable-continue amlodipine/labetalol-should improve further with more HD sessions.  History of mild right-sided hemiparesis/dysarthria from prior CVA: Per spouse at bedside on 7/28-he is currently at baseline in terms of his speech and right-sided weakness.  Given acute illness-may have more debility-we will obtain PT/OT.   Diet: Diet Order            Diet Heart Room service appropriate? Yes; Fluid consistency: Thin  Diet effective now                  Code Status: Full code   Family Communication: Spouse at bedside  Disposition Plan: Status is: Inpatient  Remains inpatient appropriate because:Inpatient level of care appropriate due to severity of illness   Dispo: The patient is from: Home              Anticipated d/c is to: Home              Anticipated d/c date is: 3 days              Patient currently is not medically stable to d/c.   Barriers to Discharge: Hypoxia requiring high flow oxygen-to maintain O2 saturations due to pleural effusion/pulmonary edema in the setting of anasarca due to under dialysis.  Nephrology planning another round of HD today.  Antimicrobial agents: Anti-infectives (From admission, onward)   Start     Dose/Rate Route Frequency Ordered Stop   06/26/20 1000  cefTRIAXone (ROCEPHIN) 1 g in sodium chloride 0.9 % 100 mL IVPB  1 g 200 mL/hr over 30 Minutes Intravenous  Once 06/26/20 0947 06/26/20 1106   06/26/20 1000  azithromycin (ZITHROMAX) 500 mg in sodium chloride 0.9 % 250 mL IVPB  Status:  Discontinued        500 mg 250 mL/hr over 60 Minutes Intravenous  Once 06/26/20 0947 06/26/20 1228       Time spent: 25-  minutes-Greater than 50% of this time was spent in counseling, explanation of diagnosis, planning of further management, and coordination of care.  MEDICATIONS: Scheduled Meds: . amLODipine  10 mg Oral Daily  . Chlorhexidine Gluconate Cloth  6 each Topical Q0600  . ferric citrate  420 mg Oral TID WC  . heparin  5,000 Units Subcutaneous Q8H  . labetalol  100 mg Oral BID  . multivitamin  1 tablet Oral Daily  . sodium chloride flush  3 mL Intravenous Once   Continuous Infusions: PRN Meds:.acetaminophen, albuterol, fluticasone, hydrALAZINE, loratadine, ondansetron **OR** ondansetron (ZOFRAN) IV, polyethylene glycol   PHYSICAL EXAM: Vital signs: Vitals:   06/27/20 0700 06/27/20 0730 06/27/20 0800 06/27/20 1100  BP: (!) 156/87 (!) 169/81 (!) 167/81 (!) 166/80  Pulse: 74 69 77 66  Resp: (!) 28 21 (!) 30 20  Temp:   97.9 F (36.6 C)   TempSrc:   Oral   SpO2: 100% 100% 97% 99%  Weight:   83.1 kg    Filed Weights   06/27/20 0330 06/27/20 0800  Weight: 88.4 kg 83.1 kg   Body mass index is 27.05 kg/m.   Gen Exam: Awake/alert-dysarthric at baseline (per spouse at bedside).  Not in any distress. HEENT:atraumatic, normocephalic Chest: decreased air movement on the right compared to the left-but otherwise clear to auscultation. CVS:S1S2 regular Abdomen:soft non tender, non distended Extremities:++ edema Neurology: Non focal Skin: no rash  I have personally reviewed following labs and imaging studies  LABORATORY DATA: CBC: Recent Labs  Lab 06/26/20 0858 06/26/20 1907 06/26/20 2105  WBC 8.7 10.9*  --   HGB 11.1* 11.4* 12.2*  HCT 36.1* 35.4* 36.0*  MCV 91.9 91.2  --   PLT 241 194  --     Basic Metabolic Panel: Recent Labs  Lab 06/26/20 0858 06/26/20 1907 06/26/20 2105 06/27/20 0500  NA 138  --  136 138  K 5.0  --  4.3 4.2  CL 98  --   --  99  CO2 21*  --   --  25  GLUCOSE 97  --   --  89  BUN 56*  --   --  34*  CREATININE 10.14* 6.79*  --  6.63*  CALCIUM 8.4*   --   --  8.0*    GFR: Estimated Creatinine Clearance: 11.1 mL/min (A) (by C-G formula based on SCr of 6.63 mg/dL (H)).  Liver Function Tests: Recent Labs  Lab 06/26/20 0858  AST 25  ALT 19  ALKPHOS 69  BILITOT 1.2  PROT 7.4  ALBUMIN 4.0   No results for input(s): LIPASE, AMYLASE in the last 168 hours. No results for input(s): AMMONIA in the last 168 hours.  Coagulation Profile: No results for input(s): INR, PROTIME in the last 168 hours.  Cardiac Enzymes: No results for input(s): CKTOTAL, CKMB, CKMBINDEX, TROPONINI in the last 168 hours.  BNP (last 3 results) No results for input(s): PROBNP in the last 8760 hours.  Lipid Profile: No results for input(s): CHOL, HDL, LDLCALC, TRIG, CHOLHDL, LDLDIRECT in the last 72 hours.  Thyroid Function Tests: No results for input(s):  TSH, T4TOTAL, FREET4, T3FREE, THYROIDAB in the last 72 hours.  Anemia Panel: No results for input(s): VITAMINB12, FOLATE, FERRITIN, TIBC, IRON, RETICCTPCT in the last 72 hours.  Urine analysis:    Component Value Date/Time   COLORURINE STRAW (A) 08/27/2018 2129   APPEARANCEUR CLEAR 08/27/2018 2129   LABSPEC 1.011 08/27/2018 2129   PHURINE 6.0 08/27/2018 2129   GLUCOSEU 50 (A) 08/27/2018 2129   HGBUR SMALL (A) 08/27/2018 2129   BILIRUBINUR NEGATIVE 08/27/2018 2129   KETONESUR 5 (A) 08/27/2018 2129   PROTEINUR >=300 (A) 08/27/2018 2129   NITRITE NEGATIVE 08/27/2018 2129   LEUKOCYTESUR NEGATIVE 08/27/2018 2129    Sepsis Labs: Lactic Acid, Venous    Component Value Date/Time   LATICACIDVEN 1.3 06/26/2020 1907    MICROBIOLOGY: Recent Results (from the past 240 hour(s))  SARS Coronavirus 2 by RT PCR     Status: None   Collection Time: 06/26/20  9:53 AM  Result Value Ref Range Status   SARS Coronavirus 2 NEGATIVE NEGATIVE Final    Comment: (NOTE) Result indicates the ABSENCE of SARS-CoV-2 RNA in the patient specimen.   The lowest concentration of SARS-CoV-2 viral copies this assay  can detect in nasopharyngeal swab specimens is 500 copies / mL.  A negative result does not preclude SARS-CoV-2 infection and should not be used as the sole basis for patient management decisions. A negative result may occur with improper specimen collection / handling, submission of a specimen other than nasopharyngeal swab, presence of viral mutation(s) within the areas targeted by this assay, and inadequate number of viral copies (<500 copies / mL) present.  Negative results must be combined with clinical observations, patient history, and epidemiological information.   The expected result is NEGATIVE.   Patient Fact Sheet:  BlogSelections.co.uk    Provider Fact Sheet:  https://lucas.com/    This test is not yet approved or cleared by the [place] FDA and  has been [place] orized for detection and/or diagnosis of SARS-CoV-2 by FDA under an Emergency Use Authorization (EUA).  This EUA will remain in effect (meaning this test can be used) for the duration of  the COVID-19 declaration under Section 564(b)(1) of the Act, 21 U.S.C. section 360bbb-3(b)(1), unless the authorization is terminated or revoked sooner  Performed at [facility], [place] [address], [place], [place] [address]   Body fluid culture     Status: None (Preliminary result)   Collection Time: 06/26/20 12:31 PM   Specimen: Pleura; Body Fluid  Result Value Ref Range Status   Specimen Description PLEURAL FLUID RIGHT LUNG  Final   Special Requests NONE  Final   Gram Stain   Final    RARE WBC PRESENT, PREDOMINANTLY MONONUCLEAR NO ORGANISMS SEEN Performed at [facility], [address], [place], [place] [address]    Culture PENDING  Incomplete   Report Status PENDING  Incomplete    RADIOLOGY STUDIES/RESULTS: DG Chest 2 View  Result Date: 06/26/2020 CLINICAL DATA:  Shortness of breath for 5 days.  Fever. EXAM: CHEST - 2 VIEW COMPARISON:  11/15/2019  FINDINGS: Left-sided dialysis catheter tip at superior caval/atrial junction. Patient rotated minimally left. Moderate cardiomegaly. Atherosclerosis in the transverse aorta. A large right pleural effusion. No left-sided pleural fluid. No pneumothorax. Suspect mild pulmonary venous congestion. Right lower and midlung airspace disease is most likely compressive atelectasis. IMPRESSION: Large right pleural effusion with likely secondary compressive atelectasis of the inferior and mid right lung. Cardiomegaly with suspicion of mild pulmonary venous congestion.  Aortic Atherosclerosis (ICD10-I70.0). Consider repeat plain films after thoracentesis to more entirely evaluate the right lung. Electronically Signed   By: Abigail Miyamoto M.D.   On: 06/26/2020 09:29   DG CHEST PORT 1 VIEW  Result Date: 06/26/2020 CLINICAL DATA:  Dyspnea. EXAM: PORTABLE CHEST 1 VIEW COMPARISON:  Radiograph earlier this day. FINDINGS: Progressive right pleural effusion since exam earlier this day, now occupying the lower 1/2 of right hemithorax. Increasing opacity throughout the right hemithorax likely due to layering effusion and atelectasis. Left-sided dialysis catheter remains in place. Cardiomegaly is stable. Left costophrenic angle is not included in the field of view limiting assessment for left pleural effusion. There is no visualized pneumothorax. IMPRESSION: 1. Progressive right pleural effusion since exam earlier this day, now occupying the lower 1/2 of right hemithorax. 2. Increasing opacity throughout the right hemithorax likely due to layering effusion and atelectasis. 3. Stable cardiomegaly. Previous left lung base opacity not well assessed on the current exam given positioning. Electronically Signed   By: Keith Rake M.D.   On: 06/26/2020 21:31   DG Chest Port 1 View  Result Date: 06/26/2020 CLINICAL DATA:  Post thoracentesis EXAM: PORTABLE CHEST 1 VIEW COMPARISON:  June 26, 2020, prior to thoracentesis FINDINGS: LEFT-sided  vascular access device terminates at the caval to atrial junction. Marked decrease in size of a RIGHT-sided pleural effusion since the previous evaluation. No pneumothorax. Persistent RIGHT basilar airspace disease. Vascular congestion. Cardiomediastinal contours accentuated by low lung volumes, unchanged from the previous exam. On limited assessment visualized skeletal structures without acute bone finding. IMPRESSION: 1. Marked decrease in size of a RIGHT-sided pleural effusion following thoracentesis. No pneumothorax is evident. 2. Persistent RIGHT basilar airspace disease. 3. Subtle LEFT basilar opacities likely atelectasis with diminished lung volume since the comparison evaluation. Electronically Signed   By: Zetta Bills M.D.   On: 06/26/2020 12:24     LOS: 1 day   Oren Binet, MD  [facility]    To contact the attending provider between 7A-7P or the covering provider during after hours 7P-7A, please log into the web site [email or web address] and access using universal [place] password for that web site. If you do not have the password, please call the hospital operator.  06/27/2020, 11:06 AM

## 2020-06-27 NOTE — Progress Notes (Addendum)
NAME:  Devin Harmon, MRN:  [id number], DOB:  October 20, 1956, LOS: 1 ADMISSION DATE:  06/26/2020, CONSULTATION DATE:  06/26/20 REFERRING MD:  Ronnald Nian, CHIEF COMPLAINT:  Shortness of breath  Brief History   64yom presenting with acute respiratory failure 2/2 a large right pleural effusion and pulm edema that is attributable to hypervolemic state in the setting of missed HD.   History of present illness   46 yom with ESRD on HD who presented to [facility] on 06/26/20 for 2d history of progressive shortness of breath. Wife is primary historian due to pt being on BiPAP.   She notes that he missed HD on Friday due to diarrhea. Since that time, he developed progressive shortness of breath and loss of appetite. He was able to make it to HD on day prior to admission, however it is unclear how much fluid was removed. Over the same time period, he additionally developed orthopnea which he does not have at baseline.  Also of note, his wife explains that he recently changed HD centers and has developed worsening LE edema since then. Denies recent fevers, chills or respiratory symptoms other than the shortness of breath.   Past Medical History  ESRD on HD, hypertension  Significant Hospital Events   7/27 admission; thoracentesis  Consults:  PCCM Nephrology  Procedures:  Right thoracentesis 7/27>>2334mL clear yellow fluid  Significant Diagnostic Tests:  7/27 CXR>> pulm edema with large right pleural effusion. Cardiomegaly. Bibasilar atelectasis  Micro Data:  COVID neg Pleural fluid cultures>>  Antimicrobials:  n/a  Interim history/subjective:   Underwent thoracentesis yesterday. Underwent dialysis completed treatment around 1700 Developed respiratory distress again, remained hypoxic 15 L. Repeat chest x-ray shows reaccumulation of pleural effusion on the right This a.m. down to 10 L, resting, wife at bedside Afebrile   Objective   Blood pressure (!) 167/81, pulse 77, temperature 97.9 F (36.6  C), temperature source Oral, resp. rate (!) 30, weight 83.1 kg, SpO2 97 %.        Intake/Output Summary (Last 24 hours) at 06/27/2020 1055 Last data filed at 06/27/2020 0800 Gross per 24 hour  Intake 350 ml  Output 9000 ml  Net -8650 ml   Filed Weights   06/27/20 0330 06/27/20 0800  Weight: 88.4 kg 83.1 kg    Examination: General: acutely ill appearing male HENT: On high flow nasal cannula, mild accessory muscle use Lungs: Decreased breath sounds on right, left permacath Cardiovascular: RRR. JVD to the angle of the mandible. +2 edema to lower extremities Abdomen: firm, moderately distended. Not painful to palpation Extremities: warm , shrivelled Neuro: a/o x3 Skin: no rash  Resolved Hospital Problem list   n/a  Assessment & Plan:   Acute hypoxic respiratory failure requiring NIPPV secondary to pulm edema and bilateral pleural effusions--suspect transudative. Attributable to hypervolemic state in setting of missed HD sessions. S/P right thoracentesis 7/27--2349mL removed >> fluid shows high protein but low LDH very few cells, borderline exudate  Fluid has reaccumulated >> this may be related to reexpansion edema.  He required transient BiPAP some concern for SVC syndrome given dilated veins in upper chest  Plan Discussed with renal, they will hemodialyze again , we will repeat chest x-ray tomorrow Discussed Pleurx catheter with wife at bedside, they would like to avoid unless absolutely necessary Continue to lower FiO2 on high flow nasal cannula as tolerated, goal saturation 92% and above, BiPAP for WOB may also help with re expansion edema Obtain serum protein and LDH to clarify, and fluid  cytology but malignancy does not seem to be a consideration here  PCCM to follow  Kara Mead MD. FCCP. [facility]  If no response to pager , please call [phone]   06/27/2020

## 2020-06-27 NOTE — Progress Notes (Signed)
  Echocardiogram 2D Echocardiogram has been performed.  Darlina Sicilian M 06/27/2020, 9:02 AM

## 2020-06-27 NOTE — Significant Event (Addendum)
Rapid Response Event Note  Overview: Time Called: 1634 Arrival Time: 1636 Event Type: Respiratory Per staff patient with sudden acute onset of respiratory distress and agitation.  He pulled off his oxygen  Initial Focused Assessment: Upon my arrival patient on NRB mild increase WOB and now calm.   BP 161/82  HR 73  O2 sat 92% on NRB RR 34 Lung sounds decreased  Interventions: Placed patient on BIpap Fio2 60% Patient resting comfortably on Bipap BP 134/76  HR 64  RR 25  O2 sat 98%  Dr Sloan Leiter at [place] (if not transferred): Rn to call if patient has additional respiratory distress  Event Summary:   at      at       Event End Time: [place]  Raliegh Ip

## 2020-06-27 NOTE — Progress Notes (Signed)
Back on Bipap-comfortable. Discussed with Dr Elsworth Soho PCCM and Dr Arsenio Loader are to continue Bipap-as thought is that this is re-expansion edema. If worsens then will need to consult Renal for CRRT.

## 2020-06-27 NOTE — Progress Notes (Signed)
Entered patient's room to find patient sitting on the side of the bed, very unstable.  Help patient back to bed.  Patient in resp. Distress and with increased WOB.  JVD noted.  Bilat breath sounds diminished throughout with right side greater than the left. Patient will not keep Bipap or NRB on.  RR 40's   BP 194/90 O2 sats 90%.  Placed on HFNC at [place].  [facility] and CCM came to see patient.  PCXR, ABG, Breathing tx and Ativan ordered for patient. Patient to have HD tonight/early morning.  Patient much calmer after Ativan given and resting with eyes closed. RR 30's and BP 150s/80s.  Patient's wife informed of patient status and all of her questions answered.

## 2020-06-28 ENCOUNTER — Inpatient Hospital Stay (HOSPITAL_COMMUNITY): Payer: 59

## 2020-06-28 DIAGNOSIS — E8770 Fluid overload, unspecified: Secondary | ICD-10-CM

## 2020-06-28 DIAGNOSIS — J9600 Acute respiratory failure, unspecified whether with hypoxia or hypercapnia: Secondary | ICD-10-CM | POA: Diagnosis not present

## 2020-06-28 DIAGNOSIS — N186 End stage renal disease: Secondary | ICD-10-CM

## 2020-06-28 DIAGNOSIS — I5023 Acute on chronic systolic (congestive) heart failure: Secondary | ICD-10-CM

## 2020-06-28 DIAGNOSIS — J90 Pleural effusion, not elsewhere classified: Secondary | ICD-10-CM | POA: Diagnosis not present

## 2020-06-28 LAB — RENAL FUNCTION PANEL
Albumin: 3.3 g/dL — ABNORMAL LOW (ref 3.5–5.0)
Albumin: 3.3 g/dL — ABNORMAL LOW (ref 3.5–5.0)
Anion gap: 13 (ref 5–15)
Anion gap: 15 (ref 5–15)
BUN: 30 mg/dL — ABNORMAL HIGH (ref 8–23)
BUN: 30 mg/dL — ABNORMAL HIGH (ref 8–23)
CO2: 27 mmol/L (ref 22–32)
CO2: 23 mmol/L (ref 22–32)
Calcium: 8 mg/dL — ABNORMAL LOW (ref 8.9–10.3)
Calcium: 7.9 mg/dL — ABNORMAL LOW (ref 8.9–10.3)
Chloride: 98 mmol/L (ref 98–111)
Chloride: 99 mmol/L (ref 98–111)
Creatinine, Ser: 6.09 mg/dL — ABNORMAL HIGH (ref 0.61–1.24)
Creatinine, Ser: 6.04 mg/dL — ABNORMAL HIGH (ref 0.61–1.24)
GFR calc Af Amer: 10 mL/min — ABNORMAL LOW (ref >60)
GFR calc Af Amer: 10 mL/min — ABNORMAL LOW (ref >60)
GFR calc non Af Amer: 9 mL/min — ABNORMAL LOW (ref >60)
GFR calc non Af Amer: 9 mL/min — ABNORMAL LOW (ref >60)
Glucose, Bld: 103 mg/dL — ABNORMAL HIGH (ref 70–99)
Glucose, Bld: 94 mg/dL (ref 70–99)
Phosphorus: 6.1 mg/dL — ABNORMAL HIGH (ref 2.5–4.6)
Phosphorus: 6.3 mg/dL — ABNORMAL HIGH (ref 2.5–4.6)
Potassium: 4 mmol/L (ref 3.5–5.1)
Potassium: 4 mmol/L (ref 3.5–5.1)
Sodium: 138 mmol/L (ref 135–145)
Sodium: 137 mmol/L (ref 135–145)

## 2020-06-28 LAB — CBC
HCT: 36.1 % — ABNORMAL LOW (ref 39.0–52.0)
Hemoglobin: 11 g/dL — ABNORMAL LOW (ref 13.0–17.0)
MCH: 28.3 pg (ref 26.0–34.0)
MCHC: 30.5 g/dL (ref 30.0–36.0)
MCV: 92.8 fL (ref 80.0–100.0)
Platelets: 195 10*3/uL (ref 150–400)
RBC: 3.89 MIL/uL — ABNORMAL LOW (ref 4.22–5.81)
RDW: 19.4 % — ABNORMAL HIGH (ref 11.5–15.5)
WBC: 8 10*3/uL (ref 4.0–10.5)
nRBC: 0.4 % — ABNORMAL HIGH (ref 0.0–0.2)

## 2020-06-28 LAB — MAGNESIUM: Magnesium: 2.2 mg/dL (ref 1.7–2.4)

## 2020-06-28 MED ORDER — HEPARIN SODIUM (PORCINE) 1000 UNIT/ML IJ SOLN
INTRAMUSCULAR | Status: AC
  Administered 2020-06-28: 3400 [IU]
  Filled 2020-06-28: qty 4

## 2020-06-28 NOTE — Progress Notes (Addendum)
PROGRESS NOTE        PATIENT DETAILS Name: Devin Harmon Age: 64 y.o. Sex: male Date of Birth: [birth year] Admit Date: 06/26/2020 Admitting Physician Guilford Shi, MD [id number], Theophilus Kinds, MD  Brief Narrative: Patient is a 64 y.o. male with history of ESRD on HD MWF (per family-frequently cuts short dialysis sessions)-presented with shortness of breath, lower extremity edema-was found to have acute hypoxic respiratory failure secondary to volume overload/pleural effusion.  He was seen by PCCM-underwent thoracocentesis-and was seen by nephrology and underwent urgent HD.  See below for further details.    Significant events: 7/27>> admit to [facility] for S OB/volume overload/pleural effusion-briefly required BiPAP on admission.  Significant studies: 7/27>> chest x-ray: Large right-sided pleural effusion, mild pulmonary venous congestion  Antimicrobial therapy: None  Microbiology data: 7/27>> blood culture: Pending  Procedures : 7/27>> thoracocentesis 7/29>> right-sided chest tube by PCCM  Consults: Nephrology, pulmonology  DVT Prophylaxis : heparin injection 5,000 Units Start: 06/26/20 1400  Subjective: Became hypoxic yesterday afternoon-requiring BiPAP placement-was comfortable-but then overnight-became intolerant to BiPAP and was placed back on HFNC.  When seen earlier this morning-somewhat lethargic  Assessment/Plan: Acute hypoxic respiratory failure: Secondary to volume overload/pulmonary edema and large right-sided pleural effusion due to underdialysis (per family-frequently cuts short dialysis sessions).  Respiratory failure improved post thoracocentesis-and HD x2-only to worsen yesterday.  Underwent another round of HD this morning-subsequently PCCM placed chest tube-we will follow post chest tube placement.  Continue supportive care-and attempt to wean down FiO2 as tolerated.  ESRD: Still with some lower extremity edema-HD x 3rd session since  admission this morning.  Nephrology following and directing care.  Anemia of chronic disease: Hemoglobin stable-no indication for transfusion-follow  HTN: BP controlled-continue amlodipine/labetalol  History of mild right-sided hemiparesis/dysarthria from prior CVA: Per spouse at bedside on 7/28-he is currently at baseline in terms of his speech and right-sided weakness.  Given acute illness-may have more debility-we will obtain PT/OT.   Diet: Diet Order            Diet Heart Room service appropriate? Yes with Assist; Fluid consistency: Thin  Diet effective now                  Code Status: Full code   Family Communication: Spouse at bedside on 7/28-we will talk to family over the next few days.  Disposition Plan: Status is: Inpatient  Remains inpatient appropriate because:Inpatient level of care appropriate due to severity of illness   Dispo: The patient is from: Home              Anticipated d/c is to: Home              Anticipated d/c date is: 3 days              Patient currently is not medically stable to d/c.   Barriers to Discharge: Hypoxia requiring HFNC-PCCM patient chest tube-still volume overloaded requiring third dialysis today  Antimicrobial agents: Anti-infectives (From admission, onward)   Start     Dose/Rate Route Frequency Ordered Stop   06/26/20 1000  cefTRIAXone (ROCEPHIN) 1 g in sodium chloride 0.9 % 100 mL IVPB        1 g 200 mL/hr over 30 Minutes Intravenous  Once 06/26/20 0947 06/26/20 1106   06/26/20 1000  azithromycin (ZITHROMAX) 500 mg in sodium chloride 0.9 %  250 mL IVPB  Status:  Discontinued        500 mg 250 mL/hr over 60 Minutes Intravenous  Once 06/26/20 0947 06/26/20 1228       Time spent: 25- minutes-Greater than 50% of this time was spent in counseling, explanation of diagnosis, planning of further management, and coordination of care.  MEDICATIONS: Scheduled Meds: . amLODipine  10 mg Oral Daily  . Chlorhexidine Gluconate  Cloth  6 each Topical Q0600  . ferric citrate  420 mg Oral TID WC  . heparin  5,000 Units Subcutaneous Q8H  . labetalol  100 mg Oral BID  . multivitamin  1 tablet Oral Daily  . sodium chloride flush  3 mL Intravenous Once   Continuous Infusions: PRN Meds:.acetaminophen, albuterol, fluticasone, hydrALAZINE, loratadine, LORazepam, ondansetron **OR** ondansetron (ZOFRAN) IV, polyethylene glycol   PHYSICAL EXAM: Vital signs: Vitals:   06/28/20 1100 06/28/20 1101 06/28/20 1116 06/28/20 1130  BP: (!) 141/75 (!) 141/75 (!) 137/77   Pulse: 55 55 61 61  Resp:  22    Temp:  98.3 F (36.8 C)    TempSrc:  Axillary    SpO2: 100% 100% 100% 100%  Weight:       Filed Weights   06/27/20 0800 06/28/20 0441 06/28/20 0657  Weight: 83.1 kg 80.6 kg 82.8 kg   Body mass index is 26.96 kg/m.   Gen Exam: Lethargic-but opens eyes to loud verbal stimuli. HEENT:atraumatic, normocephalic Chest: Decreased air entry on the right-no added sounds. CVS:S1S2 regular Abdomen:soft non tender, non distended Extremities:++ edema Neurology: Non focal-but with generalized weakness. Skin: no rash  I have personally reviewed following labs and imaging studies  LABORATORY DATA: CBC: Recent Labs  Lab 06/26/20 0858 06/26/20 1907 06/26/20 2105 06/28/20 0543  WBC 8.7 10.9*  --  8.0  HGB 11.1* 11.4* 12.2* 11.0*  HCT 36.1* 35.4* 36.0* 36.1*  MCV 91.9 91.2  --  92.8  PLT 241 194  --  616    Basic Metabolic Panel: Recent Labs  Lab 06/26/20 0858 06/26/20 1907 06/26/20 2105 06/27/20 0500 06/28/20 0543 06/28/20 0909  NA 138  --  136 138 138 137  K 5.0  --  4.3 4.2 4.0 4.0  CL 98  --   --  99 98 99  CO2 21*  --   --  25 27 23   GLUCOSE 97  --   --  89 103* 94  BUN 56*  --   --  34* 30* 30*  CREATININE 10.14* 6.79*  --  6.63* 6.09* 6.04*  CALCIUM 8.4*  --   --  8.0* 8.0* 7.9*  MG  --   --   --   --  2.2  --   PHOS  --   --   --   --  6.1* 6.3*    GFR: Estimated Creatinine Clearance: 12.2 mL/min  (A) (by C-G formula based on SCr of 6.04 mg/dL (H)).  Liver Function Tests: Recent Labs  Lab 06/26/20 0858 06/27/20 1128 06/28/20 0543 06/28/20 0909  AST 25  --   --   --   ALT 19  --   --   --   ALKPHOS 69  --   --   --   BILITOT 1.2  --   --   --   PROT 7.4 6.6  --   --   ALBUMIN 4.0  --  3.3* 3.3*   No results for input(s): LIPASE, AMYLASE in the last 168 hours. No  results for input(s): AMMONIA in the last 168 hours.  Coagulation Profile: No results for input(s): INR, PROTIME in the last 168 hours.  Cardiac Enzymes: No results for input(s): CKTOTAL, CKMB, CKMBINDEX, TROPONINI in the last 168 hours.  BNP (last 3 results) No results for input(s): PROBNP in the last 8760 hours.  Lipid Profile: No results for input(s): CHOL, HDL, LDLCALC, TRIG, CHOLHDL, LDLDIRECT in the last 72 hours.  Thyroid Function Tests: No results for input(s): TSH, T4TOTAL, FREET4, T3FREE, THYROIDAB in the last 72 hours.  Anemia Panel: No results for input(s): VITAMINB12, FOLATE, FERRITIN, TIBC, IRON, RETICCTPCT in the last 72 hours.  Urine analysis:    Component Value Date/Time   COLORURINE STRAW (A) 08/27/2018 2129   APPEARANCEUR CLEAR 08/27/2018 2129   LABSPEC 1.011 08/27/2018 2129   PHURINE 6.0 08/27/2018 2129   GLUCOSEU 50 (A) 08/27/2018 2129   HGBUR SMALL (A) 08/27/2018 2129   BILIRUBINUR NEGATIVE 08/27/2018 2129   KETONESUR 5 (A) 08/27/2018 2129   PROTEINUR >=300 (A) 08/27/2018 2129   NITRITE NEGATIVE 08/27/2018 2129   LEUKOCYTESUR NEGATIVE 08/27/2018 2129    Sepsis Labs: Lactic Acid, Venous    Component Value Date/Time   LATICACIDVEN 1.3 06/26/2020 1907    MICROBIOLOGY: Recent Results (from the past 240 hour(s))  Blood culture (routine x 2)     Status: None (Preliminary result)   Collection Time: 06/26/20  9:43 AM   Specimen: BLOOD RIGHT FOREARM  Result Value Ref Range Status   Specimen Description BLOOD RIGHT FOREARM  Final   Special Requests   Final    BOTTLES DRAWN  AEROBIC AND ANAEROBIC Blood Culture adequate volume   Culture   Final    NO GROWTH 2 DAYS Performed at [facility], [address], [place], [place] [address]    Report Status PENDING  Incomplete  Blood culture (routine x 2)     Status: None (Preliminary result)   Collection Time: 06/26/20  9:48 AM   Specimen: BLOOD  Result Value Ref Range Status   Specimen Description BLOOD RIGHT ANTECUBITAL  Final   Special Requests   Final    BOTTLES DRAWN AEROBIC AND ANAEROBIC Blood Culture adequate volume   Culture   Final    NO GROWTH 2 DAYS Performed at [facility], [place] [address], [place], [place] [address]    Report Status PENDING  Incomplete  SARS Coronavirus 2 by RT PCR     Status: None   Collection Time: 06/26/20  9:53 AM  Result Value Ref Range Status   SARS Coronavirus 2 NEGATIVE NEGATIVE Final    Comment: (NOTE) Result indicates the ABSENCE of SARS-CoV-2 RNA in the patient specimen.   The lowest concentration of SARS-CoV-2 viral copies this assay can detect in nasopharyngeal swab specimens is 500 copies / mL.  A negative result does not preclude SARS-CoV-2 infection and should not be used as the sole basis for patient management decisions. A negative result may occur with improper specimen collection / handling, submission of a specimen other than nasopharyngeal swab, presence of viral mutation(s) within the areas targeted by this assay, and inadequate number of viral copies (<500 copies / mL) present.  Negative results must be combined with clinical observations, patient history, and epidemiological information.   The expected result is NEGATIVE.   Patient Fact Sheet:  BlogSelections.co.uk    Provider Fact Sheet:  https://lucas.com/    This test is not yet approved or cleared by the [place] FDA and  has been [place] orized for detection and/or diagnosis of SARS-CoV-2 by FDA under an Emergency Use Authorization  (EUA).  This EUA will remain in effect (meaning this test can be used) for the duration of  the COVID-19 declaration under Section 564(b)(1) of the Act, 21 U.S.C. section 360bbb-3(b)(1), unless the authorization is terminated or revoked sooner  Performed at [facility], [place] [address] [place], [place] [address]   Body fluid culture     Status: None (Preliminary result)   Collection Time: 06/26/20 12:31 PM   Specimen: Pleura; Body Fluid  Result Value Ref Range Status   Specimen Description PLEURAL FLUID RIGHT LUNG  Final   Special Requests NONE  Final   Gram Stain   Final    RARE WBC PRESENT, PREDOMINANTLY MONONUCLEAR NO ORGANISMS SEEN    Culture   Final    NO GROWTH 2 DAYS Performed at [facility], [address], [place], [place] [address]    Report Status PENDING  Incomplete    RADIOLOGY STUDIES/RESULTS: DG CHEST PORT 1 VIEW  Result Date: 06/28/2020 CLINICAL DATA:  Pleural effusion EXAM: PORTABLE CHEST 1 VIEW COMPARISON:  June 26, 2020 FINDINGS: There is essentially complete opacification of the right hemithorax with large pleural effusion. There is a small left pleural effusion. No edema or consolidation noted on the left. Right lung obscured by presumed pleural effusion, precluding assessment for potential underlying infiltrate. Heart is upper normal in size. Pulmonary vascularity on the left peers normal. Pulmonary vascular on the right obscured. There is aortic atherosclerosis. Central catheter tip is the cavoatrial junction. No pneumothorax. No bone lesions. IMPRESSION: Essentially complete opacification of the right hemithorax due to large pleural effusion. A degree of atelectasis and/or consolidation on the right cannot be excluded in this circumstance. There is a small left pleural effusion. No edema or airspace opacity on the left. Cardiac silhouette appears stable. Stable central catheter positioning. Aortic Atherosclerosis (ICD10-I70.0). Electronically Signed    By: Lowella Grip III M.D.   On: 06/28/2020 06:13   DG CHEST PORT 1 VIEW  Result Date: 06/26/2020 CLINICAL DATA:  Dyspnea. EXAM: PORTABLE CHEST 1 VIEW COMPARISON:  Radiograph earlier this day. FINDINGS: Progressive right pleural effusion since exam earlier this day, now occupying the lower 1/2 of right hemithorax. Increasing opacity throughout the right hemithorax likely due to layering effusion and atelectasis. Left-sided dialysis catheter remains in place. Cardiomegaly is stable. Left costophrenic angle is not included in the field of view limiting assessment for left pleural effusion. There is no visualized pneumothorax. IMPRESSION: 1. Progressive right pleural effusion since exam earlier this day, now occupying the lower 1/2 of right hemithorax. 2. Increasing opacity throughout the right hemithorax likely due to layering effusion and atelectasis. 3. Stable cardiomegaly. Previous left lung base opacity not well assessed on the current exam given positioning. Electronically Signed   By: Keith Rake M.D.   On: 06/26/2020 21:31   ECHOCARDIOGRAM COMPLETE  Result Date: 06/27/2020    ECHOCARDIOGRAM REPORT   Patient Name:   Devin Harmon Kellogg Date of Exam: 06/27/2020 Medical Rec #:  [id number]       Height:       69.0 in Accession #:    [id number]      Weight:       194.9 lb Date of Birth:  06/14/1956        BSA:          2.043 m Patient Age:    65 years  BP:           167/81 mmHg Patient Gender: M               HR:           77 bpm. Exam Location:  Inpatient Procedure: 2D Echo                                 MODIFIED REPORT: This report was modified by Skeet Latch MD on 06/27/2020 due to wall motion.  Indications:     CHF-Acute Diastolic 161.09 / U04.54  History:         Patient has prior history of Echocardiogram examinations, most                  recent 04/13/2019. Stroke. Respiratory distress, right pleural                  effusion. End stage renal disease. Emergency dialysis                   overnight.  Sonographer:     Darlina Sicilian RDCS Referring Phys:  [id number] Guilford Shi Diagnosing Phys: Skeet Latch MD IMPRESSIONS  1. Hypokinesis of the basal to mid inferior myocardium. Left ventricular ejection fraction, by estimation, is 40 to 45%. The left ventricle has mildly decreased function. The left ventricle demonstrates regional wall motion abnormalities (see scoring diagram/findings for description). Left ventricular diastolic parameters are consistent with Grade I diastolic dysfunction (impaired relaxation).  2. Right ventricular systolic function is normal. The right ventricular size is normal. There is mildly elevated pulmonary artery systolic pressure.  3. Left atrial size was severely dilated.  4. The mitral valve is normal in structure. Trivial mitral valve regurgitation. No evidence of mitral stenosis.  5. The aortic valve is tricuspid. Aortic valve regurgitation is trivial. No aortic stenosis is present.  6. The inferior vena cava is dilated in size with >50% respiratory variability, suggesting right atrial pressure of 8 mmHg. FINDINGS  Left Ventricle: Hypokinesis of the basal to mid inferior myocardium. Left ventricular ejection fraction, by estimation, is 40 to 45%. The left ventricle has mildly decreased function. The left ventricle demonstrates regional wall motion abnormalities. The left ventricular internal cavity size was normal in size. There is no left ventricular hypertrophy. Left ventricular diastolic parameters are consistent with Grade I diastolic dysfunction (impaired relaxation). Right Ventricle: The right ventricular size is normal. No increase in right ventricular wall thickness. Right ventricular systolic function is normal. There is mildly elevated pulmonary artery systolic pressure. The tricuspid regurgitant velocity is 2.70  m/s, and with an assumed right atrial pressure of 8 mmHg, the estimated right ventricular systolic pressure is 47.8 mmHg. Left Atrium: Left  atrial size was severely dilated. Right Atrium: Right atrial size was normal in size. Pericardium: There is no evidence of pericardial effusion. Mitral Valve: The mitral valve is normal in structure. Normal mobility of the mitral valve leaflets. Trivial mitral valve regurgitation. No evidence of mitral valve stenosis. Tricuspid Valve: The tricuspid valve is normal in structure. Tricuspid valve regurgitation is trivial. No evidence of tricuspid stenosis. Aortic Valve: The aortic valve is tricuspid. . There is mild thickening and mild calcification of the aortic valve. Aortic valve regurgitation is trivial. No aortic stenosis is present. There is mild thickening of the aortic valve. There is mild calcification of the aortic valve. Pulmonic Valve: The pulmonic valve was normal in  structure. Pulmonic valve regurgitation is trivial. No evidence of pulmonic stenosis. Aorta: The aortic root is normal in size and structure. Venous: The inferior vena cava is dilated in size with greater than 50% respiratory variability, suggesting right atrial pressure of 8 mmHg. IAS/Shunts: No atrial level shunt detected by color flow Doppler.  LEFT VENTRICLE PLAX 2D LVIDd:         5.00 cm      Diastology LVIDs:         4.10 cm      LV e' lateral:   5.66 cm/s LV PW:         0.80 cm      LV E/e' lateral: 20.3 LV IVS:        1.00 cm      LV e' medial:    4.46 cm/s LVOT diam:     2.10 cm      LV E/e' medial:  25.8 LV SV:         104 LV SV Index:   51 LVOT Area:     3.46 cm  LV Volumes (MOD) LV vol d, MOD A2C: 291.0 ml LV vol d, MOD A4C: 276.0 ml LV vol s, MOD A2C: 176.0 ml LV vol s, MOD A4C: 191.0 ml LV SV MOD A2C:     115.0 ml LV SV MOD A4C:     276.0 ml LV SV MOD BP:      100.4 ml RIGHT VENTRICLE RV S prime:     10.40 cm/s TAPSE (M-mode): 2.4 cm LEFT ATRIUM             Index       RIGHT ATRIUM           Index LA diam:        4.20 cm 2.06 cm/m  RA Area:     18.90 cm LA Vol (A2C):   91.9 ml 44.97 ml/m RA Volume:   51.90 ml  25.40 ml/m LA  Vol (A4C):   86.3 ml 42.23 ml/m LA Biplane Vol: 91.7 ml 44.88 ml/m  AORTIC VALVE LVOT Vmax:   164.00 cm/s LVOT Vmean:  103.000 cm/s LVOT VTI:    0.299 m  AORTA Ao Root diam: 3.20 cm Ao Asc diam:  3.50 cm MITRAL VALVE                TRICUSPID VALVE MV Area (PHT): 7.02 cm     TR Peak grad:   29.2 mmHg MV Decel Time: 108 msec     TR Vmax:        270.00 cm/s MV E velocity: 115.00 cm/s MV A velocity: 113.00 cm/s  SHUNTS MV E/A ratio:  1.02         Systemic VTI:  0.30 m                             Systemic Diam: 2.10 cm Skeet Latch MD Electronically signed by Skeet Latch MD Signature Date/Time: 06/27/2020/11:17:16 AM    Final (Updated)      LOS: 2 days   Oren Binet, MD  [facility]    To contact the attending provider between 7A-7P or the covering provider during after hours 7P-7A, please log into the web site [email or web address] and access using universal [place] password for that web site. If you do not have the password, please call the hospital operator.  06/28/2020, 2:06 PM

## 2020-06-28 NOTE — Progress Notes (Signed)
Pharmacist Heart Failure Core Measure Documentation  Assessment: Devin Harmon has an EF documented as 40-45% on 06/27/20 by ECHO.  Rationale: Heart failure patients with left ventricular systolic dysfunction (LVSD) and an EF < 40% should be prescribed an angiotensin converting enzyme inhibitor (ACEI) or angiotensin receptor blocker (ARB) at discharge unless a contraindication is documented in the medical record.  This patient is not currently on an ACEI or ARB for HF.  This note is being placed in the record in order to provide documentation that a contraindication to the use of these agents is present for this encounter.  ACE Inhibitor or Angiotensin Receptor Blocker is contraindicated (specify all that apply)  []   ACEI allergy AND ARB allergy []   Angioedema []   Moderate or severe aortic stenosis []   Hyperkalemia []   Hypotension []   Renal artery stenosis [x]   Worsening renal function, preexisting renal disease or dysfunction   [name] 06/28/2020 2:34 PM

## 2020-06-28 NOTE — Procedures (Signed)
Patient ID: Devin Harmon, male   DOB: [birth year], 64 y.o.   MRN: [id number]  [facility] Progress Note   Assessment/ Plan:   1. Acute hypoxic respiratory failure: Secondary to combination of pulmonary edema and large right pleural effusion stemming from his chronic nonadherence with prescribed hemodialysis and unrestricted interdialytic weight gain. He underwent thoracentesis and hemodialysis with need for emergent hemodialysis thereafter. Net neg 8.6L so far. Open for chest tube today. -if any acute decompensation in resp status, will likely need CRRT briefly to ensure continuous volume offloading 2. ESRD: On hemodialysis on a Monday/Wednesday/Friday schedule with pattern of chronic nonadherence with hemodialysis/truncated treatments and leaving over EDW. Discussed risks. Running today again given acute decompensation from resp standpoint again. Will run again tomorrow 3. Anemia: Hemoglobin and hematocrit currently at goal, off ESA at this time. 4. CKD-MBD: Calcium level acceptable, will add phosphorus level to labs drawn earlier. We'll continue Auryxia for phosphorus binding with meals. Not on vitamin D receptor analog. Phos restriction in diet 5. Nutrition: Nutrition limited by current respiratory status, monitor for aspiration. 6. Hypertension: Elevated blood pressure noted, resumed oral antihypertensive therapy and attempting to lower dry weight.  I was present at this dialysis session. I have reviewed the session itself and made appropriate changes.   Subjective:   Acute decompensation last night, reexpansion edema post thora. Open for chest tube. HD at bedside today BFR 350, lij tdc, uf goal 6L, HR 60, BP 137/73   Objective:   BP (!) 137/77 (BP Location: Right Arm)   Pulse 61   Temp 98.3 F (36.8 C) (Axillary)   Resp 22   Wt 82.8 kg   SpO2 100%   BMI 26.96 kg/m   Physical Exam: Gen: in acute distress, lethargic CVS: Pulse regular rhythm, normal rate, S1 and S2  normal Resp: Right side with distant breath sounds, left side coarse breath sounds. No distinct rhonchi. Left IJ TDC, on bipap, labored Abd: Soft, obese, nontender Ext: Chronic lymphedema with superimposed trace pitting edema. Left upper arm AV fistula with palpable thrill.  Labs: BMET Recent Labs  Lab 06/26/20 0858 06/26/20 1907 06/26/20 2105 06/27/20 0500 06/28/20 0543 06/28/20 0909  NA 138  --  136 138 138 137  K 5.0  --  4.3 4.2 4.0 4.0  CL 98  --   --  99 98 99  CO2 21*  --   --  25 27 23   GLUCOSE 97  --   --  89 103* 94  BUN 56*  --   --  34* 30* 30*  CREATININE 10.14* 6.79*  --  6.63* 6.09* 6.04*  CALCIUM 8.4*  --   --  8.0* 8.0* 7.9*  PHOS  --   --   --   --  6.1* 6.3*   CBC Recent Labs  Lab 06/26/20 0858 06/26/20 1907 06/26/20 2105 06/28/20 0543  WBC 8.7 10.9*  --  8.0  HGB 11.1* 11.4* 12.2* 11.0*  HCT 36.1* 35.4* 36.0* 36.1*  MCV 91.9 91.2  --  92.8  PLT 241 194  --  195     Medications:    . amLODipine  10 mg Oral Daily  . Chlorhexidine Gluconate Cloth  6 each Topical Q0600  . ferric citrate  420 mg Oral TID WC  . heparin  5,000 Units Subcutaneous Q8H  . labetalol  100 mg Oral BID  . multivitamin  1 tablet Oral Daily  . sodium chloride flush  3 mL Intravenous Once  Gean Quint, MD [facility] 06/28/2020, 11:43 AM

## 2020-06-28 NOTE — Progress Notes (Signed)
OT Cancellation Note  Patient Details Name: MANDELA BELLO MRN: [id number] DOB: [birth year]   Cancelled Treatment:    Reason Eval/Treat Not Completed: Medical issues which prohibited therapy. Per chart review rapid response called while patient in dialysis with plan for chest tube due to pleural effusion after dialysis. Will defer eval to 7/30 as schedule allows/patient medically able.   Delbert Phenix OT OT office: [phone]   Rosemary Holms 06/28/2020, 10:33 AM

## 2020-06-28 NOTE — Progress Notes (Signed)
HD nurse notified this nurse of new onset ABD breathing during bedside treatment.   Notified RR, acute care, charge, respiratory Bipap restarted Pt alert and able to answer questions/follow commands

## 2020-06-28 NOTE — Progress Notes (Signed)
NAME:  Devin Harmon, MRN:  [id number], DOB:  July 23, 1956, LOS: 2 ADMISSION DATE:  06/26/2020, CONSULTATION DATE:  06/26/20 REFERRING MD:  Ronnald Nian, CHIEF COMPLAINT:  Shortness of breath  Brief History   64yom presenting with acute respiratory failure 2/2 a large right pleural effusion and pulm edema that is attributable to hypervolemic state in the setting of missed HD.  Underwent large thoracentesis 7/27 with 2,382ml removed with almost completed reacumulation same day. Has received 3 HD treatments since admission and continues to suffer from recurrent pleural effusion.   History of present illness   58 yom with ESRD on HD who presented to [facility] on 06/26/20 for 2d history of progressive shortness of breath. Wife is primary historian due to pt being on BiPAP.    She notes that he missed HD on Friday due to diarrhea. Since that time, he developed progressive shortness of breath and loss of appetite. He was able to make it to HD on day prior to admission, however it is unclear how much fluid was removed. Over the same time period, he additionally developed orthopnea which he does not have at baseline.  Also of note, his wife explains that he recently changed HD centers and has developed worsening LE edema since then. Denies recent fevers, chills or respiratory symptoms other than the shortness of breath.   Past Medical History  ESRD on HD, hypertension  Significant Hospital Events   7/27 admission; thoracentesis  Consults:  PCCM Nephrology  Procedures:  Right thoracentesis 7/27>>2351mL clear yellow fluid  Significant Diagnostic Tests:  7/27 CXR>> pulm edema with large right pleural effusion. Cardiomegaly. Bibasilar atelectasis  Micro Data:  COVID neg Pleural fluid cultures>>  Antimicrobials:  n/a  Interim history/subjective:  Called early AM from primary attending stating patient is now more confused and unable to tolerate BIPAP with continued respiratory distress from recurrent  pleural effusion.    Currently receiving iHD   Objective   Blood pressure (!) 139/77, pulse 56, temperature 98.5 F (36.9 C), temperature source Oral, resp. rate (!) 30, weight 82.8 kg, SpO2 99 %.    FiO2 (%):  [60 %] 60 %  No intake or output data in the 24 hours ending 06/28/20 0918 Filed Weights   06/27/20 0800 06/28/20 0441 06/28/20 0657  Weight: 83.1 kg 80.6 kg 82.8 kg    Examination: General: Chronically ill appearing elderly male/male lying in bed in no acute distress HEENT: [place]/AT, MM pink/moist, PERRL, sclera non-icteric   Neuro: Sleepy after receiving Ativan this am due to agitation, will open eyes to physical stimuli CV: s1s2 regular rate and rhythm, no murmur, rubs, or gallops,  PULM:  Very diminished on right side, fain expiratory wheeze, oxygen saturations 99-100% on 15LHFNC GI: soft, bowel sounds active in all 4 quadrants, non-tender, non-distended Extremities: warm/dry, no LE edema  Skin: no rashes or lesions  Resolved Hospital Problem list   n/a  Assessment & Plan:   Acute hypoxic respiratory failure requiring NIPPV secondary to pulm edema and bilateral pleural effusions --suspect transudative. Attributable to hypervolemic state in setting of missed HD sessions -As of AM of 7/29 fluid has reaccumulated with respiratory distress seen S/P right thoracentesis  -7/27--2359mL removed, fluid shows high protein but low LDH very few cells, borderline exudate -Some concern for SVC syndrome given dilated veins in upper chest  Plan Patient receiving third treatment of iHD this am with goal of 5-6L removed Once iHD complete patient will likely need pigtail chest tube for continued drainage of  pleural effusion  Continue BIPAP therapy currently until IHD can be completed  Spo2 goal greater than 92% Encourage pulmonary hygiene   Rest of acute and chronic medical conditions managed by priamary team   PCCM will continue to follow   Best practice:  Diet: Heart healthy   Pain/Anxiety/Delirium protocol (if indicated): PRN VAP protocol (if indicated): N/A DVT prophylaxis: Subq heparin  GI prophylaxis: PPI Glucose control: Monitor  Mobility: Bedrest  Code Status: Full Family Communication: Wife updated over the phone 7/28 Disposition: Floor   Labs   CBC: Recent Labs  Lab 06/26/20 0858 06/26/20 1907 06/26/20 2105 06/28/20 0543  WBC 8.7 10.9*  --  8.0  HGB 11.1* 11.4* 12.2* 11.0*  HCT 36.1* 35.4* 36.0* 36.1*  MCV 91.9 91.2  --  92.8  PLT 241 194  --  160    Basic Metabolic Panel: Recent Labs  Lab 06/26/20 0858 06/26/20 1907 06/26/20 2105 06/27/20 0500 06/28/20 0543  NA 138  --  136 138 138  K 5.0  --  4.3 4.2 4.0  CL 98  --   --  99 98  CO2 21*  --   --  25 27  GLUCOSE 97  --   --  89 103*  BUN 56*  --   --  34* 30*  CREATININE 10.14* 6.79*  --  6.63* 6.09*  CALCIUM 8.4*  --   --  8.0* 8.0*  MG  --   --   --   --  2.2  PHOS  --   --   --   --  6.1*   GFR: Estimated Creatinine Clearance: 12.1 mL/min (A) (by C-G formula based on SCr of 6.09 mg/dL (H)). Recent Labs  Lab 06/26/20 0858 06/26/20 0943 06/26/20 1907 06/28/20 0543  WBC 8.7  --  10.9* 8.0  LATICACIDVEN  --  1.5 1.3  --     Liver Function Tests: Recent Labs  Lab 06/26/20 0858 06/27/20 1128 06/28/20 0543  AST 25  --   --   ALT 19  --   --   ALKPHOS 69  --   --   BILITOT 1.2  --   --   PROT 7.4 6.6  --   ALBUMIN 4.0  --  3.3*   No results for input(s): LIPASE, AMYLASE in the last 168 hours. No results for input(s): AMMONIA in the last 168 hours.  ABG    Component Value Date/Time   PHART 7.435 06/26/2020 2105   PCO2ART 36.6 06/26/2020 2105   PO2ART 58 (L) 06/26/2020 2105   HCO3 24.5 06/26/2020 2105   TCO2 26 06/26/2020 2105   ACIDBASEDEF 8.4 (H) 04/12/2019 1801   O2SAT 90.0 06/26/2020 2105     Coagulation Profile: No results for input(s): INR, PROTIME in the last 168 hours.  Cardiac Enzymes: No results for input(s): CKTOTAL, CKMB, CKMBINDEX,  TROPONINI in the last 168 hours.  HbA1C: No results found for: HGBA1C  CBG: No results for input(s): GLUCAP in the last 168 hours.    Signature:  Johnsie Cancel, NP-C  Pulmonary & Critical Care Contact / Pager information can be found on Amion  06/28/2020, 9:31 AM

## 2020-06-28 NOTE — Progress Notes (Signed)
PT Cancellation Note  Patient Details Name: Devin Harmon MRN: [id number] DOB: 26-Oct-1956   Cancelled Treatment:    Reason Eval/Treat Not Completed: Patient not medically ready. Pt lethargic on bipap and HD currently. Will follow up tomorrow.    Shary Decamp [facility] 06/28/2020, 11:28 AM [place] Pager [phone] Office [phone]

## 2020-06-28 NOTE — Progress Notes (Signed)
Patient having increased ABD breathing, primary RN notified, patient back on Bipap. We continue to monitor.

## 2020-06-28 NOTE — Significant Event (Addendum)
Rapid Response Event Note  Overview: Time Called: 0908 Arrival Time: 0912 Event Type: Respiratory Abdominal breathing noted during HD treatment  Initial Focused Assessment: Pt lying in bed. Awake, alert. Increased work of breathing with tachypnea. Paradoxical breathing. Lung sounds are clear on the left, diminished on the right. No adventitious heart sounds. JVD. Pulses 2+ palpable in all extremities, extremities are warm & dry. He is able to follow commands, PERRLA.  VS: T 98.38F, BP 139/77, HR 56, RR 30, SpO2 99% on BiPAP 60% 12/6  Interventions: No intervention from RRRN  -BiPAP applied with previous settings 60% 12/6, RT notified - pt pulling good tidal volumes  Plan of Care (if not transferred): -Plan for chest tube placement for pleural effusion following HD treatment, currently 2 hours until HD is completed.   Call rapid response for additional needs  Event Summary: Name of Physician Notified: Jeannene Patella, PCCM NP at 0915 Outcome: Stayed in room and stabalized Event End Time: [place]

## 2020-06-28 NOTE — Procedures (Signed)
Insertion of Chest Tube Procedure Note  Devin Harmon  [id number]  Sep 16, 1956  Date:06/28/20  Time:1:38 PM    Provider Performing: Johnsie Cancel   Procedure: Pleural Catheter Insertion w/ Imaging Guidance [phone])  Indication(s) Effusion  Consent Risks of the procedure as well as the alternatives and risks of each were explained to the patient and/or caregiver.  Consent for the procedure was obtained and is signed in the bedside chart  Anesthesia Topical only with 1% lidocaine    Time Out Verified patient identification, verified procedure, site/side was marked, verified correct patient position, special equipment/implants available, medications/allergies/relevant history reviewed, required imaging and test results available.   Sterile Technique Maximal sterile technique including full sterile barrier drape, hand hygiene, sterile gown, sterile gloves, mask, hair covering, sterile ultrasound probe cover (if used).   Procedure Description Ultrasound used to identify appropriate pleural anatomy for placement and overlying skin marked. Area of placement cleaned and draped in sterile fashion.  A Small bore 14 French pigtail pleural catheter was placed into the right pleural space using Seldinger technique. Appropriate return of fluid was obtained.  The tube was connected to atrium and placed on -20 cm H2O wall suction.   Complications/Tolerance None; patient tolerated the procedure well. Chest X-ray is ordered to verify placement.   EBL Minimal  Specimen(s) none  Johnsie Cancel, NP-C [place] [facility] Contact / Pager information can be found on Amion  06/28/2020, 1:40 PM

## 2020-06-28 NOTE — Progress Notes (Signed)
Pt's confused, disoriented x 4, responded to voice and followed simple commands. Sometimes he yelled and screamed and pulled his O2 out, then developed dyspnea. Ativan 0.5 mg IV given PRN when agitated.   Pt was unable to tolerate BiPAP tonight, RT tried to wean off BiPAP and gave HFNCL 15 LPM, Pt tolerated well. His SPO2 98-99%. RR 17-25. When sometimes he pulled his O2 out, SPO2 dropped to 84-86%.  Auscultated lungs clear upper lobes,but breath sound diminished  basilar bilaterally.  EKG sinus rhythm on monitor, HR 60s, BP 138/77 -149/ 88 mmHg. He remained afebrile. We will continue to monitor.  Kennyth Lose, RN

## 2020-06-29 LAB — MAGNESIUM: Magnesium: 2.1 mg/dL (ref 1.7–2.4)

## 2020-06-29 LAB — RENAL FUNCTION PANEL
Albumin: 3.1 g/dL — ABNORMAL LOW (ref 3.5–5.0)
Albumin: 2.9 g/dL — ABNORMAL LOW (ref 3.5–5.0)
Anion gap: 11 (ref 5–15)
Anion gap: 9 (ref 5–15)
BUN: 23 mg/dL (ref 8–23)
BUN: 25 mg/dL — ABNORMAL HIGH (ref 8–23)
CO2: 28 mmol/L (ref 22–32)
CO2: 30 mmol/L (ref 22–32)
Calcium: 8.1 mg/dL — ABNORMAL LOW (ref 8.9–10.3)
Calcium: 8.1 mg/dL — ABNORMAL LOW (ref 8.9–10.3)
Chloride: 100 mmol/L (ref 98–111)
Chloride: 101 mmol/L (ref 98–111)
Creatinine, Ser: 5.4 mg/dL — ABNORMAL HIGH (ref 0.61–1.24)
Creatinine, Ser: 5.21 mg/dL — ABNORMAL HIGH (ref 0.61–1.24)
GFR calc Af Amer: 12 mL/min — ABNORMAL LOW (ref >60)
GFR calc Af Amer: 12 mL/min — ABNORMAL LOW (ref >60)
GFR calc non Af Amer: 10 mL/min — ABNORMAL LOW (ref >60)
GFR calc non Af Amer: 11 mL/min — ABNORMAL LOW (ref >60)
Glucose, Bld: 105 mg/dL — ABNORMAL HIGH (ref 70–99)
Glucose, Bld: 136 mg/dL — ABNORMAL HIGH (ref 70–99)
Phosphorus: 3.7 mg/dL (ref 2.5–4.6)
Phosphorus: 2.6 mg/dL (ref 2.5–4.6)
Potassium: 3.5 mmol/L (ref 3.5–5.1)
Potassium: 3.6 mmol/L (ref 3.5–5.1)
Sodium: 139 mmol/L (ref 135–145)
Sodium: 140 mmol/L (ref 135–145)

## 2020-06-29 LAB — CBC
HCT: 37.4 % — ABNORMAL LOW (ref 39.0–52.0)
Hemoglobin: 11.7 g/dL — ABNORMAL LOW (ref 13.0–17.0)
MCH: 29 pg (ref 26.0–34.0)
MCHC: 31.3 g/dL (ref 30.0–36.0)
MCV: 92.8 fL (ref 80.0–100.0)
Platelets: 188 10*3/uL (ref 150–400)
RBC: 4.03 MIL/uL — ABNORMAL LOW (ref 4.22–5.81)
RDW: 18.6 % — ABNORMAL HIGH (ref 11.5–15.5)
WBC: 8.6 10*3/uL (ref 4.0–10.5)
nRBC: 0.2 % (ref 0.0–0.2)

## 2020-06-29 LAB — GLUCOSE, CAPILLARY: Glucose-Capillary: 93 mg/dL (ref 70–99)

## 2020-06-29 LAB — BODY FLUID CULTURE: Culture: NO GROWTH

## 2020-06-29 MED ORDER — HEPARIN SODIUM (PORCINE) 1000 UNIT/ML IJ SOLN
INTRAMUSCULAR | Status: AC
  Administered 2020-06-29: 1000 [IU]
  Filled 2020-06-29: qty 4

## 2020-06-29 MED ORDER — LORAZEPAM 2 MG/ML IJ SOLN
0.5000 mg | Freq: Four times a day (QID) | INTRAMUSCULAR | Status: DC | PRN
  Administered 2020-06-29 – 2020-07-03 (×8): 0.5 mg via INTRAVENOUS
  Filled 2020-06-29 (×8): qty 1

## 2020-06-29 MED ORDER — LORAZEPAM 2 MG/ML IJ SOLN
INTRAMUSCULAR | Status: AC
  Filled 2020-06-29: qty 1

## 2020-06-29 MED ORDER — LORAZEPAM 2 MG/ML IJ SOLN: 0.5000 mg | Freq: Four times a day (QID) | INTRAMUSCULAR | Status: DC | PRN

## 2020-06-29 MED ORDER — LORAZEPAM 2 MG/ML IJ SOLN: 0.5000 mg | Freq: Four times a day (QID) | INTRAMUSCULAR | Status: DC

## 2020-06-29 MED ORDER — SODIUM CHLORIDE 0.9% FLUSH
10.0000 mL | Freq: Three times a day (TID) | INTRAVENOUS | Status: DC
  Administered 2020-06-29 – 2020-07-02 (×5): 10 mL

## 2020-06-29 MED ORDER — ALTEPLASE 2 MG IJ SOLR
INTRAMUSCULAR | Status: AC
  Administered 2020-06-29: 2 mg
  Filled 2020-06-29: qty 4

## 2020-06-29 NOTE — Progress Notes (Signed)
PROGRESS NOTE        PATIENT DETAILS Name: Devin Harmon Age: 64 y.o. Sex: male Date of Birth: [birth year] Admit Date: 06/26/2020 Admitting Physician Guilford Shi, MD VEL:[id number], Theophilus Kinds, MD  Brief Narrative: Patient is a 64 y.o. male with history of ESRD on HD MWF (per family-frequently cuts short dialysis sessions)-presented with shortness of breath, lower extremity edema-was found to have acute hypoxic respiratory failure secondary to volume overload/pleural effusion.  He was seen by PCCM-underwent thoracocentesis-and was seen by nephrology and underwent urgent HD.  See below for further details.    Significant events: 7/27>> admit to [facility] for S OB/volume overload/pleural effusion-briefly required BiPAP on admission.  Significant studies: 7/27>> chest x-ray: Large right-sided pleural effusion, mild pulmonary venous congestion  Antimicrobial therapy: None  Microbiology data: 7/27>> blood culture: Pending  Procedures : 7/27>> thoracocentesis 7/29>> right-sided chest tube by PCCM  Consults: Nephrology, pulmonology  DVT Prophylaxis : heparin injection 5,000 Units Start: 06/26/20 1400  Subjective: Significantly improved-awake/alert-no major issues overnight.  Assessment/Plan: Acute hypoxic respiratory failure: Secondary to volume overload/pulmonary edema and large right-sided pleural effusion due to underdialysis (per family-frequently cuts short dialysis sessions).  Respiratory failure has markedly improved post chest tube placement and numerous HD treatments.  Continue attempts to slowly titrate down FiO2-PCCM following and managing chest tube.    ESRD: Anasarca has improved after numerous HD-he is now -16 L.  Nephrology following.   Anemia of chronic disease: Hemoglobin stable-no indication for transfusion-follow  HTN: BP controlled-continue amlodipine/labetalol  History of mild right-sided hemiparesis/dysarthria from prior CVA: Per  spouse at bedside on 7/28-he is currently at baseline in terms of his speech and right-sided weakness.  Given acute illness-may have more debility-continue PT/OT.   Diet: Diet Order            Diet Heart Room service appropriate? Yes with Assist; Fluid consistency: Thin  Diet effective now                  Code Status: Full code   Family Communication: Spouse at bedside on 7/28-we will talk to family over the next few days.  Disposition Plan: Status is: Inpatient  Remains inpatient appropriate because:Inpatient level of care appropriate due to severity of illness   Dispo: The patient is from: Home              Anticipated d/c is to: Home              Anticipated d/c date is: 3 days              Patient currently is not medically stable to d/c.   Barriers to Discharge: Volume overload/respiratory failure from under dialysis with large right-sided pleural effusion-chest tube remains in place.  Antimicrobial agents: Anti-infectives (From admission, onward)   Start     Dose/Rate Route Frequency Ordered Stop   06/26/20 1000  cefTRIAXone (ROCEPHIN) 1 g in sodium chloride 0.9 % 100 mL IVPB        1 g 200 mL/hr over 30 Minutes Intravenous  Once 06/26/20 0947 06/26/20 1106   06/26/20 1000  azithromycin (ZITHROMAX) 500 mg in sodium chloride 0.9 % 250 mL IVPB  Status:  Discontinued        500 mg 250 mL/hr over 60 Minutes Intravenous  Once 06/26/20 0947 06/26/20 1228       Time  spent: 25- minutes-Greater than 50% of this time was spent in counseling, explanation of diagnosis, planning of further management, and coordination of care.  MEDICATIONS: Scheduled Meds: . amLODipine  10 mg Oral Daily  . Chlorhexidine Gluconate Cloth  6 each Topical Q0600  . ferric citrate  420 mg Oral TID WC  . heparin  5,000 Units Subcutaneous Q8H  . labetalol  100 mg Oral BID  . multivitamin  1 tablet Oral Daily  . sodium chloride flush  10 mL Intracatheter Q8H  . sodium chloride flush  3 mL  Intravenous Once   Continuous Infusions: PRN Meds:.acetaminophen, albuterol, fluticasone, hydrALAZINE, loratadine, LORazepam, ondansetron **OR** ondansetron (ZOFRAN) IV, polyethylene glycol   PHYSICAL EXAM: Vital signs: Vitals:   06/29/20 0416 06/29/20 0610 06/29/20 0730 06/29/20 1230  BP: (!) 143/71  (!) 139/96 (!) 145/71  Pulse: 60 66 56 69  Resp: (!) 26 19 16 21   Temp: 98.1 F (36.7 C)  98.2 F (36.8 C) 98.6 F (37 C)  TempSrc: Oral  Oral Oral  SpO2: 95% 99% 99% 93%  Weight:  76.4 kg     Filed Weights   06/28/20 0657 06/28/20 1139 06/29/20 0610  Weight: 82.8 kg 76.7 kg 76.4 kg   Body mass index is 24.87 kg/m.   Gen Exam:Alert awake-not in any distress HEENT:atraumatic, normocephalic Chest: B/L clear to auscultation anteriorly CVS:S1S2 regular Abdomen:soft non tender, non distended Extremities:+ edema Neurology: Non focal Skin: no rash  I have personally reviewed following labs and imaging studies  LABORATORY DATA: CBC: Recent Labs  Lab 06/26/20 0858 06/26/20 1907 06/26/20 2105 06/28/20 0543 06/29/20 0456  WBC 8.7 10.9*  --  8.0 8.6  HGB 11.1* 11.4* 12.2* 11.0* 11.7*  HCT 36.1* 35.4* 36.0* 36.1* 37.4*  MCV 91.9 91.2  --  92.8 92.8  PLT 241 194  --  195 093    Basic Metabolic Panel: Recent Labs  Lab 06/26/20 0858 06/26/20 0858 06/26/20 1907 06/26/20 2105 06/27/20 0500 06/28/20 0543 06/28/20 0909 06/29/20 0456  NA 138   < >  --  136 138 138 137 139  K 5.0   < >  --  4.3 4.2 4.0 4.0 3.5  CL 98  --   --   --  99 98 99 100  CO2 21*  --   --   --  25 27 23 28   GLUCOSE 97  --   --   --  89 103* 94 105*  BUN 56*  --   --   --  34* 30* 30* 23  CREATININE 10.14*   < > 6.79*  --  6.63* 6.09* 6.04* 5.40*  CALCIUM 8.4*  --   --   --  8.0* 8.0* 7.9* 8.1*  MG  --   --   --   --   --  2.2  --  2.1  PHOS  --   --   --   --   --  6.1* 6.3* 3.7   < > = values in this interval not displayed.    GFR: Estimated Creatinine Clearance: 13.6 mL/min (A) (by C-G  formula based on SCr of 5.4 mg/dL (H)).  Liver Function Tests: Recent Labs  Lab 06/26/20 0858 06/27/20 1128 06/28/20 0543 06/28/20 0909 06/29/20 0456  AST 25  --   --   --   --   ALT 19  --   --   --   --   ALKPHOS 69  --   --   --   --  BILITOT 1.2  --   --   --   --   PROT 7.4 6.6  --   --   --   ALBUMIN 4.0  --  3.3* 3.3* 3.1*   No results for input(s): LIPASE, AMYLASE in the last 168 hours. No results for input(s): AMMONIA in the last 168 hours.  Coagulation Profile: No results for input(s): INR, PROTIME in the last 168 hours.  Cardiac Enzymes: No results for input(s): CKTOTAL, CKMB, CKMBINDEX, TROPONINI in the last 168 hours.  BNP (last 3 results) No results for input(s): PROBNP in the last 8760 hours.  Lipid Profile: No results for input(s): CHOL, HDL, LDLCALC, TRIG, CHOLHDL, LDLDIRECT in the last 72 hours.  Thyroid Function Tests: No results for input(s): TSH, T4TOTAL, FREET4, T3FREE, THYROIDAB in the last 72 hours.  Anemia Panel: No results for input(s): VITAMINB12, FOLATE, FERRITIN, TIBC, IRON, RETICCTPCT in the last 72 hours.  Urine analysis:    Component Value Date/Time   COLORURINE STRAW (A) 08/27/2018 2129   APPEARANCEUR CLEAR 08/27/2018 2129   LABSPEC 1.011 08/27/2018 2129   PHURINE 6.0 08/27/2018 2129   GLUCOSEU 50 (A) 08/27/2018 2129   HGBUR SMALL (A) 08/27/2018 2129   BILIRUBINUR NEGATIVE 08/27/2018 2129   KETONESUR 5 (A) 08/27/2018 2129   PROTEINUR >=300 (A) 08/27/2018 2129   NITRITE NEGATIVE 08/27/2018 2129   LEUKOCYTESUR NEGATIVE 08/27/2018 2129    Sepsis Labs: Lactic Acid, Venous    Component Value Date/Time   LATICACIDVEN 1.3 06/26/2020 1907    MICROBIOLOGY: Recent Results (from the past 240 hour(s))  Blood culture (routine x 2)     Status: None (Preliminary result)   Collection Time: 06/26/20  9:43 AM   Specimen: BLOOD RIGHT FOREARM  Result Value Ref Range Status   Specimen Description BLOOD RIGHT FOREARM  Final   Special  Requests   Final    BOTTLES DRAWN AEROBIC AND ANAEROBIC Blood Culture adequate volume   Culture   Final    NO GROWTH 2 DAYS Performed at [facility], [address] [place], [place] [address]    Report Status PENDING  Incomplete  Blood culture (routine x 2)     Status: None (Preliminary result)   Collection Time: 06/26/20  9:48 AM   Specimen: BLOOD  Result Value Ref Range Status   Specimen Description BLOOD RIGHT ANTECUBITAL  Final   Special Requests   Final    BOTTLES DRAWN AEROBIC AND ANAEROBIC Blood Culture adequate volume   Culture   Final    NO GROWTH 2 DAYS Performed at [facility], [place] [address] [place], [place] [address]    Report Status PENDING  Incomplete  SARS Coronavirus 2 by RT PCR     Status: None   Collection Time: 06/26/20  9:53 AM  Result Value Ref Range Status   SARS Coronavirus 2 NEGATIVE NEGATIVE Final    Comment: (NOTE) Result indicates the ABSENCE of SARS-CoV-2 RNA in the patient specimen.   The lowest concentration of SARS-CoV-2 viral copies this assay can detect in nasopharyngeal swab specimens is 500 copies / mL.  A negative result does not preclude SARS-CoV-2 infection and should not be used as the sole basis for patient management decisions. A negative result may occur with improper specimen collection / handling, submission of a specimen other than nasopharyngeal swab, presence of viral mutation(s) within the areas targeted by this assay, and inadequate number of viral copies (<500 copies / mL) present.  Negative results must  be combined with clinical observations, patient history, and epidemiological information.   The expected result is NEGATIVE.   Patient Fact Sheet:  BlogSelections.co.uk    Provider Fact Sheet:  https://lucas.com/    This test is not yet approved or cleared by the [place] FDA and  has been [place] orized for detection and/or diagnosis of SARS-CoV-2 by FDA  under an Emergency Use Authorization (EUA).  This EUA will remain in effect (meaning this test can be used) for the duration of  the COVID-19 declaration under Section 564(b)(1) of the Act, 21 U.S.C. section 360bbb-3(b)(1), unless the authorization is terminated or revoked sooner  Performed at [facility], [place] [address], [place], [place] [address]   Body fluid culture     Status: None   Collection Time: 06/26/20 12:31 PM   Specimen: Pleura; Body Fluid  Result Value Ref Range Status   Specimen Description PLEURAL FLUID RIGHT LUNG  Final   Special Requests NONE  Final   Gram Stain   Final    RARE WBC PRESENT, PREDOMINANTLY MONONUCLEAR NO ORGANISMS SEEN    Culture   Final    NO GROWTH 3 DAYS Performed at [facility], [address], [place], [place] [address]    Report Status 06/29/2020 FINAL  Final    RADIOLOGY STUDIES/RESULTS: DG CHEST PORT 1 VIEW  Result Date: 06/28/2020 CLINICAL DATA:  64 year old male status post chest tube placement on the right. EXAM: PORTABLE CHEST 1 VIEW COMPARISON:  Earlier radiograph dated 06/28/2020. FINDINGS: Interval placement of a right-sided chest tube with significant decrease in the right pleural effusion and partial aeration of the right lung. No pneumothorax. The dialysis catheter is in similar position. There is cardiomegaly. Atherosclerotic calcification of the aorta. No acute osseous pathology. IMPRESSION: Interval placement of a right-sided chest tube with significant decrease in the right pleural effusion and partial aeration of the right lung. Electronically Signed   By: Anner Crete M.D.   On: 06/28/2020 16:21   DG CHEST PORT 1 VIEW  Result Date: 06/28/2020 CLINICAL DATA:  Pleural effusion EXAM: PORTABLE CHEST 1 VIEW COMPARISON:  June 26, 2020 FINDINGS: There is essentially complete opacification of the right hemithorax with large pleural effusion. There is a small left pleural effusion. No edema or consolidation noted on  the left. Right lung obscured by presumed pleural effusion, precluding assessment for potential underlying infiltrate. Heart is upper normal in size. Pulmonary vascularity on the left peers normal. Pulmonary vascular on the right obscured. There is aortic atherosclerosis. Central catheter tip is the cavoatrial junction. No pneumothorax. No bone lesions. IMPRESSION: Essentially complete opacification of the right hemithorax due to large pleural effusion. A degree of atelectasis and/or consolidation on the right cannot be excluded in this circumstance. There is a small left pleural effusion. No edema or airspace opacity on the left. Cardiac silhouette appears stable. Stable central catheter positioning. Aortic Atherosclerosis (ICD10-I70.0). Electronically Signed   By: Lowella Grip III M.D.   On: 06/28/2020 06:13     LOS: 3 days   Oren Binet, MD  [facility]    To contact the attending provider between 7A-7P or the covering provider during after hours 7P-7A, please log into the web site [email or web address] and access using universal [place] password for that web site. If you do not have the password, please call the hospital operator.  06/29/2020, 1:37 PM

## 2020-06-29 NOTE — Progress Notes (Signed)
Pt arrived to the floor from dialysis alert on 2l O2. VSS CBG obtain. Meal tray set up we'll continue to monitor.

## 2020-06-29 NOTE — Progress Notes (Addendum)
NAME:  Devin Harmon, MRN:  [id number], DOB:  January 18, 1956, LOS: 3 ADMISSION DATE:  06/26/2020, CONSULTATION DATE:  06/26/20 REFERRING MD:  Ronnald Nian, CHIEF COMPLAINT:  Shortness of breath  Brief History   64yom presenting with acute respiratory failure 2/2 a large right pleural effusion and pulm edema that is attributable to hypervolemic state in the setting of missed HD.  Underwent large thoracentesis 7/27 with 2,31ml removed with almost completed reacumulation same day. Has received 3 HD treatments since admission and continues to suffer from recurrent pleural effusion.   History of present illness   84 yom with ESRD on HD who presented to [facility] on 06/26/20 for 2d history of progressive shortness of breath. Wife is primary historian due to pt being on BiPAP.    She notes that he missed HD on Friday due to diarrhea. Since that time, he developed progressive shortness of breath and loss of appetite. He was able to make it to HD on day prior to admission, however it is unclear how much fluid was removed. Over the same time period, he additionally developed orthopnea which he does not have at baseline.  Also of note, his wife explains that he recently changed HD centers and has developed worsening LE edema since then. Denies recent fevers, chills or respiratory symptoms other than the shortness of breath.   Past Medical History  ESRD on HD, hypertension  Significant Hospital Events   7/27 admission; thoracentesis  Consults:  PCCM Nephrology  Procedures:  Right thoracentesis 7/27>>2360mL clear yellow fluid Pigtail chest tube inserted 7/29 with 2L fluid removed   Significant Diagnostic Tests:  7/27 CXR>> pulm edema with large right pleural effusion. Cardiomegaly. Bibasilar atelectasis  Micro Data:  COVID > neg Pleural fluid cultures > Pleural gram stain > Rare WBC, predominately Mononuclear   Antimicrobials:  n/a  Interim history/subjective:  No acute events overnight. Chest tube  clamped overnight when 2L were removed per MD order Unclamped chest tube this am and another 1768ml drained almost immediately  iHD planned again today    Objective   Blood pressure (!) 139/96, pulse 56, temperature 98.2 F (36.8 C), temperature source Oral, resp. rate 16, weight 76.4 kg, SpO2 99 %.        Intake/Output Summary (Last 24 hours) at 06/29/2020 0943 Last data filed at 06/28/2020 2232 Gross per 24 hour  Intake 340 ml  Output 8000 ml  Net -7660 ml   Filed Weights   06/28/20 0657 06/28/20 1139 06/29/20 0610  Weight: 82.8 kg 76.7 kg 76.4 kg    Examination: General: Chronically ill appearing elderly male lying in bed in NAD HEENT: [place]/AT, MM pink/moist, PERRL, Sclera non-icteric  Neuro: Alert and oriented, able to follow simple commands  CV: s1s2 regular rate and rhythm, no murmur, rubs, or gallops,  PULM: Clear to ascultation bilaterally, diminished right lower base, oxygen saturations 96-99% on 3L [place], intermittent wet cough GI: soft, bowel sounds active in all 4 quadrants, non-tender, non-distended Extremities: warm/dry, no edema  Skin: no rashes or lesions  Resolved Hospital Problem list   n/a  Assessment & Plan:   Acute hypoxic respiratory failure requiring NIPPV secondary to pulm edema and bilateral pleural effusions --suspect transudative. Attributable to hypervolemic state in setting of missed HD sessions -As of AM of 7/29 fluid has reaccumulated with respiratory distress seen S/P right thoracentesis  -7/27--2377mL removed, fluid shows high protein but low LDH very few cells, borderline exudate -Some concern for SVC syndrome given dilated veins in upper  chest  Plan Chest tube unclamped this AM allow to drain per gravity  Routine chest tube care Continue iHD per nephrology, planning to diallyls again this am  Mobilize as able  Continue supplemental oxygen for SPO2 goal greater than 92 Wean oxygen as able, currently down to 3L [place] Encourage pulmonary  hygiene   Rest of acute and chronic medical conditions managed by priamary team   PCCM will continue to follow   Best practice:  Diet: Heart healthy  Pain/Anxiety/Delirium protocol (if indicated): PRN VAP protocol (if indicated): N/A DVT prophylaxis: Subq heparin  GI prophylaxis: PPI Glucose control: Monitor  Mobility: Bedrest  Code Status: Full Family Communication: Wife updated over the phone 7/28 Disposition: Floor   Labs   CBC: Recent Labs  Lab 06/26/20 0858 06/26/20 1907 06/26/20 2105 06/28/20 0543 06/29/20 0456  WBC 8.7 10.9*  --  8.0 8.6  HGB 11.1* 11.4* 12.2* 11.0* 11.7*  HCT 36.1* 35.4* 36.0* 36.1* 37.4*  MCV 91.9 91.2  --  92.8 92.8  PLT 241 194  --  195 604    Basic Metabolic Panel: Recent Labs  Lab 06/26/20 0858 06/26/20 0858 06/26/20 1907 06/26/20 2105 06/27/20 0500 06/28/20 0543 06/28/20 0909 06/29/20 0456  NA 138   < >  --  136 138 138 137 139  K 5.0   < >  --  4.3 4.2 4.0 4.0 3.5  CL 98  --   --   --  99 98 99 100  CO2 21*  --   --   --  25 27 23 28   GLUCOSE 97  --   --   --  89 103* 94 105*  BUN 56*  --   --   --  34* 30* 30* 23  CREATININE 10.14*   < > 6.79*  --  6.63* 6.09* 6.04* 5.40*  CALCIUM 8.4*  --   --   --  8.0* 8.0* 7.9* 8.1*  MG  --   --   --   --   --  2.2  --  2.1  PHOS  --   --   --   --   --  6.1* 6.3* 3.7   < > = values in this interval not displayed.   GFR: Estimated Creatinine Clearance: 13.6 mL/min (A) (by C-G formula based on SCr of 5.4 mg/dL (H)). Recent Labs  Lab 06/26/20 0858 06/26/20 0943 06/26/20 1907 06/28/20 0543 06/29/20 0456  WBC 8.7  --  10.9* 8.0 8.6  LATICACIDVEN  --  1.5 1.3  --   --     Liver Function Tests: Recent Labs  Lab 06/26/20 0858 06/27/20 1128 06/28/20 0543 06/28/20 0909 06/29/20 0456  AST 25  --   --   --   --   ALT 19  --   --   --   --   ALKPHOS 69  --   --   --   --   BILITOT 1.2  --   --   --   --   PROT 7.4 6.6  --   --   --   ALBUMIN 4.0  --  3.3* 3.3* 3.1*   No  results for input(s): LIPASE, AMYLASE in the last 168 hours. No results for input(s): AMMONIA in the last 168 hours.  ABG    Component Value Date/Time   PHART 7.435 06/26/2020 2105   PCO2ART 36.6 06/26/2020 2105   PO2ART 58 (L) 06/26/2020 2105   HCO3 24.5 06/26/2020  2105   TCO2 26 06/26/2020 2105   ACIDBASEDEF 8.4 (H) 04/12/2019 1801   O2SAT 90.0 06/26/2020 2105     Coagulation Profile: No results for input(s): INR, PROTIME in the last 168 hours.  Cardiac Enzymes: No results for input(s): CKTOTAL, CKMB, CKMBINDEX, TROPONINI in the last 168 hours.  HbA1C: No results found for: HGBA1C  CBG: No results for input(s): GLUCAP in the last 168 hours.    Signature:  Johnsie Cancel, NP-C [facility] Contact / Pager information can be found on Amion  06/29/2020, 9:43 AM    PCCM:  64 yo M, ESRD on iHD, Right effusion, s/p thora with recurrence, s/p right pigtail yesterday. Lots of drainage still.    Intake/Output Summary (Last 24 hours) at 06/29/2020 1112 Last data filed at 06/28/2020 2232 Gross per 24 hour  Intake 340 ml  Output 8000 ml  Net -7660 ml    BP (!) 139/96 (BP Location: Right Arm)   Pulse 56   Temp 98.2 F (36.8 C) (Oral)   Resp 16   Wt 76.4 kg   SpO2 99%   BMI 24.87 kg/m   Gen: elderly male, resting in bed  Chest: right pigtail drain  Heart: RRR s1 s2 Lungs: diminished in the right chest  Labs reviewed   A:  Right pleural effusion  Fluid overloaded  ESRD on iHD   P: Keep pigtail in place  Return to suction at -20 cmH20 Once rate drops we can consider pulling  Returns to HD today   Garner Nash, DO [facility] 06/29/2020 11:14 AM

## 2020-06-29 NOTE — Progress Notes (Signed)
Chest tube clamped per RN day shift, Sharyn Lull reported, MD requested  Chest tube to be under water sealed, draining by gravity and to clamp chest tube if pleural fluid output reaching up  to 2,000 ml, due to concerning about too much volume lost today..  Pt's hemodynamically stable, afebrile, alert and appeared more cooperated with nursing care, disoriented x 3.   Temp 98.6 - 99.0 F RR 24-28,  SPO2 98-99% on 7 LPM of HFNCL,  NSR on monitor, HR 60s,  BP 141/ 87 -160/ 89 mmHg    . No acute distress. Continue to monitor.  Kennyth Lose, RN

## 2020-06-29 NOTE — Progress Notes (Signed)
Patient ID: Devin Harmon, male   DOB: 1956-11-06, 64 y.o.   MRN: [id number]  [facility] Progress Note   Assessment/ Plan:   1. Acute hypoxic respiratory failure: Secondary to combination of pulmonary edema and large right pleural effusion stemming from his chronic nonadherence with prescribed hemodialysis and unrestricted interdialytic weight gain. He underwent thoracentesis and hemodialysis with need for emergent hemodialysis thereafter. Net neg 16.3L since admit.  Status post chest tube placement on 7/29 2. ESRD: On hemodialysis on a Monday/Wednesday/Friday schedule with pattern of chronic nonadherence with hemodialysis/truncated treatments and leaving over EDW.  We will plan for dialysis today to put him back on schedule 3. Anemia: Hemoglobin and hematocrit currently at goal, off ESA at this time. 4. CKD-MBD: Calcium level acceptable, will add phosphorus level to labs drawn earlier. We'll continue Auryxia for phosphorus binding with meals. Not on vitamin D receptor analog. Phos restriction in diet 5. Nutrition: Nutrition limited by current respiratory status, monitor for aspiration. 6. Hypertension: Elevated blood pressure noted, resumed oral antihypertensive therapy and attempting to lower dry weight. Will continue to challenge EDW  Discussed plan with wife at bedside.  Subjective:   Tolerated dialysis yesterday assessed with 6 L UF.  Chest tube placed. Wife at bedside   Objective:   BP (!) 139/96 (BP Location: Right Arm)   Pulse 56   Temp 98.2 F (36.8 C) (Oral)   Resp 16   Wt 76.4 kg   SpO2 99%   BMI 24.87 kg/m   Physical Exam: Gen: awake, NAD CVS: Pulse regular rhythm, normal rate, S1 and S2 normal Resp: right sided chest tube, diffuse coarse breath sounds, bilateral chest expansion Abd: Soft, obese, nontender Ext: Chronic lymphedema with superimposed trace pitting edema. Left upper arm AV fistula with palpable thrill. Neuro:  Disoriented  Labs: BMET Recent Labs  Lab 06/26/20 0858 06/26/20 1907 06/26/20 2105 06/27/20 0500 06/28/20 0543 06/28/20 0909 06/29/20 0456  NA 138  --  136 138 138 137 139  K 5.0  --  4.3 4.2 4.0 4.0 3.5  CL 98  --   --  99 98 99 100  CO2 21*  --   --  25 27 23 28   GLUCOSE 97  --   --  89 103* 94 105*  BUN 56*  --   --  34* 30* 30* 23  CREATININE 10.14* 6.79*  --  6.63* 6.09* 6.04* 5.40*  CALCIUM 8.4*  --   --  8.0* 8.0* 7.9* 8.1*  PHOS  --   --   --   --  6.1* 6.3* 3.7   CBC Recent Labs  Lab 06/26/20 0858 06/26/20 0858 06/26/20 1907 06/26/20 2105 06/28/20 0543 06/29/20 0456  WBC 8.7  --  10.9*  --  8.0 8.6  HGB 11.1*   < > 11.4* 12.2* 11.0* 11.7*  HCT 36.1*   < > 35.4* 36.0* 36.1* 37.4*  MCV 91.9  --  91.2  --  92.8 92.8  PLT 241  --  194  --  195 188   < > = values in this interval not displayed.     Medications:    . amLODipine  10 mg Oral Daily  . Chlorhexidine Gluconate Cloth  6 each Topical Q0600  . ferric citrate  420 mg Oral TID WC  . heparin  5,000 Units Subcutaneous Q8H  . labetalol  100 mg Oral BID  . multivitamin  1 tablet Oral Daily  . sodium chloride flush  3  mL Intravenous Once   Gean Quint, MD [facility] 06/29/2020, 9:36 AM

## 2020-06-29 NOTE — Evaluation (Signed)
Physical Therapy Evaluation Patient Details Name: Devin Harmon MRN: [id number] DOB: 08-Dec-1955 Today's Date: 06/29/2020   History of Present Illness  Pt adm with acute respiratory failure due to large rt pleural effusion and pulmonary edema due to hpervolemic state due to missed HD. Pt underwent thoracentensis on 7/27 and chest tube placed 7/29. Pt also on bipap at times. PMH - ESRD on HD, HTN  Clinical Impression  Pt presents to PT with significant decrease in mobility. Suspect most of this decline is due to current poor cognition and lethargy. Expect mobility will improve as cognition improves. If is doesn't may need to look at other options for DC but hopefully will be able to return home with wife.     Follow Up Recommendations Home health PT;Supervision/Assistance - 24 hour (if cognition and mobility improve)    Equipment Recommendations  Other (comment) (To be determined)    Recommendations for Other Services       Precautions / Restrictions Precautions Precautions: Fall;Other (comment) Precaution Comments: chest tube      Mobility  Bed Mobility Overal bed mobility: Needs Assistance Bed Mobility: Supine to Sit;Sit to Supine     Supine to sit: Mod assist;+2 for safety/equipment;HOB elevated Sit to supine: +2 for physical assistance;Mod assist   General bed mobility comments: Pt with difficulty processing what I was asking him to do. Assist to bring legs off of the bed and bring trunk up into sitting. Assist to lower trunk and bring legs back up into bed.   Transfers Overall transfer level: Needs assistance Equipment used: Rolling walker (2 wheeled) Transfers: Sit to/from Stand Sit to Stand: +2 physical assistance;Mod assist         General transfer comment: Assist to bring hips up and for balance. Pt slow to initiate stand.   Ambulation/Gait             General Gait Details: did not attempt  Stairs            Wheelchair Mobility    Modified  Rankin (Stroke Patients Only)       Balance Overall balance assessment: Needs assistance Sitting-balance support: No upper extremity supported;Feet supported Sitting balance-Leahy Scale: Fair     Standing balance support: Bilateral upper extremity supported Standing balance-Leahy Scale: Poor Standing balance comment: walker and mod to min assist to stand for 2 minutes. Pt initially with flexed hips but able to extend with cues. Pt with posterior and rt lean.                              Pertinent Vitals/Pain Pain Assessment: Faces Faces Pain Scale: No hurt    Home Living Family/patient expects to be discharged to:: Private residence Living Arrangements: Spouse/significant other Available Help at Discharge: Family Type of Home: House Home Access: Stairs to enter Entrance Stairs-Rails: Psychiatric nurse of Steps: 5 Home Layout: One level Home Equipment: None      Prior Function Level of Independence: Independent               Hand Dominance        Extremity/Trunk Assessment        Lower Extremity Assessment Lower Extremity Assessment: Generalized weakness       Communication      Cognition Arousal/Alertness: Lethargic;Awake/alert Behavior During Therapy: Flat affect Overall Cognitive Status: Impaired/Different from baseline Area of Impairment: Problem solving;Awareness;Safety/judgement;Following commands;Memory;Attention;Orientation  Orientation Level: Disoriented to;Time;Situation Current Attention Level: Sustained Memory: Decreased short-term memory Following Commands: Follows one step commands inconsistently;Follows one step commands with increased time Safety/Judgement: Decreased awareness of safety;Decreased awareness of deficits Awareness: Intellectual Problem Solving: Slow processing;Decreased initiation;Difficulty sequencing;Requires verbal cues;Requires tactile cues General Comments: Pt  lethargic initially but did arouse for session      General Comments      Exercises     Assessment/Plan    PT Assessment Patient needs continued PT services  PT Problem List Decreased strength;Decreased activity tolerance;Decreased balance;Decreased mobility;Decreased cognition;Decreased safety awareness       PT Treatment Interventions DME instruction;Gait training;Functional mobility training;Therapeutic activities;Therapeutic exercise;Balance training;Cognitive remediation;Patient/family education;Stair training    PT Goals (Current goals can be found in the Care Plan section)  Acute Rehab PT Goals Patient Stated Goal: not stated PT Goal Formulation: With patient/family Time For Goal Achievement: 07/13/20 Potential to Achieve Goals: Fair    Frequency Min 3X/week   Barriers to discharge Inaccessible home environment stairs to enter    Co-evaluation               AM-PAC PT "6 Clicks" Mobility  Outcome Measure Help needed turning from your back to your side while in a flat bed without using bedrails?: A Lot Help needed moving from lying on your back to sitting on the side of a flat bed without using bedrails?: A Lot Help needed moving to and from a bed to a chair (including a wheelchair)?: Total Help needed standing up from a chair using your arms (e.g., wheelchair or bedside chair)?: A Lot Help needed to walk in hospital room?: Total Help needed climbing 3-5 steps with a railing? : Total 6 Click Score: 9    End of Session Equipment Utilized During Treatment: Oxygen Activity Tolerance: Patient limited by fatigue Patient left: in bed;with call bell/phone within reach;with bed alarm set;with family/visitor present Nurse Communication: Mobility status PT Visit Diagnosis: Other abnormalities of gait and mobility (R26.89);Muscle weakness (generalized) (M62.81)    Time: 4174-0814 PT Time Calculation (min) (ACUTE ONLY): 17 min   Charges:   PT Evaluation $PT Eval  Moderate Complexity: [place] Pager [phone] Office [place] 06/29/2020, 12:28 PM

## 2020-06-29 NOTE — Progress Notes (Signed)
OT Cancellation Note  Patient Details Name: Devin Harmon MRN: [id number] DOB: 1956-04-06   Cancelled Treatment:    Reason Eval/Treat Not Completed: Patient at procedure or test/ unavailable (Pt is at HD at this time, will return once pt is available and appropriate)  Zenovia Jarred, MSOT, OTR/L [facility] Office Number: [phone] Pager: [phone]  Zenovia Jarred 06/29/2020, 5:12 PM

## 2020-06-30 LAB — RENAL FUNCTION PANEL
Albumin: 2.9 g/dL — ABNORMAL LOW (ref 3.5–5.0)
Albumin: 2.9 g/dL — ABNORMAL LOW (ref 3.5–5.0)
Anion gap: 7 (ref 5–15)
Anion gap: 12 (ref 5–15)
BUN: 28 mg/dL — ABNORMAL HIGH (ref 8–23)
BUN: 37 mg/dL — ABNORMAL HIGH (ref 8–23)
CO2: 31 mmol/L (ref 22–32)
CO2: 25 mmol/L (ref 22–32)
Calcium: 8 mg/dL — ABNORMAL LOW (ref 8.9–10.3)
Calcium: 8 mg/dL — ABNORMAL LOW (ref 8.9–10.3)
Chloride: 103 mmol/L (ref 98–111)
Chloride: 104 mmol/L (ref 98–111)
Creatinine, Ser: 5.51 mg/dL — ABNORMAL HIGH (ref 0.61–1.24)
Creatinine, Ser: 6.72 mg/dL — ABNORMAL HIGH (ref 0.61–1.24)
GFR calc Af Amer: 12 mL/min — ABNORMAL LOW (ref >60)
GFR calc Af Amer: 9 mL/min — ABNORMAL LOW (ref >60)
GFR calc non Af Amer: 10 mL/min — ABNORMAL LOW (ref >60)
GFR calc non Af Amer: 8 mL/min — ABNORMAL LOW (ref >60)
Glucose, Bld: 116 mg/dL — ABNORMAL HIGH (ref 70–99)
Glucose, Bld: 142 mg/dL — ABNORMAL HIGH (ref 70–99)
Phosphorus: 3.4 mg/dL (ref 2.5–4.6)
Phosphorus: 2.8 mg/dL (ref 2.5–4.6)
Potassium: 3.6 mmol/L (ref 3.5–5.1)
Potassium: 3.8 mmol/L (ref 3.5–5.1)
Sodium: 141 mmol/L (ref 135–145)
Sodium: 141 mmol/L (ref 135–145)

## 2020-06-30 LAB — MAGNESIUM: Magnesium: 2.1 mg/dL (ref 1.7–2.4)

## 2020-06-30 NOTE — Progress Notes (Signed)
PROGRESS NOTE        PATIENT DETAILS Name: Devin Harmon Age: 64 y.o. Sex: male Date of Birth: 10-22-1956 Admit Date: 06/26/2020 Admitting Physician Guilford Shi, MD [id number], Theophilus Kinds, MD  Brief Narrative: Patient is a 64 y.o. male with history of ESRD on HD MWF (per family-frequently cuts short dialysis sessions)-presented with shortness of breath, lower extremity edema-was found to have acute hypoxic respiratory failure secondary to volume overload/pleural effusion.  He was seen by PCCM-underwent thoracocentesis-and was seen by nephrology and underwent urgent HD.  Unfortunately he continued to have persistent hypoxic respiratory failure-PCCM subsequently placed chest tube on 7/29 with marked improvement in respiratory status.  See below for further details.    Significant events: 7/27>> admit to [facility] for SOB/volume overload/pleural effusion-briefly required BiPAP on admission.  Significant studies: 7/27>> chest x-ray: Large right-sided pleural effusion, mild pulmonary venous congestion 7/28>> echo: EF 46-50%, grade 1 diastolic dysfunction 3/54>> chest x-ray: Interval placement of right chest tube-with significant decrease in right pleural effusion  Antimicrobial therapy: None  Microbiology data: 7/27>> blood culture: No growth  Pathology:  Pleural fluid-reactive mesothelial cells  Procedures : 7/27>> thoracocentesis 7/29>> right-sided chest tube by PCCM  Consults: Nephrology, pulmonology  DVT Prophylaxis : heparin injection 5,000 Units Start: 06/26/20 1400  Subjective: Given Ativan last night-sleepy this morning-but subsequently has woken up and become more awake and alert.  Assessment/Plan: Acute hypoxic respiratory failure: Secondary to volume overload/pulmonary edema/decompensated systolic heart failure and large right-sided pleural effusion due to underdialysis (per family-frequently cuts short dialysis sessions).  Respiratory  failure has markedly improved post chest tube placement and numerous HD treatments.  Continue attempts to slowly titrate down FiO2-PCCM following and managing chest tube.    Newly diagnosed decompensated systolic heart failure: Volume status markedly improved-heart failure provoked by under dialysis.  He also has some wall motion abnormalities on echo-suspect since patient's volume status is markedly improved with chest tube placement/hemodialysis-further cardiology work-up can be deferred to the outpatient setting.  ESRD: Anasarca has improved after numerous HD-he is now -21 L.  Nephrology following.   Anemia of chronic disease: Hemoglobin stable-no indication for transfusion-follow  HTN: BP controlled-continue amlodipine/labetalol  History of mild right-sided hemiparesis/dysarthria from prior CVA with some intermittent delirium: Per spouse at bedside on 7/28-he is currently at baseline in terms of his speech and right-sided weakness.  Has had some intermittent agitation at times requiring IV Ativan-this morning he was slightly difficult to arouse-but mental status is improved over the past few hours.  Diet: Diet Order            Diet Heart Room service appropriate? Yes; Fluid consistency: Thin  Diet effective now                  Code Status: Full code   Family Communication: Spouse over the phone 7/31  Disposition Plan: Status is: Inpatient  Remains inpatient appropriate because:Inpatient level of care appropriate due to severity of illness   Dispo: The patient is from: Home              Anticipated d/c is to: Home              Anticipated d/c date is: 3 days              Patient currently is not medically stable to d/c.   Barriers  to Discharge: Volume overload/respiratory failure from under dialysis with large right-sided pleural effusion-chest tube remains in place.  Antimicrobial agents: Anti-infectives (From admission, onward)   Start     Dose/Rate Route Frequency  Ordered Stop   06/26/20 1000  cefTRIAXone (ROCEPHIN) 1 g in sodium chloride 0.9 % 100 mL IVPB        1 g 200 mL/hr over 30 Minutes Intravenous  Once 06/26/20 0947 06/26/20 1106   06/26/20 1000  azithromycin (ZITHROMAX) 500 mg in sodium chloride 0.9 % 250 mL IVPB  Status:  Discontinued        500 mg 250 mL/hr over 60 Minutes Intravenous  Once 06/26/20 0947 06/26/20 1228       Time spent: 25- minutes-Greater than 50% of this time was spent in counseling, explanation of diagnosis, planning of further management, and coordination of care.  MEDICATIONS: Scheduled Meds: . amLODipine  10 mg Oral Daily  . Chlorhexidine Gluconate Cloth  6 each Topical Q0600  . ferric citrate  420 mg Oral TID WC  . heparin  5,000 Units Subcutaneous Q8H  . labetalol  100 mg Oral BID  . multivitamin  1 tablet Oral Daily  . sodium chloride flush  10 mL Intracatheter Q8H  . sodium chloride flush  3 mL Intravenous Once   Continuous Infusions: PRN Meds:.acetaminophen, albuterol, fluticasone, hydrALAZINE, loratadine, LORazepam, ondansetron **OR** ondansetron (ZOFRAN) IV, polyethylene glycol   PHYSICAL EXAM: Vital signs: Vitals:   06/30/20 0046 06/30/20 0535 06/30/20 0700 06/30/20 0736  BP: 127/69   (!) 144/71  Pulse: 61   60  Resp: 16   19  Temp: 98.4 F (36.9 C) 98.4 F (36.9 C)  98.1 F (36.7 C)  TempSrc:  Oral  Oral  SpO2:    100%  Weight:   68.6 kg   Height:   5\' 9"  (1.753 m)    Filed Weights   06/29/20 1554 06/29/20 1954 06/30/20 0700  Weight: 71 kg 68.5 kg 68.6 kg   Body mass index is 22.33 kg/m.   Gen Exam:Alert awake-not in any distress HEENT:atraumatic, normocephalic Chest: B/L clear to auscultation anteriorly CVS:S1S2 regular Abdomen:soft non tender, non distended Extremities:trace edema Neurology: Non focal Skin: no rash  I have personally reviewed following labs and imaging studies  LABORATORY DATA: CBC: Recent Labs  Lab 06/26/20 0858 06/26/20 1907 06/26/20 2105  06/28/20 0543 06/29/20 0456  WBC 8.7 10.9*  --  8.0 8.6  HGB 11.1* 11.4* 12.2* 11.0* 11.7*  HCT 36.1* 35.4* 36.0* 36.1* 37.4*  MCV 91.9 91.2  --  92.8 92.8  PLT 241 194  --  195 734    Basic Metabolic Panel: Recent Labs  Lab 06/28/20 0543 06/28/20 0909 06/29/20 0456 06/29/20 2225 06/30/20 0215  NA 138 137 139 140 141  K 4.0 4.0 3.5 3.6 3.6  CL 98 99 100 101 103  CO2 27 23 28 30 31   GLUCOSE 103* 94 105* 136* 116*  BUN 30* 30* 23 25* 28*  CREATININE 6.09* 6.04* 5.40* 5.21* 5.51*  CALCIUM 8.0* 7.9* 8.1* 8.1* 8.0*  MG 2.2  --  2.1  --  2.1  PHOS 6.1* 6.3* 3.7 2.6 3.4    GFR: Estimated Creatinine Clearance: 13 mL/min (A) (by C-G formula based on SCr of 5.51 mg/dL (H)).  Liver Function Tests: Recent Labs  Lab 06/26/20 2876 06/26/20 8115 06/27/20 1128 06/28/20 0543 06/28/20 0909 06/29/20 0456 06/29/20 2225 06/30/20 0215  AST 25  --   --   --   --   --   --   --  ALT 19  --   --   --   --   --   --   --   ALKPHOS 69  --   --   --   --   --   --   --   BILITOT 1.2  --   --   --   --   --   --   --   PROT 7.4  --  6.6  --   --   --   --   --   ALBUMIN 4.0   < >  --  3.3* 3.3* 3.1* 2.9* 2.9*   < > = values in this interval not displayed.   No results for input(s): LIPASE, AMYLASE in the last 168 hours. No results for input(s): AMMONIA in the last 168 hours.  Coagulation Profile: No results for input(s): INR, PROTIME in the last 168 hours.  Cardiac Enzymes: No results for input(s): CKTOTAL, CKMB, CKMBINDEX, TROPONINI in the last 168 hours.  BNP (last 3 results) No results for input(s): PROBNP in the last 8760 hours.  Lipid Profile: No results for input(s): CHOL, HDL, LDLCALC, TRIG, CHOLHDL, LDLDIRECT in the last 72 hours.  Thyroid Function Tests: No results for input(s): TSH, T4TOTAL, FREET4, T3FREE, THYROIDAB in the last 72 hours.  Anemia Panel: No results for input(s): VITAMINB12, FOLATE, FERRITIN, TIBC, IRON, RETICCTPCT in the last 72 hours.  Urine  analysis:    Component Value Date/Time   COLORURINE STRAW (A) 08/27/2018 2129   APPEARANCEUR CLEAR 08/27/2018 2129   LABSPEC 1.011 08/27/2018 2129   PHURINE 6.0 08/27/2018 2129   GLUCOSEU 50 (A) 08/27/2018 2129   HGBUR SMALL (A) 08/27/2018 2129   BILIRUBINUR NEGATIVE 08/27/2018 2129   KETONESUR 5 (A) 08/27/2018 2129   PROTEINUR >=300 (A) 08/27/2018 2129   NITRITE NEGATIVE 08/27/2018 2129   LEUKOCYTESUR NEGATIVE 08/27/2018 2129    Sepsis Labs: Lactic Acid, Venous    Component Value Date/Time   LATICACIDVEN 1.3 06/26/2020 1907    MICROBIOLOGY: Recent Results (from the past 240 hour(s))  Blood culture (routine x 2)     Status: None (Preliminary result)   Collection Time: 06/26/20  9:43 AM   Specimen: BLOOD RIGHT FOREARM  Result Value Ref Range Status   Specimen Description BLOOD RIGHT FOREARM  Final   Special Requests   Final    BOTTLES DRAWN AEROBIC AND ANAEROBIC Blood Culture adequate volume   Culture   Final    NO GROWTH 4 DAYS Performed at [facility], [address], [place], [place] [address]    Report Status PENDING  Incomplete  Blood culture (routine x 2)     Status: None (Preliminary result)   Collection Time: 06/26/20  9:48 AM   Specimen: BLOOD  Result Value Ref Range Status   Specimen Description BLOOD RIGHT ANTECUBITAL  Final   Special Requests   Final    BOTTLES DRAWN AEROBIC AND ANAEROBIC Blood Culture adequate volume   Culture   Final    NO GROWTH 4 DAYS Performed at [facility], [place] [address], [place], [place] [address]    Report Status PENDING  Incomplete  SARS Coronavirus 2 by RT PCR     Status: None   Collection Time: 06/26/20  9:53 AM  Result Value Ref Range Status   SARS Coronavirus 2 NEGATIVE NEGATIVE Final    Comment: (NOTE) Result indicates the ABSENCE of SARS-CoV-2 RNA in the patient specimen.   The lowest concentration of  SARS-CoV-2 viral copies this assay can detect in nasopharyngeal swab specimens is 500 copies / mL.   A negative result does not preclude SARS-CoV-2 infection and should not be used as the sole basis for patient management decisions. A negative result may occur with improper specimen collection / handling, submission of a specimen other than nasopharyngeal swab, presence of viral mutation(s) within the areas targeted by this assay, and inadequate number of viral copies (<500 copies / mL) present.  Negative results must be combined with clinical observations, patient history, and epidemiological information.   The expected result is NEGATIVE.   Patient Fact Sheet:  BlogSelections.co.uk    Provider Fact Sheet:  https://lucas.com/    This test is not yet approved or cleared by the [place] FDA and  has been [place] orized for detection and/or diagnosis of SARS-CoV-2 by FDA under an Emergency Use Authorization (EUA).  This EUA will remain in effect (meaning this test can be used) for the duration of  the COVID-19 declaration under Section 564(b)(1) of the Act, 21 U.S.C. section 360bbb-3(b)(1), unless the authorization is terminated or revoked sooner  Performed at [facility], [place] [address], [place], [place] [address]   Body fluid culture     Status: None   Collection Time: 06/26/20 12:31 PM   Specimen: Pleura; Body Fluid  Result Value Ref Range Status   Specimen Description PLEURAL FLUID RIGHT LUNG  Final   Special Requests NONE  Final   Gram Stain   Final    RARE WBC PRESENT, PREDOMINANTLY MONONUCLEAR NO ORGANISMS SEEN    Culture   Final    NO GROWTH 3 DAYS Performed at [facility], [address] [place], [place] [address]    Report Status 06/29/2020 FINAL  Final    RADIOLOGY STUDIES/RESULTS: No results found.   LOS: 4 days   Oren Binet, MD  [facility]    To contact the attending provider between 7A-7P or the covering provider during after hours 7P-7A, please log into the web site  [email or web address] and access using universal [place] password for that web site. If you do not have the password, please call the hospital operator.  06/30/2020, 3:22 PM

## 2020-06-30 NOTE — Progress Notes (Signed)
Patient refuses use of Bipap for the evening. Rt will continue to monitor.

## 2020-06-30 NOTE — Progress Notes (Addendum)
Patient ID: Devin Harmon, male   DOB: 1956-01-31, 64 y.o.   MRN: [id number]  [facility] Progress Note   Assessment/ Plan:   1. Acute hypoxic respiratory failure: Secondary to combination of pulmonary edema and large right pleural effusion stemming from his chronic nonadherence with prescribed hemodialysis and unrestricted interdialytic weight gain. He underwent thoracentesis and hemodialysis with need for emergent hemodialysis thereafter. Substantially neg after admission and serial HD/UF.  Status post chest tube placement on 7/29  2. ESRD: On outpatient hemodialysis on a Monday/Wednesday/Friday schedule with pattern of chronic nonadherence with hemodialysis/truncated treatments and leaving over EDW.  S/p daily HD to optimize and now back on MWF schedule   3. Hypertension: improved with UF.  Continue current regimen    4. Anemia 2/2 ESRD: Hemoglobin and hematocrit currently at goal, off ESA at this time.  5. CKD-MBD:  hyperphos improved.  continue Auryxia for phosphorus binding with meals. Not on vitamin D receptor analog. Phos restriction in diet  6. Nutrition: Nutrition limited by current respiratory status, monitor for aspiration. Noted heart diet is currently ordered - will make NPO for now given mental status.  RN is getting in touch with team re: plan for nutrition    Subjective:    Last HD on 7/30 with 2.5 kg UF.  He was difficult to arouse as below and spoke with his rn who states that he has been at his recent baseline this morning.    Review of systems:   unable to obtain 2/2 AMS    Objective:   BP (!) 144/71 (BP Location: Left Arm)   Pulse 60   Temp 98.1 F (36.7 C) (Oral)   Resp 19   Ht 5\' 9"  (1.753 m)   Wt 68.6 kg   SpO2 100%   BMI 22.33 kg/m   Physical Exam: Gen: adult chronically ill male lying in bed  CVS: Pulse regular rhythm, normal rate, S1 and S2 Resp: right sided chest tube, diffuse coarse breath sounds, bilateral chest expansion Abd:  Soft, nontender/ nd Ext: Chronic lymphedema with superimposed trace pitting edema. Left upper arm AV fistula with bruit and thrill. Neuro: opens eyes to sternal rub and abd pressure.  Nonverbal with me (confirmed this is baseline recently per his RN and per her report from prior nurses)  Labs: BMET Recent Labs  Lab 06/26/20 0858 06/26/20 5885 06/26/20 1907 06/26/20 2105 06/27/20 0500 06/28/20 0543 06/28/20 0909 06/29/20 0456 06/29/20 2225 06/30/20 0215  NA 138   < >  --  136 138 138 137 139 140 141  K 5.0   < >  --  4.3 4.2 4.0 4.0 3.5 3.6 3.6  CL 98  --   --   --  99 98 99 100 101 103  CO2 21*  --   --   --  25 27 23 28 30 31   GLUCOSE 97  --   --   --  89 103* 94 105* 136* 116*  BUN 56*  --   --   --  34* 30* 30* 23 25* 28*  CREATININE 10.14*   < > 6.79*  --  6.63* 6.09* 6.04* 5.40* 5.21* 5.51*  CALCIUM 8.4*  --   --   --  8.0* 8.0* 7.9* 8.1* 8.1* 8.0*  PHOS  --   --   --   --   --  6.1* 6.3* 3.7 2.6 3.4   < > = values in this interval not displayed.   CBC  Recent Labs  Lab 06/26/20 0858 06/26/20 0858 06/26/20 1907 06/26/20 2105 06/28/20 0543 06/29/20 0456  WBC 8.7  --  10.9*  --  8.0 8.6  HGB 11.1*   < > 11.4* 12.2* 11.0* 11.7*  HCT 36.1*   < > 35.4* 36.0* 36.1* 37.4*  MCV 91.9  --  91.2  --  92.8 92.8  PLT 241  --  194  --  195 188   < > = values in this interval not displayed.     Medications:    . amLODipine  10 mg Oral Daily  . Chlorhexidine Gluconate Cloth  6 each Topical Q0600  . ferric citrate  420 mg Oral TID WC  . heparin  5,000 Units Subcutaneous Q8H  . labetalol  100 mg Oral BID  . multivitamin  1 tablet Oral Daily  . sodium chloride flush  10 mL Intracatheter Q8H  . sodium chloride flush  3 mL Intravenous Once   Dialysis Orders:  MWF - [facility]  4.5 hrs, BFR 400, DFR 800,  EDW 91.5kg, 2K/ 2.5Ca  Access: TDC. LU AVF maturing - ready for use starting 06/16/20 Heparin 3000 Mircera 100 mcg q2wks - last 06/20/20 Venofer 100mg  qwk  Calcitriol 1.40mcg  PO qHD Auryxia 2 tab AC TID  Claudia Desanctis, MD [facility] 06/30/2020, 8:13 AM

## 2020-06-30 NOTE — Progress Notes (Signed)
PCCM Interval Note  High output transudative fluid from chest tube. Suspect that this will continue - not clear to me that he will achieve euvolemia with HD to a point that will allow output to decrease. We may find ourselves in position tat we need to consider referral for VATS pleurodesis depending on trend. Will follow output with more HD sessions and then consider options.   Baltazar Apo, MD, PhD 06/30/2020, 4:06 PM [facility] [phone] or if no answer [phone]

## 2020-06-30 NOTE — Progress Notes (Addendum)
PROGRESS NOTE    Devin Harmon  EXN:[id number] DOB: 12-20-1955 DOA: 06/26/2020 PCP: Venia Carbon, MD  Brief Narrative:  64 year old black male Previous herpes zoster 2007 ESRD since 2019 however started dialysis 04/17/2019 admission TTS through right sided PermCath Poorly controlled hypertension Anemia of renal disease Presented to the emergency room 06/26/2020 severely short of breath patient was on BiPAP at the time CCM consulted secondary to large right-sided pleural effusion pulmonary edema in the setting of missed dialysis Right thoracentesis revealed 2.3 L clear yellow fluid-by lites creatinine seems translated BiPAP has been weaned to 3 L of oxygen  Assessment & Plan:   Active Problems:   Essential hypertension, benign   BPH without obstruction/lower urinary tract symptoms   End stage renal disease (HCC)   Pleural effusion   Acute respiratory failure with hypoxia ([place])   1. Hypoxic respiratory failure secondary to large right-sided transudative pleural effusion a. 2/2 under dialysis/decompensated systolic failure b. Much better without oxygen requirement currently c. Appreciate CCM continue to follow-up--may be considering talc pleurodesis?  Output from drain seems lower than past several days however d. Will obtain portable chest x-ray a.m. to determine how much fluid is left in prior to decisions e. Defer other imaging such as CT etc. to pulmonary 2. New systolic heart failure a. Defer as an outpatient to cardiology working this up b. Prior thought with this was secondary to large effusion causing some wall mobility issues c. Needs an echo in 3 to 6 months d. Weight is down to 68.6 on admission was 83 kg e. Has some PVCs and 3-5 beats of SVT on monitors so keep on telemetry today 3. ESRD TTS (chronically nonadherent) has left upper arm AV fistula a. Appreciate nephrology input has been daily dialyzing 7/27 through 7/30 4. Anemia renal disease a. Repeat CBC  a.m. b. Last labs show normocytic anemia 5. Poorly controlled hypertension a. Continue amlodipine 10, labetalol 100 twice daily, amlodipine 10 b. Control is moderate c. May benefit from addition of ACE?  Monitor 6. Prior right-sided hemiparesis from CVA and possible post infarct sequelae with post infarct related cognitive slowing a. Apparently he is more cognizant at home b. Wife declined skilled option and says she will manage him at home  DVT prophylaxis: Heparin Code Status: Full Family Communication:  Disposition: OT evaluation and PT evaluation recommending skilled-unclear if ready because of large volume coming out of drain  Status is: Inpatient  Remains inpatient appropriate because:Unsafe d/c plan, IV treatments appropriate due to intensity of illness or inability to take PO and Inpatient level of care appropriate due to severity of illness Patient is somewhat incoherent and still has a chest tube in addition still has high drainage from this he is not stable for discharge at this time-based on my discussion with wife at bedside 8/1 she wants to take him home and is requesting that we figure out a way to get the fluid in the lungs better I have explained to her that this is a process and that we need to see if there is a need for further intervention such as talc pleurodesis and explained this to her name and terms with the nurse present-she seems to understand by this quite concerned about how long he may need to stay here as the patient continuously requests to go home  Dispo: The patient is from: Home              Anticipated d/c is to: Home  Anticipated d/c date is: > 3 days              Patient currently is not medically stable to d/c.       Consultants:   Nephrology  Critical care medicine  Procedures: Chest tube placement  Antimicrobials: None   Subjective: Edentulous and difficult to understand his speech Hemiparesis noted  "I want to go home"  is the main thing I understand from his discussion with me  Objective: Vitals:   06/30/20 0000 06/30/20 0046 06/30/20 0535 06/30/20 0700  BP:  127/69    Pulse: 61 61    Resp: 16 16    Temp:  98.4 F (36.9 C) 98.4 F (36.9 C)   TempSrc:   Oral   SpO2: 100%     Weight:    68.6 kg  Height:    5\' 9"  (1.753 m)    Intake/Output Summary (Last 24 hours) at 06/30/2020 0736 Last data filed at 06/30/2020 0533 Gross per 24 hour  Intake 5 ml  Output 5250 ml  Net -5245 ml   Filed Weights   06/29/20 1554 06/29/20 1954 06/30/20 0700  Weight: 71 kg 68.5 kg 68.6 kg    Examination:  General exam: Awake alert coherent to person-cannot obtain further ROS Respiratory system: Decreased air entry right lung field Cardiovascular system: S1-S2 no murmur see above Gastrointestinal system: Soft nontender. Central nervous system: Hemiparetic otherwise within normal limits but some  slurred speech Extremities: No lower extremity edema Skin: As above Psychiatry: Euthymic  Data Reviewed: I have personally reviewed following labs and imaging studies  BUN/creatinine 46/7.8 Hemoglobin 11.7 platelet 188  Radiology Studies: DG CHEST PORT 1 VIEW  Result Date: 06/28/2020 CLINICAL DATA:  64 year old male status post chest tube placement on the right. EXAM: PORTABLE CHEST 1 VIEW COMPARISON:  Earlier radiograph dated 06/28/2020. FINDINGS: Interval placement of a right-sided chest tube with significant decrease in the right pleural effusion and partial aeration of the right lung. No pneumothorax. The dialysis catheter is in similar position. There is cardiomegaly. Atherosclerotic calcification of the aorta. No acute osseous pathology. IMPRESSION: Interval placement of a right-sided chest tube with significant decrease in the right pleural effusion and partial aeration of the right lung. Electronically Signed   By: Anner Crete M.D.   On: 06/28/2020 16:21     Scheduled Meds: . amLODipine  10 mg Oral Daily   . Chlorhexidine Gluconate Cloth  6 each Topical Q0600  . ferric citrate  420 mg Oral TID WC  . heparin  5,000 Units Subcutaneous Q8H  . labetalol  100 mg Oral BID  . multivitamin  1 tablet Oral Daily  . sodium chloride flush  10 mL Intracatheter Q8H  . sodium chloride flush  3 mL Intravenous Once   Continuous Infusions:   LOS: 4 days    Time spent: [place], MD [facility] To contact the attending provider between 7A-7P or the covering provider during after hours 7P-7A, please log into the web site [email or web address] and access using universal [place] password for that web site. If you do not have the password, please call the hospital operator.  06/30/2020, 7:36 AM

## 2020-06-30 NOTE — Evaluation (Signed)
Occupational Therapy Evaluation Patient Details Name: Devin Harmon MRN: [id number] DOB: 1956-05-19 Today's Date: 06/30/2020    History of Present Illness Pt adm with acute respiratory failure due to large rt pleural effusion and pulmonary edema due to hpervolemic state due to missed HD. Pt underwent thoracentensis on 7/27 and chest tube placed 7/29. Pt also on bipap at times. PMH - ESRD on HD, HTN   Clinical Impression   PT admitted with R pleural effusion with thoracentensis. Pt currently with functional limitiations due to the deficits listed below (see OT problem list). Pt progressed to EOB with mod (A) and static sitting eob. Pt declines standing attempts. Pt followed 1 step command with increased time.  Pt will benefit from skilled OT to increase their independence and safety with adls and balance to allow discharge SNF.     Follow Up Recommendations  SNF    Equipment Recommendations  None recommended by OT    Recommendations for Other Services       Precautions / Restrictions Precautions Precautions: Fall;Other (comment) Precaution Comments: chest tube      Mobility Bed Mobility Overal bed mobility: Needs Assistance Bed Mobility: Supine to Sit;Sit to Supine     Supine to sit: Mod assist Sit to supine: Mod assist   General bed mobility comments: pt requires (A) to initiate bil LE to eob. Pt requries (A) to elevate from bed. pt static sitting min guard (A). pt requires (A) to return to supine with (A) for bil Le. pt max (A) to slide to EOB  Transfers                 General transfer comment: declined    Balance                                           ADL either performed or assessed with clinical judgement   ADL Overall ADL's : Needs assistance/impaired     Grooming: Wash/dry face;Set up;Sitting                                 General ADL Comments: pt following simple command. pt progressed to eob but declined to  attempt to stand.      Vision   Additional Comments: pt tracking therapist around the bed      Perception     Praxis      Pertinent Vitals/Pain Pain Assessment: No/denies pain     Hand Dominance Right   Extremity/Trunk Assessment Upper Extremity Assessment Upper Extremity Assessment: Generalized weakness       Cervical / Trunk Assessment Cervical / Trunk Assessment: Kyphotic   Communication Communication Communication: Expressive difficulties   Cognition Arousal/Alertness: Awake/alert Behavior During Therapy: Flat affect Overall Cognitive Status: Impaired/Different from baseline                                 General Comments: pt states "i want to go home" multiple times during session. pt follow simple command to wash face   General Comments  96% RA     Exercises     Shoulder Instructions      Home Living Family/patient expects to be discharged to:: Private residence Living Arrangements: Spouse/significant other Available Help at Discharge: Family Type of Home: House  Home Access: Stairs to enter Entrance Stairs-Number of Steps: 5 Entrance Stairs-Rails: Right;Left Home Layout: One level     Bathroom Shower/Tub: Tub/shower unit         Home Equipment: None   Additional Comments: home information pulled from PT chart evaluation      Prior Functioning/Environment Level of Independence: Independent                 OT Problem List: Decreased strength;Decreased activity tolerance;Impaired balance (sitting and/or standing);Decreased cognition;Decreased safety awareness;Decreased knowledge of use of DME or AE;Decreased knowledge of precautions;Cardiopulmonary status limiting activity      OT Treatment/Interventions: Self-care/ADL training;Therapeutic exercise;DME and/or AE instruction;Energy conservation;Therapeutic activities;Cognitive remediation/compensation;Patient/family education;Balance training    OT Goals(Current goals can  be found in the care plan section) Acute Rehab OT Goals Patient Stated Goal: to go home OT Goal Formulation: Patient unable to participate in goal setting Time For Goal Achievement: 07/14/20 Potential to Achieve Goals: Good  OT Frequency: Min 2X/week   Barriers to D/C:            Co-evaluation              AM-PAC OT "6 Clicks" Daily Activity     Outcome Measure Help from another person eating meals?: A Little Help from another person taking care of personal grooming?: A Little Help from another person toileting, which includes using toliet, bedpan, or urinal?: A Lot Help from another person bathing (including washing, rinsing, drying)?: A Lot Help from another person to put on and taking off regular upper body clothing?: A Lot Help from another person to put on and taking off regular lower body clothing?: A Lot 6 Click Score: 14   End of Session Nurse Communication: Mobility status;Precautions  Activity Tolerance: Patient tolerated treatment well Patient left: in bed;with call bell/phone within reach (IV team arriving and reports will adjust bed and set alarm)  OT Visit Diagnosis: Unsteadiness on feet (R26.81);Muscle weakness (generalized) (M62.81)                Time: 0712-1975 OT Time Calculation (min): 12 min Charges:  OT General Charges $OT Visit: 1 Visit OT Evaluation $OT Eval Moderate Complexity: 1 Mod   Devin Harmon, OTR/L  Acute Rehabilitation Services Pager: [phone] Office: [phone] .   Jeri Modena 06/30/2020, 4:27 PM

## 2020-07-01 LAB — CULTURE, BLOOD (ROUTINE X 2)
Culture: NO GROWTH
Culture: NO GROWTH
Special Requests: ADEQUATE
Special Requests: ADEQUATE

## 2020-07-01 LAB — RENAL FUNCTION PANEL
Albumin: 2.9 g/dL — ABNORMAL LOW (ref 3.5–5.0)
Anion gap: 9 (ref 5–15)
BUN: 46 mg/dL — ABNORMAL HIGH (ref 8–23)
CO2: 28 mmol/L (ref 22–32)
Calcium: 7.9 mg/dL — ABNORMAL LOW (ref 8.9–10.3)
Chloride: 101 mmol/L (ref 98–111)
Creatinine, Ser: 7.84 mg/dL — ABNORMAL HIGH (ref 0.61–1.24)
GFR calc Af Amer: 8 mL/min — ABNORMAL LOW (ref >60)
GFR calc non Af Amer: 7 mL/min — ABNORMAL LOW (ref >60)
Glucose, Bld: 99 mg/dL (ref 70–99)
Phosphorus: 2.8 mg/dL (ref 2.5–4.6)
Potassium: 3.9 mmol/L (ref 3.5–5.1)
Sodium: 138 mmol/L (ref 135–145)

## 2020-07-01 LAB — MAGNESIUM: Magnesium: 2.2 mg/dL (ref 1.7–2.4)

## 2020-07-01 MED ORDER — CHLORHEXIDINE GLUCONATE CLOTH 2 % EX PADS
6.0000 | MEDICATED_PAD | Freq: Every day | CUTANEOUS | Status: DC
  Administered 2020-07-01 – 2020-07-04 (×4): 6 via TOPICAL

## 2020-07-01 MED ORDER — METOPROLOL SUCCINATE ER 25 MG PO TB24
12.5000 mg | ORAL_TABLET | Freq: Every day | ORAL | Status: DC
  Administered 2020-07-01 – 2020-07-04 (×3): 12.5 mg via ORAL
  Filled 2020-07-01 (×3): qty 1

## 2020-07-01 NOTE — Progress Notes (Signed)
[facility] ROUNDING NOTE   Subjective:   Brief history: 64 year old gentleman admitted 06/26/2020 with hypoxic respiratory failure secondary to volume overload and pleural effusion.  Unfortunately Monday Wednesday Friday dialysis but frequently cuts dialysis session short.  Underwent thoracentesis on emergent dialysis.  His last dialysis session was 06/29/2020 with removal of 2.5 L.  Chest tube appears to continue to drain.    Aggressive management of volume with almost 20 L removed since admission  .  Received daily dialysis 06/26/2020-06/29/2020  Blood pressure 146/69 pulse 58 temperature 98.2 O2 sats 98% on room air.  Sodium 138 potassium 3.9 chloride 101 CO2 28 BUN 46 creatinine 7.84 glucose 99 calcium 7.9 phosphorus 2.8 magnesium 2.2 albumin 2.9 hemoglobin 11.7   Amlodipine 10 mg daily, ferric citrate 420 mg 3 times daily, labetalol 100 mg twice daily, multivitamins 1 daily   Objective:  Vital signs in last 24 hours:  Temp:  [97.9 F (36.6 C)-98.6 F (37 C)] 98.2 F (36.8 C) (08/01 0359) Pulse Rate:  [62-63] 63 (08/01 0359) Resp:  [16-20] 16 (08/01 0359) BP: (128-142)/(69-75) 142/71 (08/01 0359) SpO2:  [94 %-98 %] 94 % (08/01 0359)  Weight change:  Filed Weights   06/29/20 1554 06/29/20 1954 06/30/20 0700  Weight: 71 kg 68.5 kg 68.6 kg    Intake/Output: I/O last 3 completed shifts: In: -  Out: 4290 [Other:2500; Chest Tube:1790]   Intake/Output this shift:  No intake/output data recorded.  Gen: adult chronically ill male lying in bed  CVS: Pulse regular rhythm, normal rate, S1 and S2 Resp: right sided chest tube, diffuse coarse breath sounds, bilateral chest expansion Abd: Soft, nontender/ nd Ext: Chronic lymphedema with superimposed trace pitting edema. Left upper arm AV fistula with bruit and thrill. Neuro: opens eyes to sternal rub and abd pressure.  Nonverbal at baseline   Basic Metabolic Panel: Recent Labs  Lab 06/28/20 0543 06/28/20 0909  06/29/20 0456 06/29/20 0456 06/29/20 2225 06/29/20 2225 06/30/20 0215 06/30/20 1503 07/01/20 0302  NA 138   < > 139  --  140  --  141 141 138  K 4.0   < > 3.5  --  3.6  --  3.6 3.8 3.9  CL 98   < > 100  --  101  --  103 104 101  CO2 27   < > 28  --  30  --  31 25 28   GLUCOSE 103*   < > 105*  --  136*  --  116* 142* 99  BUN 30*   < > 23  --  25*  --  28* 37* 46*  CREATININE 6.09*   < > 5.40*  --  5.21*  --  5.51* 6.72* 7.84*  CALCIUM 8.0*   < > 8.1*   < > 8.1*   < > 8.0* 8.0* 7.9*  MG 2.2  --  2.1  --   --   --  2.1  --  2.2  PHOS 6.1*   < > 3.7  --  2.6  --  3.4 2.8 2.8   < > = values in this interval not displayed.    Liver Function Tests: Recent Labs  Lab 06/26/20 0858 06/27/20 1128 06/28/20 0543 06/29/20 0456 06/29/20 2225 06/30/20 0215 06/30/20 1503 07/01/20 0302  AST 25  --   --   --   --   --   --   --   ALT 19  --   --   --   --   --   --   --  ALKPHOS 69  --   --   --   --   --   --   --   BILITOT 1.2  --   --   --   --   --   --   --   PROT 7.4 6.6  --   --   --   --   --   --   ALBUMIN 4.0  --    < > 3.1* 2.9* 2.9* 2.9* 2.9*   < > = values in this interval not displayed.   No results for input(s): LIPASE, AMYLASE in the last 168 hours. No results for input(s): AMMONIA in the last 168 hours.  CBC: Recent Labs  Lab 06/26/20 0858 06/26/20 1907 06/26/20 2105 06/28/20 0543 06/29/20 0456  WBC 8.7 10.9*  --  8.0 8.6  HGB 11.1* 11.4* 12.2* 11.0* 11.7*  HCT 36.1* 35.4* 36.0* 36.1* 37.4*  MCV 91.9 91.2  --  92.8 92.8  PLT 241 194  --  195 188    Cardiac Enzymes: No results for input(s): CKTOTAL, CKMB, CKMBINDEX, TROPONINI in the last 168 hours.  BNP: Invalid input(s): POCBNP  CBG: Recent Labs  Lab 06/29/20 2031  GLUCAP 8    Microbiology: Results for orders placed or performed during the hospital encounter of 06/26/20  Blood culture (routine x 2)     Status: None (Preliminary result)   Collection Time: 06/26/20  9:43 AM   Specimen: BLOOD  RIGHT FOREARM  Result Value Ref Range Status   Specimen Description BLOOD RIGHT FOREARM  Final   Special Requests   Final    BOTTLES DRAWN AEROBIC AND ANAEROBIC Blood Culture adequate volume   Culture   Final    NO GROWTH 4 DAYS Performed at [facility], [place] [address] [place], [place] [address]    Report Status PENDING  Incomplete  Blood culture (routine x 2)     Status: None (Preliminary result)   Collection Time: 06/26/20  9:48 AM   Specimen: BLOOD  Result Value Ref Range Status   Specimen Description BLOOD RIGHT ANTECUBITAL  Final   Special Requests   Final    BOTTLES DRAWN AEROBIC AND ANAEROBIC Blood Culture adequate volume   Culture   Final    NO GROWTH 4 DAYS Performed at [facility], [place] [address], [place], [place] [address]    Report Status PENDING  Incomplete  SARS Coronavirus 2 by RT PCR     Status: None   Collection Time: 06/26/20  9:53 AM  Result Value Ref Range Status   SARS Coronavirus 2 NEGATIVE NEGATIVE Final    Comment: (NOTE) Result indicates the ABSENCE of SARS-CoV-2 RNA in the patient specimen.   The lowest concentration of SARS-CoV-2 viral copies this assay can detect in nasopharyngeal swab specimens is 500 copies / mL.  A negative result does not preclude SARS-CoV-2 infection and should not be used as the sole basis for patient management decisions. A negative result may occur with improper specimen collection / handling, submission of a specimen other than nasopharyngeal swab, presence of viral mutation(s) within the areas targeted by this assay, and inadequate number of viral copies (<500 copies / mL) present.  Negative results must be combined with clinical observations, patient history, and epidemiological information.   The expected result is NEGATIVE.   Patient Fact Sheet:  BlogSelections.co.uk    Provider Fact Sheet:  https://lucas.com/    This test is not yet approved or cleared  by the [place] and  has been [place] orized for [employer] and/or diagnosis of SARS-CoV-2 by FDA under an Print production planner (EUA).  This EUA will remain in effect (meaning this test can be used) for the duration of  the COVID-19 declaration under Section 564(b)(1) of the Act, 21 U.S.C. section 360bbb-3(b)(1), unless the authorization is terminated or revoked sooner  Performed at [facility], [place] [address] [place], [place] [address]   Body fluid culture     Status: None   Collection Time: 06/26/20 12:31 PM   Specimen: Pleura; Body Fluid  Result Value Ref Range Status   Specimen Description PLEURAL FLUID RIGHT LUNG  Final   Special Requests NONE  Final   Gram Stain   Final    RARE WBC PRESENT, PREDOMINANTLY MONONUCLEAR NO ORGANISMS SEEN    Culture   Final    NO GROWTH 3 DAYS Performed at [facility], [address] [place], [place] [address]    Report Status 06/29/2020 FINAL  Final    Coagulation Studies: No results for input(s): LABPROT, INR in the last 72 hours.  Urinalysis: No results for input(s): COLORURINE, LABSPEC, PHURINE, GLUCOSEU, HGBUR, BILIRUBINUR, KETONESUR, PROTEINUR, UROBILINOGEN, NITRITE, LEUKOCYTESUR in the last 72 hours.  Invalid input(s): APPERANCEUR    Imaging: No results found.   Medications:    . amLODipine  10 mg Oral Daily  . Chlorhexidine Gluconate Cloth  6 each Topical Q0600  . ferric citrate  420 mg Oral TID WC  . heparin  5,000 Units Subcutaneous Q8H  . labetalol  100 mg Oral BID  . multivitamin  1 tablet Oral Daily  . sodium chloride flush  10 mL Intracatheter Q8H  . sodium chloride flush  3 mL Intravenous Once   acetaminophen, albuterol, fluticasone, hydrALAZINE, loratadine, LORazepam, ondansetron **OR** ondansetron (ZOFRAN) IV, polyethylene glycol  Assessment/ Plan:  1. Acute hypoxic respiratory failure: Secondary to combination of pulmonary edema and large right pleural effusion stemming from his  chronic nonadherence with prescribed hemodialysis and unrestricted interdialytic weight gain .   Aggressive ultrafiltration with 20 L removed since admission Status post chest tube placement on 7/29 continues to drain.  Transudative by criteria.  2. ESRD: On outpatient hemodialysis on a Monday/Wednesday/Friday schedule with pattern of chronic nonadherence with hemodialysis/truncated treatments and leaving over EDW.  S/p daily HD to optimize and now back on MWF schedule   3. Hypertension: improved with UF.  Continue current regimen.  Aggressive ultrafiltration next dialysis treatment 07/02/2020   4. Anemia 2/2 ESRD: Hemoglobin and hematocrit currently at goal, off ESA at this time.  5. CKD-MBD:  hyperphos improved.  continue Auryxia for phosphorus binding with meals. Not on vitamin D receptor analog. Phos restriction in diet  6. Nutrition: Nutrition limited by current respiratory status, monitor for aspiration. Noted heart diet is currently ordered - will make NPO for now given mental status.  RN is getting in touch with team re: plan for nutrition      LOS: [place] @TODAY @8 :41 AM

## 2020-07-01 NOTE — Progress Notes (Signed)
Pt refused bipap. Spoke with nurse she will call me if he wants bipap.

## 2020-07-01 NOTE — Progress Notes (Signed)
Pt. @ 7096 had 7 run of VTAC and @ 2836 pt had 6 run of VTAC. Doctor notified and new order placed metoprolol succinate 12.5mg   Given @ 1747 BP 147/83 (103) HR 60 Spo2 98RA will continue to monitor.   Phoebe Sharps, RN

## 2020-07-02 ENCOUNTER — Inpatient Hospital Stay (HOSPITAL_COMMUNITY): Payer: 59

## 2020-07-02 LAB — CBC WITH DIFFERENTIAL/PLATELET
Abs Immature Granulocytes: 0.08 10*3/uL — ABNORMAL HIGH (ref 0.00–0.07)
Basophils Absolute: 0.1 10*3/uL (ref 0.0–0.1)
Basophils Relative: 1 %
Eosinophils Absolute: 0.4 10*3/uL (ref 0.0–0.5)
Eosinophils Relative: 5 %
HCT: 39.6 % (ref 39.0–52.0)
Hemoglobin: 12.6 g/dL — ABNORMAL LOW (ref 13.0–17.0)
Immature Granulocytes: 1 %
Lymphocytes Relative: 10 %
Lymphs Abs: 0.9 10*3/uL (ref 0.7–4.0)
MCH: 29 pg (ref 26.0–34.0)
MCHC: 31.8 g/dL (ref 30.0–36.0)
MCV: 91.2 fL (ref 80.0–100.0)
Monocytes Absolute: 0.7 10*3/uL (ref 0.1–1.0)
Monocytes Relative: 8 %
Neutro Abs: 6.6 10*3/uL (ref 1.7–7.7)
Neutrophils Relative %: 75 %
Platelets: 166 10*3/uL (ref 150–400)
RBC: 4.34 MIL/uL (ref 4.22–5.81)
RDW: 18.6 % — ABNORMAL HIGH (ref 11.5–15.5)
WBC: 8.8 10*3/uL (ref 4.0–10.5)
nRBC: 0 % (ref 0.0–0.2)

## 2020-07-02 LAB — RENAL FUNCTION PANEL
Albumin: 3 g/dL — ABNORMAL LOW (ref 3.5–5.0)
Anion gap: 12 (ref 5–15)
BUN: 66 mg/dL — ABNORMAL HIGH (ref 8–23)
CO2: 26 mmol/L (ref 22–32)
Calcium: 8 mg/dL — ABNORMAL LOW (ref 8.9–10.3)
Chloride: 98 mmol/L (ref 98–111)
Creatinine, Ser: 9.45 mg/dL — ABNORMAL HIGH (ref 0.61–1.24)
GFR calc Af Amer: 6 mL/min — ABNORMAL LOW (ref >60)
GFR calc non Af Amer: 5 mL/min — ABNORMAL LOW (ref >60)
Glucose, Bld: 88 mg/dL (ref 70–99)
Phosphorus: 3.1 mg/dL (ref 2.5–4.6)
Potassium: 4.4 mmol/L (ref 3.5–5.1)
Sodium: 136 mmol/L (ref 135–145)

## 2020-07-02 LAB — MAGNESIUM: Magnesium: 2.3 mg/dL (ref 1.7–2.4)

## 2020-07-02 MED ORDER — LORAZEPAM 2 MG/ML IJ SOLN
INTRAMUSCULAR | Status: AC
  Filled 2020-07-02: qty 1

## 2020-07-02 NOTE — Progress Notes (Signed)
Physical Therapy Treatment Patient Details Name: JUSITN SALSGIVER MRN: [id number] DOB: 1956/03/02 Today's Date: 07/02/2020    History of Present Illness Pt adm with acute respiratory failure due to large rt pleural effusion and pulmonary edema due to hpervolemic state due to missed HD. Pt underwent thoracentensis on 7/27 and chest tube placed 7/29. Pt also on bipap at times. PMH - ESRD on HD, HTN    PT Comments    Pt lethargic but able to arouse once moved to EOB. Pt continues to require significant assistance getting to EOB and standing bedside. Wife plans to take patient home. If pt doesn't improve with arousal and ability to assist with mobility it will be very difficult to care for and to get pt back and forth to HD.    Follow Up Recommendations  Home health PT;Supervision/Assistance - 24 hour (Wife refusing SNF)     Equipment Recommendations  Rolling walker with 5" wheels;Wheelchair (measurements PT);3in1 (PT)    Recommendations for Other Services       Precautions / Restrictions Precautions Precautions: Fall;Other (comment) Precaution Comments: chest tube    Mobility  Bed Mobility Overal bed mobility: Needs Assistance Bed Mobility: Supine to Sit;Sit to Supine     Supine to sit: Max assist;+2 for safety/equipment;HOB elevated Sit to supine: +2 for physical assistance;Mod assist   General bed mobility comments: Assist to bring legs off of bed, elevate trunk into sitting, and bring hips to EOB. Assist to lower trunk and bring legs back up into bed.   Transfers Overall transfer level: Needs assistance Equipment used: Rolling walker (2 wheeled) Transfers: Sit to/from Stand Sit to Stand: +2 physical assistance;Mod assist         General transfer comment: Assist to bring hips up and for balance. Pt leaning rt and posteriorly  Ambulation/Gait             General Gait Details: Attempted side stepping next to bed but pt unable to pick up feet to move    Stairs              Wheelchair Mobility    Modified Rankin (Stroke Patients Only)       Balance Overall balance assessment: Needs assistance Sitting-balance support: No upper extremity supported;Feet supported Sitting balance-Leahy Scale: Fair     Standing balance support: Bilateral upper extremity supported Standing balance-Leahy Scale: Poor Standing balance comment: walker and mod assist for static standing. Pt with heavy right and posterior lean. Stood x 3 for 2 minutes, 1 minute, 30 sec                            Cognition Arousal/Alertness: Lethargic Behavior During Therapy: Flat affect Overall Cognitive Status: Difficult to assess Area of Impairment: Problem solving;Awareness;Safety/judgement;Following commands;Memory;Attention;Orientation                       Following Commands: Follows one step commands inconsistently;Follows one step commands with increased time     Problem Solving: Slow processing;Decreased initiation;Difficulty sequencing;Requires verbal cues;Requires tactile cues        Exercises      General Comments General comments (skin integrity, edema, etc.): VSS      Pertinent Vitals/Pain Pain Assessment: Faces Faces Pain Scale: No hurt    Home Living                      Prior Function  PT Goals (current goals can now be found in the care plan section) Acute Rehab PT Goals Patient Stated Goal: not stated Progress towards PT goals: Not progressing toward goals - comment    Frequency    Min 3X/week      PT Plan Current plan remains appropriate    Co-evaluation              AM-PAC PT "6 Clicks" Mobility   Outcome Measure  Help needed turning from your back to your side while in a flat bed without using bedrails?: Total Help needed moving from lying on your back to sitting on the side of a flat bed without using bedrails?: Total Help needed moving to and from a bed to a chair (including a  wheelchair)?: Total Help needed standing up from a chair using your arms (e.g., wheelchair or bedside chair)?: Total Help needed to walk in hospital room?: Total Help needed climbing 3-5 steps with a railing? : Total 6 Click Score: 6    End of Session   Activity Tolerance: Patient limited by lethargy Patient left: in bed;with call bell/phone within reach;with family/visitor present Nurse Communication: Mobility status PT Visit Diagnosis: Other abnormalities of gait and mobility (R26.89);Muscle weakness (generalized) (M62.81)     Time: 2355-7322 PT Time Calculation (min) (ACUTE ONLY): 16 min  Charges:  $Therapeutic Activity: 8-22 mins                     [place] Pager [phone] Office [place] 07/02/2020, 4:16 PM

## 2020-07-02 NOTE — Progress Notes (Signed)
07/02/2020 5:42 PM CT removed, pt tolerated well. [place]

## 2020-07-02 NOTE — Procedures (Signed)
I was present at this dialysis session. I have reviewed the session itself and made appropriate changes.   Vital signs in last 24 hours:  Temp:  [97.8 F (36.6 C)-98.2 F (36.8 C)] 98 F (36.7 C) (08/01 2009) Pulse Rate:  [55-60] 56 (08/02 0400) Resp:  [20-23] 20 (08/02 0400) BP: (118-147)/(72-83) 140/74 (08/02 0400) SpO2:  [96 %-100 %] 96 % (08/02 0400) Weight:  [68.3 kg] 68.3 kg (08/02 0400) Weight change:  Filed Weights   06/29/20 1954 06/30/20 0700 07/02/20 0400  Weight: 68.5 kg 68.6 kg 68.3 kg    Recent Labs  Lab 07/02/20 0326  NA 136  K 4.4  CL 98  CO2 26  GLUCOSE 88  BUN 66*  CREATININE 9.45*  CALCIUM 8.0*  PHOS 3.1    Recent Labs  Lab 06/28/20 0543 06/29/20 0456 07/02/20 0326  WBC 8.0 8.6 8.8  NEUTROABS  --   --  6.6  HGB 11.0* 11.7* 12.6*  HCT 36.1* 37.4* 39.6  MCV 92.8 92.8 91.2  PLT 195 188 166    Scheduled Meds: . amLODipine  10 mg Oral Daily  . Chlorhexidine Gluconate Cloth  6 each Topical Q0600  . ferric citrate  420 mg Oral TID WC  . heparin  5,000 Units Subcutaneous Q8H  . labetalol  100 mg Oral BID  . metoprolol succinate  12.5 mg Oral Daily  . multivitamin  1 tablet Oral Daily  . sodium chloride flush  10 mL Intracatheter Q8H  . sodium chloride flush  3 mL Intravenous Once   Continuous Infusions: PRN Meds:.acetaminophen, albuterol, fluticasone, hydrALAZINE, loratadine, LORazepam, ondansetron **OR** ondansetron (ZOFRAN) IV, polyethylene glycol     Dialysis Orders: MWF- [facility] 4.5 hrs, BFR400, MEB583, EDW 91.5kg (now is 68 kg),2K/2.5Ca  Access:TDC. LU AVF maturing - ready for use starting 06/16/20 Heparin3000 Mircera119mcg q2wks - last 06/20/20 Venofer 100mg  qwk Calcitriol1.16mcg PO qHD Auryxia 2 tab AC TID  Assessment and Plan: 1. Acute hypoxic respiratory failure- due to noncompliance with HD.  Has had aggressive HD with UF as well as thoracentesis.  S/p chest tube on 06/28/20.  Net negative 23 kg. 2. ESRE- continue  with HD on MWF schedule.  Significant reduction in EDW. 3. HTN- improved with UF 4. Anemia due to ESRD- no ESA with Hgb >12 5. Pleural effusion- transudative, s/p chest tube 06/28/20 6. Vascular access- LUE AVF +T/B and can start to use this week 7. CKD-MBD- cont with binders Donetta Potts,  MD 07/02/2020, 8:14 AM

## 2020-07-02 NOTE — Progress Notes (Signed)
NAME:  Devin Harmon, MRN:  [id number], DOB:  [birth year], LOS: 6 ADMISSION DATE:  06/26/2020, CONSULTATION DATE:  06/26/20 REFERRING MD:  Ronnald Nian, CHIEF COMPLAINT:  Shortness of breath  Brief History   64yom presenting with acute respiratory failure 2/2 a large right pleural effusion and pulm edema that is attributable to hypervolemic state in the setting of missed HD.  Underwent large thoracentesis 7/27 with 2,353ml removed with almost completed reacumulation same day. Has received 3 HD treatments since admission and continues to suffer from recurrent pleural effusion.   History of present illness   61 yom with ESRD on HD who presented to [facility] on 06/26/20 for 2d history of progressive shortness of breath. Wife is primary historian due to pt being on BiPAP.    She notes that he missed HD on Friday due to diarrhea. Since that time, he developed progressive shortness of breath and loss of appetite. He was able to make it to HD on day prior to admission, however it is unclear how much fluid was removed. Over the same time period, he additionally developed orthopnea which he does not have at baseline.  Also of note, his wife explains that he recently changed HD centers and has developed worsening LE edema since then. Denies recent fevers, chills or respiratory symptoms other than the shortness of breath.   Past Medical History  ESRD on HD, hypertension  Significant Hospital Events   7/27 admission; thoracentesis 7/30 chest tube placement 8/2 new R sided PTX on CXR.   Consults:  PCCM Nephrology  Procedures:  Right thoracentesis 7/27>>2332mL clear yellow fluid Pigtail chest tube inserted 7/29 with 2L fluid removed   Significant Diagnostic Tests:  7/27 CXR>> pulm edema with large right pleural effusion. Cardiomegaly. Bibasilar atelectasis 8/2 CXR with new R sided PTX and more lateral positioning of pigtail  Micro Data:  COVID > neg Pleural fluid cultures > Pleural gram stain > Rare  WBC, predominately Mononuclear   Antimicrobials:  n/a  Interim history/subjective:  NAEO. Patient's wife asking when they will be able to go home and worries about the patient's restlessness the longer he remains in the hospital HD with 3.5L off  This morning, CXR with new PTX on R side and apparent change in position of pigtail chest tube Chest tube evaluated at bedside which revealed that tube has been pulled out several cm.  Continues to be functional, placed to -20cm suction. Tape applied and insertion site re-dressed by PCCM. Subsequent improvement in air leak.  Objective   Blood pressure 128/68, pulse 63, temperature 97.9 F (36.6 C), temperature source Oral, resp. rate (!) 22, height 5\' 9"  (1.753 m), weight 67.7 kg, SpO2 95 %.        Intake/Output Summary (Last 24 hours) at 07/02/2020 1300 Last data filed at 07/02/2020 1125 Gross per 24 hour  Intake 370 ml  Output 4610 ml  Net -4240 ml   Filed Weights   07/02/20 0400 07/02/20 0710 07/02/20 1144  Weight: 68.3 kg 70.5 kg 67.7 kg    Examination: General: Chronically ill appearing older adult M supine in bed NAD  HEENT: NCAT pink mmm trachea midline anicteric sclera  Neuro: Awake alert. Following some commands. Paucity of speech, somewhat garbled  CV: RRR s1s2 no rgm cap refill < 3 seconds  PULM: CTA bilaterally. Symmetrical chest expansion. Improved air leak on R chest tube. CTA bilaterally GI: Soft round ndnt + bowel sounds  Extremities: No edema. Symmetrical muscle bulk.  Skin: c/d/w without rash  Resolved Hospital Problem list   Acute hypoxic respiratory failure  Pulmonary Edema  Assessment & Plan:   Bilateral pleural effusion, s/p thoracentesis and s/p chest tube placement -high output transudative effusion in setting of acute decompensated systolic heart failure + ESRD with volume overload -Some concern for SVC syndrome given dilated veins in upper chest -8/2 with significant decrease in output  Right sided  pneumothorax -seen on CXR 8/2. Suspect this is related to interval change in pigtail positioning, causing leak due to exposed side hole  -pigtail remains functional, taped and redressed 8/2 Plan Chest tube to -20 Routine chest tube care Follow up CXR later this afternoon 8/2 to evaluate for interval change to PTX  If significant residual PTX, likely need to replace pigtail.  Big picture, continued optimization of fluid status is likely best management for pleural effusion   PCCM will continue to follow for chest tube management   Best practice:  Diet: Heart healthy  Pain/Anxiety/Delirium protocol (if indicated): PRN VAP protocol (if indicated): N/A DVT prophylaxis: Subq heparin  GI prophylaxis: PPI Glucose control: Monitor  Mobility: Bedrest  Code Status: Full  Family Communication: Wife updated at bedside 8/2  Disposition: Floor   Labs   CBC: Recent Labs  Lab 06/26/20 0858 06/26/20 0858 06/26/20 1907 06/26/20 2105 06/28/20 0543 06/29/20 0456 07/02/20 0326  WBC 8.7  --  10.9*  --  8.0 8.6 8.8  NEUTROABS  --   --   --   --   --   --  6.6  HGB 11.1*   < > 11.4* 12.2* 11.0* 11.7* 12.6*  HCT 36.1*   < > 35.4* 36.0* 36.1* 37.4* 39.6  MCV 91.9  --  91.2  --  92.8 92.8 91.2  PLT 241  --  194  --  195 188 166   < > = values in this interval not displayed.    Basic Metabolic Panel: Recent Labs  Lab 06/28/20 0543 06/28/20 1610 06/29/20 0456 06/29/20 0456 06/29/20 2225 06/30/20 0215 06/30/20 1503 07/01/20 0302 07/02/20 0326  NA 138   < > 139   < > 140 141 141 138 136  K 4.0   < > 3.5   < > 3.6 3.6 3.8 3.9 4.4  CL 98   < > 100   < > 101 103 104 101 98  CO2 27   < > 28   < > 30 31 25 28 26   GLUCOSE 103*   < > 105*   < > 136* 116* 142* 99 88  BUN 30*   < > 23   < > 25* 28* 37* 46* 66*  CREATININE 6.09*   < > 5.40*   < > 5.21* 5.51* 6.72* 7.84* 9.45*  CALCIUM 8.0*   < > 8.1*   < > 8.1* 8.0* 8.0* 7.9* 8.0*  MG 2.2  --  2.1  --   --  2.1  --  2.2 2.3  PHOS 6.1*   < >  3.7   < > 2.6 3.4 2.8 2.8 3.1   < > = values in this interval not displayed.   GFR: Estimated Creatinine Clearance: 7.5 mL/min (A) (by C-G formula based on SCr of 9.45 mg/dL (H)). Recent Labs  Lab 06/26/20 0858 06/26/20 0943 06/26/20 1907 06/28/20 0543 06/29/20 0456 07/02/20 0326  WBC   < >  --  10.9* 8.0 8.6 8.8  LATICACIDVEN  --  1.5 1.3  --   --   --    < > =  values in this interval not displayed.    Liver Function Tests: Recent Labs  Lab 06/26/20 0858 06/27/20 1128 06/28/20 0543 06/29/20 2225 06/30/20 0215 06/30/20 1503 07/01/20 0302 07/02/20 0326  AST 25  --   --   --   --   --   --   --   ALT 19  --   --   --   --   --   --   --   ALKPHOS 69  --   --   --   --   --   --   --   BILITOT 1.2  --   --   --   --   --   --   --   PROT 7.4 6.6  --   --   --   --   --   --   ALBUMIN 4.0  --    < > 2.9* 2.9* 2.9* 2.9* 3.0*   < > = values in this interval not displayed.   No results for input(s): LIPASE, AMYLASE in the last 168 hours. No results for input(s): AMMONIA in the last 168 hours.  ABG    Component Value Date/Time   PHART 7.435 06/26/2020 2105   PCO2ART 36.6 06/26/2020 2105   PO2ART 58 (L) 06/26/2020 2105   HCO3 24.5 06/26/2020 2105   TCO2 26 06/26/2020 2105   ACIDBASEDEF 8.4 (H) 04/12/2019 1801   O2SAT 90.0 06/26/2020 2105     Coagulation Profile: No results for input(s): INR, PROTIME in the last 168 hours.  Cardiac Enzymes: No results for input(s): CKTOTAL, CKMB, CKMBINDEX, TROPONINI in the last 168 hours.  HbA1C: No results found for: HGBA1C  CBG: Recent Labs  Lab 06/29/20 2031  GLUCAP 93      Signature:     Eliseo Gum MSN, AGACNP-BC [place] [phone] If no answer, [phone] 07/02/2020, 1:00 PM

## 2020-07-02 NOTE — Progress Notes (Signed)
PT Cancellation Note  Patient Details Name: Devin Harmon MRN: [id number] DOB: [birth year]   Cancelled Treatment:    Reason Eval/Treat Not Completed: Patient at procedure or test/unavailable. Pt at HD.   Shary Decamp [facility] 07/02/2020, 8:17 AM [place] Pager [phone] Office [phone]

## 2020-07-02 NOTE — Progress Notes (Signed)
PCCM Brief Progress Note  CXR obtained 1530 following pigtail placement to -20cm suction, tape/redressing of chest tube to minimize mechanical air leak from pigtail.   CXR reviewed with Dr. Lamonte Sakai, showing significant improvement in R sided PTX, total or near total resolution of PTX.   As such, will place order for chest tube removal as per previously discussed plan.  Follow up CXR 8/3 AM to evaluate.     Eliseo Gum MSN, AGACNP-BC [place] [phone] If no answer, [phone] 07/02/2020, 3:33 PM

## 2020-07-02 NOTE — Progress Notes (Signed)
PROGRESS NOTE    Devin Harmon  WNI:[id number] DOB: 09-Jul-1956 DOA: 06/26/2020 PCP: Venia Carbon, MD  Brief Narrative:  64 year old black male Previous herpes zoster 2007 ESRD since 2019 however started dialysis 04/17/2019 admission TTS through right sided PermCath Poorly controlled hypertension Anemia of renal disease Presented to the emergency room 06/26/2020 severely short of breath patient was on BiPAP at the time CCM consulted secondary to large right-sided pleural effusion pulmonary edema in the setting of missed dialysis Right thoracentesis revealed 2.3 L clear yellow fluid-by lites creatinine seems translated BiPAP has been weaned to 3 L of oxygen  Assessment & Plan:   Active Problems:   Essential hypertension, benign   BPH without obstruction/lower urinary tract symptoms   End stage renal disease (HCC)   Pleural effusion   Acute respiratory failure ([place])   1. Hypoxic respiratory failure secondary to large right-sided transudative pleural effusion a. 2/2 under dialysis/decompensated systolic failure b. Much better without oxygen requirement currently c. CXR 8/1 showed ~30% Pneumothorax-With reposition/suction has improved d. ?d/c chest tube am 8/3 e. Follow CXR for 8/3 f. Pulm CCM managing 2. New systolic heart failure a. Defer as an outpatient to cardiology working this up b. Prior thought with this was secondary to large effusion causing some wall mobility issues c. Needs an echo in 3 to 6 months d. Weight 83 kg-->67Kg 3. ESRD TTS (chronically nonadherent) has left upper arm AV fistula a. Appreciate nephrology input has been daily dialyzing 7/27 through 7/30 b. Defer to them planning 4. Anemia renal disease a. Periodic labs b. Stable at this time 5. Poorly controlled hypertension a. Continue amlodipine 10, labetalol 100 twice daily, amlodipine 10 b. Control is moderate c. May benefit from addition of ACE?  Monitor 6. Prior right-sided hemiparesis from CVA  and possible post infarct sequelae with post infarct related cognitive slowing a. Apparently he is more cognizant at home b. Wife declined skilled option and says she will manage him at home  DVT prophylaxis: Heparin Code Status: Full Family Communication:  Disposition: OT evaluation and PT evaluation recommending skilled-await Pulm Input Wife wishes to take patient home with max home health orders in  Status is: Inpatient  Remains inpatient appropriate because:Unsafe d/c plan, IV treatments appropriate due to intensity of illness or inability to take PO and Inpatient level of care appropriate due to severity of illness Patient is somewhat incoherent and still has a chest tube in addition still has high drainage from this he is not stable for discharge at this time-based on my discussion with wife at bedside 8/1 she wants to take him home and is requesting that we figure out a way to get the fluid in the lungs better I have explained to her that this is a process and that we need to see if there is a need for further intervention such as talc pleurodesis and explained this to her name and terms with the nurse present-she seems to understand by this quite concerned about how long he may need to stay here as the patient continuously requests to go home  Dispo: The patient is from: Home              Anticipated d/c is to: Home              Anticipated d/c date is: > 3 days              Patient currently is not medically stable to d/c.  Consultants:   Nephrology  Critical care medicine  Procedures: Chest tube placement  Antimicrobials: None   Subjective:  events re: chest tube noted appreciate CCM input vss and no distress  "I wanna go home"  Objective: Vitals:   07/02/20 1030 07/02/20 1100 07/02/20 1144 07/02/20 1225  BP: (!) 149/74 139/75 (!) 146/79 128/68  Pulse: 55 (!) 54 (!) 55 63  Resp:   (!) 30 (!) 22  Temp:   98.4 F (36.9 C) 97.9 F (36.6 C)  TempSrc:   Oral Oral   SpO2:   99% 95%  Weight:   67.7 kg   Height:        Intake/Output Summary (Last 24 hours) at 07/02/2020 1552 Last data filed at 07/02/2020 1300 Gross per 24 hour  Intake 490 ml  Output 4410 ml  Net -3920 ml   Filed Weights   07/02/20 0400 07/02/20 0710 07/02/20 1144  Weight: 68.3 kg 70.5 kg 67.7 kg    Examination:  General exam: Awake alert coherent to person-cannot obtain further ROS Respiratory system: Decreased air entry right lung field, Decreased BS when I examined Cardiovascular system: S1-S2 no murmur sinus/ sinus tach Gastrointestinal system: Soft nontender. Central nervous system: Hemiparetic otherwise within normal limits but some  slurred speech Extremities: No lower extremity edema Skin: As above Psychiatry: Euthymic  Data Reviewed: I have personally reviewed following labs and imaging studies  CBC    Component Value Date/Time   WBC 8.8 07/02/2020 0326   RBC 4.34 07/02/2020 0326   HGB 12.6 (L) 07/02/2020 0326   HCT 39.6 07/02/2020 0326   PLT 166 07/02/2020 0326   MCV 91.2 07/02/2020 0326   MCH 29.0 07/02/2020 0326   MCHC 31.8 07/02/2020 0326   RDW 18.6 (H) 07/02/2020 0326   LYMPHSABS 0.9 07/02/2020 0326   MONOABS 0.7 07/02/2020 0326   EOSABS 0.4 07/02/2020 0326   BASOSABS 0.1 07/02/2020 0326   BMET    Component Value Date/Time   NA 136 07/02/2020 0326   K 4.4 07/02/2020 0326   CL 98 07/02/2020 0326   CO2 26 07/02/2020 0326   GLUCOSE 88 07/02/2020 0326   BUN 66 (H) 07/02/2020 0326   CREATININE 9.45 (H) 07/02/2020 0326   CREATININE 2.04 (H) 06/20/2011 1601   CALCIUM 8.0 (L) 07/02/2020 0326   GFRNONAA 5 (L) 07/02/2020 0326   GFRAA 6 (L) 07/02/2020 0326     Radiology Studies: DG CHEST PORT 1 VIEW  Result Date: 07/02/2020 CLINICAL DATA:  Status post right chest tube placement for pneumothorax. EXAM: PORTABLE CHEST 1 VIEW COMPARISON:  Single-view of the chest earlier today. FINDINGS: Pigtail catheter is seen in the inferior aspect of the right  chest. Tiny residual pneumothorax of less than 5% is seen. There is subcutaneous emphysema in the right chest wall. The left lung is expanded and clear. Cardiomegaly again seen. Atherosclerosis is also again noted. Dialysis catheter is unchanged. IMPRESSION: Tiny residual right pneumothorax after chest tube placement. No new abnormality. Electronically Signed   By: Inge Rise M.D.   On: 07/02/2020 15:35   DG CHEST PORT 1 VIEW  Result Date: 07/02/2020 CLINICAL DATA:  Pleural effusion and chest tube. EXAM: PORTABLE CHEST 1 VIEW COMPARISON:  06/28/2020 FINDINGS: Left central venous catheter is unchanged in position. A pigtail chest tube remains in the right base. Positioning is more lateral than on the previous study. The right pneumothorax has increased. This may indicate nonfunctioning tube. Subcutaneous emphysema on the right is increased. Atelectasis in the right base. Cardiac enlargement. IMPRESSION: Increasing  size of right pneumothorax with change in position of chest tube. This may indicate nonfunctioning tube. Increasing subcutaneous emphysema. Electronically Signed   By: Lucienne Capers M.D.   On: 07/02/2020 06:40     Scheduled Meds: . amLODipine  10 mg Oral Daily  . Chlorhexidine Gluconate Cloth  6 each Topical Q0600  . ferric citrate  420 mg Oral TID WC  . heparin  5,000 Units Subcutaneous Q8H  . LORazepam      . metoprolol succinate  12.5 mg Oral Daily  . multivitamin  1 tablet Oral Daily  . sodium chloride flush  10 mL Intracatheter Q8H  . sodium chloride flush  3 mL Intravenous Once   Continuous Infusions:   LOS: 6 days    Time spent: 40  Nita Sells, MD [facility] To contact the attending provider between 7A-7P or the covering provider during after hours 7P-7A, please log into the web site [email or web address] and access using universal [place] password for that web site. If you do not have the password, please call the hospital operator.  07/02/2020, 3:52  PM

## 2020-07-03 ENCOUNTER — Inpatient Hospital Stay (HOSPITAL_COMMUNITY): Payer: 59

## 2020-07-03 DIAGNOSIS — J939 Pneumothorax, unspecified: Secondary | ICD-10-CM

## 2020-07-03 DIAGNOSIS — J9383 Other pneumothorax: Secondary | ICD-10-CM

## 2020-07-03 LAB — RENAL FUNCTION PANEL
Albumin: 3 g/dL — ABNORMAL LOW (ref 3.5–5.0)
Anion gap: 14 (ref 5–15)
BUN: 36 mg/dL — ABNORMAL HIGH (ref 8–23)
CO2: 26 mmol/L (ref 22–32)
Calcium: 7.8 mg/dL — ABNORMAL LOW (ref 8.9–10.3)
Chloride: 95 mmol/L — ABNORMAL LOW (ref 98–111)
Creatinine, Ser: 6.6 mg/dL — ABNORMAL HIGH (ref 0.61–1.24)
GFR calc Af Amer: 9 mL/min — ABNORMAL LOW (ref >60)
GFR calc non Af Amer: 8 mL/min — ABNORMAL LOW (ref >60)
Glucose, Bld: 83 mg/dL (ref 70–99)
Phosphorus: 3.7 mg/dL (ref 2.5–4.6)
Potassium: 4.1 mmol/L (ref 3.5–5.1)
Sodium: 135 mmol/L (ref 135–145)

## 2020-07-03 LAB — MAGNESIUM: Magnesium: 2.1 mg/dL (ref 1.7–2.4)

## 2020-07-03 MED ORDER — METOPROLOL SUCCINATE ER 25 MG PO TB24: 12.5000 mg | ORAL_TABLET | Freq: Every day | ORAL | 0 refills | Status: DC

## 2020-07-03 NOTE — Progress Notes (Signed)
PROGRESS NOTE    Devin Harmon  BZJ:[id number] DOB: 10-14-1956 DOA: 06/26/2020 PCP: Venia Carbon, MD  Brief Narrative:  64 year old black male Previous herpes zoster 2007 ESRD since 2019 however started dialysis 04/17/2019 admission TTS through right sided PermCath Poorly controlled hypertension Anemia of renal disease Presented to the emergency room 06/26/2020 severely short of breath patient was on BiPAP at the time CCM consulted secondary to large right-sided pleural effusion pulmonary edema in the setting of missed dialysis Right thoracentesis revealed 2.3 L clear yellow fluid-by lites creatinine seems translated BiPAP has been weaned to 3 L of oxygen  Assessment & Plan:   Active Problems:   Essential hypertension, benign   BPH without obstruction/lower urinary tract symptoms   End stage renal disease (HCC)   Pleural effusion   Acute respiratory failure ([place])   1. Hypoxic respiratory failure secondary to large right-sided transudative pleural effusion a. 2/2 under dialysis/decompensated systolic failure b. Chest tube removed 8/2 residual small pneumothorax on CXR 8/3 c. Repeat CXR 8/4 and possibly can discharge if all stable 2. New systolic heart failure EF 35 to 45% a. Defer as an outpatient to cardiology working this up-I will send a message to [place] have a TOC visit for outpatient coordination of work-up b. Prior thought with this was secondary to large effusion causing some wall mobility issues c. Needs an echo in 3 to 6 months d. Weight 83 kg--> 66.5 3. ESRD MWF (chronically nonadherent) has left upper arm AV fistula (can start using apparently this week) a. Received daily dialy 7/27 through 7/30 b. Further management per renal 4. Anemia renal disease a. Periodic labs b. Stable at this time 5. Poorly controlled hypertension a. Continue amlodipine 10, labetalol 100 twice daily, amlodipine 10 b. Control is moderate c. May benefit from addition of ACE?   Monitor 6. Prior right-sided hemiparesis from CVA and possible post infarct sequelae with post infarct related cognitive slowing a. Apparently he is more cognizant at home b. Wife declined skilled option and says she will manage him at home  DVT prophylaxis: Heparin Code Status: Full Family Communication:  Disposition: OT evaluation and PT evaluation recommending skilled-await Pulm Input Wife wishes to take patient home with max home health orders in  Status is: Inpatient  Remains inpatient appropriate because:Unsafe d/c plan, IV treatments appropriate due to intensity of illness or inability to take PO and Inpatient level of care appropriate due to severity of illness Patient is somewhat incoherent and still has a chest tube in addition still has high drainage from this he is not stable for discharge at this time-based on my discussion with wife at bedside 8/1 she wants to take him home and is requesting that we figure out a way to get the fluid in the lungs better I have explained to her that this is a process and that we need to see if there is a need for further intervention such as talc pleurodesis and explained this to her name and terms with the nurse present-she seems to understand by this quite concerned about how long he may need to stay here as the patient continuously requests to go home  Dispo: The patient is from: Home              Anticipated d/c is to: Home              Anticipated d/c date is: > 3 days  Patient currently is not medically stable to d/c.  Consultants:   Nephrology  Critical care medicine  Procedures: Chest tube placement  Antimicrobials: None   Subjective:  Sleepy this morning No distress Slight tachycardia Otherwise I discussed with Dr. Lamonte Sakai of critical care planning and he is okay with possible discharge if all remains stable on chest x-ray 8/4-patient represents a risk for reaccumulation of fluid based on his fluid balance with  dialysis I left a long voicemail message with his wife on the phone detailing these discussions  Objective: Vitals:   07/03/20 0246 07/03/20 0345 07/03/20 0824 07/03/20 1113  BP:  138/71 131/77 115/71  Pulse: 62 63 64 (!) 53  Resp: (!) 25 19 20 17   Temp:  97.8 F (36.6 C) 97.7 F (36.5 C) 97.8 F (36.6 C)  TempSrc:  Oral Oral Oral  SpO2: (!) 89% 98% 97% 98%  Weight: 66.5 kg     Height:        Intake/Output Summary (Last 24 hours) at 07/03/2020 1310 Last data filed at 07/03/2020 0830 Gross per 24 hour  Intake 120 ml  Output --  Net 120 ml   Filed Weights   07/02/20 0710 07/02/20 1144 07/03/20 0246  Weight: 70.5 kg 67.7 kg 66.5 kg    Examination:  General exam: Sleepy this morning Respiratory system: Decreased breath sounds bilaterally posterolaterally Cardiovascular system: S1-S2 no murmur sinus/ sinus tach Gastrointestinal system: Soft nontender. Central nervous system: Moving 4 limbs grossly however he was asleep when I examined him Extremities: No lower extremity edema Skin: As above Psychiatry: Euthymic  Data Reviewed: I have personally reviewed following labs and imaging studies  CBC    Component Value Date/Time   WBC 8.8 07/02/2020 0326   RBC 4.34 07/02/2020 0326   HGB 12.6 (L) 07/02/2020 0326   HCT 39.6 07/02/2020 0326   PLT 166 07/02/2020 0326   MCV 91.2 07/02/2020 0326   MCH 29.0 07/02/2020 0326   MCHC 31.8 07/02/2020 0326   RDW 18.6 (H) 07/02/2020 0326   LYMPHSABS 0.9 07/02/2020 0326   MONOABS 0.7 07/02/2020 0326   EOSABS 0.4 07/02/2020 0326   BASOSABS 0.1 07/02/2020 0326   BMET    Component Value Date/Time   NA 135 07/03/2020 0354   K 4.1 07/03/2020 0354   CL 95 (L) 07/03/2020 0354   CO2 26 07/03/2020 0354   GLUCOSE 83 07/03/2020 0354   BUN 36 (H) 07/03/2020 0354   CREATININE 6.60 (H) 07/03/2020 0354   CREATININE 2.04 (H) 06/20/2011 1601   CALCIUM 7.8 (L) 07/03/2020 0354   GFRNONAA 8 (L) 07/03/2020 0354   GFRAA 9 (L) 07/03/2020 0354      Radiology Studies: DG CHEST PORT 1 VIEW  Result Date: 07/03/2020 CLINICAL DATA:  Chest tube removal. EXAM: PORTABLE CHEST 1 VIEW COMPARISON:  07/02/2020. FINDINGS: Interim removal of right chest tube. Small to moderate pneumothorax noted. Dual-lumen catheter noted stable position. Stable cardiomegaly. Bilateral interstitial prominence noted. Interstitial edema and/or pneumonitis cannot be excluded. Small bilateral pleural effusions. Diffuse right chest wall subcutaneous emphysema. IMPRESSION: 1. Interim removal of right chest tube. Small to moderate right-sided pneumothorax noted. 2.  Dual-lumen catheter stable position. 3. Cardiomegaly with diffuse bilateral pulmonary interstitial prominence and small bilateral pleural effusions suggesting CHF. Pneumonitis cannot be excluded. Critical Value/emergent results were called by telephone at the time of interpretation on 07/03/2020 at 7:59 am to nurse Jarrett Soho , who verbally acknowledged these results. Electronically Signed   By: Marcello Moores  Register   On: 07/03/2020  08:01   DG CHEST PORT 1 VIEW  Result Date: 07/02/2020 CLINICAL DATA:  Status post right chest tube placement for pneumothorax. EXAM: PORTABLE CHEST 1 VIEW COMPARISON:  Single-view of the chest earlier today. FINDINGS: Pigtail catheter is seen in the inferior aspect of the right chest. Tiny residual pneumothorax of less than 5% is seen. There is subcutaneous emphysema in the right chest wall. The left lung is expanded and clear. Cardiomegaly again seen. Atherosclerosis is also again noted. Dialysis catheter is unchanged. IMPRESSION: Tiny residual right pneumothorax after chest tube placement. No new abnormality. Electronically Signed   By: Inge Rise M.D.   On: 07/02/2020 15:35   DG CHEST PORT 1 VIEW  Result Date: 07/02/2020 CLINICAL DATA:  Pleural effusion and chest tube. EXAM: PORTABLE CHEST 1 VIEW COMPARISON:  06/28/2020 FINDINGS: Left central venous catheter is unchanged in position. A  pigtail chest tube remains in the right base. Positioning is more lateral than on the previous study. The right pneumothorax has increased. This may indicate nonfunctioning tube. Subcutaneous emphysema on the right is increased. Atelectasis in the right base. Cardiac enlargement. IMPRESSION: Increasing size of right pneumothorax with change in position of chest tube. This may indicate nonfunctioning tube. Increasing subcutaneous emphysema. Electronically Signed   By: Lucienne Capers M.D.   On: 07/02/2020 06:40     Scheduled Meds: . amLODipine  10 mg Oral Daily  . Chlorhexidine Gluconate Cloth  6 each Topical Q0600  . ferric citrate  420 mg Oral TID WC  . heparin  5,000 Units Subcutaneous Q8H  . metoprolol succinate  12.5 mg Oral Daily  . multivitamin  1 tablet Oral Daily  . sodium chloride flush  10 mL Intracatheter Q8H  . sodium chloride flush  3 mL Intravenous Once   Continuous Infusions:   LOS: 7 days    Time spent: [place], MD [facility] To contact the attending provider between 7A-7P or the covering provider during after hours 7P-7A, please log into the web site [email or web address] and access using universal [place] password for that web site. If you do not have the password, please call the hospital operator.  07/03/2020, 1:10 PM

## 2020-07-03 NOTE — Progress Notes (Signed)
NAME:  NNAEMEKA SAMSON, MRN:  [id number], DOB:  11-15-1956, LOS: 7 ADMISSION DATE:  06/26/2020, CONSULTATION DATE:  06/26/20 REFERRING MD:  Ronnald Nian, CHIEF COMPLAINT:  Shortness of breath  Brief History   64yom presenting with acute respiratory failure 2/2 a large right pleural effusion and pulm edema that is attributable to hypervolemic state in the setting of missed HD.  Underwent large thoracentesis 7/27 with 2,353ml removed with almost completed reacumulation same day. Has received 3 HD treatments since admission and continues to suffer from recurrent pleural effusion.   History of present illness   60 yom with ESRD on HD who presented to [facility] on 06/26/20 for 2d history of progressive shortness of breath. Wife is primary historian due to pt being on BiPAP.    She notes that he missed HD on Friday due to diarrhea. Since that time, he developed progressive shortness of breath and loss of appetite. He was able to make it to HD on day prior to admission, however it is unclear how much fluid was removed. Over the same time period, he additionally developed orthopnea which he does not have at baseline.  Also of note, his wife explains that he recently changed HD centers and has developed worsening LE edema since then. Denies recent fevers, chills or respiratory symptoms other than the shortness of breath.   Past Medical History  ESRD on HD, hypertension  Significant Hospital Events   7/27 admission; thoracentesis 7/30 chest tube placement 8/2 new R sided PTX on CXR.   Consults:  PCCM Nephrology  Procedures:  Right thoracentesis 7/27>>236mL clear yellow fluid Pigtail chest tube inserted 7/29 with 2L fluid removed  >> 8/2  Significant Diagnostic Tests:  7/27 CXR>> pulm edema with large right pleural effusion. Cardiomegaly. Bibasilar atelectasis 8/2 CXR with new R sided PTX and more lateral positioning of pigtail  Micro Data:  COVID > neg Pleural fluid cultures > Pleural gram stain >  Rare WBC, predominately Mononuclear   Antimicrobials:  n/a  Interim history/subjective:   Chest tube removed on 8/2 Denies any pain or shortness of breath   Objective   Blood pressure 115/71, pulse (!) 53, temperature 97.8 F (36.6 C), temperature source Oral, resp. rate 17, height 5\' 9"  (1.753 m), weight 66.5 kg, SpO2 98 %.        Intake/Output Summary (Last 24 hours) at 07/03/2020 1531 Last data filed at 07/03/2020 0830 Gross per 24 hour  Intake 120 ml  Output --  Net 120 ml   Filed Weights   07/02/20 0710 07/02/20 1144 07/03/20 0246  Weight: 70.5 kg 67.7 kg 66.5 kg    Examination: Exam is unchanged on 8/3 General: Chronically ill appearing older adult M supine in bed NAD  HEENT: NCAT pink mmm trachea midline anicteric sclera  Neuro: Awake alert. Following some commands. Paucity of speech, somewhat garbled  CV: RRR s1s2 no rgm cap refill < 3 seconds  PULM: CTA bilaterally. Symmetrical chest expansion. Improved air leak on R chest tube. CTA bilaterally GI: Soft round ndnt + bowel sounds  Extremities: No edema. Symmetrical muscle bulk.  Skin: c/d/w without rash  Chest x-ray 07/03/20 reviewed by me, shows some residual small right-sided pneumothorax post chest tube removal, no fluid accumulation, no air-fluid level (yet)  Resolved Hospital Problem list   Acute hypoxic respiratory failure  Pulmonary Edema  Assessment & Plan:   Bilateral pleural effusion, s/p thoracentesis and s/p chest tube placement -high output transudative effusion in setting of acute decompensated systolic heart  failure + ESRD with volume overload -Some concern for SVC syndrome given dilated veins in upper chest Right sided pneumothorax -Occurred when patient's chest tube had been malpositioned with side-port outside the skin.  Improved when pigtail was taped and redressed, may more functional. Now there is residual small R ptx s/p chest tube removal. Hopefully this occurred when tube was removed. No  reason to suspect that he should have an airleak or lung injury Plan Follow CXR in am 8/4. If PTX resolving then would not do anything further. If PTX is increasing then we would need to discuss possible chest tube replacement with pt and family Big picture, continued optimization of fluid status is  best management for pleural effusion     Best practice:  Diet: Heart healthy  Pain/Anxiety/Delirium protocol (if indicated): PRN VAP protocol (if indicated): N/A DVT prophylaxis: Subq heparin  GI prophylaxis: PPI Glucose control: Monitor  Mobility: Bedrest  Code Status: Full  Family Communication: family updated at bedside 8/2  Disposition: Floor   Labs   CBC: Recent Labs  Lab 06/26/20 1907 06/26/20 2105 06/28/20 0543 06/29/20 0456 07/02/20 0326  WBC 10.9*  --  8.0 8.6 8.8  NEUTROABS  --   --   --   --  6.6  HGB 11.4* 12.2* 11.0* 11.7* 12.6*  HCT 35.4* 36.0* 36.1* 37.4* 39.6  MCV 91.2  --  92.8 92.8 91.2  PLT 194  --  195 188 841    Basic Metabolic Panel: Recent Labs  Lab 06/29/20 0456 06/29/20 2225 06/30/20 0215 06/30/20 1503 07/01/20 0302 07/02/20 0326 07/03/20 0354  NA 139   < > 141 141 138 136 135  K 3.5   < > 3.6 3.8 3.9 4.4 4.1  CL 100   < > 103 104 101 98 95*  CO2 28   < > 31 25 28 26 26   GLUCOSE 105*   < > 116* 142* 99 88 83  BUN 23   < > 28* 37* 46* 66* 36*  CREATININE 5.40*   < > 5.51* 6.72* 7.84* 9.45* 6.60*  CALCIUM 8.1*   < > 8.0* 8.0* 7.9* 8.0* 7.8*  MG 2.1  --  2.1  --  2.2 2.3 2.1  PHOS 3.7   < > 3.4 2.8 2.8 3.1 3.7   < > = values in this interval not displayed.   GFR: Estimated Creatinine Clearance: 10.5 mL/min (A) (by C-G formula based on SCr of 6.6 mg/dL (H)). Recent Labs  Lab 06/26/20 1907 06/28/20 0543 06/29/20 0456 07/02/20 0326  WBC 10.9* 8.0 8.6 8.8  LATICACIDVEN 1.3  --   --   --     Liver Function Tests: Recent Labs  Lab 06/27/20 1128 06/28/20 0543 06/30/20 0215 06/30/20 1503 07/01/20 0302 07/02/20 0326  07/03/20 0354  PROT 6.6  --   --   --   --   --   --   ALBUMIN  --    < > 2.9* 2.9* 2.9* 3.0* 3.0*   < > = values in this interval not displayed.   No results for input(s): LIPASE, AMYLASE in the last 168 hours. No results for input(s): AMMONIA in the last 168 hours.  ABG    Component Value Date/Time   PHART 7.435 06/26/2020 2105   PCO2ART 36.6 06/26/2020 2105   PO2ART 58 (L) 06/26/2020 2105   HCO3 24.5 06/26/2020 2105   TCO2 26 06/26/2020 2105   ACIDBASEDEF 8.4 (H) 04/12/2019 1801   O2SAT 90.0 06/26/2020 2105  Coagulation Profile: No results for input(s): INR, PROTIME in the last 168 hours.  Cardiac Enzymes: No results for input(s): CKTOTAL, CKMB, CKMBINDEX, TROPONINI in the last 168 hours.  HbA1C: No results found for: HGBA1C  CBG: Recent Labs  Lab 06/29/20 2031  GLUCAP 93      Signature:    Baltazar Apo, MD, PhD 07/03/2020, 3:36 PM [facility] [phone] or if no answer [phone]

## 2020-07-03 NOTE — Progress Notes (Signed)
Occupational Therapy Treatment Patient Details Name: Devin Harmon MRN: [id number] DOB: [birth year] Today's Date: 07/03/2020    History of present illness Pt adm with acute respiratory failure due to large rt pleural effusion and pulmonary edema due to hpervolemic state due to missed HD. Pt underwent thoracentensis on 7/27 and chest tube placed 7/29. Pt also on bipap at times. PMH - ESRD on HD, HTN   OT comments  Pt making slow progress towards OT goals, presents seated in recliner agreeable to treatment session. Pt performing x3 sit<>stand from recliner, requiring fluctuating level of assist (min-modA) for sit<>stand and for standing balance given continued R lateral/posterior lean. Pt requiring increased assist when fatigued. Pt attempting to march in place however noted increased difficulty with clearing R foot during transitions. Performed additional seated ADL with setup/supervision assist. SpO2 >92% on 2L throughout. Feel pt remains appropriate for SNF level therapies at time of discharge however noted per chart pt's spouse refusing and opting to take pt home (no family present at time of session). If pt to return home recommend max [facility] services. Will continue to follow acutely.   Follow Up Recommendations  SNF;Supervision/Assistance - 24 hour (if family refuses, rec max HH services)    Equipment Recommendations  3 in 1 bedside commode;Wheelchair (measurements OT);Wheelchair cushion (measurements OT)          Precautions / Restrictions Precautions Precautions: Fall Restrictions Weight Bearing Restrictions: No       Mobility Bed Mobility               General bed mobility comments: OOB in recliner upon arrival  Transfers Overall transfer level: Needs assistance Equipment used: Rolling walker (2 wheeled) Transfers: Sit to/from Stand Sit to Stand: Min assist;Mod assist         General transfer comment: performed x3, able to stand with minA from recliner, with prolonged  time in standing requiring up to [facility] for static balance, multimodal cues for hand placement and for controlled descent to chair     Balance Overall balance assessment: Needs assistance Sitting-balance support: No upper extremity supported;Feet supported Sitting balance-Leahy Scale: Fair     Standing balance support: Bilateral upper extremity supported Standing balance-Leahy Scale: Poor Standing balance comment: walker and up to mod assist for static standing. Pt with heavy right and posterior lean.                            ADL either performed or assessed with clinical judgement   ADL Overall ADL's : Needs assistance/impaired Eating/Feeding: Set up;Supervision/ safety;Sitting   Grooming: Wash/dry face;Set up;Sitting Grooming Details (indicate cue type and reason): seated in recliner                              Functional mobility during ADLs: Minimal assistance;Moderate assistance;Rolling walker General ADL Comments: practicing sit<>stands from recliner during session (completed x3) and attempting to march in place, with notable difficulty fully lifting RLE entirely off the floor, R lateral/posterior lean in standing especially when fatigued, requiring minA  up to modA for balance                        Cognition Arousal/Alertness: Awake/alert Behavior During Therapy: Flat affect Overall Cognitive Status: No family/caregiver present to determine baseline cognitive functioning Area of Impairment: Problem solving;Awareness;Safety/judgement;Following commands;Memory;Attention;Orientation  Current Attention Level: Sustained   Following Commands: Follows one step commands inconsistently;Follows one step commands with increased time Safety/Judgement: Decreased awareness of safety;Decreased awareness of deficits Awareness: Intellectual Problem Solving: Slow processing;Decreased initiation;Difficulty sequencing;Requires verbal  cues;Requires tactile cues General Comments: pt with dysarthric speech, per NT this has been pt's baseline, requiring multimodal cues for processing/following commands         Exercises     Shoulder Instructions       General Comments SpO2 >90% on 2L     Pertinent Vitals/ Pain       Pain Assessment: No/denies pain Faces Pain Scale: No hurt  Home Living                                          Prior Functioning/Environment              Frequency  Min 2X/week        Progress Toward Goals  OT Goals(current goals can now be found in the care plan section)  Progress towards OT goals: Progressing toward goals  Acute Rehab OT Goals Patient Stated Goal: not stated, agreeable to working with therapy OT Goal Formulation: With patient Time For Goal Achievement: 07/14/20 Potential to Achieve Goals: Good ADL Goals Pt Will Perform Eating: with set-up;bed level Pt Will Perform Grooming: with set-up;bed level Pt Will Transfer to Toilet: with mod assist;ambulating;bedside commode Additional ADL Goal #1: pt will complete bed mobility supervision level as precursor for adls.  Plan Discharge plan remains appropriate    Co-evaluation                 AM-PAC OT "6 Clicks" Daily Activity     Outcome Measure   Help from another person eating meals?: A Little Help from another person taking care of personal grooming?: A Little Help from another person toileting, which includes using toliet, bedpan, or urinal?: A Lot Help from another person bathing (including washing, rinsing, drying)?: A Lot Help from another person to put on and taking off regular upper body clothing?: A Lot Help from another person to put on and taking off regular lower body clothing?: A Lot 6 Click Score: 14    End of Session Equipment Utilized During Treatment: Gait belt;Rolling walker  OT Visit Diagnosis: Unsteadiness on feet (R26.81);Muscle weakness (generalized) (M62.81)    Activity Tolerance Patient tolerated treatment well   Patient Left in chair;with call bell/phone within reach;with chair alarm set   Nurse Communication Mobility status        Time: 4627-0350 OT Time Calculation (min): 20 min  Charges: OT General Charges $OT Visit: 1 Visit OT Treatments $Therapeutic Activity: 8-22 mins  Lou Cal, OT Acute Rehabilitation Services Pager [phone] Office [phone]    Raymondo Band 07/03/2020, 12:50 PM

## 2020-07-03 NOTE — Progress Notes (Addendum)
Patient ID: Devin Harmon, male   DOB: [birth year], 64 y.o.   MRN: [id number] S: Pt is without complaints.  Chest tube leak noted and Ptx resolved with CXR and chest tube was removed 07/02/20. O:BP 131/77 (BP Location: Right Arm)   Pulse 64   Temp 97.7 F (36.5 C) (Oral)   Resp 20   Ht 5\' 9"  (1.753 m)   Wt 66.5 kg   SpO2 97%   BMI 21.65 kg/m   Intake/Output Summary (Last 24 hours) at 07/03/2020 0933 Last data filed at 07/02/2020 1300 Gross per 24 hour  Intake 120 ml  Output 3500 ml  Net -3380 ml   Intake/Output: I/O last 3 completed shifts: In: 130 [P.O.:120] Out: 3610 [Other:3500; Chest Tube:110]  Intake/Output this shift:  No intake/output data recorded. Weight change: 2.2 kg Gen: NAD CVS: no rub Resp: occ rhonchi Abd: +BS, soft, Nt/nd Ext: no edema, LUE AVF +T/B, tape in place  Recent Labs  Lab 06/29/20 0456 06/29/20 2225 06/30/20 0215 06/30/20 1503 07/01/20 0302 07/02/20 0326 07/03/20 0354  NA 139 140 141 141 138 136 135  K 3.5 3.6 3.6 3.8 3.9 4.4 4.1  CL 100 101 103 104 101 98 95*  CO2 28 30 31 25 28 26 26   GLUCOSE 105* 136* 116* 142* 99 88 83  BUN 23 25* 28* 37* 46* 66* 36*  CREATININE 5.40* 5.21* 5.51* 6.72* 7.84* 9.45* 6.60*  ALBUMIN 3.1* 2.9* 2.9* 2.9* 2.9* 3.0* 3.0*  CALCIUM 8.1* 8.1* 8.0* 8.0* 7.9* 8.0* 7.8*  PHOS 3.7 2.6 3.4 2.8 2.8 3.1 3.7   Liver Function Tests: Recent Labs  Lab 06/27/20 1128 06/28/20 0543 07/01/20 0302 07/02/20 0326 07/03/20 0354  PROT 6.6  --   --   --   --   ALBUMIN  --    < > 2.9* 3.0* 3.0*   < > = values in this interval not displayed.   No results for input(s): LIPASE, AMYLASE in the last 168 hours. No results for input(s): AMMONIA in the last 168 hours. CBC: Recent Labs  Lab 06/26/20 1907 06/26/20 2105 06/28/20 0543 06/29/20 0456 07/02/20 0326  WBC 10.9*   < > 8.0 8.6 8.8  NEUTROABS  --   --   --   --  6.6  HGB 11.4*   < > 11.0* 11.7* 12.6*  HCT 35.4*   < > 36.1* 37.4* 39.6  MCV 91.2  --  92.8 92.8 91.2  PLT  194   < > 195 188 166   < > = values in this interval not displayed.   Cardiac Enzymes: No results for input(s): CKTOTAL, CKMB, CKMBINDEX, TROPONINI in the last 168 hours. CBG: Recent Labs  Lab 06/29/20 2031  GLUCAP 93    Iron Studies: No results for input(s): IRON, TIBC, TRANSFERRIN, FERRITIN in the last 72 hours. Studies/Results: DG CHEST PORT 1 VIEW  Result Date: 07/03/2020 CLINICAL DATA:  Chest tube removal. EXAM: PORTABLE CHEST 1 VIEW COMPARISON:  07/02/2020. FINDINGS: Interim removal of right chest tube. Small to moderate pneumothorax noted. Dual-lumen catheter noted stable position. Stable cardiomegaly. Bilateral interstitial prominence noted. Interstitial edema and/or pneumonitis cannot be excluded. Small bilateral pleural effusions. Diffuse right chest wall subcutaneous emphysema. IMPRESSION: 1. Interim removal of right chest tube. Small to moderate right-sided pneumothorax noted. 2.  Dual-lumen catheter stable position. 3. Cardiomegaly with diffuse bilateral pulmonary interstitial prominence and small bilateral pleural effusions suggesting CHF. Pneumonitis cannot be excluded. Critical Value/emergent results were called by telephone at the time  of interpretation on 07/03/2020 at 7:59 am to nurse Jarrett Soho , who verbally acknowledged these results. Electronically Signed   By: Marcello Moores  Register   On: 07/03/2020 08:01   DG CHEST PORT 1 VIEW  Result Date: 07/02/2020 CLINICAL DATA:  Status post right chest tube placement for pneumothorax. EXAM: PORTABLE CHEST 1 VIEW COMPARISON:  Single-view of the chest earlier today. FINDINGS: Pigtail catheter is seen in the inferior aspect of the right chest. Tiny residual pneumothorax of less than 5% is seen. There is subcutaneous emphysema in the right chest wall. The left lung is expanded and clear. Cardiomegaly again seen. Atherosclerosis is also again noted. Dialysis catheter is unchanged. IMPRESSION: Tiny residual right pneumothorax after chest tube  placement. No new abnormality. Electronically Signed   By: Inge Rise M.D.   On: 07/02/2020 15:35   DG CHEST PORT 1 VIEW  Result Date: 07/02/2020 CLINICAL DATA:  Pleural effusion and chest tube. EXAM: PORTABLE CHEST 1 VIEW COMPARISON:  06/28/2020 FINDINGS: Left central venous catheter is unchanged in position. A pigtail chest tube remains in the right base. Positioning is more lateral than on the previous study. The right pneumothorax has increased. This may indicate nonfunctioning tube. Subcutaneous emphysema on the right is increased. Atelectasis in the right base. Cardiac enlargement. IMPRESSION: Increasing size of right pneumothorax with change in position of chest tube. This may indicate nonfunctioning tube. Increasing subcutaneous emphysema. Electronically Signed   By: Lucienne Capers M.D.   On: 07/02/2020 06:40   . amLODipine  10 mg Oral Daily  . Chlorhexidine Gluconate Cloth  6 each Topical Q0600  . ferric citrate  420 mg Oral TID WC  . heparin  5,000 Units Subcutaneous Q8H  . metoprolol succinate  12.5 mg Oral Daily  . multivitamin  1 tablet Oral Daily  . sodium chloride flush  10 mL Intracatheter Q8H  . sodium chloride flush  3 mL Intravenous Once    BMET    Component Value Date/Time   NA 135 07/03/2020 0354   K 4.1 07/03/2020 0354   CL 95 (L) 07/03/2020 0354   CO2 26 07/03/2020 0354   GLUCOSE 83 07/03/2020 0354   BUN 36 (H) 07/03/2020 0354   CREATININE 6.60 (H) 07/03/2020 0354   CREATININE 2.04 (H) 06/20/2011 1601   CALCIUM 7.8 (L) 07/03/2020 0354   GFRNONAA 8 (L) 07/03/2020 0354   GFRAA 9 (L) 07/03/2020 0354   CBC    Component Value Date/Time   WBC 8.8 07/02/2020 0326   RBC 4.34 07/02/2020 0326   HGB 12.6 (L) 07/02/2020 0326   HCT 39.6 07/02/2020 0326   PLT 166 07/02/2020 0326   MCV 91.2 07/02/2020 0326   MCH 29.0 07/02/2020 0326   MCHC 31.8 07/02/2020 0326   RDW 18.6 (H) 07/02/2020 0326   LYMPHSABS 0.9 07/02/2020 0326   MONOABS 0.7 07/02/2020 0326    EOSABS 0.4 07/02/2020 0326   BASOSABS 0.1 07/02/2020 0326    Dialysis Orders: MWF- [facility] 4.5 hrs, BFR400, DFR800, EDW 91.5kg (now is 66 kg),2K/2.5Ca  Access:TDC. LU AVF maturing - ready for use starting 06/16/20 Heparin3000 Mircera119mcg q2wks - last 06/20/20 Venofer 100mg  qwk Calcitriol1.66mcg PO qHD Auryxia 2 tab AC TID  Assessment and Plan: 1. Acute hypoxic respiratory failure- due to noncompliance with HD.  Has had aggressive HD with UF as well as thoracentesis.  S/p chest tube on 06/28/20.  Net negative 23 kg. 2. Pneumothorax- due to chest tube dislodgement.  Resolved with suction and chest tube removed 07/02/20.  For  repeat CXR today. 3. ESRD- continue with HD on MWF schedule.  Significant reduction in EDW. 4. HTN- improved with UF 5. Anemia due to ESRD- no ESA with Hgb >12 6. Right Pleural effusion- transudative, s/p chest tube 06/28/20 and removed 07/02/20. 7. Vascular access- LUE AVF +T/B and can start to use this week 8. CKD-MBD- cont with binders 9. H/o CVA- with right hemiparesis. 10. 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 EF 40-45%, hypokinesis of basal to mid inferior myocardium.  Also grade I DD. 11. Disposition- discharge to home (wife declined SNF placement) when stable.   Donetta Potts, MD [employer] [phone]

## 2020-07-04 ENCOUNTER — Inpatient Hospital Stay (HOSPITAL_COMMUNITY): Payer: 59

## 2020-07-04 ENCOUNTER — Telehealth: Payer: Self-pay | Admitting: Internal Medicine

## 2020-07-04 DIAGNOSIS — J9601 Acute respiratory failure with hypoxia: Secondary | ICD-10-CM | POA: Insufficient documentation

## 2020-07-04 DIAGNOSIS — E8770 Fluid overload, unspecified: Secondary | ICD-10-CM | POA: Insufficient documentation

## 2020-07-04 LAB — CBC WITH DIFFERENTIAL/PLATELET
Abs Immature Granulocytes: 0.07 10*3/uL (ref 0.00–0.07)
Basophils Absolute: 0 10*3/uL (ref 0.0–0.1)
Basophils Relative: 1 %
Eosinophils Absolute: 0.2 10*3/uL (ref 0.0–0.5)
Eosinophils Relative: 3 %
HCT: 39.3 % (ref 39.0–52.0)
Hemoglobin: 12.5 g/dL — ABNORMAL LOW (ref 13.0–17.0)
Immature Granulocytes: 1 %
Lymphocytes Relative: 16 %
Lymphs Abs: 1.1 10*3/uL (ref 0.7–4.0)
MCH: 28.4 pg (ref 26.0–34.0)
MCHC: 31.8 g/dL (ref 30.0–36.0)
MCV: 89.3 fL (ref 80.0–100.0)
Monocytes Absolute: 0.7 10*3/uL (ref 0.1–1.0)
Monocytes Relative: 10 %
Neutro Abs: 4.8 10*3/uL (ref 1.7–7.7)
Neutrophils Relative %: 69 %
Platelets: 215 10*3/uL (ref 150–400)
RBC: 4.4 MIL/uL (ref 4.22–5.81)
RDW: 18 % — ABNORMAL HIGH (ref 11.5–15.5)
WBC: 6.8 10*3/uL (ref 4.0–10.5)
nRBC: 0 % (ref 0.0–0.2)

## 2020-07-04 LAB — RENAL FUNCTION PANEL
Albumin: 2.8 g/dL — ABNORMAL LOW (ref 3.5–5.0)
Anion gap: 13 (ref 5–15)
BUN: 62 mg/dL — ABNORMAL HIGH (ref 8–23)
CO2: 26 mmol/L (ref 22–32)
Calcium: 7.9 mg/dL — ABNORMAL LOW (ref 8.9–10.3)
Chloride: 95 mmol/L — ABNORMAL LOW (ref 98–111)
Creatinine, Ser: 9.11 mg/dL — ABNORMAL HIGH (ref 0.61–1.24)
GFR calc Af Amer: 6 mL/min — ABNORMAL LOW (ref >60)
GFR calc non Af Amer: 5 mL/min — ABNORMAL LOW (ref >60)
Glucose, Bld: 91 mg/dL (ref 70–99)
Phosphorus: 3.4 mg/dL (ref 2.5–4.6)
Potassium: 4.5 mmol/L (ref 3.5–5.1)
Sodium: 134 mmol/L — ABNORMAL LOW (ref 135–145)

## 2020-07-04 LAB — MAGNESIUM: Magnesium: 2.1 mg/dL (ref 1.7–2.4)

## 2020-07-04 MED ORDER — HEPARIN SODIUM (PORCINE) 1000 UNIT/ML IJ SOLN
INTRAMUSCULAR | Status: AC
  Administered 2020-07-04: 3400 [IU]
  Filled 2020-07-04: qty 4

## 2020-07-04 MED ORDER — ACETAMINOPHEN 325 MG PO TABS
ORAL_TABLET | ORAL | Status: AC
  Filled 2020-07-04: qty 2

## 2020-07-04 NOTE — Care Management Important Message (Signed)
Important Message  Patient Details  Name: Devin Harmon MRN: [id number] Date of Birth: 10-24-1956   Medicare Important Message Given:  Yes     Shelda Altes 07/04/2020, 1:05 PM

## 2020-07-04 NOTE — Progress Notes (Signed)
Pt discharged from unit. Medication/discharge instruction given to Mrs.Towers  [facility]  Phoebe Sharps, RN

## 2020-07-04 NOTE — Telephone Encounter (Signed)
Follow up needs to be next week----okay to make it just 15 minutes if needed (can also add on at the end of morning Tuesday or Wednesday for 30 minutes)  We can do CXR then

## 2020-07-04 NOTE — Progress Notes (Signed)
PT Cancellation Note  Patient Details Name: Devin Harmon MRN: [id number] DOB: 06-28-1956   Cancelled Treatment:    Reason Eval/Treat Not Completed: Patient at procedure or test/unavailable  Pt at HD and then to d/c per RN. Will f/u as able .  Abran Richard, PT Acute Rehab Services Pager [phone] [facility] [phone]   Karlton Lemon 07/04/2020, 2:06 PM

## 2020-07-04 NOTE — TOC Transition Note (Signed)
Transition of Care [facility]) - CM/SW Discharge Note   Patient Details  Name: Devin Harmon MRN: [id number] Date of Birth: Apr 09, 1956  Transition of Care [facility]) CM/SW Contact:  Joanne Chars, LCSW Phone Number: 07/04/2020, 11:43 AM   Clinical Narrative:   CSW met with pt and wife Devin Harmon to discuss discharge plan.  Pt and wife declined [facility] services and declined any recommended DME as well.  Pt lives at home with wife, who will provide transportation home.  No other needs.      Final next level of care: Home/Self Care Barriers to Discharge: No Barriers Identified   Patient Goals and CMS Choice        Discharge Placement                       Discharge Plan and Services                                     Social Determinants of Health (SDOH) Interventions     Readmission Risk Interventions Readmission Risk Prevention Plan 04/15/2019 03/30/2019  Transportation Screening - Complete  PCP or Specialist Appt within 5-7 Days - Complete  Home Care Screening - Complete  Medication Review (RN CM) - Complete  Palliative Care Screening Not Applicable -  Some recent data might be hidden

## 2020-07-04 NOTE — Discharge Summary (Signed)
Physician Discharge Summary  Devin Harmon OBS:[id number] DOB: Oct 03, 1956 DOA: 06/26/2020  PCP: Venia Carbon, MD  Admit date: 06/26/2020 Discharge date: 07/04/2020  Admitted From: Home Disposition: Home  Recommendations for Outpatient Follow-up:  1. Follow up with PCP in 5 days 2. Repeat chest x-ray when seen by PCP. 3. Follow-up outpatient hemodialysis as scheduled 4. Follow-up with cardiology in 1 to 2 weeks for further work-up. 5. Please obtain BMP/CBC in one week 6. Please follow up with your PCP on the following pending results: Unresulted Labs (From admission, onward) Comment          Start     Ordered   07/04/20 0500  CBC with Differential/Platelet  Daily,   R     Question:  Specimen collection method  Answer:  Lab=Lab collect   07/03/20 1816   06/28/20 0500  Renal function panel (daily at 0500)  Daily,   R      06/27/20 1710   06/28/20 0500  Magnesium  Daily,   R      06/27/20 1710   Signed and Held  Renal function panel  Once,   R        Signed and Held   Signed and Held  CBC  Once,   R        Signed and Held   Signed and Held  CBC  Once,   R       Question:  Specimen collection method  Answer:  Lab=Lab collect   Signed and Held   Signed and Held  Renal function panel  Once,   R       Question:  Specimen collection method  Answer:  Lab=Lab collect   Signed and Held   Signed and Held  CBC  Once,   R       Question:  Specimen collection method  Answer:  Lab=Lab collect   Signed and Held   Signed and Held  CBC  Once,   R       Question:  Specimen collection method  Answer:  Lab=Lab collect   Signed and Held           Home Health: Yes Equipment/Devices: Commode  Discharge Condition: Stable CODE STATUS: Full code Diet recommendation: Low-sodium/cardiac  Subjective: Seen and examined this morning. He was sitting at the edge of the bed. Felt better. Denied any shortness of breath or any other complaint. Has baseline dysarthria.  Brief/Interim Summary:  64 year old black male Previous herpes zoster 2007 ESRD since 2019 however started dialysis 04/17/2019 admission TTS through right sided PermCath Poorly controlled hypertension Anemia of renal disease Presented to the emergency room 06/26/2020 severely short of breath patient was on BiPAP at the time. He was diagnosed with large right-sided pleural effusion. He was admitted under hospital service. Reportedly, he had missed his hemodialysis sessions. CCM consulted secondary to large right-sided pleural effusion pulmonary edema in the setting of missed dialysis.  Right thoracentesis performed on 06/26/2020 revealed 2.3 L clear yellow fluid-and by analysis, it was transudate of fluid. He was then weaned down from BiPAP to 3 L of nasal cannula oxygen. Echo was obtained which showed ejection fraction of 35 to 45%. Nephrology was also consulted and patient continued to receive his scheduled hemodialysis on MWF schedule. Blood pressure was very well controlled. Even after thoracentesis, patient developed moderate to large pleural effusion very soon and ended up having chest tube placement on 06/28/2020 by PCCM with removal of 2 L of fluid. Chest  x-ray performed on 07/02/2020 showed new right-sided pneumothorax and more lateral positioning of pigtail. Pleural fluid culture remained negative. Covid negative. Chest tube was finally removed on 07/02/2020. In last 24 hours to 48 hours, patient has been weaned down to room air and saturating well over 90%. PCCM continued to get serial chest x-ray on daily basis which shows no difference in his mild pleural effusion and mild pneumothorax. PCCM cleared the patient for discharge. Patient was also seen by PT OT who would strongly recommended SNF discharge however patient's wife was adamant to take him home with home health. Please note that I assumed this patient's care only today. With new ejection fraction of 35 to 40%, I recommended to consult cardiology and have further cardiac  work-up as inpatient instead of outpatient however wife was very adamant on getting patient discharged home today at any cost. Patient was scheduled to have hemodialysis in the afternoon. Initially wife was even asking [place] to discharge him prior to hemodialysis and take him to the dialysis but then she realized that it would be too much for her so she agreed for the patient to have dialysis here in the afternoon and then go home. I strongly advised her to have patient follow-up with PCP as soon as possible, within 5 days and repeat chest x-ray as per recommendation from PCCM to follow-up on the pneumothorax and pleural effusion. I also strongly recommended that patient should follow up with cardiology as outpatient for further work-up of new systolic congestive heart failure. I also informed her that patient is at risk of having worsening of pleural effusion and pneumothorax. She verbalized understanding all of that and decided to take patient home. At the time of discharge, patient is a stable.  Discharge Diagnoses:  Active Problems:   Essential hypertension, benign   BPH without obstruction/lower urinary tract symptoms   End stage renal disease (HCC)   Pleural effusion   Acute respiratory failure ([place])   Pneumothorax    Discharge Instructions  Discharge Instructions    Diet - low sodium heart healthy   Complete by: As directed    Increase activity slowly   Complete by: As directed    No wound care   Complete by: As directed      Allergies as of 07/04/2020      Reactions   Sulfa Antibiotics Hives   Sulfonamide Derivatives Hives   Chlorthalidone Other (See Comments)   felt "bad" and ED   Hydrocodone Nausea Only   dizzy      Medication List    STOP taking these medications   acetaminophen 500 MG tablet Commonly known as: TYLENOL   labetalol 100 MG tablet Commonly known as: NORMODYNE   oxyCODONE-acetaminophen 7.5-325 MG tablet Commonly known as: Percocet     TAKE these  medications   amLODipine 10 MG tablet Commonly known as: NORVASC TAKE 1 TABLET BY MOUTH EVERY DAY   Auryxia 1 GM 210 MG(Fe) tablet Generic drug: ferric citrate Take 420 mg by mouth 3 (three) times daily with meals.   cetirizine 10 MG tablet Commonly known as: ZYRTEC Take 10 mg by mouth daily as needed for allergies.   fluticasone 50 MCG/ACT nasal spray Commonly known as: FLONASE Place 1-2 sprays into both nostrils daily as needed for allergies.   hydrocortisone cream 1 % Apply 1 application topically daily as needed (rash).   neomycin-bacitracin-polymyxin ointment Commonly known as: NEOSPORIN Apply 1 application topically daily as needed for wound care.   Rena-Vite Rx  1 MG Tabs Take 1 mg by mouth daily.            Durable Medical Equipment  (From admission, onward)         Start     Ordered   07/04/20 1036  For home use only DME Bedside commode  Once       Question:  Patient needs a bedside commode to treat with the following condition  Answer:  Balance problem   07/04/20 1035   07/03/20 1320  DME 3-in-1  Once        07/03/20 1320   07/03/20 1320  DME standard manual wheelchair with seat cushion  Once       Comments: Patient suffers from debility which impairs their ability to perform daily activities like dressing, feeding, and toileting in the home.  A walker will not resolve issue with performing activities of daily living. A wheelchair will allow patient to safely perform daily activities. Patient can safely propel the wheelchair in the home or has a caregiver who can provide assistance. Length of need Lifetime. Accessories: elevating leg rests (ELRs), wheel locks, extensions and anti-tippers.   07/03/20 1320          Follow-up Information    Deberah Pelton, NP Follow up on 07/11/2020.   Specialty: Cardiology Why: Please arrive 15 minutes early for your 11:45am post-hospital cardiology appointment.  Contact information: [address] [place] [address] [phone]        Viviana Simpler I, MD Follow up in 5 day(s).   Specialties: Internal Medicine, Pediatrics Contact information: [place] [address] [phone]              Allergies  Allergen Reactions  . Sulfa Antibiotics Hives  . Sulfonamide Derivatives Hives  . Chlorthalidone Other (See Comments)    felt "bad" and ED   . Hydrocodone Nausea Only    dizzy    Consultations: PCCM and nephrology and IR   Procedures/Studies: DG Chest 2 View  Result Date: 06/26/2020 CLINICAL DATA:  Shortness of breath for 5 days.  Fever. EXAM: CHEST - 2 VIEW COMPARISON:  11/15/2019 FINDINGS: Left-sided dialysis catheter tip at superior caval/atrial junction. Patient rotated minimally left. Moderate cardiomegaly. Atherosclerosis in the transverse aorta. A large right pleural effusion. No left-sided pleural fluid. No pneumothorax. Suspect mild pulmonary venous congestion. Right lower and midlung airspace disease is most likely compressive atelectasis. IMPRESSION: Large right pleural effusion with likely secondary compressive atelectasis of the inferior and mid right lung. Cardiomegaly with suspicion of mild pulmonary venous congestion. Aortic Atherosclerosis (ICD10-I70.0). Consider repeat plain films after thoracentesis to more entirely evaluate the right lung. Electronically Signed   By: Abigail Miyamoto M.D.   On: 06/26/2020 09:29   DG Chest Port 1 View  Result Date: 07/04/2020 CLINICAL DATA:  Pneumothorax EXAM: PORTABLE CHEST 1 VIEW COMPARISON:  July 03, 2020 FINDINGS: The pneumothorax on the right is again noted, essentially stable in size, without tension component. There is extensive subcutaneous air on the right. A small amount of loculated pleural effusion is noted on the right inferolaterally. There is no edema or airspace opacity. There is atelectatic change in the right base. There is cardiomegaly with pulmonary vascularity normal.  Central catheter tip is in the superior vena cava. No adenopathy. There is aortic atherosclerosis. No bone lesions. IMPRESSION: Stable right-sided pneumothorax without tension component. Extensive subcutaneous air again noted on the right. Small amount of loculated effusion inferolateral right  base. Mild right base atelectasis. Lungs elsewhere clear. Stable cardiomegaly.  Stable central catheter positioning. Aortic Atherosclerosis (ICD10-I70.0). Electronically Signed   By: Lowella Grip III M.D.   On: 07/04/2020 08:23   DG CHEST PORT 1 VIEW  Result Date: 07/03/2020 CLINICAL DATA:  Chest tube removal. EXAM: PORTABLE CHEST 1 VIEW COMPARISON:  07/02/2020. FINDINGS: Interim removal of right chest tube. Small to moderate pneumothorax noted. Dual-lumen catheter noted stable position. Stable cardiomegaly. Bilateral interstitial prominence noted. Interstitial edema and/or pneumonitis cannot be excluded. Small bilateral pleural effusions. Diffuse right chest wall subcutaneous emphysema. IMPRESSION: 1. Interim removal of right chest tube. Small to moderate right-sided pneumothorax noted. 2.  Dual-lumen catheter stable position. 3. Cardiomegaly with diffuse bilateral pulmonary interstitial prominence and small bilateral pleural effusions suggesting CHF. Pneumonitis cannot be excluded. Critical Value/emergent results were called by telephone at the time of interpretation on 07/03/2020 at 7:59 am to nurse Jarrett Soho , who verbally acknowledged these results. Electronically Signed   By: Marcello Moores  Register   On: 07/03/2020 08:01   DG CHEST PORT 1 VIEW  Result Date: 07/02/2020 CLINICAL DATA:  Status post right chest tube placement for pneumothorax. EXAM: PORTABLE CHEST 1 VIEW COMPARISON:  Single-view of the chest earlier today. FINDINGS: Pigtail catheter is seen in the inferior aspect of the right chest. Tiny residual pneumothorax of less than 5% is seen. There is subcutaneous emphysema in the right chest wall. The left lung is  expanded and clear. Cardiomegaly again seen. Atherosclerosis is also again noted. Dialysis catheter is unchanged. IMPRESSION: Tiny residual right pneumothorax after chest tube placement. No new abnormality. Electronically Signed   By: Inge Rise M.D.   On: 07/02/2020 15:35   DG CHEST PORT 1 VIEW  Result Date: 07/02/2020 CLINICAL DATA:  Pleural effusion and chest tube. EXAM: PORTABLE CHEST 1 VIEW COMPARISON:  06/28/2020 FINDINGS: Left central venous catheter is unchanged in position. A pigtail chest tube remains in the right base. Positioning is more lateral than on the previous study. The right pneumothorax has increased. This may indicate nonfunctioning tube. Subcutaneous emphysema on the right is increased. Atelectasis in the right base. Cardiac enlargement. IMPRESSION: Increasing size of right pneumothorax with change in position of chest tube. This may indicate nonfunctioning tube. Increasing subcutaneous emphysema. Electronically Signed   By: Lucienne Capers M.D.   On: 07/02/2020 06:40   DG CHEST PORT 1 VIEW  Result Date: 06/28/2020 CLINICAL DATA:  64 year old male status post chest tube placement on the right. EXAM: PORTABLE CHEST 1 VIEW COMPARISON:  Earlier radiograph dated 06/28/2020. FINDINGS: Interval placement of a right-sided chest tube with significant decrease in the right pleural effusion and partial aeration of the right lung. No pneumothorax. The dialysis catheter is in similar position. There is cardiomegaly. Atherosclerotic calcification of the aorta. No acute osseous pathology. IMPRESSION: Interval placement of a right-sided chest tube with significant decrease in the right pleural effusion and partial aeration of the right lung. Electronically Signed   By: Anner Crete M.D.   On: 06/28/2020 16:21   DG CHEST PORT 1 VIEW  Result Date: 06/28/2020 CLINICAL DATA:  Pleural effusion EXAM: PORTABLE CHEST 1 VIEW COMPARISON:  June 26, 2020 FINDINGS: There is essentially complete  opacification of the right hemithorax with large pleural effusion. There is a small left pleural effusion. No edema or consolidation noted on the left. Right lung obscured by presumed pleural effusion, precluding assessment for potential underlying infiltrate. Heart is upper normal in size. Pulmonary vascularity on the left peers normal. Pulmonary  vascular on the right obscured. There is aortic atherosclerosis. Central catheter tip is the cavoatrial junction. No pneumothorax. No bone lesions. IMPRESSION: Essentially complete opacification of the right hemithorax due to large pleural effusion. A degree of atelectasis and/or consolidation on the right cannot be excluded in this circumstance. There is a small left pleural effusion. No edema or airspace opacity on the left. Cardiac silhouette appears stable. Stable central catheter positioning. Aortic Atherosclerosis (ICD10-I70.0). Electronically Signed   By: Lowella Grip III M.D.   On: 06/28/2020 06:13   DG CHEST PORT 1 VIEW  Result Date: 06/26/2020 CLINICAL DATA:  Dyspnea. EXAM: PORTABLE CHEST 1 VIEW COMPARISON:  Radiograph earlier this day. FINDINGS: Progressive right pleural effusion since exam earlier this day, now occupying the lower 1/2 of right hemithorax. Increasing opacity throughout the right hemithorax likely due to layering effusion and atelectasis. Left-sided dialysis catheter remains in place. Cardiomegaly is stable. Left costophrenic angle is not included in the field of view limiting assessment for left pleural effusion. There is no visualized pneumothorax. IMPRESSION: 1. Progressive right pleural effusion since exam earlier this day, now occupying the lower 1/2 of right hemithorax. 2. Increasing opacity throughout the right hemithorax likely due to layering effusion and atelectasis. 3. Stable cardiomegaly. Previous left lung base opacity not well assessed on the current exam given positioning. Electronically Signed   By: Keith Rake M.D.    On: 06/26/2020 21:31   DG Chest Port 1 View  Result Date: 06/26/2020 CLINICAL DATA:  Post thoracentesis EXAM: PORTABLE CHEST 1 VIEW COMPARISON:  June 26, 2020, prior to thoracentesis FINDINGS: LEFT-sided vascular access device terminates at the caval to atrial junction. Marked decrease in size of a RIGHT-sided pleural effusion since the previous evaluation. No pneumothorax. Persistent RIGHT basilar airspace disease. Vascular congestion. Cardiomediastinal contours accentuated by low lung volumes, unchanged from the previous exam. On limited assessment visualized skeletal structures without acute bone finding. IMPRESSION: 1. Marked decrease in size of a RIGHT-sided pleural effusion following thoracentesis. No pneumothorax is evident. 2. Persistent RIGHT basilar airspace disease. 3. Subtle LEFT basilar opacities likely atelectasis with diminished lung volume since the comparison evaluation. Electronically Signed   By: Zetta Bills M.D.   On: 06/26/2020 12:24   ECHOCARDIOGRAM COMPLETE  Result Date: 06/27/2020    ECHOCARDIOGRAM REPORT   Patient Name:   YOSTIN MALACARA Hopes Date of Exam: 06/27/2020 Medical Rec #:  [id number]       Height:       69.0 in Accession #:    [id number]      Weight:       194.9 lb Date of Birth:  1956-10-07        BSA:          2.043 m Patient Age:    6 years        BP:           167/81 mmHg Patient Gender: M               HR:           77 bpm. Exam Location:  Inpatient Procedure: 2D Echo                                 MODIFIED REPORT: This report was modified by Skeet Latch MD on 06/27/2020 due to wall motion.  Indications:     CHF-Acute Diastolic 226.33 / H54.56  History:  Patient has prior history of Echocardiogram examinations, most                  recent 04/13/2019. Stroke. Respiratory distress, right pleural                  effusion. End stage renal disease. Emergency dialysis                  overnight.  Sonographer:     Darlina Sicilian RDCS Referring Phys:  [id number] [place] Shi Diagnosing Phys: Skeet Latch MD IMPRESSIONS  1. Hypokinesis of the basal to mid inferior myocardium. Left ventricular ejection fraction, by estimation, is 40 to 45%. The left ventricle has mildly decreased function. The left ventricle demonstrates regional wall motion abnormalities (see scoring diagram/findings for description). Left ventricular diastolic parameters are consistent with Grade I diastolic dysfunction (impaired relaxation).  2. Right ventricular systolic function is normal. The right ventricular size is normal. There is mildly elevated pulmonary artery systolic pressure.  3. Left atrial size was severely dilated.  4. The mitral valve is normal in structure. Trivial mitral valve regurgitation. No evidence of mitral stenosis.  5. The aortic valve is tricuspid. Aortic valve regurgitation is trivial. No aortic stenosis is present.  6. The inferior vena cava is dilated in size with >50% respiratory variability, suggesting right atrial pressure of 8 mmHg. FINDINGS  Left Ventricle: Hypokinesis of the basal to mid inferior myocardium. Left ventricular ejection fraction, by estimation, is 40 to 45%. The left ventricle has mildly decreased function. The left ventricle demonstrates regional wall motion abnormalities. The left ventricular internal cavity size was normal in size. There is no left ventricular hypertrophy. Left ventricular diastolic parameters are consistent with Grade I diastolic dysfunction (impaired relaxation). Right Ventricle: The right ventricular size is normal. No increase in right ventricular wall thickness. Right ventricular systolic function is normal. There is mildly elevated pulmonary artery systolic pressure. The tricuspid regurgitant velocity is 2.70  m/s, and with an assumed right atrial pressure of 8 mmHg, the estimated right ventricular systolic pressure is 53.9 mmHg. Left Atrium: Left atrial size was severely dilated. Right Atrium: Right atrial size was normal in  size. Pericardium: There is no evidence of pericardial effusion. Mitral Valve: The mitral valve is normal in structure. Normal mobility of the mitral valve leaflets. Trivial mitral valve regurgitation. No evidence of mitral valve stenosis. Tricuspid Valve: The tricuspid valve is normal in structure. Tricuspid valve regurgitation is trivial. No evidence of tricuspid stenosis. Aortic Valve: The aortic valve is tricuspid. . There is mild thickening and mild calcification of the aortic valve. Aortic valve regurgitation is trivial. No aortic stenosis is present. There is mild thickening of the aortic valve. There is mild calcification of the aortic valve. Pulmonic Valve: The pulmonic valve was normal in structure. Pulmonic valve regurgitation is trivial. No evidence of pulmonic stenosis. Aorta: The aortic root is normal in size and structure. Venous: The inferior vena cava is dilated in size with greater than 50% respiratory variability, suggesting right atrial pressure of 8 mmHg. IAS/Shunts: No atrial level shunt detected by color flow Doppler.  LEFT VENTRICLE PLAX 2D LVIDd:         5.00 cm      Diastology LVIDs:         4.10 cm      LV e' lateral:   5.66 cm/s LV PW:         0.80 cm      LV E/e' lateral: 20.3  LV IVS:        1.00 cm      LV e' medial:    4.46 cm/s LVOT diam:     2.10 cm      LV E/e' medial:  25.8 LV SV:         104 LV SV Index:   51 LVOT Area:     3.46 cm  LV Volumes (MOD) LV vol d, MOD A2C: 291.0 ml LV vol d, MOD A4C: 276.0 ml LV vol s, MOD A2C: 176.0 ml LV vol s, MOD A4C: 191.0 ml LV SV MOD A2C:     115.0 ml LV SV MOD A4C:     276.0 ml LV SV MOD BP:      100.4 ml RIGHT VENTRICLE RV S prime:     10.40 cm/s TAPSE (M-mode): 2.4 cm LEFT ATRIUM             Index       RIGHT ATRIUM           Index LA diam:        4.20 cm 2.06 cm/m  RA Area:     18.90 cm LA Vol (A2C):   91.9 ml 44.97 ml/m RA Volume:   51.90 ml  25.40 ml/m LA Vol (A4C):   86.3 ml 42.23 ml/m LA Biplane Vol: 91.7 ml 44.88 ml/m  AORTIC  VALVE LVOT Vmax:   164.00 cm/s LVOT Vmean:  103.000 cm/s LVOT VTI:    0.299 m  AORTA Ao Root diam: 3.20 cm Ao Asc diam:  3.50 cm MITRAL VALVE                TRICUSPID VALVE MV Area (PHT): 7.02 cm     TR Peak grad:   29.2 mmHg MV Decel Time: 108 msec     TR Vmax:        270.00 cm/s MV E velocity: 115.00 cm/s MV A velocity: 113.00 cm/s  SHUNTS MV E/A ratio:  1.02         Systemic VTI:  0.30 m                             Systemic Diam: 2.10 cm Skeet Latch MD Electronically signed by Skeet Latch MD Signature Date/Time: 06/27/2020/11:17:16 AM    Final (Updated)       Discharge Exam: Vitals:   07/04/20 0838 07/04/20 1005  BP: 135/76 137/80  Pulse: (!) 56   Resp: 18   Temp: 98.2 F (36.8 C)   SpO2: 99%    Vitals:   07/04/20 0026 07/04/20 0426 07/04/20 0838 07/04/20 1005  BP: 134/73 137/76 135/76 137/80  Pulse: (!) 59 60 (!) 56   Resp:  16 18   Temp:  98 F (36.7 C) 98.2 F (36.8 C)   TempSrc:  Oral Oral   SpO2: 99% 98% 99%   Weight: 67.7 kg 67.7 kg    Height:        General: Pt is alert, awake, not in acute distress Cardiovascular: RRR, S1/S2 +, no rubs, no gallops Respiratory: Diminished breath sounds at the bases. Subcutaneous emphysema. No wheezes or crackles or rhonchi. Abdominal: Soft, NT, ND, bowel sounds + Extremities: no edema, no cyanosis    The results of significant diagnostics from this hospitalization (including imaging, microbiology, ancillary and laboratory) are listed below for reference.     Microbiology: Recent Results (from the past 240 hour(s))  Blood culture (routine  x 2)     Status: None   Collection Time: 06/26/20  9:43 AM   Specimen: BLOOD RIGHT FOREARM  Result Value Ref Range Status   Specimen Description BLOOD RIGHT FOREARM  Final   Special Requests   Final    BOTTLES DRAWN AEROBIC AND ANAEROBIC Blood Culture adequate volume   Culture   Final    NO GROWTH 5 DAYS Performed at [facility], [address] [place], [place]  [address]    Report Status 07/01/2020 FINAL  Final  Blood culture (routine x 2)     Status: None   Collection Time: 06/26/20  9:48 AM   Specimen: BLOOD  Result Value Ref Range Status   Specimen Description BLOOD RIGHT ANTECUBITAL  Final   Special Requests   Final    BOTTLES DRAWN AEROBIC AND ANAEROBIC Blood Culture adequate volume   Culture   Final    NO GROWTH 5 DAYS Performed at [facility], [place] [address] [place], [place] [address]    Report Status 07/01/2020 FINAL  Final  SARS Coronavirus 2 by RT PCR     Status: None   Collection Time: 06/26/20  9:53 AM  Result Value Ref Range Status   SARS Coronavirus 2 NEGATIVE NEGATIVE Final    Comment: (NOTE) Result indicates the ABSENCE of SARS-CoV-2 RNA in the patient specimen.   The lowest concentration of SARS-CoV-2 viral copies this assay can detect in nasopharyngeal swab specimens is 500 copies / mL.  A negative result does not preclude SARS-CoV-2 infection and should not be used as the sole basis for patient management decisions. A negative result may occur with improper specimen collection / handling, submission of a specimen other than nasopharyngeal swab, presence of viral mutation(s) within the areas targeted by this assay, and inadequate number of viral copies (<500 copies / mL) present.  Negative results must be combined with clinical observations, patient history, and epidemiological information.   The expected result is NEGATIVE.   Patient Fact Sheet:  BlogSelections.co.uk    Provider Fact Sheet:  https://lucas.com/    This test is not yet approved or cleared by the [place] FDA and  has been [place] orized for detection and/or diagnosis of SARS-CoV-2 by FDA under an Emergency Use Authorization (EUA).  This EUA will remain in effect (meaning this test can be used) for the duration of  the COVID-19 declaration under Section 564(b)(1) of the Act, 21 U.S.C. section  360bbb-3(b)(1), unless the authorization is terminated or revoked sooner  Performed at [facility], [place] [address] [place], [place] [address]   Body fluid culture     Status: None   Collection Time: 06/26/20 12:31 PM   Specimen: Pleura; Body Fluid  Result Value Ref Range Status   Specimen Description PLEURAL FLUID RIGHT LUNG  Final   Special Requests NONE  Final   Gram Stain   Final    RARE WBC PRESENT, PREDOMINANTLY MONONUCLEAR NO ORGANISMS SEEN    Culture   Final    NO GROWTH 3 DAYS Performed at [facility], [address], [place], [place] [address]    Report Status 06/29/2020 FINAL  Final     Labs: BNP (last 3 results) Recent Labs    06/26/20 0943  BNP >5,188.4*   Basic Metabolic Panel: Recent Labs  Lab 06/30/20 0215 06/30/20 0215 06/30/20 1503 07/01/20 0302 07/02/20 0326 07/03/20 0354 07/04/20 0335  NA 141   < > 141 138 136  135 134*  K 3.6   < > 3.8 3.9 4.4 4.1 4.5  CL 103   < > 104 101 98 95* 95*  CO2 31   < > 25 28 26 26 26   GLUCOSE 116*   < > 142* 99 88 83 91  BUN 28*   < > 37* 46* 66* 36* 62*  CREATININE 5.51*   < > 6.72* 7.84* 9.45* 6.60* 9.11*  CALCIUM 8.0*   < > 8.0* 7.9* 8.0* 7.8* 7.9*  MG 2.1  --   --  2.2 2.3 2.1 2.1  PHOS 3.4   < > 2.8 2.8 3.1 3.7 3.4   < > = values in this interval not displayed.   Liver Function Tests: Recent Labs  Lab 06/27/20 1128 06/28/20 0543 06/30/20 1503 07/01/20 0302 07/02/20 0326 07/03/20 0354 07/04/20 0335  PROT 6.6  --   --   --   --   --   --   ALBUMIN  --    < > 2.9* 2.9* 3.0* 3.0* 2.8*   < > = values in this interval not displayed.   No results for input(s): LIPASE, AMYLASE in the last 168 hours. No results for input(s): AMMONIA in the last 168 hours. CBC: Recent Labs  Lab 06/28/20 0543 06/29/20 0456 07/02/20 0326 07/04/20 0335  WBC 8.0 8.6 8.8 6.8  NEUTROABS  --   --  6.6 4.8  HGB 11.0* 11.7* 12.6* 12.5*  HCT 36.1* 37.4* 39.6 39.3  MCV 92.8 92.8 91.2 89.3  PLT 195 188 166  215   Cardiac Enzymes: No results for input(s): CKTOTAL, CKMB, CKMBINDEX, TROPONINI in the last 168 hours. BNP: Invalid input(s): POCBNP CBG: Recent Labs  Lab 06/29/20 2031  GLUCAP 93   D-Dimer No results for input(s): DDIMER in the last 72 hours. Hgb A1c No results for input(s): HGBA1C in the last 72 hours. Lipid Profile No results for input(s): CHOL, HDL, LDLCALC, TRIG, CHOLHDL, LDLDIRECT in the last 72 hours. Thyroid function studies No results for input(s): TSH, T4TOTAL, T3FREE, THYROIDAB in the last 72 hours.  Invalid input(s): FREET3 Anemia work up No results for input(s): VITAMINB12, FOLATE, FERRITIN, TIBC, IRON, RETICCTPCT in the last 72 hours. Urinalysis    Component Value Date/Time   COLORURINE STRAW (A) 08/27/2018 2129   APPEARANCEUR CLEAR 08/27/2018 2129   LABSPEC 1.011 08/27/2018 2129   PHURINE 6.0 08/27/2018 2129   GLUCOSEU 50 (A) 08/27/2018 2129   HGBUR SMALL (A) 08/27/2018 2129   BILIRUBINUR NEGATIVE 08/27/2018 2129   KETONESUR 5 (A) 08/27/2018 2129   PROTEINUR >=300 (A) 08/27/2018 2129   NITRITE NEGATIVE 08/27/2018 2129   LEUKOCYTESUR NEGATIVE 08/27/2018 2129   Sepsis Labs Invalid input(s): PROCALCITONIN,  WBC,  LACTICIDVEN Microbiology Recent Results (from the past 240 hour(s))  Blood culture (routine x 2)     Status: None   Collection Time: 06/26/20  9:43 AM   Specimen: BLOOD RIGHT FOREARM  Result Value Ref Range Status   Specimen Description BLOOD RIGHT FOREARM  Final   Special Requests   Final    BOTTLES DRAWN AEROBIC AND ANAEROBIC Blood Culture adequate volume   Culture   Final    NO GROWTH 5 DAYS Performed at [facility], [address], [place], [place] [address]    Report Status 07/01/2020 FINAL  Final  Blood culture (routine x 2)     Status: None   Collection Time: 06/26/20  9:48 AM   Specimen: BLOOD  Result Value Ref Range  Status   Specimen Description BLOOD RIGHT ANTECUBITAL  Final   Special Requests   Final    BOTTLES  DRAWN AEROBIC AND ANAEROBIC Blood Culture adequate volume   Culture   Final    NO GROWTH 5 DAYS Performed at [facility], [address], [place], [place] [address]    Report Status 07/01/2020 FINAL  Final  SARS Coronavirus 2 by RT PCR     Status: None   Collection Time: 06/26/20  9:53 AM  Result Value Ref Range Status   SARS Coronavirus 2 NEGATIVE NEGATIVE Final    Comment: (NOTE) Result indicates the ABSENCE of SARS-CoV-2 RNA in the patient specimen.   The lowest concentration of SARS-CoV-2 viral copies this assay can detect in nasopharyngeal swab specimens is 500 copies / mL.  A negative result does not preclude SARS-CoV-2 infection and should not be used as the sole basis for patient management decisions. A negative result may occur with improper specimen collection / handling, submission of a specimen other than nasopharyngeal swab, presence of viral mutation(s) within the areas targeted by this assay, and inadequate number of viral copies (<500 copies / mL) present.  Negative results must be combined with clinical observations, patient history, and epidemiological information.   The expected result is NEGATIVE.   Patient Fact Sheet:  BlogSelections.co.uk    Provider Fact Sheet:  https://lucas.com/    This test is not yet approved or cleared by the [place] FDA and  has been [place] orized for detection and/or diagnosis of SARS-CoV-2 by FDA under an Emergency Use Authorization (EUA).  This EUA will remain in effect (meaning this test can be used) for the duration of  the COVID-19 declaration under Section 564(b)(1) of the Act, 21 U.S.C. section 360bbb-3(b)(1), unless the authorization is terminated or revoked sooner  Performed at [facility], [place] [address] [place], [place] [address]   Body fluid culture     Status: None   Collection Time: 06/26/20 12:31 PM   Specimen: Pleura; Body Fluid  Result Value Ref  Range Status   Specimen Description PLEURAL FLUID RIGHT LUNG  Final   Special Requests NONE  Final   Gram Stain   Final    RARE WBC PRESENT, PREDOMINANTLY MONONUCLEAR NO ORGANISMS SEEN    Culture   Final    NO GROWTH 3 DAYS Performed at [facility], [address], [place], [place] [address]    Report Status 06/29/2020 FINAL  Final     Time coordinating discharge: Over 30 minutes  SIGNED:   Darliss Cheney, MD  [facility] 07/04/2020, 10:39 AM  If 7PM-7AM, please contact night-coverage [email or web address]

## 2020-07-04 NOTE — Progress Notes (Signed)
NAME:  Devin Harmon, MRN:  [id number], DOB:  03-Jan-1956, LOS: 8 ADMISSION DATE:  06/26/2020, CONSULTATION DATE:  06/26/20 REFERRING MD:  Ronnald Nian, CHIEF COMPLAINT:  Shortness of breath  Brief History   64yom presenting with acute respiratory failure 2/2 a large right pleural effusion and pulm edema that is attributable to hypervolemic state in the setting of missed HD.  Underwent large thoracentesis 7/27 with 2,324ml removed with almost completed reacumulation same day. Has received 3 HD treatments since admission and continues to suffer from recurrent pleural effusion.   History of present illness   54 yom with ESRD on HD who presented to [facility] on 06/26/20 for 2d history of progressive shortness of breath. Wife is primary historian due to pt being on BiPAP.    She notes that he missed HD on Friday due to diarrhea. Since that time, he developed progressive shortness of breath and loss of appetite. He was able to make it to HD on day prior to admission, however it is unclear how much fluid was removed. Over the same time period, he additionally developed orthopnea which he does not have at baseline.  Also of note, his wife explains that he recently changed HD centers and has developed worsening LE edema since then. Denies recent fevers, chills or respiratory symptoms other than the shortness of breath.   Past Medical History  ESRD on HD, hypertension  Significant Hospital Events   7/27 admission; thoracentesis 7/30 chest tube placement 8/2 new R sided PTX on CXR.   Consults:  PCCM Nephrology  Procedures:  Right thoracentesis 7/27>>2328mL clear yellow fluid Pigtail chest tube inserted 7/29 with 2L fluid removed  >> 8/2  Significant Diagnostic Tests:  7/27 CXR>> pulm edema with large right pleural effusion. Cardiomegaly. Bibasilar atelectasis 8/2 CXR with new R sided PTX and more lateral positioning of pigtail  Micro Data:  COVID > neg Pleural fluid cultures > Pleural gram stain >  Rare WBC, predominately Mononuclear   Antimicrobials:  n/a  Interim history/subjective:   Chest tube removal 07/02/2020.  Right pneumothoraces no increase in size currently on room air no shortness of breath chest discomfort   Objective   Blood pressure 135/76, pulse (!) 56, temperature 98.2 F (36.8 C), temperature source Oral, resp. rate 18, height 5\' 9"  (1.753 m), weight 67.7 kg, SpO2 99 %.        Intake/Output Summary (Last 24 hours) at 07/04/2020 0918 Last data filed at 07/04/2020 0841 Gross per 24 hour  Intake 240 ml  Output --  Net 240 ml   Filed Weights   07/03/20 0246 07/04/20 0026 07/04/20 0426  Weight: 66.5 kg 67.7 kg 67.7 kg    Examination: Exam is unchanged on 8/3 General: 64 year old male who is older than stated age 46: No JVD or lymphadenopathy is appreciated Neuro: Speech is somewhat slurred.  Right-sided weakness noted CV: Heart sounds are distant PULM: Decreased breath sounds in the bases GI: soft, bsx4 active  GU: Voids Extremities: warm/dry,  edema  Skin: no rashes or lesions    nsc change in rt pnx 8/4   Resolved Hospital Problem list   Acute hypoxic respiratory failure  Pulmonary Edema  Assessment & Plan:   Bilateral pleural effusion, s/p thoracentesis and s/p chest tube placement -high output transudative effusion in setting of acute decompensated systolic heart failure + ESRD with volume overload -Some concern for SVC syndrome given dilated veins in upper chest Right sided pneumothorax -Occurred when patient's chest tube had been malpositioned with  side-port outside the skin.  Improved when pigtail was taped and redressed, may more functional. Now there is residual small R ptx s/p chest tube removal. Hopefully this occurred when tube was removed. No reason to suspect that he should have an airleak or lung injury Plan Follow-up chest x-ray for 2021 shows no significant change in pneumothorax therefore we will continue to monitor and would  not place chest tube at this time. Serial chest x-ray Pulmonary critical care will follow     Best practice:  Diet: Heart healthy  Pain/Anxiety/Delirium protocol (if indicated): PRN VAP protocol (if indicated): N/A DVT prophylaxis: Subq heparin  GI prophylaxis: PPI Glucose control: Monitor  Mobility: Bedrest  Code Status: Full  Family Communication: Per primary Disposition: Floor   Labs   CBC: Recent Labs  Lab 06/28/20 0543 06/29/20 0456 07/02/20 0326 07/04/20 0335  WBC 8.0 8.6 8.8 6.8  NEUTROABS  --   --  6.6 4.8  HGB 11.0* 11.7* 12.6* 12.5*  HCT 36.1* 37.4* 39.6 39.3  MCV 92.8 92.8 91.2 89.3  PLT 195 188 166 099    Basic Metabolic Panel: Recent Labs  Lab 06/30/20 0215 06/30/20 0215 06/30/20 1503 07/01/20 0302 07/02/20 0326 07/03/20 0354 07/04/20 0335  NA 141   < > 141 138 136 135 134*  K 3.6   < > 3.8 3.9 4.4 4.1 4.5  CL 103   < > 104 101 98 95* 95*  CO2 31   < > 25 28 26 26 26   GLUCOSE 116*   < > 142* 99 88 83 91  BUN 28*   < > 37* 46* 66* 36* 62*  CREATININE 5.51*   < > 6.72* 7.84* 9.45* 6.60* 9.11*  CALCIUM 8.0*   < > 8.0* 7.9* 8.0* 7.8* 7.9*  MG 2.1  --   --  2.2 2.3 2.1 2.1  PHOS 3.4   < > 2.8 2.8 3.1 3.7 3.4   < > = values in this interval not displayed.   GFR: Estimated Creatinine Clearance: 7.7 mL/min (A) (by C-G formula based on SCr of 9.11 mg/dL (H)). Recent Labs  Lab 06/28/20 0543 06/29/20 0456 07/02/20 0326 07/04/20 0335  WBC 8.0 8.6 8.8 6.8    Liver Function Tests: Recent Labs  Lab 06/27/20 1128 06/28/20 0543 06/30/20 1503 07/01/20 0302 07/02/20 0326 07/03/20 0354 07/04/20 0335  PROT 6.6  --   --   --   --   --   --   ALBUMIN  --    < > 2.9* 2.9* 3.0* 3.0* 2.8*   < > = values in this interval not displayed.   No results for input(s): LIPASE, AMYLASE in the last 168 hours. No results for input(s): AMMONIA in the last 168 hours.  ABG    Component Value Date/Time   PHART 7.435 06/26/2020 2105   PCO2ART 36.6  06/26/2020 2105   PO2ART 58 (L) 06/26/2020 2105   HCO3 24.5 06/26/2020 2105   TCO2 26 06/26/2020 2105   ACIDBASEDEF 8.4 (H) 04/12/2019 1801   O2SAT 90.0 06/26/2020 2105     Coagulation Profile: No results for input(s): INR, PROTIME in the last 168 hours.  Cardiac Enzymes: No results for input(s): CKTOTAL, CKMB, CKMBINDEX, TROPONINI in the last 168 hours.  HbA1C: No results found for: HGBA1C  CBG: Recent Labs  Lab 06/29/20 2031  GLUCAP 93      Signature:   Richardson Landry Thanh Pomerleau ACNP Acute Care Nurse Practitioner Maryanna Shape Pulmonary/Critical Care Please consult Amion 07/04/2020, 9:18  AM

## 2020-07-04 NOTE — Progress Notes (Signed)
Patient ID: Devin Harmon, male   DOB: June 07, 1956, 64 y.o.   MRN: [id number] S: Pt is very agitated this morning and wants to go home.   O:BP 137/80   Pulse (!) 56   Temp 98.2 F (36.8 C) (Oral)   Resp 18   Ht 5\' 9"  (1.753 m)   Wt 67.7 kg   SpO2 99%   BMI 22.04 kg/m   Intake/Output Summary (Last 24 hours) at 07/04/2020 1024 Last data filed at 07/04/2020 0841 Gross per 24 hour  Intake 240 ml  Output --  Net 240 ml   Intake/Output: I/O last 3 completed shifts: In: 240 [P.O.:240] Out: -   Intake/Output this shift:  Total I/O In: 120 [P.O.:120] Out: -  Weight change: -2.8 kg Gen: NAD but trying to get out of bed CVS: bradycardic, no rub Resp: crepitus at right base, diminished BS at bases Abd: +BS, soft, Nt/nd Ext: no edema, LUE AVF +T/B  Recent Labs  Lab 06/29/20 2225 06/30/20 0215 06/30/20 1503 07/01/20 0302 07/02/20 0326 07/03/20 0354 07/04/20 0335  NA 140 141 141 138 136 135 134*  K 3.6 3.6 3.8 3.9 4.4 4.1 4.5  CL 101 103 104 101 98 95* 95*  CO2 30 31 25 28 26 26 26   GLUCOSE 136* 116* 142* 99 88 83 91  BUN 25* 28* 37* 46* 66* 36* 62*  CREATININE 5.21* 5.51* 6.72* 7.84* 9.45* 6.60* 9.11*  ALBUMIN 2.9* 2.9* 2.9* 2.9* 3.0* 3.0* 2.8*  CALCIUM 8.1* 8.0* 8.0* 7.9* 8.0* 7.8* 7.9*  PHOS 2.6 3.4 2.8 2.8 3.1 3.7 3.4   Liver Function Tests: Recent Labs  Lab 06/27/20 1128 06/28/20 0543 07/02/20 0326 07/03/20 0354 07/04/20 0335  PROT 6.6  --   --   --   --   ALBUMIN  --    < > 3.0* 3.0* 2.8*   < > = values in this interval not displayed.   No results for input(s): LIPASE, AMYLASE in the last 168 hours. No results for input(s): AMMONIA in the last 168 hours. CBC: Recent Labs  Lab 06/28/20 0543 06/28/20 0543 06/29/20 0456 07/02/20 0326 07/04/20 0335  WBC 8.0   < > 8.6 8.8 6.8  NEUTROABS  --   --   --  6.6 4.8  HGB 11.0*   < > 11.7* 12.6* 12.5*  HCT 36.1*   < > 37.4* 39.6 39.3  MCV 92.8  --  92.8 91.2 89.3  PLT 195   < > 188 166 215   < > = values in this  interval not displayed.   Cardiac Enzymes: No results for input(s): CKTOTAL, CKMB, CKMBINDEX, TROPONINI in the last 168 hours. CBG: Recent Labs  Lab 06/29/20 2031  GLUCAP 93    Iron Studies: No results for input(s): IRON, TIBC, TRANSFERRIN, FERRITIN in the last 72 hours. Studies/Results: DG Chest Port 1 View  Result Date: 07/04/2020 CLINICAL DATA:  Pneumothorax EXAM: PORTABLE CHEST 1 VIEW COMPARISON:  July 03, 2020 FINDINGS: The pneumothorax on the right is again noted, essentially stable in size, without tension component. There is extensive subcutaneous air on the right. A small amount of loculated pleural effusion is noted on the right inferolaterally. There is no edema or airspace opacity. There is atelectatic change in the right base. There is cardiomegaly with pulmonary vascularity normal. Central catheter tip is in the superior vena cava. No adenopathy. There is aortic atherosclerosis. No bone lesions. IMPRESSION: Stable right-sided pneumothorax without tension component. Extensive subcutaneous air again noted  on the right. Small amount of loculated effusion inferolateral right base. Mild right base atelectasis. Lungs elsewhere clear. Stable cardiomegaly.  Stable central catheter positioning. Aortic Atherosclerosis (ICD10-I70.0). Electronically Signed   By: Lowella Grip III M.D.   On: 07/04/2020 08:23   DG CHEST PORT 1 VIEW  Result Date: 07/03/2020 CLINICAL DATA:  Chest tube removal. EXAM: PORTABLE CHEST 1 VIEW COMPARISON:  07/02/2020. FINDINGS: Interim removal of right chest tube. Small to moderate pneumothorax noted. Dual-lumen catheter noted stable position. Stable cardiomegaly. Bilateral interstitial prominence noted. Interstitial edema and/or pneumonitis cannot be excluded. Small bilateral pleural effusions. Diffuse right chest wall subcutaneous emphysema. IMPRESSION: 1. Interim removal of right chest tube. Small to moderate right-sided pneumothorax noted. 2.  Dual-lumen catheter  stable position. 3. Cardiomegaly with diffuse bilateral pulmonary interstitial prominence and small bilateral pleural effusions suggesting CHF. Pneumonitis cannot be excluded. Critical Value/emergent results were called by telephone at the time of interpretation on 07/03/2020 at 7:59 am to nurse Jarrett Soho , who verbally acknowledged these results. Electronically Signed   By: Marcello Moores  Register   On: 07/03/2020 08:01   DG CHEST PORT 1 VIEW  Result Date: 07/02/2020 CLINICAL DATA:  Status post right chest tube placement for pneumothorax. EXAM: PORTABLE CHEST 1 VIEW COMPARISON:  Single-view of the chest earlier today. FINDINGS: Pigtail catheter is seen in the inferior aspect of the right chest. Tiny residual pneumothorax of less than 5% is seen. There is subcutaneous emphysema in the right chest wall. The left lung is expanded and clear. Cardiomegaly again seen. Atherosclerosis is also again noted. Dialysis catheter is unchanged. IMPRESSION: Tiny residual right pneumothorax after chest tube placement. No new abnormality. Electronically Signed   By: Inge Rise M.D.   On: 07/02/2020 15:35   . amLODipine  10 mg Oral Daily  . Chlorhexidine Gluconate Cloth  6 each Topical Q0600  . ferric citrate  420 mg Oral TID WC  . heparin  5,000 Units Subcutaneous Q8H  . metoprolol succinate  12.5 mg Oral Daily  . multivitamin  1 tablet Oral Daily  . sodium chloride flush  10 mL Intracatheter Q8H  . sodium chloride flush  3 mL Intravenous Once    BMET    Component Value Date/Time   NA 134 (L) 07/04/2020 0335   K 4.5 07/04/2020 0335   CL 95 (L) 07/04/2020 0335   CO2 26 07/04/2020 0335   GLUCOSE 91 07/04/2020 0335   BUN 62 (H) 07/04/2020 0335   CREATININE 9.11 (H) 07/04/2020 0335   CREATININE 2.04 (H) 06/20/2011 1601   CALCIUM 7.9 (L) 07/04/2020 0335   GFRNONAA 5 (L) 07/04/2020 0335   GFRAA 6 (L) 07/04/2020 0335   CBC    Component Value Date/Time   WBC 6.8 07/04/2020 0335   RBC 4.40 07/04/2020 0335   HGB  12.5 (L) 07/04/2020 0335   HCT 39.3 07/04/2020 0335   PLT 215 07/04/2020 0335   MCV 89.3 07/04/2020 0335   MCH 28.4 07/04/2020 0335   MCHC 31.8 07/04/2020 0335   RDW 18.0 (H) 07/04/2020 0335   LYMPHSABS 1.1 07/04/2020 0335   MONOABS 0.7 07/04/2020 0335   EOSABS 0.2 07/04/2020 0335   BASOSABS 0.0 07/04/2020 0335     Dialysis Orders: MWF- [facility] 4.5 hrs, BFR400, DFR800, EDW 91.5kg(now is 66 kg),2K/2.5Ca  Access:TDC. LU AVF maturing - ready for use starting 06/16/20 Heparin3000 Mircera174mcg q2wks - last 06/20/20 Venofer 100mg  qwk Calcitriol1.49mcg PO qHD Auryxia 2 tab AC TID  Assessment and Plan: 1. Acute hypoxic respiratory failure-  due to noncompliance with HD. Has had aggressive HD with UF as well as thoracentesis. S/p chest tube on 06/28/20. Net negative 23 kg. 2. Pneumothorax- due to chest tube dislodgement.  Resolved with suction and chest tube removed 07/02/20.  CXR with persistent but stable ptx.  Await PCCM input prior to discharge. 3. ESRD- continue with HD on MWF schedule. Significant reduction in EDW. 4. HTN- improved with UF 5. Anemia due to ESRD- no ESA with Hgb >12 6. Right Pleural effusion- transudative, s/p chest tube 06/28/20 and removed 07/02/20. 7. Vascular access- LUE AVF +T/B and can start to use this week 8. CKD-MBD- cont with binders 9. H/o CVA- with right hemiparesis. 10. 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 EF 40-45%, hypokinesis of basal to mid inferior myocardium.  Also grade I DD. 11. Disposition- discharge to home (wife declined SNF placement) but I am very concerned that she will not be able to care for him at home.  His wife is demanding for him to be discharged today and to have HD at his outpatient unit.  Terri Piedra, renal navigator is investigating.   Donetta Potts, MD [employer] [phone]

## 2020-07-04 NOTE — Telephone Encounter (Signed)
Patient's family called in asking if a chest x-ray can be ordered to follow up post hospital visit. Patient's discharging 8/4. Hospital follow up 8/25, due to needing 30. Please advise if this is possible as patient is concerned about getting this done asap.

## 2020-07-04 NOTE — Discharge Instructions (Signed)
Pneumothorax A pneumothorax is commonly called a collapsed lung. It is a condition in which air leaks from a lung and builds up between the thin layer of tissue that covers the lungs (visceral pleura) and the interior wall of the chest cavity (parietal pleura). The air gets trapped outside the lung, between the lung and the chest wall (pleural space). The air takes up space and prevents the lung from fully expanding. This condition sometimes occurs suddenly with no apparent cause. The buildup of air may be small or large. A small pneumothorax may go away on its own. A large pneumothorax will require treatment and hospitalization. What are the causes? This condition may be caused by:  Trauma and injury to the chest wall.  Surgery and other medical procedures.  A complication of an underlying lung problem, especially chronic obstructive pulmonary disease (COPD) or emphysema. Sometimes the cause of this condition is not known. What increases the risk? You are more likely to develop this condition if:  You have an underlying lung problem.  You smoke.  You are 20-40 years old, male, tall, and underweight.  You have a personal or family history of pneumothorax.  You have an eating disorder (anorexia nervosa). This condition can also happen quickly, even in people with no history of lung problems. What are the signs or symptoms? Sometimes a pneumothorax will have no symptoms. When symptoms are present, they can include:  Chest pain.  Shortness of breath.  Increased rate of breathing.  Bluish color to your lips or skin (cyanosis). How is this diagnosed? This condition may be diagnosed by:  A medical history and physical exam.  A chest X-ray, chest CT scan, or ultrasound. How is this treated? Treatment depends on how severe your condition is. The goal of treatment is to remove the extra air and allow your lung to expand back to its normal size.  For a small pneumothorax: ? No  treatment may be needed. ? Extra oxygen is sometimes used to make it go away more quickly.  For a large pneumothorax or a pneumothorax that is causing symptoms, a procedure is done to drain the air from your lungs. To do this, a health care provider may use: ? A needle with a syringe. This is used to suck air from a pleural space where no additional leakage is taking place. ? A chest tube. This is used to suck air where there is ongoing leakage into the pleural space. The chest tube may need to remain in place for several days until the air leak has healed.  In more severe cases, surgery may be needed to repair the damage that is causing the leak.  If you have multiple pneumothorax episodes or have an air leak that will not heal, a procedure called a pleurodesis may be done. A medicine is placed in the pleural space to irritate the tissues around the lung so that the lung will stick to the chest wall, seal any leaks, and stop any buildup of air in that space. If you have an underlying lung problem, severe symptoms, or a large pneumothorax you will usually need to stay in the hospital. Follow these instructions at home: Lifestyle  Do not use any products that contain nicotine or tobacco, such as cigarettes and e-cigarettes. These are major risk factors in pneumothorax. If you need help quitting, ask your health care provider.  Do not lift anything that is heavier than 10 lb (4.5 kg), or the limit that your health care   provider tells you, until he or she says that it is safe.  Avoid activities that take a lot of effort (strenuous) for as long as told by your health care provider.  Return to your normal activities as told by your health care provider. Ask your health care provider what activities are safe for you.  Do not fly in an airplane or scuba dive until your health care provider says it is okay. General instructions  Take over-the-counter and prescription medicines only as told by your  health care provider.  If a cough or pain makes it difficult for you to sleep at night, try sleeping in a semi-upright position in a recliner or by using 2 or 3 pillows.  If you had a chest tube and it was removed, ask your health care provider when you can remove the bandage (dressing). While the dressing is in place, do not allow it to get wet.  Keep all follow-up visits as told by your health care provider. This is important. Contact a health care provider if:  You cough up thick mucus (sputum) that is yellow or green in color.  You were treated with a chest tube, and you have redness, increasing pain, or discharge at the site where it was placed. Get help right away if:  You have increasing chest pain or shortness of breath.  You have a cough that will not go away.  You begin coughing up blood.  You have pain that is getting worse or is not controlled with medicines.  The site where your chest tube was located opens up.  You feel air coming out of the site where the chest tube was placed.  You have a fever or persistent symptoms for more than 2-3 days.  You have a fever and your symptoms suddenly get worse. These symptoms may represent a serious problem that is an emergency. Do not wait to see if the symptoms will go away. Get medical help right away. Call your local emergency services (911 in the [place] Do not drive yourself to the hospital. Summary  A pneumothorax, commonly called a collapsed lung, is a condition in which air leaks from a lung and gets trapped between the lung and the chest wall (pleural space).  The buildup of air may be small or large. A small pneumothorax may go away on its own. A large pneumothorax will require treatment and hospitalization.  Treatment for this condition depends on how severe the pneumothorax is. The goal of treatment is to remove the extra air and allow the lung to expand back to its normal size. This information is not intended to  replace advice given to you by your health care provider. Make sure you discuss any questions you have with your health care provider. Document Revised: 10/30/2017 Document Reviewed: 10/26/2017 Elsevier Patient Education  2020 [employer] Inc[employer]  

## 2020-07-05 ENCOUNTER — Telehealth: Payer: Self-pay | Admitting: Nephrology

## 2020-07-05 NOTE — Telephone Encounter (Signed)
Transition of Care Contact from [place]  Date of Discharge: 07/04/20 Date of Contact: 07/05/20 Method of contact: phone - attempted  Attempted to contact patient to discuss transition of care from inpatient admission.  Patient did not answer the phone.  Message was left on patient's voicemail informing them we would attempt to call them again and if unable to reach will follow up at dialysis.  Jen Mow, PA-C [facility] Pager: [phone]

## 2020-07-05 NOTE — Telephone Encounter (Signed)
Okay 

## 2020-07-05 NOTE — Telephone Encounter (Signed)
I left a message on Devin Harmon's voice mail to return my call.

## 2020-07-05 NOTE — Telephone Encounter (Signed)
I spoke to [place] and she scheduled appointment on 07/10/20 at 12:00.

## 2020-07-06 ENCOUNTER — Telehealth: Payer: Self-pay | Admitting: Nephrology

## 2020-07-06 NOTE — Telephone Encounter (Signed)
Transition of Care Contact from [place] [facility] [place]  Date of Discharge: 07/04/20 Date of Contact: 07/06/20 Method of contact: phone - attempted  Attempted to contact patient to discuss transition of care from inpatient admission.  Patient did not answer the phone.  Message was left on patient's voicemail informing them we would attempt to call them again and if unable to reach will follow up at dialysis.  Jen Mow, PA-C [facility] Pager: [phone]

## 2020-07-10 ENCOUNTER — Other Ambulatory Visit: Payer: Self-pay

## 2020-07-10 ENCOUNTER — Ambulatory Visit (INDEPENDENT_AMBULATORY_CARE_PROVIDER_SITE_OTHER): Payer: Medicare Other | Admitting: Internal Medicine

## 2020-07-10 ENCOUNTER — Encounter: Payer: Self-pay | Admitting: Internal Medicine

## 2020-07-10 ENCOUNTER — Ambulatory Visit (INDEPENDENT_AMBULATORY_CARE_PROVIDER_SITE_OTHER)
Admission: RE | Admit: 2020-07-10 | Discharge: 2020-07-10 | Disposition: A | Discharge status: Home / Self Care | Payer: Medicare Other | Source: Ambulatory Visit | Attending: Internal Medicine | Admitting: Internal Medicine

## 2020-07-10 ENCOUNTER — Telehealth: Payer: Self-pay

## 2020-07-10 VITALS — BP 126/84 | HR 55 | Temp 98.2°F | Ht 69.0 in | Wt 153.0 lb

## 2020-07-10 DIAGNOSIS — J90 Pleural effusion, not elsewhere classified: Secondary | ICD-10-CM

## 2020-07-10 DIAGNOSIS — N186 End stage renal disease: Secondary | ICD-10-CM | POA: Diagnosis not present

## 2020-07-10 DIAGNOSIS — I5022 Chronic systolic (congestive) heart failure: Secondary | ICD-10-CM | POA: Diagnosis not present

## 2020-07-10 LAB — RENAL FUNCTION PANEL
Albumin: 3.8 g/dL (ref 3.5–5.2)
BUN: 71 mg/dL — ABNORMAL HIGH (ref 6–23)
CO2: 27 mEq/L (ref 19–32)
Calcium: 9 mg/dL (ref 8.4–10.5)
Chloride: 99 mEq/L (ref 96–112)
Creatinine, Ser: 11.18 mg/dL (ref 0.40–1.50)
GFR: 5.59 mL/min — CL (ref >60.00)
Glucose, Bld: 84 mg/dL (ref 70–99)
Phosphorus: 5.5 mg/dL — ABNORMAL HIGH (ref 2.3–4.6)
Potassium: 5 mEq/L (ref 3.5–5.1)
Sodium: 140 mEq/L (ref 135–145)

## 2020-07-10 LAB — CBC
HCT: 39.3 % (ref 39.0–52.0)
Hemoglobin: 12.8 g/dL — ABNORMAL LOW (ref 13.0–17.0)
MCHC: 32.6 g/dL (ref 30.0–36.0)
MCV: 90.4 fl (ref 78.0–100.0)
Platelets: 304 10*3/uL (ref 150.0–400.0)
RBC: 4.35 Mil/uL (ref 4.22–5.81)
RDW: 18.9 % — ABNORMAL HIGH (ref 11.5–15.5)
WBC: 8.1 10*3/uL (ref 4.0–10.5)

## 2020-07-10 NOTE — Assessment & Plan Note (Signed)
Back on dialysis schedule now Weight down 40+# from highest

## 2020-07-10 NOTE — Assessment & Plan Note (Signed)
New, mildly reduced EF Likely non ischemic but can't be sure He is supposed to see cardiology but I am not excited about ischemic work up for him Beta blocker changed to toprol---low dose

## 2020-07-10 NOTE — Telephone Encounter (Signed)
Verbally advised Webb Silversmith, NP of lab results

## 2020-07-10 NOTE — Progress Notes (Signed)
Subjective:    Patient ID: Devin Harmon, male    DOB: [birth year], 64 y.o.   MRN: [id number]  HPI Here with wife for hospital follow up This visit occurred during the SARS-CoV-2 public health emergency.  Safety protocols were in place, including screening questions prior to the visit, additional usage of staff PPE, and extensive cleaning of exam room while observing appropriate contact time as indicated for disinfecting solutions.   Was admitted in respiratory failure Was fluid overloaded Had right side thoracentesis but then it reaccumulated Had chest tube for a while---then finally removed Was dialyzed thoroughly while in the hospital  Did go away for vacation---[place] [facility] for 4 days Enjoyed that [place] back to dialysis since then  Echo showed some LV hypokinesis and EF 40-45% Down from 55-60% 1 year ago No chest pain No SOB now Sleeping much better now--flat in bed. No PND No edema now   Current Outpatient Medications on File Prior to Visit  Medication Sig Dispense Refill  . amLODipine (NORVASC) 10 MG tablet TAKE 1 TABLET BY MOUTH EVERY DAY (Patient taking differently: Take 10 mg by mouth daily. ) 90 tablet 3  . AURYXIA 1 GM 210 MG(Fe) tablet Take 420 mg by mouth 3 (three) times daily with meals.     . B Complex-C-Folic Acid (RENA-VITE RX) 1 MG TABS Take 1 mg by mouth daily.    . cetirizine (ZYRTEC) 10 MG tablet Take 10 mg by mouth daily as needed for allergies.     . fluticasone (FLONASE) 50 MCG/ACT nasal spray Place 1-2 sprays into both nostrils daily as needed for allergies.     . hydrocortisone cream 1 % Apply 1 application topically daily as needed (rash).    . labetalol (NORMODYNE) 100 MG tablet Take 100 mg by mouth 2 (two) times daily.    Marland Kitchen neomycin-bacitracin-polymyxin (NEOSPORIN) ointment Apply 1 application topically daily as needed for wound care.      No current facility-administered medications on file prior to visit.    Allergies  Allergen Reactions  .  Sulfa Antibiotics Hives  . Sulfonamide Derivatives Hives  . Chlorthalidone Other (See Comments)    felt "bad" and ED   . Hydrocodone Nausea Only    dizzy    Past Medical History:  Diagnosis Date  . Allergy   . Anemia   . BPH (benign prostatic hypertrophy)   . ESRD (end stage renal disease) ([place])    MWF - [place]  . Hypertension   . Leg weakness    right  . Slurred speech 02/07/2020   ? cva per Dr Silvio Pate, and cognitive changes  . Zoster 2007   facial, hospital 2007    Past Surgical History:  Procedure Laterality Date  . A/V FISTULAGRAM N/A 12/19/2019   Procedure: A/V FISTULAGRAM - Right Arm;  Surgeon: Waynetta Sandy, MD;  Location: [facility];  Service: Cardiovascular;  Laterality: N/A;  . A/V FISTULAGRAM Left 03/15/2020   Procedure: A/V FISTULAGRAM;  Surgeon: Marty Heck, MD;  Location: [facility] [place] [facility];  Service: Cardiovascular;  Laterality: Left;  . AV FISTULA PLACEMENT Right 09/01/2019   Procedure: ARTERIOVENOUS (AV) FISTULA CREATION RIGHT ARM;  Surgeon: Marty Heck, MD;  Location: [facility];  Service: Vascular;  Laterality: Right;  . AV FISTULA PLACEMENT Left 02/09/2020   Procedure: LEFT ARM BASILIC VEIN Arteriovenous FISTULA  CREATION;  Surgeon: Marty Heck, MD;  Location: [facility];  Service: Vascular;  Laterality: Left;  . [place]  TRANSPOSITION Left 05/17/2020   Procedure: LEFT ARM SECOND STAGE BASCILIC FISTULA;  Surgeon: Marty Heck, MD;  Location: [place] [facility];  Service: Vascular;  Laterality: Left;  . DIALYSIS/PERMA CATHETER INSERTION N/A 04/15/2019   Procedure: DIALYSIS/PERMA CATHETER INSERTION;  Surgeon: Katha Cabal, MD;  Location: [facility];  Service: Cardiovascular;  Laterality: N/A;  . INSERTION OF DIALYSIS CATHETER Left 11/15/2019   Procedure: INSERTION OF LEFT TUNNEL DIALYSIS CATHETER;  Surgeon: Waynetta Sandy, MD;  Location: [facility];  Service: Vascular;  Laterality: Left;  . LIGATION OF  ARTERIOVENOUS  FISTULA Right 01/03/2020   Procedure: LIGATION OF ARTERIOVENOUS  FISTULA;  Surgeon: Serafina Mitchell, MD;  Location: [facility];  Service: Vascular;  Laterality: Right;  . PERIPHERAL VASCULAR BALLOON ANGIOPLASTY Left 03/15/2020   Procedure: PERIPHERAL VASCULAR BALLOON ANGIOPLASTY;  Surgeon: Marty Heck, MD;  Location: [facility];  Service: Cardiovascular;  Laterality: Left;  UPPER ARM  . PERIPHERAL VASCULAR INTERVENTION Right 12/19/2019   Procedure: PERIPHERAL VASCULAR INTERVENTION;  Surgeon: Waynetta Sandy, MD;  Location: [place];  Service: Cardiovascular;  Laterality: Right;  . REMOVAL OF A DIALYSIS CATHETER Right 11/15/2019   Procedure: REMOVAL OF A RIGHT TUNNEL DIALYSIS CATHETER;  Surgeon: Waynetta Sandy, MD;  Location: [facility];  Service: Vascular;  Laterality: Right;    Family History  Problem Relation Age of Onset  . Hypertension Mother   . Cancer Mother        lung cancer  . Hypertension Brother   . Heart disease Father   . Coronary artery disease Neg Hx   . Diabetes Neg Hx     Social History   Socioeconomic History  . Marital status: Married    Spouse name: Not on file  . Number of children: 2  . Years of education: Not on file  . Highest education level: Not on file  Occupational History  . Occupation: Museum/gallery curator, building new homes  Tobacco Use  . Smoking status: Never Smoker  . Smokeless tobacco: Never Used  Vaping Use  . Vaping Use: Never used  Substance and Sexual Activity  . Alcohol use: Not Currently    Comment: occasional but none since 2019  . Drug use: Not Currently  . Sexual activity: Not on file  Other Topics Concern  . Not on file  Social History Narrative  . Not on file   Social Determinants of Health   Financial Resource Strain:   . Difficulty of Paying Living Expenses:   Food Insecurity:   . Worried About Charity fundraiser in the Last Year:   . Arboriculturist in the Last Year:     Transportation Needs:   . Film/video editor (Medical):   Marland Kitchen Lack of Transportation (Non-Medical):   Physical Activity:   . Days of Exercise per Week:   . Minutes of Exercise per Session:   Stress:   . Feeling of Stress :   Social Connections:   . Frequency of Communication with Friends and Family:   . Frequency of Social Gatherings with Friends and Family:   . Attends Religious Services:   . Active Member of Clubs or Organizations:   . Attends Archivist Meetings:   Marland Kitchen Marital Status:   Intimate Partner Violence:   . Fear of Current or Ex-Partner:   . Emotionally Abused:   Marland Kitchen Physically Abused:   . Sexually Abused:    Review of Systems Still using catheter due to  fistula problems Left arm fistula finally mature---not using it yet    Objective:   Physical Exam Cardiovascular:     Rate and Rhythm: Normal rate and regular rhythm.     Heart sounds: No murmur heard.  No gallop.   Pulmonary:     Effort: Pulmonary effort is normal.     Breath sounds: No wheezing or rales.     Comments: Decreased breath sounds on right and increased dullness to percussion Musculoskeletal:     Right lower leg: No edema.     Left lower leg: No edema.  Lymphadenopathy:     Cervical: No cervical adenopathy.  Neurological:     Comments: [place] [employer] speaking--per his usual now            Assessment & Plan:

## 2020-07-10 NOTE — Telephone Encounter (Signed)
Known ESRD 

## 2020-07-10 NOTE — Assessment & Plan Note (Addendum)
Seems to have reaccumulated by exam Will check CXR  Effusion hasn't reaccumulated Small pneumo No action needed at this point

## 2020-07-10 NOTE — Telephone Encounter (Signed)
[facility] called critical results @ 1609  Creatinine 11.18 GFR 5.59

## 2020-07-11 ENCOUNTER — Ambulatory Visit: Payer: Medicare Other | Admitting: General Practice

## 2020-07-25 ENCOUNTER — Inpatient Hospital Stay: Payer: Medicare Other | Admitting: Internal Medicine

## 2020-08-07 ENCOUNTER — Ambulatory Visit (INDEPENDENT_AMBULATORY_CARE_PROVIDER_SITE_OTHER): Payer: Medicare Other | Admitting: Internal Medicine

## 2020-08-07 ENCOUNTER — Other Ambulatory Visit: Payer: Self-pay

## 2020-08-07 ENCOUNTER — Encounter: Payer: Self-pay | Admitting: Internal Medicine

## 2020-08-07 DIAGNOSIS — I5022 Chronic systolic (congestive) heart failure: Secondary | ICD-10-CM | POA: Diagnosis not present

## 2020-08-07 IMAGING — DX DG CHEST 1V PORT
1 series · 1 of 1 positions shown · non-contrast
Comparison: Radiograph earlier this day.

CLINICAL DATA: Dyspnea.

EXAM:
PORTABLE CHEST 1 VIEW

[chest ap]
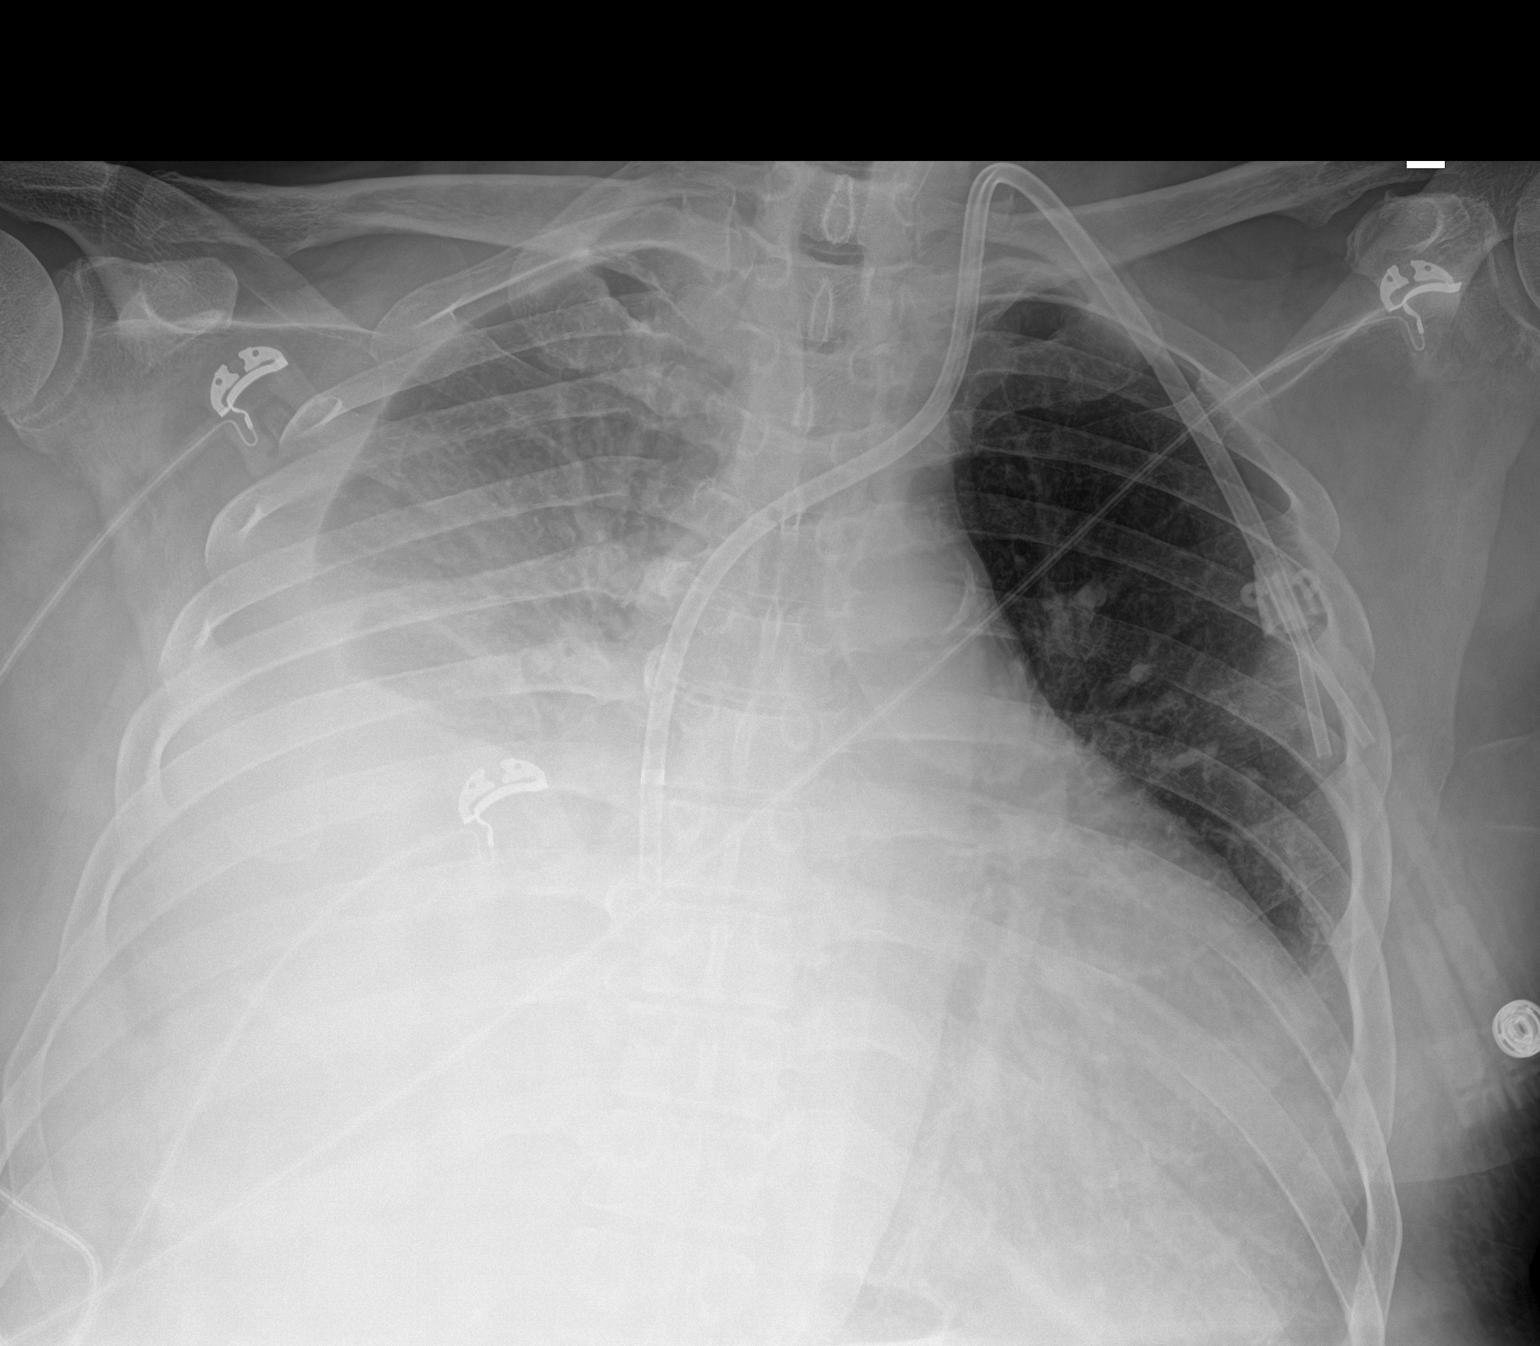

[1 of 1 positions shown; findings below may reference images not displayed]

FINDINGS: Progressive right pleural effusion since exam earlier this day, now
occupying the lower [DATE] of right hemithorax. Increasing opacity
throughout the right hemithorax likely due to layering effusion and
atelectasis. Left-sided dialysis catheter remains in place.
Cardiomegaly is stable. Left costophrenic angle is not included in
the field of view limiting assessment for left pleural effusion.
There is no visualized pneumothorax.
IMPRESSION: 1. Progressive right pleural effusion since exam earlier this day,
now occupying the lower [DATE] of right hemithorax.
2. Increasing opacity throughout the right hemithorax likely due to
layering effusion and atelectasis.
3. Stable cardiomegaly. Previous left lung base opacity not well
assessed on the current exam given positioning.

## 2020-08-07 IMAGING — DX DG CHEST 1V PORT
1 series · 1 of 1 positions shown · non-contrast
Comparison: June 26, 2020, prior to thoracentesis

CLINICAL DATA: Post thoracentesis

EXAM:
PORTABLE CHEST 1 VIEW

[chest]
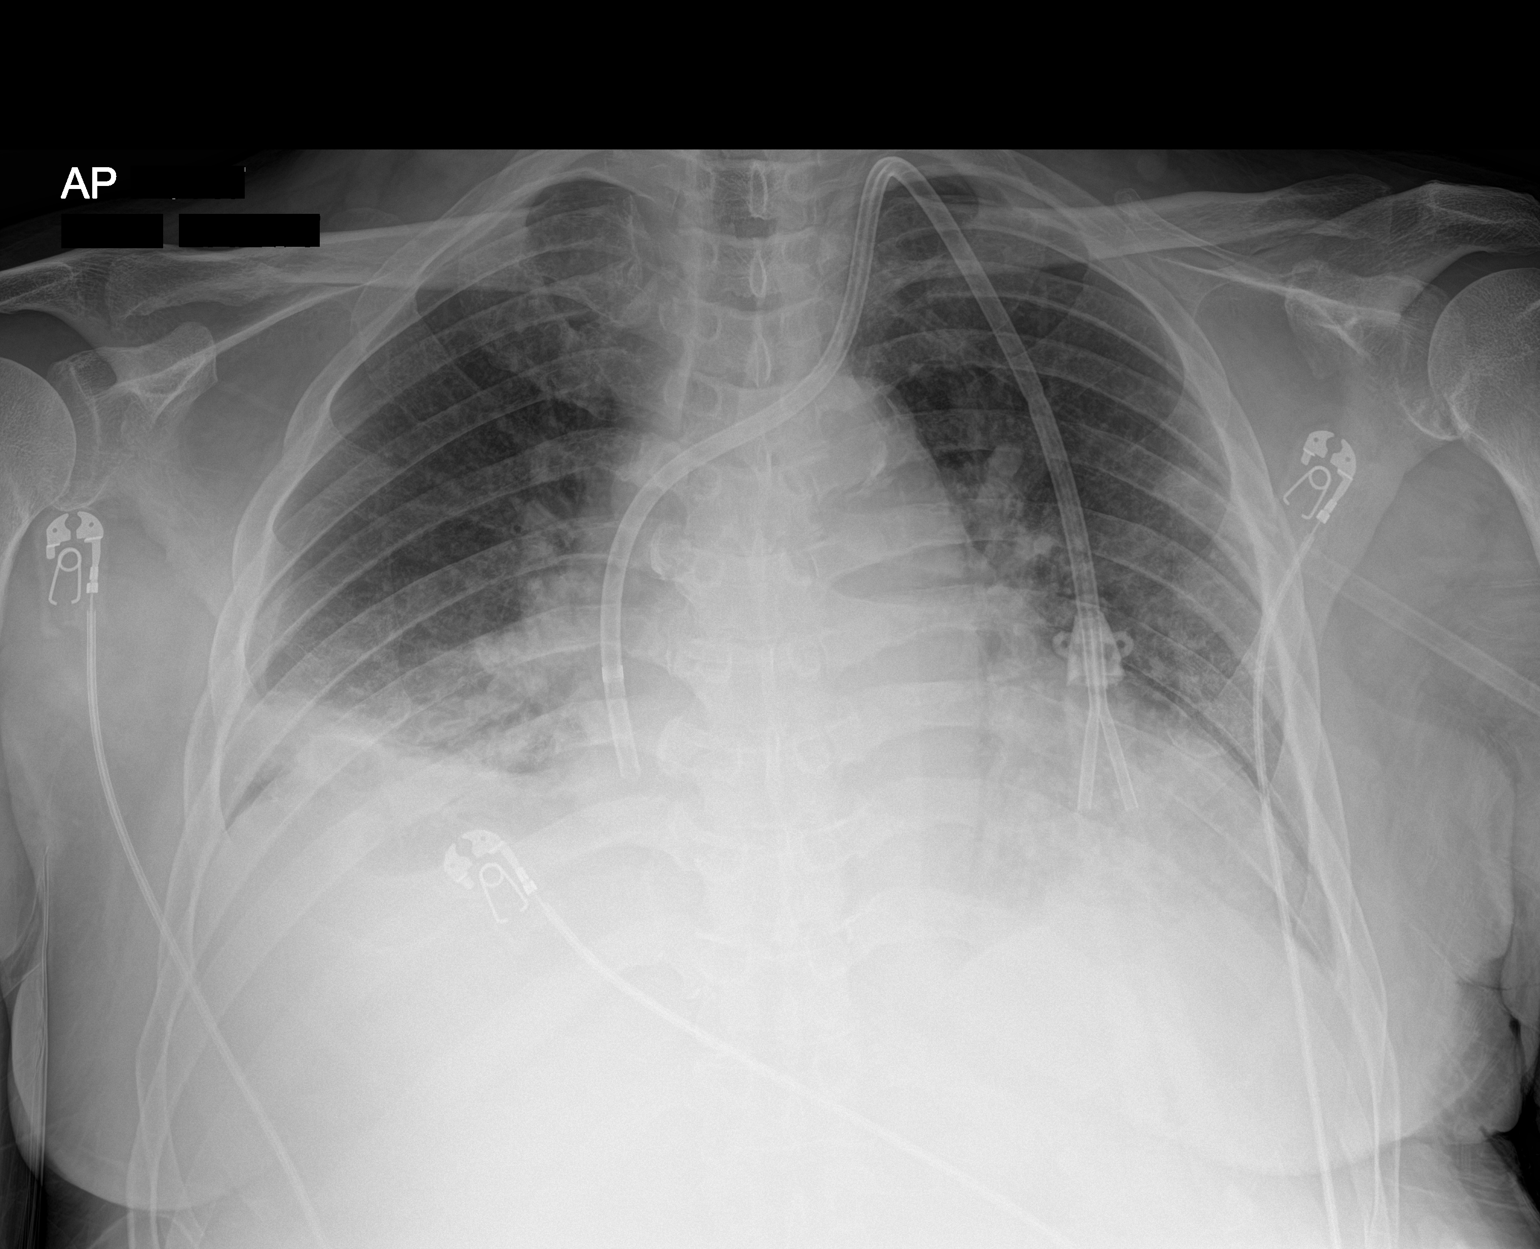

[1 of 1 positions shown; findings below may reference images not displayed]

FINDINGS: LEFT-sided vascular access device terminates at the caval to atrial
junction.

Marked decrease in size of a RIGHT-sided pleural effusion since the
previous evaluation. No pneumothorax. Persistent RIGHT basilar
airspace disease.

Vascular congestion.

Cardiomediastinal contours accentuated by low lung volumes,
unchanged from the previous exam.

On limited assessment visualized skeletal structures without acute
bone finding.
IMPRESSION: 1. Marked decrease in size of a RIGHT-sided pleural effusion
following thoracentesis. No pneumothorax is evident.
2. Persistent RIGHT basilar airspace disease.
3. Subtle LEFT basilar opacities likely atelectasis with diminished
lung volume since the comparison evaluation.

## 2020-08-07 NOTE — Assessment & Plan Note (Addendum)
Compensated now Weight up some--but doing okay Monitoring with dialysis On the beta blocker still No indication to rush into ischemic work up at this point

## 2020-08-07 NOTE — Progress Notes (Signed)
Subjective:    Patient ID: Devin Harmon, male    DOB: 19-Feb-1956, 64 y.o.   MRN: [id number]  HPI Here for follow up of new CHF diagnosis This visit occurred during the SARS-CoV-2 public health emergency.  Safety protocols were in place, including screening questions prior to the visit, additional usage of staff PPE, and extensive cleaning of exam room while observing appropriate contact time as indicated for disinfecting solutions.   Has been doing better with dialysis Hasn't missed any days--but gets anxious and often doesn't complete the full 4 hours (often 3.5 hours) Has been weighing at dialysis--and wife checks Now up 5+# since discharge--but may be stable at this  Breathing is okay Not having mucus like before Sleeping in bed--- no PND No edema  Wife hasn't made the cardiology appt as yet Trouble with scheduling due to dialysis, etc  Current Outpatient Medications on File Prior to Visit  Medication Sig Dispense Refill   amLODipine (NORVASC) 10 MG tablet TAKE 1 TABLET BY MOUTH EVERY DAY (Patient taking differently: Take 10 mg by mouth daily. ) 90 tablet 3   AURYXIA 1 GM 210 MG(Fe) tablet Take 420 mg by mouth 3 (three) times daily with meals.      B Complex-C-Folic Acid (RENA-VITE RX) 1 MG TABS Take 1 mg by mouth daily.     cetirizine (ZYRTEC) 10 MG tablet Take 10 mg by mouth daily as needed for allergies.      fluticasone (FLONASE) 50 MCG/ACT nasal spray Place 1-2 sprays into both nostrils daily as needed for allergies.      hydrocortisone cream 1 % Apply 1 application topically daily as needed (rash).     metoprolol succinate (TOPROL-XL) 25 MG 24 hr tablet Take 12.5 mg by mouth daily.     neomycin-bacitracin-polymyxin (NEOSPORIN) ointment Apply 1 application topically daily as needed for wound care.      No current facility-administered medications on file prior to visit.    Allergies  Allergen Reactions   Sulfa Antibiotics Hives   Sulfonamide Derivatives  Hives   Chlorthalidone Other (See Comments)    felt "bad" and ED    Hydrocodone Nausea Only    dizzy    Past Medical History:  Diagnosis Date   Allergy    Anemia    BPH (benign prostatic hypertrophy)    ESRD (end stage renal disease) ([place])    MWF - [place]   Hypertension    Leg weakness    right   Slurred speech 02/07/2020   ? cva per Dr Silvio Pate, and cognitive changes   Zoster 2007   facial, hospital 2007    Past Surgical History:  Procedure Laterality Date   A/V FISTULAGRAM N/A 12/19/2019   Procedure: A/V FISTULAGRAM - Right Arm;  Surgeon: Waynetta Sandy, MD;  Location: [place] [facility];  Service: Cardiovascular;  Laterality: N/A;   A/V FISTULAGRAM Left 03/15/2020   Procedure: A/V FISTULAGRAM;  Surgeon: Marty Heck, MD;  Location: [place] [facility];  Service: Cardiovascular;  Laterality: Left;   AV FISTULA PLACEMENT Right 09/01/2019   Procedure: ARTERIOVENOUS (AV) FISTULA CREATION RIGHT ARM;  Surgeon: Marty Heck, MD;  Location: [place];  Service: Vascular;  Laterality: Right;   AV FISTULA PLACEMENT Left 02/09/2020   Procedure: LEFT ARM BASILIC VEIN Arteriovenous FISTULA  CREATION;  Surgeon: Marty Heck, MD;  Location: [place];  Service: Vascular;  Laterality: Left;   [place] Left 05/17/2020   Procedure: LEFT ARM SECOND  STAGE BASCILIC FISTULA;  Surgeon: Marty Heck, MD;  Location: [place];  Service: Vascular;  Laterality: Left;   DIALYSIS/PERMA CATHETER INSERTION N/A 04/15/2019   Procedure: DIALYSIS/PERMA CATHETER INSERTION;  Surgeon: Katha Cabal, MD;  Location: [place] [facility];  Service: Cardiovascular;  Laterality: N/A;   INSERTION OF DIALYSIS CATHETER Left 11/15/2019   Procedure: INSERTION OF LEFT TUNNEL DIALYSIS CATHETER;  Surgeon: Waynetta Sandy, MD;  Location: [place];  Service: Vascular;  Laterality: Left;   LIGATION OF ARTERIOVENOUS  FISTULA Right 01/03/2020   Procedure:  LIGATION OF ARTERIOVENOUS  FISTULA;  Surgeon: Serafina Mitchell, MD;  Location[facility];  Service: Vascular;  Laterality: Right;   PERIPHERAL VASCULAR BALLOON ANGIOPLASTY Left 03/15/2020   Procedure: PERIPHERAL VASCULAR BALLOON ANGIOPLASTY;  Surgeon: Marty Heck, MD;  Location: [place] [facility];  Service: Cardiovascular;  Laterality: Left;  UPPER ARM   PERIPHERAL VASCULAR INTERVENTION Right 12/19/2019   Procedure: PERIPHERAL VASCULAR INTERVENTION;  Surgeon: Waynetta Sandy, MD;  Location: [place] [facility];  Service: Cardiovascular;  Laterality: Right;   REMOVAL OF A DIALYSIS CATHETER Right 11/15/2019   Procedure: REMOVAL OF A RIGHT TUNNEL DIALYSIS CATHETER;  Surgeon: Waynetta Sandy, MD;  Location: [place];  Service: Vascular;  Laterality: Right;    Family History  Problem Relation Age of Onset   Hypertension Mother    Cancer Mother        lung cancer   Hypertension Brother    Heart disease Father    Coronary artery disease Neg Hx    Diabetes Neg Hx     Social History   Socioeconomic History   Marital status: Married    Spouse name: Not on file   Number of children: 2   Years of education: Not on file   Highest education level: Not on file  Occupational History   Occupation: Museum/gallery curator, building new homes  Tobacco Use   Smoking status: Never Smoker   Smokeless tobacco: Never Used  Scientific laboratory technician Use: Never used  Substance and Sexual Activity   Alcohol use: Not Currently    Comment: occasional but none since 2019   Drug use: Not Currently   Sexual activity: Not on file  Other Topics Concern   Not on file  Social History Narrative   Not on file   Social Determinants of Health   Financial Resource Strain:    Difficulty of Paying Living Expenses: Not on file  Food Insecurity:    Worried About Charity fundraiser in the Last Year: Not on file   [place] in the Last Year: Not on file  Transportation Needs:     Lack of Transportation (Medical): Not on file   Lack of Transportation (Non-Medical): Not on file  Physical Activity:    Days of Exercise per Week: Not on file   Minutes of Exercise per Session: Not on file  Stress:    Feeling of Stress : Not on file  Social Connections:    Frequency of Communication with Friends and Family: Not on file   Frequency of Social Gatherings with Friends and Family: Not on file   Attends Religious Services: Not on file   Active Member of Clubs or Organizations: Not on file   Attends Archivist Meetings: Not on file   Marital Status: Not on file  Intimate Partner Violence:    Fear of Current or Ex-Partner: Not on file   Emotionally Abused: Not  on file   Physically Abused: Not on file   Sexually Abused: Not on file   Review of Systems Appetite is okay No palpitations    Objective:   Physical Exam Constitutional:      Appearance: Normal appearance.  Cardiovascular:     Rate and Rhythm: Normal rate and regular rhythm.     Heart sounds: No murmur heard.  No gallop.   Pulmonary:     Effort: Pulmonary effort is normal.     Breath sounds: Normal breath sounds. No wheezing or rales.     Comments: No dullness Musculoskeletal:     Comments: Tense 1+ calf edema (above ankles)  Neurological:     Mental Status: He is alert.  Psychiatric:     Comments: Usual limited speech--but actually more verbal today than many times            Assessment & Plan:

## 2020-08-13 IMAGING — DX DG CHEST 1V PORT
1 series · 1 of 1 positions shown · non-contrast
Comparison: Single-view of the chest earlier today.

CLINICAL DATA: Status post right chest tube placement for
pneumothorax.

EXAM:
PORTABLE CHEST 1 VIEW

[chest]
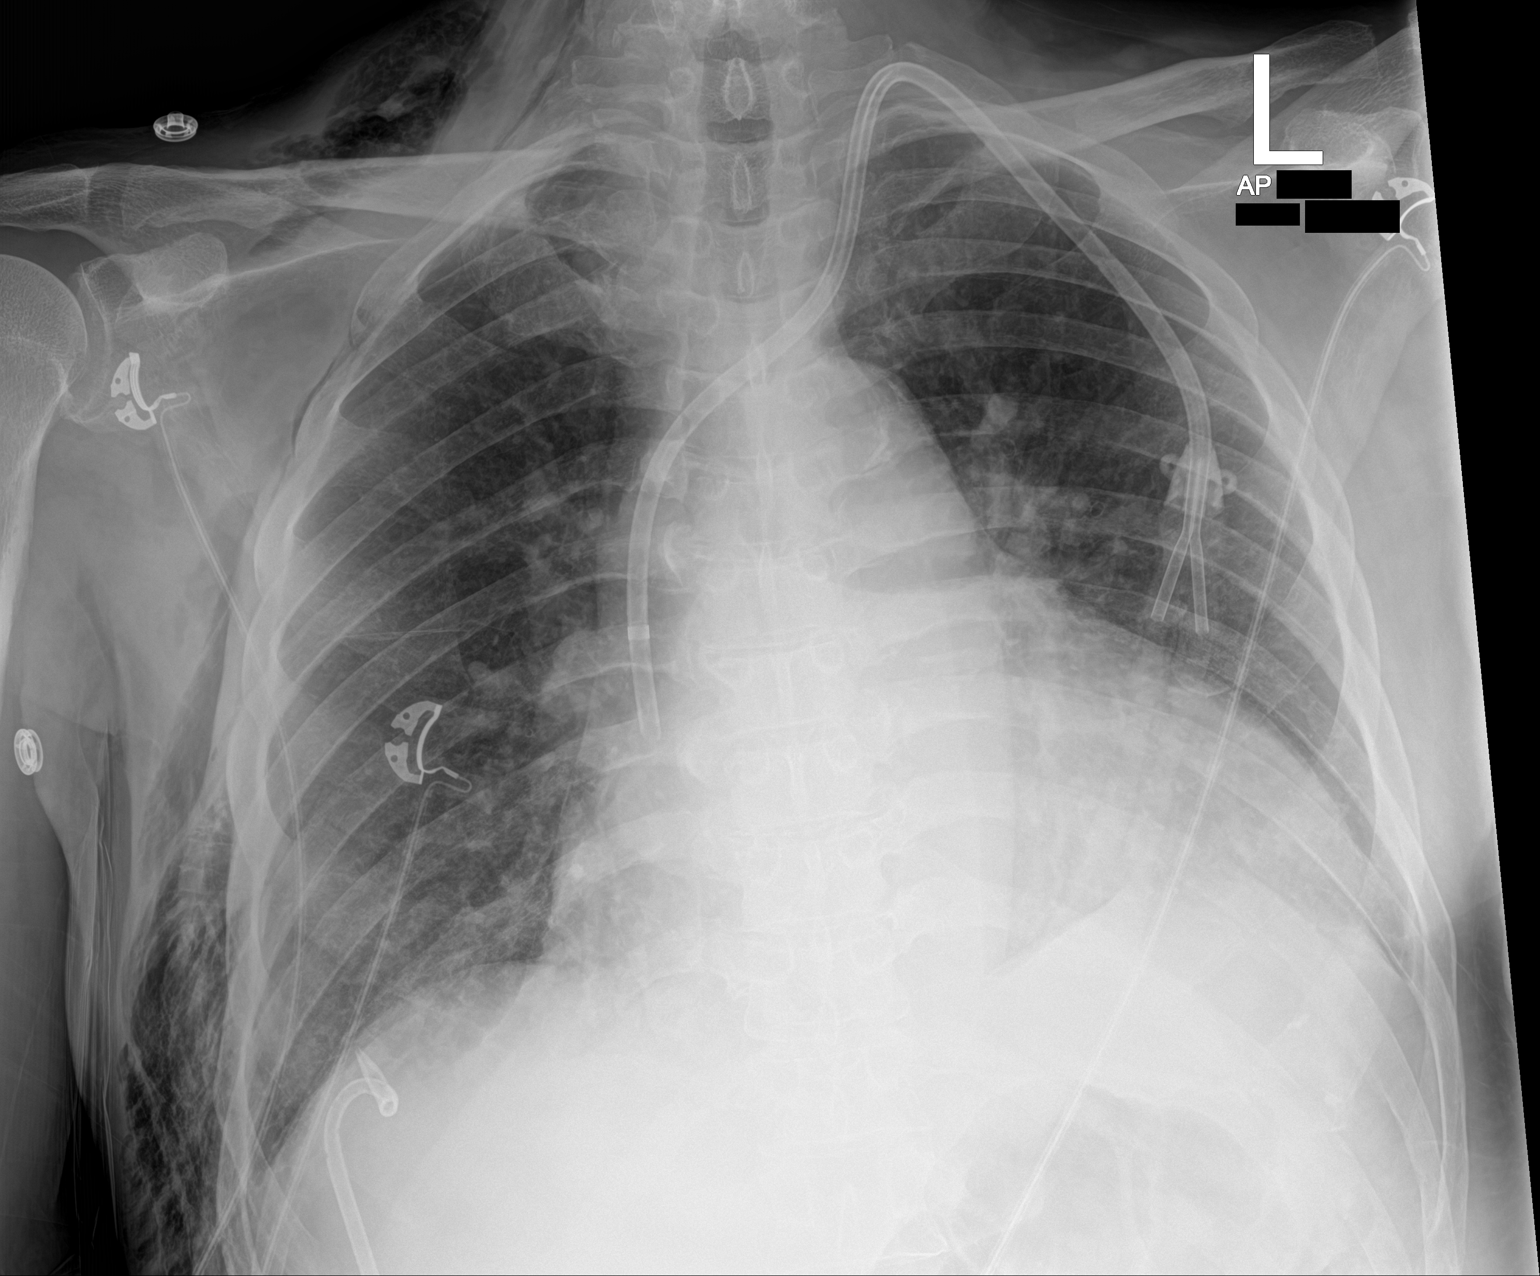

[1 of 1 positions shown; findings below may reference images not displayed]

FINDINGS: Pigtail catheter is seen in the inferior aspect of the right chest.
Tiny residual pneumothorax of less than 5% is seen. There is
subcutaneous emphysema in the right chest wall. The left lung is
expanded and clear. Cardiomegaly again seen. Atherosclerosis is also
again noted. Dialysis catheter is unchanged.
IMPRESSION: Tiny residual right pneumothorax after chest tube placement. No new
abnormality.

## 2020-08-15 IMAGING — DX DG CHEST 1V PORT
1 series · 1 of 1 positions shown · non-contrast
Comparison: July 03, 2020

CLINICAL DATA: Pneumothorax

EXAM:
PORTABLE CHEST 1 VIEW

[chest ap]
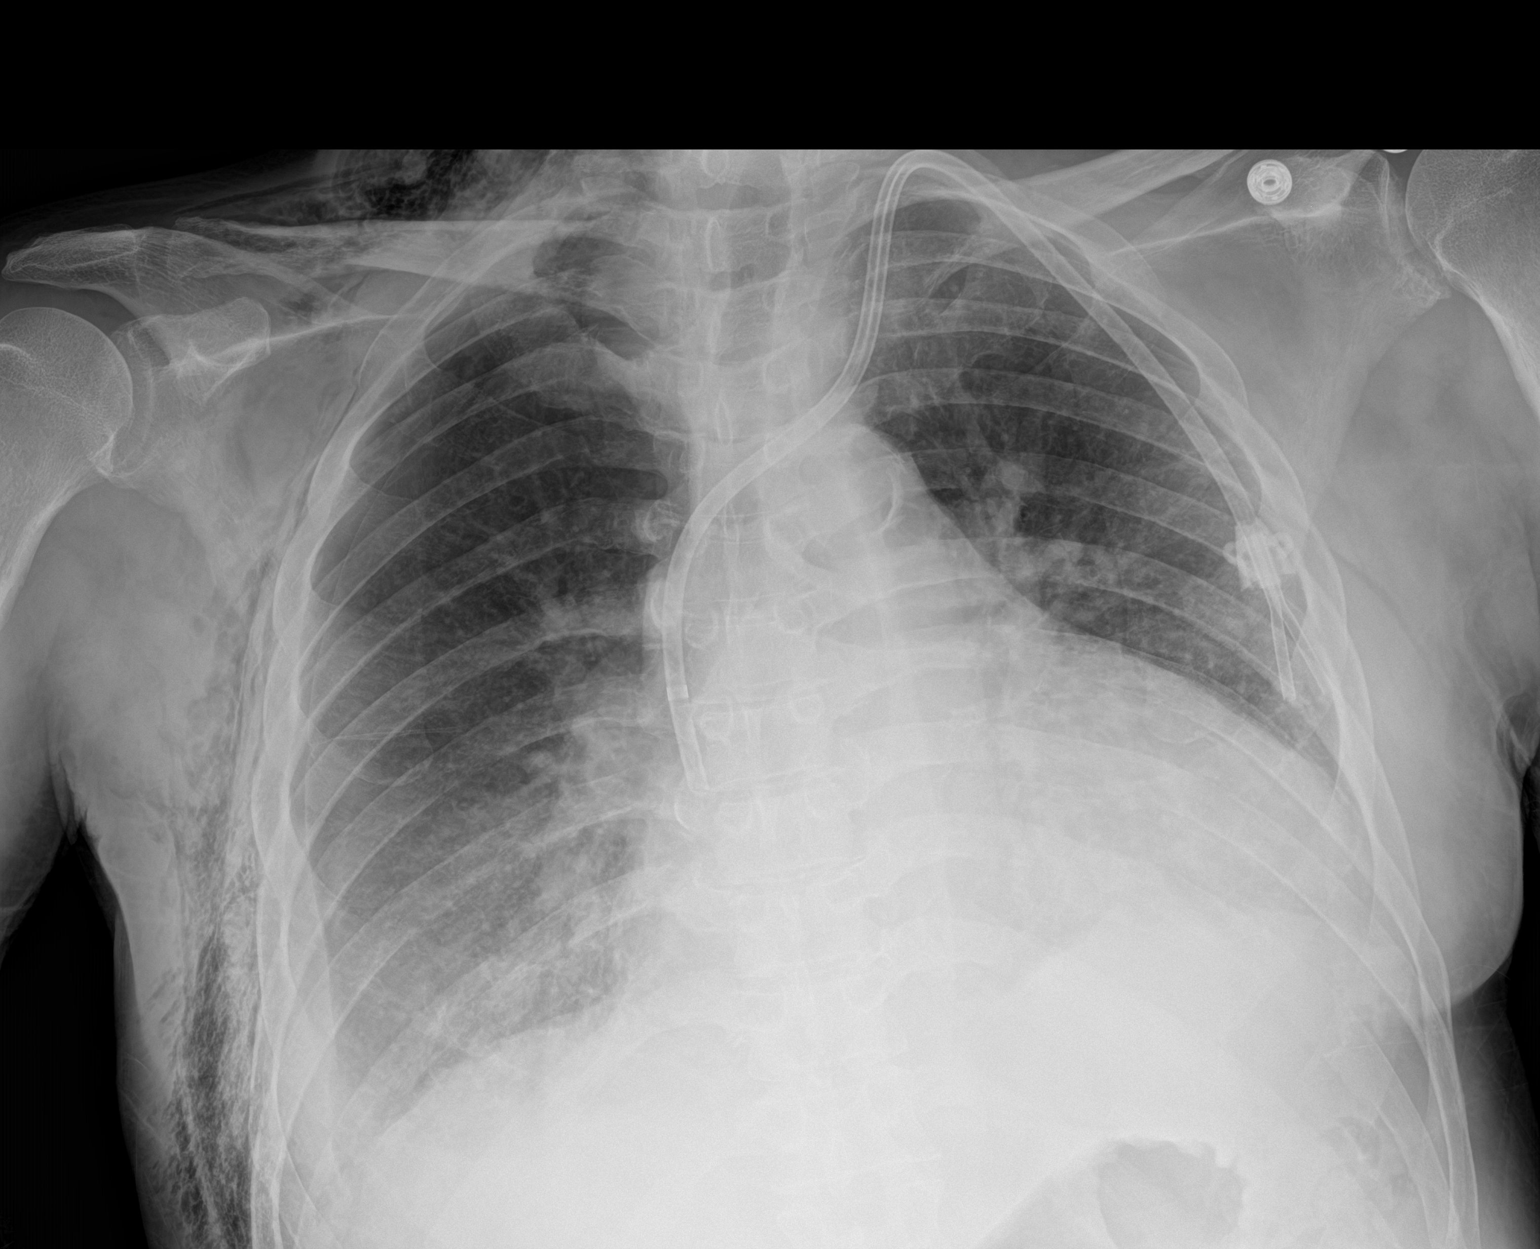

[1 of 1 positions shown; findings below may reference images not displayed]

FINDINGS: The pneumothorax on the right is again noted, essentially stable in
size, without tension component. There is extensive subcutaneous air
on the right. A small amount of loculated pleural effusion is noted
on the right inferolaterally. There is no edema or airspace opacity.
There is atelectatic change in the right base. There is cardiomegaly
with pulmonary vascularity normal. Central catheter tip is in the
superior vena cava. No adenopathy. There is aortic atherosclerosis.
No bone lesions.
IMPRESSION: Stable right-sided pneumothorax without tension component. Extensive
subcutaneous air again noted on the right. Small amount of loculated
effusion inferolateral right base. Mild right base atelectasis.
Lungs elsewhere clear.

Stable cardiomegaly.  Stable central catheter positioning.

Aortic Atherosclerosis ([id number]).

## 2020-09-03 ENCOUNTER — Telehealth: Payer: Self-pay

## 2020-09-03 MED ORDER — METOPROLOL SUCCINATE ER 25 MG PO TB24: 12.5000 mg | ORAL_TABLET | Freq: Every day | ORAL | 3 refills | Status: DC

## 2020-09-03 NOTE — Telephone Encounter (Signed)
Pt needs refill on metoprolol succinate that was prescribed while he was in the hospital sent to [facility].

## 2020-09-03 NOTE — Telephone Encounter (Signed)
Let them know I sent the Rx

## 2020-09-03 NOTE — Telephone Encounter (Signed)
Let me make sure Dr Silvio Pate is wanting to take over the medication and I will send it in.

## 2020-09-11 ENCOUNTER — Other Ambulatory Visit: Payer: Self-pay

## 2020-09-18 ENCOUNTER — Other Ambulatory Visit: Payer: Medicare Other

## 2020-09-18 ENCOUNTER — Telehealth: Payer: Self-pay

## 2020-09-18 NOTE — Telephone Encounter (Signed)
Patient's spouse called to cancel appointment with the [facility] infusion clinic. Confirmed cancellation with infusion clinic and patient's spouse. She verbalized understanding.

## 2020-09-19 ENCOUNTER — Encounter (HOSPITAL_COMMUNITY): Payer: Medicare Other

## 2020-09-19 NOTE — Telephone Encounter (Signed)
Opened in error

## 2020-09-20 ENCOUNTER — Encounter (HOSPITAL_COMMUNITY): Payer: Medicare Other

## 2020-09-28 ENCOUNTER — Telehealth: Payer: Self-pay | Admitting: Internal Medicine

## 2020-09-28 DIAGNOSIS — Z0279 Encounter for issue of other medical certificate: Secondary | ICD-10-CM

## 2020-09-28 NOTE — Telephone Encounter (Signed)
Mrs.Cross aware.  Paperwork faxed. Copy for pt Copy for scan Copy for billing

## 2020-09-28 NOTE — Telephone Encounter (Signed)
Form signed °Thanks for your help °

## 2020-09-28 NOTE — Telephone Encounter (Signed)
Devin Harmon is having FMLA paperwork faxed to [place] For her to help her mom sit with her dad while he is having dialysis and transportation  Start 10/08/20 for 6 months  intermittent   For  3 days weekly Lasting 1 day

## 2020-09-28 NOTE — Telephone Encounter (Signed)
Paperwork filled and placed in Dr.Letvak's inbox for review and signature.

## 2021-01-04 ENCOUNTER — Other Ambulatory Visit: Payer: Self-pay | Admitting: Internal Medicine

## 2021-02-12 ENCOUNTER — Encounter: Payer: Self-pay | Admitting: Internal Medicine

## 2021-02-12 ENCOUNTER — Ambulatory Visit (INDEPENDENT_AMBULATORY_CARE_PROVIDER_SITE_OTHER): Payer: Medicare Other | Admitting: Internal Medicine

## 2021-02-12 ENCOUNTER — Other Ambulatory Visit: Payer: Self-pay

## 2021-02-12 DIAGNOSIS — N186 End stage renal disease: Secondary | ICD-10-CM

## 2021-02-12 DIAGNOSIS — I5022 Chronic systolic (congestive) heart failure: Secondary | ICD-10-CM | POA: Diagnosis not present

## 2021-02-12 DIAGNOSIS — G8191 Hemiplegia, unspecified affecting right dominant side: Secondary | ICD-10-CM | POA: Diagnosis not present

## 2021-02-12 DIAGNOSIS — I10 Essential (primary) hypertension: Secondary | ICD-10-CM | POA: Diagnosis not present

## 2021-02-12 DIAGNOSIS — F0150 Vascular dementia without behavioral disturbance: Secondary | ICD-10-CM

## 2021-02-12 DIAGNOSIS — F39 Unspecified mood [affective] disorder: Secondary | ICD-10-CM

## 2021-02-12 DIAGNOSIS — Z992 Dependence on renal dialysis: Secondary | ICD-10-CM

## 2021-02-12 MED ORDER — TRAZODONE HCL 50 MG PO TABS: 25.0000 mg | ORAL_TABLET | Freq: Every day | ORAL | 5 refills | Status: DC

## 2021-02-12 NOTE — Assessment & Plan Note (Signed)
Clear cognitive changes since malignant HTN and apparent stroke syndromes Wife manages care No decline lately

## 2021-02-12 NOTE — Progress Notes (Signed)
Subjective:    Patient ID: Devin Harmon, male    DOB: December 10, 1955, 65 y.o.   MRN: [id number]  HPI Here for follow up of chronic medical conditions--with wife This visit occurred during the SARS-CoV-2 public health emergency.  Safety protocols were in place, including screening questions prior to the visit, additional usage of staff PPE, and extensive cleaning of exam room while observing appropriate contact time as indicated for disinfecting solutions.   Still has trouble with dialysis Wife has to sit with him---will become agitated after 1 hour Then will stand up---wound up infiltrating the fistula (1 clotted, the other infiltrated) Now using tunneled catheter  Good appetite Weight is pretty stable--monitored at dialysis  Having some trouble sleeping Will be up in kitchen eating at night--then fall asleep while standing in the kitchen. Sometimes makes it back to bed May sleep 2 hours in bed Melatonin actually made him more agitated  Wife still notes right sided weakness Slight decreased mobility on right side  Current Outpatient Medications on File Prior to Visit  Medication Sig Dispense Refill  . amLODipine (NORVASC) 10 MG tablet TAKE 1 TABLET BY MOUTH EVERY DAY 90 tablet 3  . AURYXIA 1 GM 210 MG(Fe) tablet Take 420 mg by mouth daily.    . B Complex-C-Folic Acid (RENA-VITE RX) 1 MG TABS Take 1 mg by mouth daily.    . cetirizine (ZYRTEC) 10 MG tablet Take 10 mg by mouth daily as needed for allergies.     . fluticasone (FLONASE) 50 MCG/ACT nasal spray Place 1-2 sprays into both nostrils daily as needed for allergies.     . hydrocortisone cream 1 % Apply 1 application topically daily as needed (rash).    . neomycin-bacitracin-polymyxin (NEOSPORIN) ointment Apply 1 application topically daily as needed for wound care.      No current facility-administered medications on file prior to visit.    Allergies  Allergen Reactions  . Sulfa Antibiotics Hives  . Sulfonamide Derivatives  Hives  . Chlorthalidone Other (See Comments)    felt "bad" and ED   . Hydrocodone Nausea Only    dizzy    Past Medical History:  Diagnosis Date  . Allergy   . Anemia   . BPH (benign prostatic hypertrophy)   . ESRD (end stage renal disease) ([place])    MWF - [place]  . Hypertension   . Leg weakness    right  . Slurred speech 02/07/2020   ? cva per Dr Silvio Pate, and cognitive changes  . Zoster 2007   facial, hospital 2007    Past Surgical History:  Procedure Laterality Date  . A/V FISTULAGRAM N/A 12/19/2019   Procedure: A/V FISTULAGRAM - Right Arm;  Surgeon: Waynetta Sandy, MD;  Location: [place] [facility];  Service: Cardiovascular;  Laterality: N/A;  . A/V FISTULAGRAM Left 03/15/2020   Procedure: A/V FISTULAGRAM;  Surgeon: Marty Heck, MD;  Location: [place] [facility];  Service: Cardiovascular;  Laterality: Left;  . AV FISTULA PLACEMENT Right 09/01/2019   Procedure: ARTERIOVENOUS (AV) FISTULA CREATION RIGHT ARM;  Surgeon: Marty Heck, MD;  [facility];  Service: Vascular;  Laterality: Right;  . AV FISTULA PLACEMENT Left 02/09/2020   Procedure: LEFT ARM BASILIC VEIN Arteriovenous FISTULA  CREATION;  Surgeon: Marty Heck, MD;  Location: [place];  Service: Vascular;  Laterality: Left;  . BASCILIC VEIN TRANSPOSITION Left 05/17/2020   Procedure: LEFT ARM SECOND STAGE BASCILIC FISTULA;  Surgeon: Marty Heck, MD;  Location: [facility]  OR;  Service: Vascular;  Laterality: Left;  . DIALYSIS/PERMA CATHETER INSERTION N/A 04/15/2019   Procedure: DIALYSIS/PERMA CATHETER INSERTION;  Surgeon: Katha Cabal, MD;  Location: [place] [facility];  Service: Cardiovascular;  Laterality: N/A;  . INSERTION OF DIALYSIS CATHETER Left 11/15/2019   Procedure: INSERTION OF LEFT TUNNEL DIALYSIS CATHETER;  Surgeon: Waynetta Sandy, MD;  Location: [place];  Service: Vascular;  Laterality: Left;  . LIGATION OF ARTERIOVENOUS  FISTULA Right 01/03/2020   Procedure:  LIGATION OF ARTERIOVENOUS  FISTULA;  Surgeon: Serafina Mitchell, MD;  Location: [place];  Service: Vascular;  Laterality: Right;  . PERIPHERAL VASCULAR BALLOON ANGIOPLASTY Left 03/15/2020   Procedure: PERIPHERAL VASCULAR BALLOON ANGIOPLASTY;  Surgeon: Marty Heck, MD;  Location: [place] [facility];  Service: Cardiovascular;  Laterality: Left;  UPPER ARM  . PERIPHERAL VASCULAR INTERVENTION Right 12/19/2019   Procedure: PERIPHERAL VASCULAR INTERVENTION;  Surgeon: Waynetta Sandy, MD;  Location: [place] [facility];  Service: Cardiovascular;  Laterality: Right;  . REMOVAL OF A DIALYSIS CATHETER Right 11/15/2019   Procedure: REMOVAL OF A RIGHT TUNNEL DIALYSIS CATHETER;  Surgeon: Waynetta Sandy, MD;  Location: [place];  Service: Vascular;  Laterality: Right;    Family History  Problem Relation Age of Onset  . Hypertension Mother   . Cancer Mother        lung cancer  . Hypertension Brother   . Heart disease Father   . Coronary artery disease Neg Hx   . Diabetes Neg Hx     Social History   Socioeconomic History  . Marital status: Married    Spouse name: Not on file  . Number of children: 2  . Years of education: Not on file  . Highest education level: Not on file  Occupational History  . Occupation: Museum/gallery curator, building new homes  Tobacco Use  . Smoking status: Never Smoker  . Smokeless tobacco: Never Used  Vaping Use  . Vaping Use: Never used  Substance and Sexual Activity  . Alcohol use: Not Currently    Comment: occasional but none since 2019  . Drug use: Not Currently  . Sexual activity: Not on file  Other Topics Concern  . Not on file  Social History Narrative  . Not on file   Social Determinants of Health   Financial Resource Strain: Not on file  Food Insecurity: Not on file  Transportation Needs: Not on file  Physical Activity: Not on file  Stress: Not on file  Social Connections: Not on file  Intimate Partner Violence: Not on file   Review of  Systems  No chest pain No SOB Still walks around the yard--when weather allows. Will feed chickens (has for eggs)     Objective:   Physical Exam Cardiovascular:     Rate and Rhythm: Normal rate and regular rhythm.     Heart sounds: No murmur heard. No gallop.   Pulmonary:     Effort: Pulmonary effort is normal.     Breath sounds: Normal breath sounds. No wheezing or rales.  Abdominal:     Palpations: Abdomen is soft.     Tenderness: There is no abdominal tenderness.  Musculoskeletal:     Cervical back: Neck supple.     Comments: Tense calves without significant enlargement (or at most 1+)  Lymphadenopathy:     Cervical: No cervical adenopathy.  Neurological:     Mental Status: He is alert.     Comments: Mild right side weakness Minimal speech but  does engage            Assessment & Plan:

## 2021-02-12 NOTE — Assessment & Plan Note (Signed)
Compensated now Volume control with dialysis

## 2021-02-12 NOTE — Assessment & Plan Note (Signed)
Ongoing mild weakness

## 2021-02-12 NOTE — Assessment & Plan Note (Signed)
Gets agitated easy---like at dialysis Restless at night as well Will try low dose trazodone

## 2021-02-12 NOTE — Patient Instructions (Signed)
Please start the trazodone at 1/2 tab ('25mg'$ ) nightly. If he tolerates it but it isn't helping him sleep, you can increase to a full tab after a few days. We can consider further increase if needed---contact me for instructions.

## 2021-02-12 NOTE — Assessment & Plan Note (Signed)
BP Readings from Last 3 Encounters:  02/12/21 140/76  08/07/20 126/78  07/10/20 126/84   Now managed with dialysis and amlodipine

## 2021-02-12 NOTE — Assessment & Plan Note (Signed)
Gets antsy in dialysis Needs to have family member to keep him calm Infiltrated fistula by getting up during dialysis--now using tunneled catheter

## 2021-03-06 ENCOUNTER — Other Ambulatory Visit: Payer: Self-pay | Admitting: Internal Medicine

## 2021-03-29 ENCOUNTER — Telehealth: Payer: Self-pay | Admitting: Internal Medicine

## 2021-03-29 NOTE — Telephone Encounter (Signed)
Received Paperwork. Put in Devin Harmon's box. EM

## 2021-04-01 NOTE — Telephone Encounter (Signed)
Spoke with daughter about paperwork, needs 6 month continuation of FMLA to care for father  Paperwork semi completed, placed in PCP's inbox for review, sign and date

## 2021-04-01 NOTE — Telephone Encounter (Signed)
LVM for pts daughter that the completed FMLA forms have been signed and  faxed  Copy mailed to pt  Copy for billing  Copy for scan   Copy retained by me

## 2021-04-01 NOTE — Telephone Encounter (Signed)
Form signed. Thanks!

## 2021-08-20 ENCOUNTER — Ambulatory Visit (INDEPENDENT_AMBULATORY_CARE_PROVIDER_SITE_OTHER): Payer: Medicare Other | Admitting: Internal Medicine

## 2021-08-20 ENCOUNTER — Encounter: Payer: Self-pay | Admitting: Internal Medicine

## 2021-08-20 ENCOUNTER — Other Ambulatory Visit: Payer: Self-pay

## 2021-08-20 VITALS — BP 126/80 | HR 67 | Temp 97.3°F | Ht 65.0 in | Wt 146.0 lb

## 2021-08-20 DIAGNOSIS — Z23 Encounter for immunization: Secondary | ICD-10-CM

## 2021-08-20 DIAGNOSIS — I5022 Chronic systolic (congestive) heart failure: Secondary | ICD-10-CM

## 2021-08-20 DIAGNOSIS — G8191 Hemiplegia, unspecified affecting right dominant side: Secondary | ICD-10-CM

## 2021-08-20 DIAGNOSIS — Z0000 Encounter for general adult medical examination without abnormal findings: Secondary | ICD-10-CM

## 2021-08-20 DIAGNOSIS — F0150 Vascular dementia without behavioral disturbance: Secondary | ICD-10-CM

## 2021-08-20 DIAGNOSIS — N186 End stage renal disease: Secondary | ICD-10-CM

## 2021-08-20 DIAGNOSIS — Z7189 Other specified counseling: Secondary | ICD-10-CM

## 2021-08-20 DIAGNOSIS — E441 Mild protein-calorie malnutrition: Secondary | ICD-10-CM

## 2021-08-20 DIAGNOSIS — Z992 Dependence on renal dialysis: Secondary | ICD-10-CM

## 2021-08-20 NOTE — Progress Notes (Signed)
Subjective:    Patient ID: Devin Harmon, male    DOB: Jun 19, 1956, 65 y.o.   MRN: [id number]  HPI Here for initial Medicare wellness visit and follow up of chronic health conditions Wife is with him as usual This visit occurred during the SARS-CoV-2 public health emergency.  Safety protocols were in place, including screening questions prior to the visit, additional usage of staff PPE, and extensive cleaning of exam room while observing appropriate contact time as indicated for disinfecting solutions.   Reviewed advanced directives Reviewed other doctors---Dr Clark--vascular surgery, Dr Webb--nephrologist at center, [facility] No hospitalizations or surgery this year No tobacco or alcohol Walks around yard--no formal exercise Vision is fine Mild hearing loss per wife Several falls but no sig injury No depression or anhedonia Wife does all the instrumental ADLs Known dementia  Still on hemodialysis Wife has to sit with him--gets antsy but wife gets him to 3.5 hours (numbers better) Has infiltrated fistula--so they use his catheter for dialysis  Still with some right sided weakness Walks on his own He will start dressing/bathing--but wife often has to help Remains continent mostly (occ urine incontinence)  Appetite is good Has lost more weight---but they feel he is at his dry weight now with dialysis No SOB or chest pain No dizziness or syncope Occasional mild right foot edema  Current Outpatient Medications on File Prior to Visit  Medication Sig Dispense Refill   amLODipine (NORVASC) 10 MG tablet TAKE 1 TABLET BY MOUTH EVERY DAY 90 tablet 3   AURYXIA 1 GM 210 MG(Fe) tablet Take 420 mg by mouth daily.     B Complex-C-Folic Acid (RENA-VITE RX) 1 MG TABS Take 1 mg by mouth daily.     cetirizine (ZYRTEC) 10 MG tablet Take 10 mg by mouth daily as needed for allergies.      fluticasone (FLONASE) 50 MCG/ACT nasal spray Place 1-2 sprays into both nostrils daily as needed for  allergies.      hydrocortisone cream 1 % Apply 1 application topically daily as needed (rash).     neomycin-bacitracin-polymyxin (NEOSPORIN) ointment Apply 1 application topically daily as needed for wound care.      No current facility-administered medications on file prior to visit.    Allergies  Allergen Reactions   Sulfa Antibiotics Hives   Sulfonamide Derivatives Hives   Chlorthalidone Other (See Comments)    felt "bad" and ED    Hydrocodone Nausea Only    dizzy    Past Medical History:  Diagnosis Date   Allergy    Anemia    BPH (benign prostatic hypertrophy)    ESRD (end stage renal disease) ([place])    MWF - [place]   Hypertension    Leg weakness    right   Slurred speech 02/07/2020   ? cva per Dr Silvio Pate, and cognitive changes   Zoster 2007   facial, hospital 2007    Past Surgical History:  Procedure Laterality Date   A/V FISTULAGRAM N/A 12/19/2019   Procedure: A/V FISTULAGRAM - Right Arm;  Surgeon: Waynetta Sandy, MD;  Location: [facility];  Service: Cardiovascular;  Laterality: N/A;   A/V FISTULAGRAM Left 03/15/2020   Procedure: A/V FISTULAGRAM;  Surgeon: Marty Heck, MD;  Location: [facility] [facility];  Service: Cardiovascular;  Laterality: Left;   AV FISTULA PLACEMENT Right 09/01/2019   Procedure: ARTERIOVENOUS (AV) FISTULA CREATION RIGHT ARM;  Surgeon: Marty Heck, MD;  Location: [place];  Service: Vascular;  Laterality:  Right;   AV FISTULA PLACEMENT Left 02/09/2020   Procedure: LEFT ARM BASILIC VEIN Arteriovenous FISTULA  CREATION;  Surgeon: Marty Heck, MD;  Location: [place];  Service: Vascular;  Laterality: Left;   [place] Left 05/17/2020   Procedure: LEFT ARM SECOND STAGE [place];  Surgeon: Marty Heck, MD;  Location: [place];  Service: Vascular;  Laterality: Left;   DIALYSIS/PERMA CATHETER INSERTION N/A 04/15/2019   Procedure: DIALYSIS/PERMA CATHETER INSERTION;  Surgeon: Katha Cabal, MD;  Location: [place] [facility];  Service: Cardiovascular;  Laterality: N/A;   INSERTION OF DIALYSIS CATHETER Left 11/15/2019   Procedure: INSERTION OF LEFT TUNNEL DIALYSIS CATHETER;  Surgeon: Waynetta Sandy, MD;  Location: [place];  Service: Vascular;  Laterality: Left;   LIGATION OF ARTERIOVENOUS  FISTULA Right 01/03/2020   Procedure: LIGATION OF ARTERIOVENOUS  FISTULA;  Surgeon: Serafina Mitchell, MD;  [facility];  Service: Vascular;  Laterality: Right;   PERIPHERAL VASCULAR BALLOON ANGIOPLASTY Left 03/15/2020   Procedure: PERIPHERAL VASCULAR BALLOON ANGIOPLASTY;  Surgeon: Marty Heck, MD;  Location: [place] [facility];  Service: Cardiovascular;  Laterality: Left;  UPPER ARM   PERIPHERAL VASCULAR INTERVENTION Right 12/19/2019   Procedure: PERIPHERAL VASCULAR INTERVENTION;  Surgeon: Waynetta Sandy, MD;  Location: [place] [facility];  Service: Cardiovascular;  Laterality: Right;   REMOVAL OF A DIALYSIS CATHETER Right 11/15/2019   Procedure: REMOVAL OF A RIGHT TUNNEL DIALYSIS CATHETER;  Surgeon: Waynetta Sandy, MD;  Location: [place];  Service: Vascular;  Laterality: Right;    Family History  Problem Relation Age of Onset   Hypertension Mother    Cancer Mother        lung cancer   Hypertension Brother    Heart disease Father    Coronary artery disease Neg Hx    Diabetes Neg Hx     Social History   Socioeconomic History   Marital status: Married    Spouse name: Not on file   Number of children: 2   Years of education: Not on file   Highest education level: Not on file  Occupational History   Occupation: Museum/gallery curator, building new homes  Tobacco Use   Smoking status: Never   Smokeless tobacco: Never  Vaping Use   Vaping Use: Never used  Substance and Sexual Activity   Alcohol use: Not Currently    Comment: occasional but none since 2019   Drug use: Not Currently   Sexual activity: Not on file  Other Topics Concern   Not on file   Social History Narrative   No living will   Wife is decision maker---alternate would be daughter Tera Mater for DNR--done 08/20/21   No feeding tube   Social Determinants of Health   Financial Resource Strain: Not on file  Food Insecurity: Not on file  Transportation Needs: Not on file  Physical Activity: Not on file  Stress: Not on file  Social Connections: Not on file  Intimate Partner Violence: Not on file   Review of Systems Sleep is not great---but not somnolent Teeth okay ---does not see dentist No suspicious skin lesions Wears seat belt No heartburn or dysphagia Bowels are fine--no blood Decreased urination--stream seems okay (oliguric) No sig back or joint pains    Objective:   Physical Exam Constitutional:      Comments: Seems to have some wasting  HENT:     Mouth/Throat:     Comments: No lesions Eyes:  Conjunctiva/sclera: Conjunctivae normal.     Pupils: Pupils are equal, round, and reactive to light.  Cardiovascular:     Rate and Rhythm: Normal rate and regular rhythm.     Pulses: Normal pulses.     Heart sounds: No murmur heard.   No gallop.  Pulmonary:     Effort: Pulmonary effort is normal.     Breath sounds: Normal breath sounds. No wheezing or rales.  Abdominal:     Palpations: Abdomen is soft.     Tenderness: There is no abdominal tenderness.  Musculoskeletal:     Cervical back: Neck supple.     Left lower leg: No edema.     Comments: 1+ right ankle and foot edema  Lymphadenopathy:     Cervical: No cervical adenopathy.  Skin:    Findings: No lesion or rash.  Neurological:     Mental Status: He is alert.     Comments: Some psychomotor retardation which is the same since stroke Answers questiions----yes or no only Wife helps him with shoes/socks, etc Mild ataxia and right side weakness  Psychiatric:        Mood and Affect: Mood normal.           Assessment & Plan:

## 2021-08-20 NOTE — Assessment & Plan Note (Signed)
Since CVA Moderate functional needs--wife provides care

## 2021-08-20 NOTE — Assessment & Plan Note (Signed)
Continues on hemodialysis

## 2021-08-20 NOTE — Assessment & Plan Note (Signed)
Stable mild right weakness Probably at least largely responsible for falls Encouraged trying a walking stick again---tends to forget it

## 2021-08-20 NOTE — Assessment & Plan Note (Signed)
Dry weight but some muscle wasting Discussed adding boost/ensure

## 2021-08-20 NOTE — Assessment & Plan Note (Signed)
I have personally reviewed the Medicare Annual Wellness questionnaire and have noted 1. The patient's medical and social history 2. Their use of alcohol, tobacco or illicit drugs 3. Their current medications and supplements 4. The patient's functional ability including ADL's, fall risks, home safety risks and hearing or visual             impairment. 5. Diet and physical activities 6. Evidence for depression or mood disorders  The patients weight, height, BMI and visual acuity have been recorded in the chart I have made referrals, counseling and provided education to the patient based review of the above and I have provided the pt with a written personalized care plan for preventive services.  I have provided you with a copy of your personalized plan for preventive services. Please take the time to review along with your updated medication list.  Flu vaccine today Bivalent COVID at the pharmacy No cancer screening after discussion (given overall status) Tries to stay active

## 2021-08-20 NOTE — Assessment & Plan Note (Signed)
Fluid status neutral--controlled by the dialysis No meds

## 2021-08-20 NOTE — Progress Notes (Signed)
Hearing Screening - Comments:: Unable to obtain Vision Screening - Comments:: Unable to obtain

## 2021-08-20 NOTE — Addendum Note (Signed)
Addended by: Pilar Grammes on: 08/20/2021 12:27 PM   Modules accepted: Orders

## 2021-08-20 NOTE — Assessment & Plan Note (Signed)
DNR done

## 2021-09-20 DIAGNOSIS — E870 Hyperosmolality and hypernatremia: Secondary | ICD-10-CM | POA: Insufficient documentation

## 2022-01-31 ENCOUNTER — Other Ambulatory Visit: Payer: Self-pay | Admitting: Internal Medicine

## 2022-02-19 ENCOUNTER — Other Ambulatory Visit: Payer: Self-pay

## 2022-02-19 ENCOUNTER — Ambulatory Visit: Payer: Medicare Other | Admitting: Vascular Surgery

## 2022-02-19 VITALS — BP 146/85 | HR 65 | Temp 98.1°F | Resp 20 | Ht 65.0 in

## 2022-02-19 DIAGNOSIS — N186 End stage renal disease: Secondary | ICD-10-CM

## 2022-02-19 DIAGNOSIS — Z992 Dependence on renal dialysis: Secondary | ICD-10-CM

## 2022-02-19 NOTE — H&P (View-Only) (Signed)
? ?Patient ID: Devin Harmon, male   DOB: Jun 02, 1956, 66 y.o.   MRN: [id number] ? ?Reason for Consult: Follow-up ?  ?Referred by Venia Carbon, MD ? ?Subjective:  ?   ?HPI: ? ?Devin Harmon is a 66 y.o. male with end-stage renal disease and most likely had a second stage basilic vein transposition fistula on the left.  Currently dialyzing via catheter after having a stroke does not move his left arm much.  Dialyzes Mondays and Wednesdays.  No blood thinners.  Significant swelling of the left upper extremity for the past month.  No tissue loss or ulceration.  He does have significant pain per his family member at bedside today. ? ?Past Medical History:  ?Diagnosis Date  ? Allergy   ? Anemia   ? BPH (benign prostatic hypertrophy)   ? ESRD (end stage renal disease) ([place])   ? MWF - [place]  ? Hypertension   ? Leg weakness   ? right  ? Slurred speech 02/07/2020  ? ? cva per Dr Silvio Pate, and cognitive changes  ? Zoster 2007  ? facial, hospital 2007  ? ?Family History  ?Problem Relation Age of Onset  ? Hypertension Mother   ? Cancer Mother   ?     lung cancer  ? Hypertension Brother   ? Heart disease Father   ? Coronary artery disease Neg Hx   ? Diabetes Neg Hx   ? ?Past Surgical History:  ?Procedure Laterality Date  ? A/V FISTULAGRAM N/A 12/19/2019  ? Procedure: A/V FISTULAGRAM - Right Arm;  Surgeon: Waynetta Sandy, MD;  Location: [facility];  Service: Cardiovascular;  Laterality: N/A;  ? A/V FISTULAGRAM Left 03/15/2020  ? Procedure: A/V FISTULAGRAM;  Surgeon: Marty Heck, MD;  Location: [place] [facility];  Service: Cardiovascular;  Laterality: Left;  ? AV FISTULA PLACEMENT Right 09/01/2019  ? Procedure: ARTERIOVENOUS (AV) FISTULA CREATION RIGHT ARM;  Surgeon: Marty Heck, MD;  Location: [place];  Service: Vascular;  Laterality: Right;  ? AV FISTULA PLACEMENT Left 02/09/2020  ? Procedure: LEFT ARM BASILIC VEIN Arteriovenous FISTULA  CREATION;  Surgeon: Marty Heck, MD;  Location:  [facility];  Service: Vascular;  Laterality: Left;  ? BASCILIC VEIN TRANSPOSITION Left 05/17/2020  ? Procedure: LEFT ARM SECOND STAGE BASCILIC FISTULA;  Surgeon: Marty Heck, MD;  Location: [facility];  Service: Vascular;  Laterality: Left;  ? DIALYSIS/PERMA CATHETER INSERTION N/A 04/15/2019  ? Procedure: DIALYSIS/PERMA CATHETER INSERTION;  Surgeon: Katha Cabal, MD;  Location: [facility];  Service: Cardiovascular;  Laterality: N/A;  ? INSERTION OF DIALYSIS CATHETER Left 11/15/2019  ? Procedure: INSERTION OF LEFT TUNNEL DIALYSIS CATHETER;  Surgeon: Waynetta Sandy, MD;  Location: [facility];  Service: Vascular;  Laterality: Left;  ? LIGATION OF ARTERIOVENOUS  FISTULA Right 01/03/2020  ? Procedure: LIGATION OF ARTERIOVENOUS  FISTULA;  Surgeon: Serafina Mitchell, MD;  Location: [facility];  Service: Vascular;  Laterality: Right;  ? PERIPHERAL VASCULAR BALLOON ANGIOPLASTY Left 03/15/2020  ? Procedure: PERIPHERAL VASCULAR BALLOON ANGIOPLASTY;  Surgeon: Marty Heck, MD;  Location: [facility];  Service: Cardiovascular;  Laterality: Left;  UPPER ARM  ? PERIPHERAL VASCULAR INTERVENTION Right 12/19/2019  ? Procedure: PERIPHERAL VASCULAR INTERVENTION;  Surgeon: Waynetta Sandy, MD;  Location: [facility];  Service: Cardiovascular;  Laterality: Right;  ? REMOVAL OF A DIALYSIS CATHETER Right 11/15/2019  ? Procedure: REMOVAL OF A RIGHT TUNNEL DIALYSIS CATHETER;  Surgeon: Donzetta Matters,  Georgia Dom, MD;  Location: [facility];  Service: Vascular;  Laterality: Right;  ? ? ?Short Social History:  ?Social History  ? ?Tobacco Use  ? Smoking status: Never  ? Smokeless tobacco: Never  ?Substance Use Topics  ? Alcohol use: Not Currently  ?  Comment: occasional but none since 2019  ? ? ?Allergies  ?Allergen Reactions  ? Sulfa Antibiotics Hives  ? Sulfonamide Derivatives Hives  ? Chlorthalidone Other (See Comments)  ?  felt "bad" and ED ?  ? Hydrocodone Nausea Only  ?  dizzy  ? ? ?Current Outpatient  Medications  ?Medication Sig Dispense Refill  ? amLODipine (NORVASC) 10 MG tablet TAKE 1 TABLET BY MOUTH EVERY DAY 90 tablet 3  ? AURYXIA 1 GM 210 MG(Fe) tablet Take 420 mg by mouth daily.    ? B Complex-C-Folic Acid (RENA-VITE RX) 1 MG TABS Take 1 mg by mouth daily.    ? cetirizine (ZYRTEC) 10 MG tablet Take 10 mg by mouth daily as needed for allergies.     ? fluticasone (FLONASE) 50 MCG/ACT nasal spray Place 1-2 sprays into both nostrils daily as needed for allergies.     ? hydrocortisone cream 1 % Apply 1 application topically daily as needed (rash).    ? neomycin-bacitracin-polymyxin (NEOSPORIN) ointment Apply 1 application topically daily as needed for wound care.     ? ?No current facility-administered medications for this visit.  ? ? ?Review of Systems  ?Constitutional:  Constitutional negative. ?HENT: HENT negative.  ?Eyes: Eyes negative.  ?Respiratory: Respiratory negative.  ?Cardiovascular: Cardiovascular negative.  ?GI: Gastrointestinal negative.  ?Musculoskeletal:  ?     Left arm swelling ?Skin: Skin negative.  ?Neurological: Positive for focal weakness.  ?Hematologic: Hematologic/lymphatic negative.  ?Psychiatric: Psychiatric negative.   ? ?   ?Objective:  ?Objective  ?Vitals:  ? 02/19/22 0957  ?BP: (!) 146/85  ?Pulse: 65  ?Resp: 20  ?Temp: 98.1 ?F (36.7 ?C)  ?SpO2: 96%  ? ? ? ?Physical Exam ?HENT:  ?   Head: Normocephalic.  ?   Nose: Rhinorrhea present.  ?Eyes:  ?   Pupils: Pupils are equal, round, and reactive to light.  ?Cardiovascular:  ?   Pulses:     ?     Radial pulses are 2+ on the right side.  ?   Comments: Cannot palpate left radial pulse due to edema ?Pulmonary:  ?   Effort: Pulmonary effort is normal.  ?Abdominal:  ?   General: Abdomen is flat.  ?Musculoskeletal:  ?   Comments: Left upper extremity non pitting edema throughout  ?Skin: ?   Capillary Refill: Capillary refill takes less than 2 seconds.  ?   Comments: Peau d'orange appearance left forearm  ?Neurological:  ?   Mental Status: He  is alert.  ?   Motor: Weakness present.  ? ? ?Data: ?No studies ?    ?Assessment/Plan:  ?  ?66 year old male with severe swelling left upper extremity likely has central venous stenosis or occlusion secondary to catheter does not use the fistula on the left due to multiple infiltration events.  Plan will be to ligate the left arm fistula given his severe swelling.  We will do this on a nondialysis day in the near future.  I discussed the risk benefits alternatives with the patient and his family member at bedside demonstrate good understanding. ? ?  ? ?Waynetta Sandy MD ?[facility] ? ? ?

## 2022-02-19 NOTE — Progress Notes (Signed)
? ?Patient ID: Devin Harmon, male   DOB: Jul 30, 1956, 66 y.o.   MRN: [id number] ? ?Reason for Consult: Follow-up ?  ?Referred by Venia Carbon, MD ? ?Subjective:  ?   ?HPI: ? ?Devin Harmon is a 65 y.o. male with end-stage renal disease and most likely had a second stage basilic vein transposition fistula on the left.  Currently dialyzing via catheter after having a stroke does not move his left arm much.  Dialyzes Mondays and Wednesdays.  No blood thinners.  Significant swelling of the left upper extremity for the past month.  No tissue loss or ulceration.  He does have significant pain per his family member at bedside today. ? ?Past Medical History:  ?Diagnosis Date  ? Allergy   ? Anemia   ? BPH (benign prostatic hypertrophy)   ? ESRD (end stage renal disease) ([place])   ? MWF - [place]  ? Hypertension   ? Leg weakness   ? right  ? Slurred speech 02/07/2020  ? ? cva per Dr Silvio Pate, and cognitive changes  ? Zoster 2007  ? facial, hospital 2007  ? ?Family History  ?Problem Relation Age of Onset  ? Hypertension Mother   ? Cancer Mother   ?     lung cancer  ? Hypertension Brother   ? Heart disease Father   ? Coronary artery disease Neg Hx   ? Diabetes Neg Hx   ? ?Past Surgical History:  ?Procedure Laterality Date  ? A/V FISTULAGRAM N/A 12/19/2019  ? Procedure: A/V FISTULAGRAM - Right Arm;  Surgeon: Waynetta Sandy, MD;  Location: [place] [facility];  Service: Cardiovascular;  Laterality: N/A;  ? A/V FISTULAGRAM Left 03/15/2020  ? Procedure: A/V FISTULAGRAM;  Surgeon: Marty Heck, MD;  Location: [place] [facility];  Service: Cardiovascular;  Laterality: Left;  ? AV FISTULA PLACEMENT Right 09/01/2019  ? Procedure: ARTERIOVENOUS (AV) FISTULA CREATION RIGHT ARM;  Surgeon: Marty Heck, MD;  Location: [place];  Service: Vascular;  Laterality: Right;  ? AV FISTULA PLACEMENT Left 02/09/2020  ? Procedure: LEFT ARM BASILIC VEIN Arteriovenous FISTULA  CREATION;  Surgeon: Marty Heck, MD;  Location:  [place];  Service: Vascular;  Laterality: Left;  ? BASCILIC VEIN TRANSPOSITION Left 05/17/2020  ? Procedure: LEFT ARM SECOND STAGE BASCILIC FISTULA;  Surgeon: Marty Heck, MD;  Location: [place];  Service: Vascular;  Laterality: Left;  ? DIALYSIS/PERMA CATHETER INSERTION N/A 04/15/2019  ? Procedure: DIALYSIS/PERMA CATHETER INSERTION;  Surgeon: Katha Cabal, MD;  Location: [place] [facility];  Service: Cardiovascular;  Laterality: N/A;  ? INSERTION OF DIALYSIS CATHETER Left 11/15/2019  ? Procedure: INSERTION OF LEFT TUNNEL DIALYSIS CATHETER;  Surgeon: Waynetta Sandy, MD;  Location: [place];  Service: Vascular;  Laterality: Left;  ? LIGATION OF ARTERIOVENOUS  FISTULA Right 01/03/2020  ? Procedure: LIGATION OF ARTERIOVENOUS  FISTULA;  Surgeon: Serafina Mitchell, MD;  Location: [facility];  Service: Vascular;  Laterality: Right;  ? PERIPHERAL VASCULAR BALLOON ANGIOPLASTY Left 03/15/2020  ? Procedure: PERIPHERAL VASCULAR BALLOON ANGIOPLASTY;  Surgeon: Marty Heck, MD;  Location: [place] [facility];  Service: Cardiovascular;  Laterality: Left;  UPPER ARM  ? PERIPHERAL VASCULAR INTERVENTION Right 12/19/2019  ? Procedure: PERIPHERAL VASCULAR INTERVENTION;  Surgeon: Waynetta Sandy, MD;  Location: [place] [facility];  Service: Cardiovascular;  Laterality: Right;  ? REMOVAL OF A DIALYSIS CATHETER Right 11/15/2019  ? Procedure: REMOVAL OF A RIGHT TUNNEL DIALYSIS CATHETER;  Surgeon: Donzetta Matters,  Georgia Dom, MD;  Location: [place];  Service: Vascular;  Laterality: Right;  ? ? ?Short Social History:  ?Social History  ? ?Tobacco Use  ? Smoking status: Never  ? Smokeless tobacco: Never  ?Substance Use Topics  ? Alcohol use: Not Currently  ?  Comment: occasional but none since 2019  ? ? ?Allergies  ?Allergen Reactions  ? Sulfa Antibiotics Hives  ? Sulfonamide Derivatives Hives  ? Chlorthalidone Other (See Comments)  ?  felt "bad" and ED ?  ? Hydrocodone Nausea Only  ?  dizzy  ? ? ?Current Outpatient  Medications  ?Medication Sig Dispense Refill  ? amLODipine (NORVASC) 10 MG tablet TAKE 1 TABLET BY MOUTH EVERY DAY 90 tablet 3  ? AURYXIA 1 GM 210 MG(Fe) tablet Take 420 mg by mouth daily.    ? B Complex-C-Folic Acid (RENA-VITE RX) 1 MG TABS Take 1 mg by mouth daily.    ? cetirizine (ZYRTEC) 10 MG tablet Take 10 mg by mouth daily as needed for allergies.     ? fluticasone (FLONASE) 50 MCG/ACT nasal spray Place 1-2 sprays into both nostrils daily as needed for allergies.     ? hydrocortisone cream 1 % Apply 1 application topically daily as needed (rash).    ? neomycin-bacitracin-polymyxin (NEOSPORIN) ointment Apply 1 application topically daily as needed for wound care.     ? ?No current facility-administered medications for this visit.  ? ? ?Review of Systems  ?Constitutional:  Constitutional negative. ?HENT: HENT negative.  ?Eyes: Eyes negative.  ?Respiratory: Respiratory negative.  ?Cardiovascular: Cardiovascular negative.  ?GI: Gastrointestinal negative.  ?Musculoskeletal:  ?     Left arm swelling ?Skin: Skin negative.  ?Neurological: Positive for focal weakness.  ?Hematologic: Hematologic/lymphatic negative.  ?Psychiatric: Psychiatric negative.   ? ?   ?Objective:  ?Objective  ?Vitals:  ? 02/19/22 0957  ?BP: (!) 146/85  ?Pulse: 65  ?Resp: 20  ?Temp: 98.1 ?F (36.7 ?C)  ?SpO2: 96%  ? ? ? ?Physical Exam ?HENT:  ?   Head: Normocephalic.  ?   Nose: Rhinorrhea present.  ?Eyes:  ?   Pupils: Pupils are equal, round, and reactive to light.  ?Cardiovascular:  ?   Pulses:     ?     Radial pulses are 2+ on the right side.  ?   Comments: Cannot palpate left radial pulse due to edema ?Pulmonary:  ?   Effort: Pulmonary effort is normal.  ?Abdominal:  ?   General: Abdomen is flat.  ?Musculoskeletal:  ?   Comments: Left upper extremity non pitting edema throughout  ?Skin: ?   Capillary Refill: Capillary refill takes less than 2 seconds.  ?   Comments: Peau d'orange appearance left forearm  ?Neurological:  ?   Mental Status: He  is alert.  ?   Motor: Weakness present.  ? ? ?Data: ?No studies ?    ?Assessment/Plan:  ?  ?66 year old male with severe swelling left upper extremity likely has central venous stenosis or occlusion secondary to catheter does not use the fistula on the left due to multiple infiltration events.  Plan will be to ligate the left arm fistula given his severe swelling.  We will do this on a nondialysis day in the near future.  I discussed the risk benefits alternatives with the patient and his family member at bedside demonstrate good understanding. ? ?  ? ?Waynetta Sandy MD ?[facility] ? ? ?

## 2022-02-20 ENCOUNTER — Other Ambulatory Visit: Payer: Self-pay

## 2022-02-26 ENCOUNTER — Other Ambulatory Visit: Payer: Self-pay

## 2022-02-26 ENCOUNTER — Encounter (HOSPITAL_COMMUNITY): Payer: Self-pay | Admitting: Vascular Surgery

## 2022-02-26 NOTE — Anesthesia Preprocedure Evaluation (Addendum)
Anesthesia Evaluation  ?Patient identified by MRN, date of birth, ID band ?Patient awake ? ? ? ?Reviewed: ?Allergy & Precautions, NPO status , Patient's Chart, lab work & pertinent test results ? ?History of Anesthesia Complications ?Negative for: history of anesthetic complications ? ?Airway ?Mallampati: IV ? ?TM Distance: >3 FB ?Neck ROM: Full ? ? ? Dental ? ?(+) Missing, Poor Dentition ?  ?Pulmonary ?neg pulmonary ROS,  ?  ?Pulmonary exam normal ? ? ? ? ? ? ? Cardiovascular ?hypertension, +CHF  ?Normal cardiovascular exam ? ? ?Echo 06/27/20: EF 40-45% with regional wall motion abnormalities (new from 04/13/19), g1dd, normal RVSF, mild pulm HTN, severe LAE, valves unremarkable ?  ?Neuro/Psych ?CVA, Residual Symptoms   ? GI/Hepatic ?negative GI ROS, Neg liver ROS,   ?Endo/Other  ?negative endocrine ROS ? Renal/GU ?ESRF and DialysisRenal disease  ?negative genitourinary ?  ?Musculoskeletal ?negative musculoskeletal ROS ?(+)  ? Abdominal ?  ?Peds ? Hematology ?negative hematology ROS ?(+)   ?Anesthesia Other Findings ? ? Reproductive/Obstetrics ? ?  ? ? ? ? ? ? ? ? ? ? ? ? ? ?  ?  ? ? ? ? ? ? ?Anesthesia Physical ?Anesthesia Plan ? ?ASA: 4 ? ?Anesthesia Plan: MAC  ? ?Post-op Pain Management: Tylenol PO (pre-op)*  ? ?Induction: Intravenous ? ?PONV Risk Score and Plan: 1 and Propofol infusion, TIVA, Treatment may vary due to age or medical condition and Dexamethasone ? ?Airway Management Planned: Natural Airway, Nasal Cannula and Simple Face Mask ? ?Additional Equipment: None ? ?Intra-op Plan:  ? ?Post-operative Plan:  ? ?Informed Consent: I have reviewed the patients History and Physical, chart, labs and discussed the procedure including the risks, benefits and alternatives for the proposed anesthesia with the patient or authorized representative who has indicated his/her understanding and acceptance.  ? ? ? ? ? ?Plan Discussed with:  ? ?Anesthesia Plan Comments: (See PAT note written  02/26/2022 by Myra Gianotti, PA-C.  Dr. Donzetta Matters felt case should not be delayed given significant LUE swelling. He thought local anesthesia could be considered.  ? ?Admission in 2021 revealed newly diagnosed CHF (EF 40-45%) with new wall motion abnormalities. Patient never received cardiology followup. This was discussed preoperatively with Dr. Donzetta Matters who feels case cannot be delayed for cardiology evaluation due to extent of LUE swelling. Will plan for local/light sedation.)  ? ? ? ? ? ?Anesthesia Quick Evaluation ? ?

## 2022-02-26 NOTE — Progress Notes (Signed)
Anesthesia Chart Review: SAME DAY WORK-UP ? Case: [id number] Date/Time: 02/27/22 0900  ? Procedure: LIGATION OF LEFT ARM FISTULA (Left)  ? Anesthesia type: Choice  ? Pre-op diagnosis: ESRD  ? [facility]: [facility] ROOM 11 / [facility]  ? Surgeons: Waynetta Sandy, MD  ? ?  ? ? ?DISCUSSION:  Patient is a 66 year old male scheduled for the above procedure. Patient started hemodialysis around 04/2019. Previous ligation of RUE AVF. S/p 2nd stage left basilic vein transposition AVF 05/17/20. Has had significant swelling LUE, and AVF not being used due to multiple infiltration events. Arm swelling thought likely related to central venous stenosis or occlusion secondary related to his catheter. Ligation of LUE AVF planned given severe LUE swelling.  ?  ?History includes never smoker, HTN, anemia, ESRD (HD initiated ~ 04/2019; HD MWF), anemia, BPH, right hemiparesis due to suspected CVA (Per PCP note 02/07/2020, "right hemiparesis:Doesn't seem to be related to fluid. I suspect CVA (though CT scan some months ago didn't show one). I believe he has vascular damage causing the cognitive and speech changes as well. Would not use statin with ESRD. Don't believe he is an aspirin candidate. Will send note to Dr Holley Raring for his review.Fortunately, he is left handed."), HFrEF (05/2020).   ? ?- [facility] admission 06/26/20-07/04/20 for progressive dyspnea with cough, edema, orthopnea. S/p AVF procedure a month prior. He had missed one of his last two HD appointments. He was placed on BiPAP. CXR showed large right pleural effusion. S/p right thoracentesis 06/26/2020 (2.3L transudative) He was then weaned down from BiPAP to 3L [place]. Echo was obtained which showed EF 40-45% with hypokinesis of the basal to mid inferior myocardium (new). Nephrology consulted, and he continued to receive his scheduled hemodialysis on MWF schedule. BP controlled. Even after thoracentesis, patient developed reaccumulation of moderate to large pleural effusion requiring  right CT 06/28/20 by PCCM with removal of 2L fluid. 07/02/20 CXR-showed new right-sided pneumothorax and more lateral positioning of pigtail. Pleural fluid culture remained negative. Covid negative. Chest tube was finally removed on 07/02/20. Supplemental O2 weaned. Serial CXRs showed stable mild right pleural effusion and pneumothorax. PT/OT recommended SNF placement but patient's wife declined. Per Discharge Summary, in-patient cardiology evaluation recommended, but patient's wife was adamant out getting him discharged, so out-patient cardiology evaluation advised with close follow-up with PCP.   ? ?He had hospital follow-up with Dr. Silvio Pate on 07/10/20. He noted new mildly reduced EF and thought likely non-ischemic, but could not be sure. He added, "He is supposed to see cardiology but I am not excited about ischemic work up for him". CXR did not reaccumulation of his pleural effusion. He noted at follow-up visit that wife never scheduled cardiology follow-up. Dr. Silvio Pate has been monitoring and documenting systolic CHF/volume controlled with dialysis and that currently not on medication for systolic CHF. Last evaluation by Dr. Silvio Pate on 08/20/21.  ? ?Reviewed above with anesthesiologist Tamela Gammon, MD. Patient with a drop in his LVEF (40-45%) with new wall motion abnormality on echo in 05/2020. (Last HD access surgery 05/2020.) It does not appear that he was ever evaluated by cardiology for this. If case is not urgent/emergent, then advised he have a preoperative cardiology evaluation. I do not see mention of any concern for steal syndrome, but notes indicate his LUE is significantly swollen. [facility] RN Herma Ard discussed with Dr. Donzetta Matters. He does not feel the case can wait given the extent of his LUE swelling. He thought likely case could be  done under local anesthesia.  ? ?I have updated Dr. Tobias Alexander. Definitive plan following anesthesia team evaluation on the day of surgery.  He is for updated labs and EKG on arrival.  ? ?  ?VS: Ht  5\' 7"  (1.702 m)   Wt 66.3 kg   BMI 22.89 kg/m?  ?BP Readings from Last 3 Encounters:  ?02/19/22 (!) 146/85  ?08/20/21 126/80  ?02/12/21 140/76  ? ?Pulse Readings from Last 3 Encounters:  ?02/19/22 65  ?08/20/21 67  ?02/12/21 72  ?  ? ?PROVIDERS: ?Venia Carbon, MD is PCP  ?Edrick Oh, MD is nephrologist ?- Seen by Charlynn Grimes, MD and Thompson Grayer, MD in 08/2018 for elevated troponin (in setting of AKI, metabolic acidosis, uncontrolled HTN) felt likely due to demand ischemia and ACS. Not felt to be a candidate for ischemic work-up at that time. He did not schedule cardiology evaluation after 05/2020 hospitalization.  ? ? ?LABS: For day of surgery. H/H 10.8/32.4% 02/24/22 ([place]). ? ? ?EKG: Last EKG available 06/26/20 and > 1 year ago.  ? ? ?CV: ?Echo 06/27/20: ?IMPRESSIONS  ? 1. Hypokinesis of the basal to mid inferior myocardium. Left ventricular  ?ejection fraction, by estimation, is 40 to 45%. The left ventricle has  ?mildly decreased function. The left ventricle demonstrates regional wall  ?motion abnormalities (see scoring  ?diagram/findings for description). Left ventricular diastolic parameters  ?are consistent with Grade I diastolic dysfunction (impaired relaxation).  ? 2. Right ventricular systolic function is normal. The right ventricular  ?size is normal. There is mildly elevated pulmonary artery systolic  ?pressure.  ? 3. Left atrial size was severely dilated.  ? 4. The mitral valve is normal in structure. Trivial mitral valve  ?regurgitation. No evidence of mitral stenosis.  ? 5. The aortic valve is tricuspid. Aortic valve regurgitation is trivial.  ?No aortic stenosis is present.  ? 6. The inferior vena cava is dilated in size with >50% respiratory  ?variability, suggesting right atrial pressure of 8 mmHg.  ?- Comparison echo 04/13/19: normal LV systolic function, LVEF 44-03%, mild LV wall thickness, pseudonormalization LV diastolic parameters, elevated mean LAP, normal  RVSF, moderate left pleural effusion, mildly dilated pulmonary artery. ? ? ?Past Medical History:  ?Diagnosis Date  ? Allergy   ? Anemia   ? BPH (benign prostatic hypertrophy)   ? ESRD (end stage renal disease) ([place])   ? M &F - [place]  ? Hypertension   ? Leg weakness   ? right  ? Slurred speech 02/07/2020  ? ? cva per Dr Silvio Pate, and cognitive changes  ? [place] 2007  ? facial, hospital 2007  ? ? ?Past Surgical History:  ?Procedure Laterality Date  ? A/V FISTULAGRAM N/A 12/19/2019  ? Procedure: A/V FISTULAGRAM - Right Arm;  Surgeon: Waynetta Sandy, MD;  Location: [facility];  Service: Cardiovascular;  Laterality: N/A;  ? A/V FISTULAGRAM Left 03/15/2020  ? Procedure: A/V FISTULAGRAM;  Surgeon: Marty Heck, MD;  Location: [facility];  Service: Cardiovascular;  Laterality: Left;  ? AV FISTULA PLACEMENT Right 09/01/2019  ? Procedure: ARTERIOVENOUS (AV) FISTULA CREATION RIGHT ARM;  Surgeon: Marty Heck, MD;  Location: [facility];  Service: Vascular;  Laterality: Right;  ? AV FISTULA PLACEMENT Left 02/09/2020  ? Procedure: LEFT ARM BASILIC VEIN Arteriovenous FISTULA  CREATION;  Surgeon: Marty Heck, MD;  Location: [facility];  Service: Vascular;  Laterality: Left;  ? BASCILIC VEIN TRANSPOSITION Left 05/17/2020  ? Procedure: LEFT  ARM SECOND STAGE BASCILIC FISTULA;  Surgeon: Marty Heck, MD;  Location: [facility];  Service: Vascular;  Laterality: Left;  ? DIALYSIS/PERMA CATHETER INSERTION N/A 04/15/2019  ? Procedure: DIALYSIS/PERMA CATHETER INSERTION;  Surgeon: Katha Cabal, MD;  Location: [facility];  Service: Cardiovascular;  Laterality: N/A;  ? INSERTION OF DIALYSIS CATHETER Left 11/15/2019  ? Procedure: INSERTION OF LEFT TUNNEL DIALYSIS CATHETER;  Surgeon: Waynetta Sandy, MD;  Location: [facility];  Service: Vascular;  Laterality: Left;  ? LIGATION OF ARTERIOVENOUS  FISTULA Right 01/03/2020  ? Procedure: LIGATION OF ARTERIOVENOUS  FISTULA;  Surgeon: Serafina Mitchell, MD;  [facility];  Service: Vascular;  Laterality: Right;  ? PERIPHERAL VASCULAR BALLOON ANGIOPLASTY Left 03/15/2020  ? Procedure: PERIPHERAL VASCULAR BALLOON ANGIOPLASTY;  Surgeon: Marina Goodell

## 2022-02-26 NOTE — Progress Notes (Signed)
Interview done with the wife [place] due to the pt's slurred speech. ? ?PCP - Dr. Silvio Pate ? ?Cardiologist - Denies ? ?EP- Denies ? ?Endocrine- Denies ? ?Pulm- Denies ? ?Chest x-ray - Denies ? ?EKG - 02/27/22- Day of surgery ? ?Stress Test - Denies ? ?ECHO - 06/27/20 (E) ? ?Cardiac Cath - Denies ? ?AICD-na ?PM-na ?LOOP-na ? ?Nerve Stimulator- Denies ? ?Dialysis- M & F- pt had a full session on Mon 3/27 ? ?Sleep Study - Denies ?CPAP - Denies ? ?LABS- 02/27/22: I-Stat 8 ? ?ASA- Denies ? ?ERAS- No ? ?HA1C- Denies ? ?Anesthesia- Yes- cardiac history- notified ? ?The wife [place] states the pt denies having chest pain, sob, or fever during the pre-op phone call. All instructions explained to [place], with a verbal understanding of the material including: as of today, stop taking all Aspirin (unless instructed by your doctor) and Other Aspirin containing products, Vitamins, Fish oils, and Herbal medications. Also stop all NSAIDS i.e. Advil, Ibuprofen, Motrin, Aleve, Anaprox, Naproxen, BC, Goody Powders, and all Supplements. [place] also instructed for them to wear a mask and social distance if they go out. The opportunity to ask questions was provided.  ?

## 2022-02-27 ENCOUNTER — Ambulatory Visit (HOSPITAL_COMMUNITY): Payer: Medicare Other | Admitting: Physician Assistant

## 2022-02-27 ENCOUNTER — Ambulatory Visit (HOSPITAL_COMMUNITY)
Admission: RE | Admit: 2022-02-27 | Discharge: 2022-02-27 | Disposition: A | Discharge status: Home / Self Care | Payer: Medicare Other | Attending: Vascular Surgery | Admitting: Vascular Surgery

## 2022-02-27 ENCOUNTER — Other Ambulatory Visit: Payer: Self-pay

## 2022-02-27 ENCOUNTER — Encounter (HOSPITAL_COMMUNITY)
Admission: RE | Disposition: A | Discharge status: Home / Self Care | Payer: Self-pay | Source: Home / Self Care | Attending: Vascular Surgery

## 2022-02-27 ENCOUNTER — Ambulatory Visit (HOSPITAL_BASED_OUTPATIENT_CLINIC_OR_DEPARTMENT_OTHER): Payer: Medicare Other | Admitting: Physician Assistant

## 2022-02-27 ENCOUNTER — Encounter (HOSPITAL_COMMUNITY): Payer: Self-pay | Admitting: Vascular Surgery

## 2022-02-27 DIAGNOSIS — E1122 Type 2 diabetes mellitus with diabetic chronic kidney disease: Secondary | ICD-10-CM | POA: Diagnosis not present

## 2022-02-27 DIAGNOSIS — N186 End stage renal disease: Secondary | ICD-10-CM

## 2022-02-27 DIAGNOSIS — I1311 Hypertensive heart and chronic kidney disease without heart failure, with stage 5 chronic kidney disease, or end stage renal disease: Secondary | ICD-10-CM | POA: Insufficient documentation

## 2022-02-27 DIAGNOSIS — N185 Chronic kidney disease, stage 5: Secondary | ICD-10-CM | POA: Diagnosis not present

## 2022-02-27 DIAGNOSIS — I509 Heart failure, unspecified: Secondary | ICD-10-CM | POA: Diagnosis not present

## 2022-02-27 DIAGNOSIS — Z992 Dependence on renal dialysis: Secondary | ICD-10-CM | POA: Diagnosis not present

## 2022-02-27 DIAGNOSIS — I132 Hypertensive heart and chronic kidney disease with heart failure and with stage 5 chronic kidney disease, or end stage renal disease: Secondary | ICD-10-CM | POA: Diagnosis not present

## 2022-02-27 DIAGNOSIS — Z8673 Personal history of transient ischemic attack (TIA), and cerebral infarction without residual deficits: Secondary | ICD-10-CM | POA: Insufficient documentation

## 2022-02-27 HISTORY — PX: LIGATION OF ARTERIOVENOUS  FISTULA: SHX5948

## 2022-02-27 LAB — POCT I-STAT, CHEM 8
BUN: 79 mg/dL — ABNORMAL HIGH (ref 8–23)
Calcium, Ion: 0.96 mmol/L — ABNORMAL LOW (ref 1.15–1.40)
Chloride: 106 mmol/L (ref 98–111)
Creatinine, Ser: 11 mg/dL — ABNORMAL HIGH (ref 0.61–1.24)
Glucose, Bld: 88 mg/dL (ref 70–99)
HCT: 36 % — ABNORMAL LOW (ref 39.0–52.0)
Hemoglobin: 12.2 g/dL — ABNORMAL LOW (ref 13.0–17.0)
Potassium: 5.2 mmol/L — ABNORMAL HIGH (ref 3.5–5.1)
Sodium: 140 mmol/L (ref 135–145)
TCO2: 25 mmol/L (ref 22–32)

## 2022-02-27 SURGERY — LIGATION OF ARTERIOVENOUS  FISTULA
Anesthesia: Monitor Anesthesia Care | Laterality: Left

## 2022-02-27 MED ORDER — FENTANYL CITRATE (PF) 250 MCG/5ML IJ SOLN
INTRAMUSCULAR | Status: AC
  Filled 2022-02-27: qty 5

## 2022-02-27 MED ORDER — FENTANYL CITRATE (PF) 100 MCG/2ML IJ SOLN: 25.0000 ug | INTRAMUSCULAR | Status: DC | PRN

## 2022-02-27 MED ORDER — ONDANSETRON HCL 4 MG/2ML IJ SOLN: 4.0000 mg | Freq: Once | INTRAMUSCULAR | Status: DC | PRN

## 2022-02-27 MED ORDER — MIDAZOLAM HCL 2 MG/2ML IJ SOLN
INTRAMUSCULAR | Status: DC | PRN
  Administered 2022-02-27: 2 mg via INTRAVENOUS

## 2022-02-27 MED ORDER — OXYCODONE HCL 5 MG/5ML PO SOLN: 5.0000 mg | Freq: Once | ORAL | Status: DC | PRN

## 2022-02-27 MED ORDER — PROPOFOL 10 MG/ML IV BOLUS
INTRAVENOUS | Status: AC
  Filled 2022-02-27: qty 20

## 2022-02-27 MED ORDER — LIDOCAINE-EPINEPHRINE (PF) 1 %-1:200000 IJ SOLN
INTRAMUSCULAR | Status: DC | PRN
Start: 2022-02-27 — End: 2022-02-27
  Administered 2022-02-27: 9 mL

## 2022-02-27 MED ORDER — AMISULPRIDE (ANTIEMETIC) 5 MG/2ML IV SOLN: 10.0000 mg | Freq: Once | INTRAVENOUS | Status: DC | PRN

## 2022-02-27 MED ORDER — ORAL CARE MOUTH RINSE: 15.0000 mL | Freq: Once | OROMUCOSAL | Status: AC

## 2022-02-27 MED ORDER — SUCCINYLCHOLINE CHLORIDE 200 MG/10ML IV SOSY
PREFILLED_SYRINGE | INTRAVENOUS | Status: AC
  Filled 2022-02-27: qty 10

## 2022-02-27 MED ORDER — CHLORHEXIDINE GLUCONATE 0.12 % MT SOLN
OROMUCOSAL | Status: AC
  Administered 2022-02-27: 15 mL via OROMUCOSAL
  Filled 2022-02-27: qty 15

## 2022-02-27 MED ORDER — LIDOCAINE 2% (20 MG/ML) 5 ML SYRINGE
INTRAMUSCULAR | Status: AC
  Filled 2022-02-27: qty 5

## 2022-02-27 MED ORDER — HEPARIN 6000 UNIT IRRIGATION SOLUTION
Status: AC
  Filled 2022-02-27: qty 500

## 2022-02-27 MED ORDER — OXYCODONE HCL 5 MG PO TABS: 5.0000 mg | ORAL_TABLET | Freq: Once | ORAL | Status: DC | PRN

## 2022-02-27 MED ORDER — ACETAMINOPHEN 500 MG PO TABS: 1000.0000 mg | ORAL_TABLET | Freq: Once | ORAL | Status: AC

## 2022-02-27 MED ORDER — ACETAMINOPHEN 500 MG PO TABS
ORAL_TABLET | ORAL | Status: AC
  Administered 2022-02-27: 1000 mg via ORAL
  Filled 2022-02-27: qty 2

## 2022-02-27 MED ORDER — CHLORHEXIDINE GLUCONATE 4 % EX LIQD: 60.0000 mL | Freq: Once | CUTANEOUS | Status: DC

## 2022-02-27 MED ORDER — MIDAZOLAM HCL 2 MG/2ML IJ SOLN
INTRAMUSCULAR | Status: AC
  Filled 2022-02-27: qty 2

## 2022-02-27 MED ORDER — HEPARIN 6000 UNIT IRRIGATION SOLUTION
Status: DC | PRN
  Administered 2022-02-27: 1

## 2022-02-27 MED ORDER — LIDOCAINE HCL (PF) 1 % IJ SOLN
INTRAMUSCULAR | Status: AC
  Filled 2022-02-27: qty 30

## 2022-02-27 MED ORDER — CHLORHEXIDINE GLUCONATE 0.12 % MT SOLN: 15.0000 mL | Freq: Once | OROMUCOSAL | Status: AC

## 2022-02-27 MED ORDER — PROPOFOL 10 MG/ML IV BOLUS
INTRAVENOUS | Status: DC | PRN
  Administered 2022-02-27: 20 mg via INTRAVENOUS

## 2022-02-27 MED ORDER — LIDOCAINE 2% (20 MG/ML) 5 ML SYRINGE
INTRAMUSCULAR | Status: DC | PRN
  Administered 2022-02-27: 100 mg via INTRAVENOUS

## 2022-02-27 MED ORDER — 0.9 % SODIUM CHLORIDE (POUR BTL) OPTIME
TOPICAL | Status: DC | PRN
  Administered 2022-02-27: 1000 mL

## 2022-02-27 MED ORDER — PROPOFOL 500 MG/50ML IV EMUL
INTRAVENOUS | Status: DC | PRN
  Administered 2022-02-27: 50 ug/kg/min via INTRAVENOUS

## 2022-02-27 MED ORDER — CEFAZOLIN SODIUM-DEXTROSE 2-4 GM/100ML-% IV SOLN
INTRAVENOUS | Status: AC
  Filled 2022-02-27: qty 100

## 2022-02-27 MED ORDER — LIDOCAINE-EPINEPHRINE (PF) 1 %-1:200000 IJ SOLN
INTRAMUSCULAR | Status: AC
  Filled 2022-02-27: qty 30

## 2022-02-27 MED ORDER — FENTANYL CITRATE (PF) 250 MCG/5ML IJ SOLN
INTRAMUSCULAR | Status: DC | PRN
  Administered 2022-02-27 (×2): 50 ug via INTRAVENOUS

## 2022-02-27 MED ORDER — CEFAZOLIN SODIUM-DEXTROSE 2-4 GM/100ML-% IV SOLN
2.0000 g | INTRAVENOUS | Status: AC
  Administered 2022-02-27: 2 g via INTRAVENOUS

## 2022-02-27 MED ORDER — SODIUM CHLORIDE 0.9 % IV SOLN: INTRAVENOUS | Status: DC

## 2022-02-27 MED ORDER — OXYCODONE-ACETAMINOPHEN 5-325 MG PO TABS: 1.0000 | ORAL_TABLET | Freq: Four times a day (QID) | ORAL | 0 refills | Status: DC | PRN

## 2022-02-27 MED ORDER — LACTATED RINGERS IV SOLN: INTRAVENOUS | Status: DC

## 2022-02-27 SURGICAL SUPPLY — 31 items
BAG COUNTER SPONGE SURGICOUNT (BAG) ×2 IMPLANT
BNDG ELASTIC 4X5.8 VLCR STR LF (GAUZE/BANDAGES/DRESSINGS) ×1 IMPLANT
BNDG ELASTIC 6X5.8 VLCR STR LF (GAUZE/BANDAGES/DRESSINGS) ×1 IMPLANT
CANISTER SUCT 3000ML PPV (MISCELLANEOUS) ×2 IMPLANT
CLIP LIGATING EXTRA MED SLVR (CLIP) ×2 IMPLANT
CLIP LIGATING EXTRA SM BLUE (MISCELLANEOUS) ×2 IMPLANT
DERMABOND ADVANCED (GAUZE/BANDAGES/DRESSINGS) ×1
DERMABOND ADVANCED .7 DNX12 (GAUZE/BANDAGES/DRESSINGS) ×1 IMPLANT
ELECT REM PT RETURN 9FT ADLT (ELECTROSURGICAL) ×2
ELECTRODE REM PT RTRN 9FT ADLT (ELECTROSURGICAL) ×1 IMPLANT
GAUZE 4X4 16PLY ~~LOC~~+RFID DBL (SPONGE) ×1 IMPLANT
GLOVE SURG ENC MOIS LTX SZ7.5 (GLOVE) ×2 IMPLANT
GOWN STRL REUS W/ TWL LRG LVL3 (GOWN DISPOSABLE) ×2 IMPLANT
GOWN STRL REUS W/ TWL XL LVL3 (GOWN DISPOSABLE) ×1 IMPLANT
GOWN STRL REUS W/TWL LRG LVL3 (GOWN DISPOSABLE) ×4
GOWN STRL REUS W/TWL XL LVL3 (GOWN DISPOSABLE) ×2
KIT BASIN OR (CUSTOM PROCEDURE TRAY) ×2 IMPLANT
KIT TURNOVER KIT B (KITS) ×2 IMPLANT
NS IRRIG 1000ML POUR BTL (IV SOLUTION) ×2 IMPLANT
PACK CV ACCESS (CUSTOM PROCEDURE TRAY) ×2 IMPLANT
PAD ARMBOARD 7.5X6 YLW CONV (MISCELLANEOUS) ×4 IMPLANT
SPONGE T-LAP 18X18 ~~LOC~~+RFID (SPONGE) ×1 IMPLANT
SUT ETHILON 3 0 PS 1 (SUTURE) IMPLANT
SUT MNCRL AB 4-0 PS2 18 (SUTURE) ×2 IMPLANT
SUT PROLENE 6 0 BV (SUTURE) ×2 IMPLANT
SUT SILK 0 TIES 10X30 (SUTURE) ×2 IMPLANT
SUT VIC AB 3-0 SH 27 (SUTURE) ×2
SUT VIC AB 3-0 SH 27X BRD (SUTURE) ×1 IMPLANT
TOWEL GREEN STERILE (TOWEL DISPOSABLE) ×2 IMPLANT
UNDERPAD 30X36 HEAVY ABSORB (UNDERPADS AND DIAPERS) ×2 IMPLANT
WATER STERILE IRR 1000ML POUR (IV SOLUTION) ×2 IMPLANT

## 2022-02-27 NOTE — Op Note (Signed)
? ? ?  Patient name: Devin Harmon MRN: [id number] DOB: 21-Oct-1956 Sex: male ? ?02/27/2022 ?Pre-operative Diagnosis: End-stage renal disease left upper extremity edema secondary to AV fistula ?Post-operative diagnosis:  Same ?Surgeon:  Eda Paschal. Donzetta Matters, MD ?Assistant: Clydell Hakim, MS3 ?Procedure Performed:   Ligation left arm AV fistula ? ?Indications: 66 year old male with end-stage renal disease has a two-stage basilic vein fistula in his left upper extremity and now has severe edema.  He likely has central stenosis but given his underlying disability we have elected for ligation of the AV fistula. ? ?  ?Procedure:  The patient was identified in the holding area and taken to the operating room where he was placed supine operative table and MAC anesthesia was induced.  He was sterilely prepped and draped in the left upper extremity usual fashion, antibiotics were minister timeout was called.  Ultrasound was used identify the fistula in the mid upper arm where it was very superficial.  I did press out some of the edema in this location down to where the fistula was readily visible.  The area was then anesthetized with 1% lidocaine and a transverse incision was made.  The fistula was dissected out and encircled this first with a 2-0 silk tie and tied off distally after clamping it proximally.  It was then transected.  I oversewed it proximally with running 6-0 Prolene in a mattress fashion finished with a baseball stitch.  At completion it was hemostatic.  I irrigated the wound and closed in layers of Vicryl and Monocryl.  Dermabond is placed at the skin level.  The arm was then cleaned and dried and wrapped with Ace bandage and patient was placed in a sling.  He was awakened from anesthesia having tolerated procedure without any complication.  All counts were correct at completion. ? ?EBL: 10 cc ? ? ?Trachelle Low C. Donzetta Matters, MD ?[facility] ?Office: [phone] ?Pager: [phone] ? ? ?

## 2022-02-27 NOTE — Anesthesia Postprocedure Evaluation (Signed)
Anesthesia Post Note ? ?Patient: Devin Harmon ? ?Procedure(s) Performed: LIGATION OF LEFT ARM FISTULA (Left) ? ?  ? ?Patient location during evaluation: PACU ?Anesthesia Type: MAC ?Level of consciousness: awake and alert ?Pain management: pain level controlled ?Vital Signs Assessment: post-procedure vital signs reviewed and stable ?Respiratory status: spontaneous breathing, nonlabored ventilation and respiratory function stable ?Cardiovascular status: blood pressure returned to baseline and stable ?Postop Assessment: no apparent nausea or vomiting ?Anesthetic complications: no ? ? ?No notable events documented. ? ?Last Vitals:  ?Vitals:  ? 02/27/22 0900 02/27/22 0907  ?BP:  (!) 152/86  ?Pulse: (!) 58 61  ?Resp: (!) 21 (!) 22  ?Temp:    ?SpO2: 94% 96%  ?  ?Last Pain:  ?Vitals:  ? 02/27/22 0652  ?TempSrc: Oral  ?PainSc:   ? ? ?  ?  ?  ?  ?  ?  ? ?Lidia Collum ? ? ? ? ?

## 2022-02-27 NOTE — Interval H&P Note (Signed)
History and Physical Interval Note: ? ?02/27/2022 ?7:23 AM ? ?Devin Harmon  has presented today for surgery, with the diagnosis of ESRD.  The various methods of treatment have been discussed with the patient and family. After consideration of risks, benefits and other options for treatment, the patient has consented to  Procedure(s): ?LIGATION OF LEFT ARM FISTULA (Left) as a surgical intervention.  The patient's history has been reviewed, patient examined, no change in status, stable for surgery.  I have reviewed the patient's chart and labs.  Questions were answered to the patient's satisfaction.   ? ? ?[name] ? ? ?

## 2022-02-27 NOTE — Discharge Instructions (Signed)
? ?  [facility] ? ?Discharge Instructions ? ?AV Fistula or Graft Surgery for Dialysis Access ? ?Please refer to the following instructions for your post-procedure care. Your surgeon or physician assistant will discuss any changes with you. ? ?Activity ? ?You may drive the day following your surgery, if you are comfortable and no longer taking prescription pain medication. Resume full activity as the soreness in your incision resolves. ? ?Bathing/Showering ? ?You may shower after you go home. Keep your incision dry for 48 hours. Do not soak in a bathtub, hot tub, or swim until the incision heals completely. You may not shower if you have a hemodialysis catheter. ? ?Incision Care ? ?Clean your incision with mild soap and water after 48 hours. Pat the area dry with a clean towel. You do not need a bandage unless otherwise instructed. Do not apply any ointments or creams to your incision. You may have skin glue on your incision. Do not peel it off. It will come off on its own in about one week. Your arm may swell a bit after surgery. To reduce swelling use pillows to elevate your arm so it is above your heart. Your doctor will tell you if you need to lightly wrap your arm with an ACE bandage. ? ?Diet ? ?Resume your normal diet. There are not special food restrictions following this procedure. In order to heal from your surgery, it is CRITICAL to get adequate nutrition. Your body requires vitamins, minerals, and protein. Vegetables are the best source of vitamins and minerals. Vegetables also provide the perfect balance of protein. Processed food has little nutritional value, so try to avoid this. ? ?Medications ? ?Resume taking all of your medications. If your incision is causing pain, you may take over-the counter pain relievers such as acetaminophen (Tylenol). If you were prescribed a stronger pain medication, please be aware these medications can cause nausea and constipation. Prevent  nausea by taking the medication with a snack or meal. Avoid constipation by drinking plenty of fluids and eating foods with high amount of fiber, such as fruits, vegetables, and grains.  ? ?Do not take Tylenol if you are taking prescription pain medications. ? ?Follow up ?Your surgeon may want to see you in the office following your access surgery. If so, this will be arranged at the time of your surgery. ? ?Please call us immediately for any of the following conditions: ? ?Increased pain, redness, drainage (pus) from your incision site ?Fever of 101 degrees or higher ?Severe or worsening pain at your incision site ?Hand pain or numbness. ? ?Reduce your risk of vascular disease: ? ?Stop smoking. If you would like help, call [facility] at 1-800-QUIT-NOW 386-159-4772) or [place] at 2237382571 ? ?Manage your cholesterol ?Maintain a desired weight ?Control your diabetes ?Keep your blood pressure down ? ?Dialysis ? ?It will take several weeks to several months for your new dialysis access to be ready for use. Your surgeon will determine when it is okay to use it. Your nephrologist will continue to direct your dialysis. You can continue to use your Permcath until your new access is ready for use. ? ? ?02/27/2022 ?Devin Harmon ?[id number] ?[birth year] ? ?Surgeon(s): ?Waynetta Sandy, MD ? ?Procedure(s): ?LIGATION OF LEFT ARM FISTULA ? ?Patient will continue to use Tunneled Dialysis Catheter for Hemodialysis ? ?If you have any questions, please call the office at [phone]. ?

## 2022-02-27 NOTE — Transfer of Care (Signed)
Immediate Anesthesia Transfer of Care Note ? ?Patient: Devin Harmon ? ?Procedure(s) Performed: LIGATION OF LEFT ARM FISTULA (Left) ? ?Patient Location: PACU ? ?Anesthesia Type:MAC ? ?Level of Consciousness: drowsy and patient cooperative ? ?Airway & Oxygen Therapy: Patient Spontanous Breathing ? ?Post-op Assessment: Report given to RN and Post -op Vital signs reviewed and stable ? ?Post vital signs: Reviewed and stable ? ?Last Vitals:  ?Vitals Value Taken Time  ?BP    ?Temp    ?Pulse    ?Resp    ?SpO2    ? ? ?Last Pain:  ?Vitals:  ? 02/27/22 0652  ?TempSrc: Oral  ?PainSc:   ?   ? ?  ? ?Complications: No notable events documented. ?

## 2022-02-28 ENCOUNTER — Encounter (HOSPITAL_COMMUNITY): Payer: Self-pay | Admitting: Vascular Surgery

## 2022-03-14 ENCOUNTER — Telehealth: Payer: Self-pay | Admitting: *Deleted

## 2022-03-14 NOTE — Telephone Encounter (Signed)
Wife called asking to cancel patient's follow up appointment scheduled 03/19/2022 with Dr Donzetta Matters. She states that Dr Donzetta Matters told her to cancel because everything looked "good" and appointment was not needed. ?Dr Donzetta Matters was not available to verify.  ?

## 2022-04-14 ENCOUNTER — Inpatient Hospital Stay (HOSPITAL_COMMUNITY): Payer: Medicare Other

## 2022-04-14 ENCOUNTER — Inpatient Hospital Stay (HOSPITAL_COMMUNITY)
Admission: EM | Admit: 2022-04-14 | Discharge: 2022-04-20 | DRG: 208 | Disposition: A | Discharge status: Home / Self Care | Payer: Medicare Other | Attending: Internal Medicine | Admitting: Internal Medicine

## 2022-04-14 ENCOUNTER — Encounter (HOSPITAL_COMMUNITY): Payer: Self-pay | Admitting: Emergency Medicine

## 2022-04-14 ENCOUNTER — Emergency Department (HOSPITAL_COMMUNITY): Payer: Medicare Other

## 2022-04-14 ENCOUNTER — Other Ambulatory Visit: Payer: Self-pay

## 2022-04-14 DIAGNOSIS — F0154 Vascular dementia, unspecified severity, with anxiety: Secondary | ICD-10-CM | POA: Diagnosis not present

## 2022-04-14 DIAGNOSIS — Z882 Allergy status to sulfonamides status: Secondary | ICD-10-CM | POA: Diagnosis not present

## 2022-04-14 DIAGNOSIS — J90 Pleural effusion, not elsewhere classified: Secondary | ICD-10-CM

## 2022-04-14 DIAGNOSIS — L89132 Pressure ulcer of right lower back, stage 2: Secondary | ICD-10-CM | POA: Diagnosis not present

## 2022-04-14 DIAGNOSIS — I132 Hypertensive heart and chronic kidney disease with heart failure and with stage 5 chronic kidney disease, or end stage renal disease: Secondary | ICD-10-CM | POA: Diagnosis present

## 2022-04-14 DIAGNOSIS — Z6821 Body mass index (BMI) 21.0-21.9, adult: Secondary | ICD-10-CM | POA: Diagnosis not present

## 2022-04-14 DIAGNOSIS — G9340 Encephalopathy, unspecified: Secondary | ICD-10-CM | POA: Insufficient documentation

## 2022-04-14 DIAGNOSIS — L8990 Pressure ulcer of unspecified site, unspecified stage: Secondary | ICD-10-CM | POA: Insufficient documentation

## 2022-04-14 DIAGNOSIS — D631 Anemia in chronic kidney disease: Secondary | ICD-10-CM | POA: Diagnosis not present

## 2022-04-14 DIAGNOSIS — Z9911 Dependence on respirator [ventilator] status: Secondary | ICD-10-CM | POA: Diagnosis not present

## 2022-04-14 DIAGNOSIS — J9601 Acute respiratory failure with hypoxia: Secondary | ICD-10-CM | POA: Diagnosis not present

## 2022-04-14 DIAGNOSIS — I5042 Chronic combined systolic (congestive) and diastolic (congestive) heart failure: Secondary | ICD-10-CM | POA: Diagnosis present

## 2022-04-14 DIAGNOSIS — N400 Enlarged prostate without lower urinary tract symptoms: Secondary | ICD-10-CM | POA: Diagnosis present

## 2022-04-14 DIAGNOSIS — Z8249 Family history of ischemic heart disease and other diseases of the circulatory system: Secondary | ICD-10-CM | POA: Diagnosis not present

## 2022-04-14 DIAGNOSIS — Z79899 Other long term (current) drug therapy: Secondary | ICD-10-CM

## 2022-04-14 DIAGNOSIS — N2581 Secondary hyperparathyroidism of renal origin: Secondary | ICD-10-CM | POA: Diagnosis present

## 2022-04-14 DIAGNOSIS — Z20822 Contact with and (suspected) exposure to covid-19: Secondary | ICD-10-CM | POA: Diagnosis not present

## 2022-04-14 DIAGNOSIS — J189 Pneumonia, unspecified organism: Secondary | ICD-10-CM | POA: Diagnosis not present

## 2022-04-14 DIAGNOSIS — Z992 Dependence on renal dialysis: Secondary | ICD-10-CM | POA: Diagnosis not present

## 2022-04-14 DIAGNOSIS — Z7189 Other specified counseling: Secondary | ICD-10-CM

## 2022-04-14 DIAGNOSIS — Z66 Do not resuscitate: Secondary | ICD-10-CM | POA: Diagnosis not present

## 2022-04-14 DIAGNOSIS — N186 End stage renal disease: Secondary | ICD-10-CM

## 2022-04-14 DIAGNOSIS — Y95 Nosocomial condition: Secondary | ICD-10-CM | POA: Diagnosis present

## 2022-04-14 DIAGNOSIS — G9341 Metabolic encephalopathy: Secondary | ICD-10-CM | POA: Diagnosis present

## 2022-04-14 DIAGNOSIS — E43 Unspecified severe protein-calorie malnutrition: Secondary | ICD-10-CM | POA: Insufficient documentation

## 2022-04-14 DIAGNOSIS — J9691 Respiratory failure, unspecified with hypoxia: Secondary | ICD-10-CM | POA: Diagnosis present

## 2022-04-14 DIAGNOSIS — Z885 Allergy status to narcotic agent status: Secondary | ICD-10-CM | POA: Diagnosis not present

## 2022-04-14 DIAGNOSIS — J301 Allergic rhinitis due to pollen: Secondary | ICD-10-CM | POA: Diagnosis present

## 2022-04-14 DIAGNOSIS — Z888 Allergy status to other drugs, medicaments and biological substances status: Secondary | ICD-10-CM | POA: Diagnosis not present

## 2022-04-14 DIAGNOSIS — I5043 Acute on chronic combined systolic (congestive) and diastolic (congestive) heart failure: Secondary | ICD-10-CM | POA: Insufficient documentation

## 2022-04-14 LAB — CBC WITH DIFFERENTIAL/PLATELET
Abs Immature Granulocytes: 0 10*3/uL (ref 0.00–0.07)
Basophils Absolute: 0 10*3/uL (ref 0.0–0.1)
Basophils Relative: 0 %
Eosinophils Absolute: 0 10*3/uL (ref 0.0–0.5)
Eosinophils Relative: 0 %
HCT: 36.2 % — ABNORMAL LOW (ref 39.0–52.0)
Hemoglobin: 11.2 g/dL — ABNORMAL LOW (ref 13.0–17.0)
Lymphocytes Relative: 3 %
Lymphs Abs: 0.3 10*3/uL — ABNORMAL LOW (ref 0.7–4.0)
MCH: 29.3 pg (ref 26.0–34.0)
MCHC: 30.9 g/dL (ref 30.0–36.0)
MCV: 94.8 fL (ref 80.0–100.0)
Monocytes Absolute: 0.4 10*3/uL (ref 0.1–1.0)
Monocytes Relative: 4 %
Neutro Abs: 9.5 10*3/uL — ABNORMAL HIGH (ref 1.7–7.7)
Neutrophils Relative %: 93 %
Platelets: 253 10*3/uL (ref 150–400)
RBC: 3.82 MIL/uL — ABNORMAL LOW (ref 4.22–5.81)
RDW: 21.2 % — ABNORMAL HIGH (ref 11.5–15.5)
WBC: 10.2 10*3/uL (ref 4.0–10.5)
nRBC: 0.5 % — ABNORMAL HIGH (ref 0.0–0.2)
nRBC: 2 /100 WBC — ABNORMAL HIGH

## 2022-04-14 LAB — GLUCOSE, CAPILLARY
Glucose-Capillary: 94 mg/dL (ref 70–99)
Glucose-Capillary: 94 mg/dL (ref 70–99)
Glucose-Capillary: 95 mg/dL (ref 70–99)
Glucose-Capillary: 90 mg/dL (ref 70–99)

## 2022-04-14 LAB — I-STAT ARTERIAL BLOOD GAS, ED
Acid-Base Excess: 2 mmol/L (ref 0.0–2.0)
Bicarbonate: 27 mmol/L (ref 20.0–28.0)
Calcium, Ion: 1.09 mmol/L — ABNORMAL LOW (ref 1.15–1.40)
HCT: 35 % — ABNORMAL LOW (ref 39.0–52.0)
Hemoglobin: 11.9 g/dL — ABNORMAL LOW (ref 13.0–17.0)
O2 Saturation: 100 %
Patient temperature: 97.7
Potassium: 4.7 mmol/L (ref 3.5–5.1)
Sodium: 140 mmol/L (ref 135–145)
TCO2: 28 mmol/L (ref 22–32)
pCO2 arterial: 41.9 mmHg (ref 32–48)
pH, Arterial: 7.415 (ref 7.35–7.45)
pO2, Arterial: 165 mmHg — ABNORMAL HIGH (ref 83–108)

## 2022-04-14 LAB — COMPREHENSIVE METABOLIC PANEL
ALT: 20 U/L (ref 0–44)
AST: 20 U/L (ref 15–41)
Albumin: 3.6 g/dL (ref 3.5–5.0)
Alkaline Phosphatase: 85 U/L (ref 38–126)
Anion gap: 17 — ABNORMAL HIGH (ref 5–15)
BUN: 67 mg/dL — ABNORMAL HIGH (ref 8–23)
CO2: 23 mmol/L (ref 22–32)
Calcium: 9 mg/dL (ref 8.9–10.3)
Chloride: 102 mmol/L (ref 98–111)
Creatinine, Ser: 10.69 mg/dL — ABNORMAL HIGH (ref 0.61–1.24)
GFR, Estimated: 5 mL/min — ABNORMAL LOW (ref >60)
Glucose, Bld: 115 mg/dL — ABNORMAL HIGH (ref 70–99)
Potassium: 5 mmol/L (ref 3.5–5.1)
Sodium: 142 mmol/L (ref 135–145)
Total Bilirubin: 0.6 mg/dL (ref 0.3–1.2)
Total Protein: 6.9 g/dL (ref 6.5–8.1)

## 2022-04-14 LAB — PROTIME-INR
INR: 1.2 (ref 0.8–1.2)
Prothrombin Time: 15.5 seconds — ABNORMAL HIGH (ref 11.4–15.2)

## 2022-04-14 LAB — PHOSPHORUS: Phosphorus: 9.9 mg/dL — ABNORMAL HIGH (ref 2.5–4.6)

## 2022-04-14 LAB — MRSA NEXT GEN BY PCR, NASAL: MRSA by PCR Next Gen: NOT DETECTED

## 2022-04-14 LAB — APTT: aPTT: 31 seconds (ref 24–36)

## 2022-04-14 LAB — HIV ANTIBODY (ROUTINE TESTING W REFLEX): HIV Screen 4th Generation wRfx: NONREACTIVE

## 2022-04-14 LAB — PROCALCITONIN: Procalcitonin: 1.87 ng/mL

## 2022-04-14 LAB — AMMONIA: Ammonia: 38 umol/L — ABNORMAL HIGH (ref 9–35)

## 2022-04-14 LAB — RESP PANEL BY RT-PCR (FLU A&B, COVID) ARPGX2
Influenza A by PCR: NEGATIVE
Influenza B by PCR: NEGATIVE
SARS Coronavirus 2 by RT PCR: NEGATIVE

## 2022-04-14 LAB — BRAIN NATRIURETIC PEPTIDE: B Natriuretic Peptide: >4500 pg/mL — ABNORMAL HIGH (ref 0.0–100.0)

## 2022-04-14 LAB — TSH: TSH: 3.261 u[IU]/mL (ref 0.350–4.500)

## 2022-04-14 LAB — LACTIC ACID, PLASMA: Lactic Acid, Venous: 1.6 mmol/L (ref 0.5–1.9)

## 2022-04-14 LAB — CBG MONITORING, ED: Glucose-Capillary: 107 mg/dL — ABNORMAL HIGH (ref 70–99)

## 2022-04-14 MED ORDER — PANTOPRAZOLE SODIUM 40 MG IV SOLR: 40.0000 mg | Freq: Every day | INTRAVENOUS | Status: DC

## 2022-04-14 MED ORDER — FENTANYL CITRATE PF 50 MCG/ML IJ SOSY: 25.0000 ug | PREFILLED_SYRINGE | Freq: Once | INTRAMUSCULAR | Status: DC

## 2022-04-14 MED ORDER — DEXTROSE 5 % IV SOLN
250.0000 mg | INTRAVENOUS | Status: AC
  Administered 2022-04-15 – 2022-04-18 (×4): 250 mg via INTRAVENOUS
  Filled 2022-04-14 (×5): qty 2.5

## 2022-04-14 MED ORDER — POLYETHYLENE GLYCOL 3350 17 G PO PACK: 17.0000 g | PACK | Freq: Every day | ORAL | Status: DC | PRN

## 2022-04-14 MED ORDER — ROCURONIUM BROMIDE 50 MG/5ML IV SOLN
60.0000 mg | Freq: Once | INTRAVENOUS | Status: AC
  Administered 2022-04-14: 60 mg via INTRAVENOUS
  Filled 2022-04-14: qty 6

## 2022-04-14 MED ORDER — ALBUTEROL SULFATE (2.5 MG/3ML) 0.083% IN NEBU
2.5000 mg | INHALATION_SOLUTION | RESPIRATORY_TRACT | Status: DC | PRN
Start: 2022-04-14 — End: 2022-04-20
  Administered 2022-04-19: 2.5 mg via RESPIRATORY_TRACT
  Filled 2022-04-14: qty 3

## 2022-04-14 MED ORDER — MIDAZOLAM HCL 2 MG/2ML IJ SOLN
1.0000 mg | INTRAMUSCULAR | Status: DC | PRN
  Administered 2022-04-14: 1 mg via INTRAVENOUS
  Filled 2022-04-14: qty 2

## 2022-04-14 MED ORDER — MIDAZOLAM HCL 2 MG/2ML IJ SOLN: 1.0000 mg | INTRAMUSCULAR | Status: DC | PRN

## 2022-04-14 MED ORDER — DOCUSATE SODIUM 100 MG PO CAPS: 100.0000 mg | ORAL_CAPSULE | Freq: Two times a day (BID) | ORAL | Status: DC | PRN

## 2022-04-14 MED ORDER — ETOMIDATE 2 MG/ML IV SOLN
20.0000 mg | Freq: Once | INTRAVENOUS | Status: AC
  Administered 2022-04-14: 20 mg via INTRAVENOUS

## 2022-04-14 MED ORDER — SODIUM CHLORIDE 0.9 % IV SOLN
1.0000 g | Freq: Every day | INTRAVENOUS | Status: AC
  Administered 2022-04-14 – 2022-04-19 (×5): 1 g via INTRAVENOUS
  Filled 2022-04-14 (×6): qty 10

## 2022-04-14 MED ORDER — FENTANYL CITRATE PF 50 MCG/ML IJ SOSY
25.0000 ug | PREFILLED_SYRINGE | INTRAMUSCULAR | Status: DC | PRN
  Filled 2022-04-14: qty 2

## 2022-04-14 MED ORDER — SODIUM CHLORIDE 0.9 % IV SOLN
500.0000 mg | INTRAVENOUS | Status: DC
  Administered 2022-04-14: 500 mg via INTRAVENOUS
  Filled 2022-04-14: qty 5

## 2022-04-14 MED ORDER — VANCOMYCIN HCL 1500 MG/300ML IV SOLN
1500.0000 mg | Freq: Once | INTRAVENOUS | Status: AC
  Administered 2022-04-14: 1500 mg via INTRAVENOUS
  Filled 2022-04-14 (×3): qty 300

## 2022-04-14 MED ORDER — POLYETHYLENE GLYCOL 3350 17 G PO PACK
17.0000 g | PACK | Freq: Every day | ORAL | Status: DC
  Administered 2022-04-15 – 2022-04-16 (×2): 17 g
  Filled 2022-04-14 (×2): qty 1

## 2022-04-14 MED ORDER — CHLORHEXIDINE GLUCONATE CLOTH 2 % EX PADS
6.0000 | MEDICATED_PAD | Freq: Every day | CUTANEOUS | Status: DC
  Administered 2022-04-15 – 2022-04-20 (×6): 6 via TOPICAL

## 2022-04-14 MED ORDER — FENTANYL CITRATE PF 50 MCG/ML IJ SOSY
50.0000 ug | PREFILLED_SYRINGE | Freq: Once | INTRAMUSCULAR | Status: AC
  Administered 2022-04-14: 50 ug via INTRAVENOUS

## 2022-04-14 MED ORDER — CHLORHEXIDINE GLUCONATE 0.12% ORAL RINSE (MEDLINE KIT)
15.0000 mL | Freq: Two times a day (BID) | OROMUCOSAL | Status: DC
  Administered 2022-04-14 – 2022-04-17 (×6): 15 mL via OROMUCOSAL

## 2022-04-14 MED ORDER — PROSOURCE TF PO LIQD
45.0000 mL | Freq: Two times a day (BID) | ORAL | Status: DC
  Administered 2022-04-14 – 2022-04-15 (×3): 45 mL
  Filled 2022-04-14 (×3): qty 45

## 2022-04-14 MED ORDER — VITAL HIGH PROTEIN PO LIQD
1000.0000 mL | ORAL | Status: DC
  Administered 2022-04-14: 1000 mL

## 2022-04-14 MED ORDER — DARBEPOETIN ALFA 200 MCG/0.4ML IJ SOSY
200.0000 ug | PREFILLED_SYRINGE | INTRAMUSCULAR | Status: DC
  Administered 2022-04-19: 200 ug via INTRAVENOUS
  Filled 2022-04-14 (×2): qty 0.4

## 2022-04-14 MED ORDER — FENTANYL CITRATE PF 50 MCG/ML IJ SOSY
PREFILLED_SYRINGE | INTRAMUSCULAR | Status: AC
  Administered 2022-04-14: 100 ug via INTRAVENOUS
  Filled 2022-04-14: qty 1

## 2022-04-14 MED ORDER — HEPARIN SODIUM (PORCINE) 5000 UNIT/ML IJ SOLN
5000.0000 [IU] | Freq: Three times a day (TID) | INTRAMUSCULAR | Status: DC
  Administered 2022-04-14 – 2022-04-20 (×18): 5000 [IU] via SUBCUTANEOUS
  Filled 2022-04-14 (×18): qty 1

## 2022-04-14 MED ORDER — DOCUSATE SODIUM 50 MG/5ML PO LIQD
100.0000 mg | Freq: Two times a day (BID) | ORAL | Status: DC
  Administered 2022-04-14 – 2022-04-17 (×6): 100 mg
  Filled 2022-04-14 (×6): qty 10

## 2022-04-14 MED ORDER — FENTANYL BOLUS VIA INFUSION
25.0000 ug | INTRAVENOUS | Status: DC | PRN
  Administered 2022-04-14: 100 ug via INTRAVENOUS
  Administered 2022-04-14: 50 ug via INTRAVENOUS
  Administered 2022-04-14: 100 ug via INTRAVENOUS
  Administered 2022-04-14: 50 ug via INTRAVENOUS
  Administered 2022-04-15: 100 ug via INTRAVENOUS
  Administered 2022-04-15: 50 ug via INTRAVENOUS
  Administered 2022-04-15: 100 ug via INTRAVENOUS
  Administered 2022-04-15 (×2): 50 ug via INTRAVENOUS
  Administered 2022-04-16 (×2): 100 ug via INTRAVENOUS

## 2022-04-14 MED ORDER — ORAL CARE MOUTH RINSE
15.0000 mL | OROMUCOSAL | Status: DC
  Administered 2022-04-14 – 2022-04-17 (×26): 15 mL via OROMUCOSAL

## 2022-04-14 MED ORDER — VANCOMYCIN HCL 500 MG/100ML IV SOLN: 500.0000 mg | INTRAVENOUS | Status: DC

## 2022-04-14 MED ORDER — PANTOPRAZOLE SODIUM 40 MG IV SOLR
40.0000 mg | Freq: Every day | INTRAVENOUS | Status: AC
  Administered 2022-04-14: 40 mg via INTRAVENOUS
  Filled 2022-04-14: qty 10

## 2022-04-14 MED ORDER — HEPARIN SODIUM (PORCINE) 1000 UNIT/ML DIALYSIS
1500.0000 [IU] | INTRAMUSCULAR | Status: AC | PRN
  Administered 2022-04-15 (×2): 1500 [IU] via INTRAVENOUS_CENTRAL
  Filled 2022-04-14: qty 2

## 2022-04-14 MED ORDER — SODIUM CHLORIDE 0.9 % IV SOLN
1.0000 g | INTRAVENOUS | Status: DC
  Filled 2022-04-14: qty 10

## 2022-04-14 MED ORDER — FENTANYL CITRATE PF 50 MCG/ML IJ SOSY: 25.0000 ug | PREFILLED_SYRINGE | INTRAMUSCULAR | Status: DC | PRN

## 2022-04-14 MED ORDER — CALCIUM GLUCONATE-NACL 1-0.675 GM/50ML-% IV SOLN
1.0000 g | Freq: Once | INTRAVENOUS | Status: AC
  Administered 2022-04-14: 1000 mg via INTRAVENOUS
  Filled 2022-04-14: qty 50

## 2022-04-14 MED ORDER — PANTOPRAZOLE 2 MG/ML SUSPENSION
40.0000 mg | Freq: Every day | ORAL | Status: DC
  Administered 2022-04-15 – 2022-04-16 (×2): 40 mg
  Filled 2022-04-14 (×2): qty 20

## 2022-04-14 MED ORDER — VANCOMYCIN VARIABLE DOSE PER UNSTABLE RENAL FUNCTION (PHARMACIST DOSING)
Status: DC
Start: 2022-04-14 — End: 2022-04-14

## 2022-04-14 MED ORDER — FENTANYL 2500MCG IN NS 250ML (10MCG/ML) PREMIX INFUSION
25.0000 ug/h | INTRAVENOUS | Status: DC
  Administered 2022-04-14: 25 ug/h via INTRAVENOUS
  Administered 2022-04-15: 50 ug/h via INTRAVENOUS
  Filled 2022-04-14 (×2): qty 250

## 2022-04-14 MED ORDER — SODIUM CHLORIDE 0.9 % IV SOLN
2.0000 g | INTRAVENOUS | Status: DC
  Administered 2022-04-14: 2 g via INTRAVENOUS
  Filled 2022-04-14: qty 20

## 2022-04-14 MED ORDER — DOCUSATE SODIUM 50 MG/5ML PO LIQD
100.0000 mg | Freq: Two times a day (BID) | ORAL | Status: DC | PRN
  Filled 2022-04-14: qty 10

## 2022-04-14 MED ORDER — LACTATED RINGERS IV SOLN: INTRAVENOUS | Status: DC

## 2022-04-14 NOTE — H&P (Incomplete)
? ?NAME:  Devin Harmon, MRN:  [id number], DOB:  September 15, 1956, LOS: 0 ?ADMISSION DATE:  04/14/2022, CONSULTATION DATE:  *** ?REFERRING MD:  ***, CHIEF COMPLAINT:  ***  ? ?History of Present Illness:  ?*** ? ?Pertinent  Medical History  ?*** ? ?Significant Hospital Events: ?Including procedures, antibiotic start and stop dates in addition to other pertinent events   ? ? ?Interim History / Subjective:  ?*** ? ?Objective   ?Blood pressure (!) 138/102, pulse 61, resp. rate 16, height 5\' 7"  (1.702 m), SpO2 100 %. ?   ?Vent Mode: PRVC ?FiO2 (%):  [80 %-100 %] 80 % ?Set Rate:  [16 bmp] 16 bmp ?Vt Set:  [550 mL] 550 mL ?PEEP:  [5 cmH20] 5 cmH20 ?Plateau Pressure:  [22 MGN00-37 cmH20] 22 cmH20  ?No intake or output data in the 24 hours ending 04/14/22 1102 ?There were no vitals filed for this visit. ? ?Examination: ?General: *** ?HENT: *** ?Lungs: *** ?Cardiovascular: *** ?Abdomen: *** ?Extremities: *** ?Neuro: *** ?GU: *** ? ?Resolved Hospital Problem list   ?*** ? ?Assessment & Plan:  ?*** ? ?Best Practice (right click and "Reselect all SmartList Selections" daily)  ? ?Diet/type: {diet type:25684} ?DVT prophylaxis: {anticoagulation (Optional):25687} ?GI prophylaxis: {CW:88891} ?Lines: {Central Venous Access:25771} ?Foley:  {Central Venous Access:25691} ?Code Status:  {Code Status:26939} ?Last date of multidisciplinary goals of care discussion [***] ? ?Labs   ?CBC: ?Recent Labs  ?Lab 04/14/22 ?0937  ?WBC 10.2  ?NEUTROABS 9.5*  ?HGB 11.2*  ?HCT 36.2*  ?MCV 94.8  ?PLT 253  ? ? ?Basic Metabolic Panel: ?Recent Labs  ?Lab 04/14/22 ?0937  ?NA 142  ?K 5.0  ?CL 102  ?CO2 23  ?GLUCOSE 115*  ?BUN 67*  ?CREATININE 10.69*  ?CALCIUM 9.0  ? ?GFR: ?CrCl cannot be calculated (Unknown ideal weight.). ?Recent Labs  ?Lab 04/14/22 ?0937  ?WBC 10.2  ? ? ?Liver Function Tests: ?Recent Labs  ?Lab 04/14/22 ?0937  ?AST 20  ?ALT 20  ?ALKPHOS 85  ?BILITOT 0.6  ?PROT 6.9  ?ALBUMIN 3.6  ? ?No results for input(s): LIPASE, AMYLASE in the last 168  hours. ?No results for input(s): AMMONIA in the last 168 hours. ? ?ABG ?   ?Component Value Date/Time  ? PHART 7.435 06/26/2020 2105  ? PCO2ART 36.6 06/26/2020 2105  ? PO2ART 58 (L) 06/26/2020 2105  ? HCO3 24.5 06/26/2020 2105  ? TCO2 25 02/27/2022 0658  ? ACIDBASEDEF 8.4 (H) 04/12/2019 1801  ? O2SAT 90.0 06/26/2020 2105  ?  ? ?Coagulation Profile: ?Recent Labs  ?Lab 04/14/22 ?0937  ?INR 1.2  ? ? ?Cardiac Enzymes: ?No results for input(s): CKTOTAL, CKMB, CKMBINDEX, TROPONINI in the last 168 hours. ? ?HbA1C: ?No results found for: HGBA1C ? ?CBG: ?Recent Labs  ?Lab 04/14/22 ?1005  ?GLUCAP 107*  ? ? ?Review of Systems:   ?*** ? ?Past Medical History:  ?He,  has a past medical history of Allergy, Anemia, BPH (benign prostatic hypertrophy), ESRD (end stage renal disease) ([place]), HFrEF (heart failure with reduced ejection fraction) ([place]) (06/27/2020), Hypertension, Leg weakness, Slurred speech (02/07/2020), and Zoster (2007).  ? ?Surgical History:  ? ?Past Surgical History:  ?Procedure Laterality Date  ? A/V FISTULAGRAM N/A 12/19/2019  ? Procedure: A/V FISTULAGRAM - Right Arm;  Surgeon: Waynetta Sandy, MD;  Location: [place] [facility];  Service: Cardiovascular;  Laterality: N/A;  ? A/V FISTULAGRAM Left 03/15/2020  ? Procedure: A/V FISTULAGRAM;  Surgeon: Marty Heck, MD;  Location: [place] [facility];  Service: Cardiovascular;  Laterality:  Left;  ? AV FISTULA PLACEMENT Right 09/01/2019  ? Procedure: ARTERIOVENOUS (AV) FISTULA CREATION RIGHT ARM;  Surgeon: Marty Heck, MD;  Location: [place];  Service: Vascular;  Laterality: Right;  ? AV FISTULA PLACEMENT Left 02/09/2020  ? Procedure: LEFT ARM BASILIC VEIN Arteriovenous FISTULA  CREATION;  Surgeon: Marty Heck, MD;  Location: [facility] [place];  Service: Vascular;  Laterality: Left;  ? BASCILIC VEIN TRANSPOSITION Left 05/17/2020  ? Procedure: LEFT ARM SECOND STAGE BASCILIC FISTULA;  Surgeon: Marty Heck, MD;  Location: [place];  Service: Vascular;   Laterality: Left;  ? DIALYSIS/PERMA CATHETER INSERTION N/A 04/15/2019  ? Procedure: DIALYSIS/PERMA CATHETER INSERTION;  Surgeon: Katha Cabal, MD;  Location: [facility] [facility];  Service: Cardiovascular;  Laterality: N/A;  ? INSERTION OF DIALYSIS CATHETER Left 11/15/2019  ? Procedure: INSERTION OF LEFT TUNNEL DIALYSIS CATHETER;  Surgeon: Waynetta Sandy, MD;  Location: [place];  Service: Vascular;  Laterality: Left;  ? LIGATION OF ARTERIOVENOUS  FISTULA Right 01/03/2020  ? Procedure: LIGATION OF ARTERIOVENOUS  FISTULA;  Surgeon: Serafina Mitchell, MD;  Location: [facility];  Service: Vascular;  Laterality: Right;  ? LIGATION OF ARTERIOVENOUS  FISTULA Left 02/27/2022  ? Procedure: LIGATION OF LEFT ARM FISTULA;  Surgeon: Waynetta Sandy, MD;  Location: [place];  Service: Vascular;  Laterality: Left;  ? PERIPHERAL VASCULAR BALLOON ANGIOPLASTY Left 03/15/2020  ? Procedure: PERIPHERAL VASCULAR BALLOON ANGIOPLASTY;  Surgeon: Marty Heck, MD;  Location: [place] [facility];  Service: Cardiovascular;  Laterality: Left;  UPPER ARM  ? PERIPHERAL VASCULAR INTERVENTION Right 12/19/2019  ? Procedure: PERIPHERAL VASCULAR INTERVENTION;  Surgeon: Waynetta Sandy, MD;  Location: [place] [facility];  Service: Cardiovascular;  Laterality: Right;  ? REMOVAL OF A DIALYSIS CATHETER Right 11/15/2019  ? Procedure: REMOVAL OF A RIGHT TUNNEL DIALYSIS CATHETER;  Surgeon: Waynetta Sandy, MD;  Location: [place];  Service: Vascular;  Laterality: Right;  ?  ? ?Social History:  ? reports that he has never smoked. He has never used smokeless tobacco. He reports that he does not currently use alcohol. He reports that he does not currently use drugs.  ? ?Family History:  ?His family history includes Cancer in his mother; Heart disease in his father; Hypertension in his brother and mother. There is no history of Coronary artery disease or Diabetes.  ? ?Allergies ?Allergies  ?Allergen Reactions  ? Sulfa  Antibiotics Hives  ? Sulfonamide Derivatives Hives  ? Chlorthalidone Other (See Comments)  ?  felt "bad" and ED ?  ? Hydrocodone Nausea Only  ?  dizzy  ?  ? ?Home Medications  ?Prior to Admission medications   ?Medication Sig Start Date End Date Taking? Authorizing Provider  ?acetaminophen (TYLENOL) 325 MG tablet Take 650 mg by mouth every 6 (six) hours as needed. Given during dialysis    [provider]  ?acetaminophen (TYLENOL) 500 MG tablet Take 500 mg by mouth every 6 (six) hours as needed.    [provider]  ?amLODipine (NORVASC) 10 MG tablet TAKE 1 TABLET BY MOUTH EVERY DAY 01/31/22   Venia Carbon, MD  ?AURYXIA 1 GM 210 MG(Fe) tablet Take 420 mg by mouth daily. 07/08/19   [provider]  ?B Complex-C-Folic Acid (RENA-VITE RX) 1 MG TABS Take 1 mg by mouth daily.    [provider]  ?cetirizine (ZYRTEC) 10 MG tablet Take 10 mg by mouth daily as needed for allergies.     [provider]  ?  hydrocortisone cream 1 % Apply 1 application topically daily as needed (rash).    [provider]  ?neomycin-bacitracin-polymyxin (NEOSPORIN) ointment Apply 1 application topically daily as needed for wound care.     [provider]  ?oxyCODONE-acetaminophen (PERCOCET) 5-325 MG tablet Take 1 tablet by mouth every 6 (six) hours as needed for severe pain. 02/27/22 02/27/23  Karoline Caldwell, PA-C  ?  ? ?Critical care time: *** ?  ? ? ? ?  ?

## 2022-04-14 NOTE — H&P (Signed)
? ?NAME:  Devin Harmon, MRN:  [id number], DOB:  05/24/1956, LOS: 0 ?ADMISSION DATE:  04/14/2022, CONSULTATION DATE:  04/14/22 ?REFERRING MD:  Regenia Skeeter CHIEF COMPLAINT:  Resp failure  ? ?History of Present Illness:  ?Devin Harmon is a 66 y.o. male who has a PMH as below including ESRD on HD MWF, last session Fri 5/12. He presented to [facility] ED 5/15 by EMS with reports of "feeling bad since Friday 5/12".  Family went and found him unresponsive on the couch in his home on morning of presentation.  On EMS arrival, he was minimally responsive and was hypoxic to the 60s; therefore, was placed on NRB for transport to ED. ? ?In ED, he required intubation for airway protection.  CXR demonstrated multifocal PNA on R with pleural effusion.  Head CT was ordered and is pending. Per chart review he has a hx of transudative right sided effusion 2/2 CHF and ESRD and volume typically managed with HD. ? ?Wife informed EDP that pt had been having a head cold and cough since 5/12.  He did go to dialysis that day and received treatment but did require oxygen while there which is not typical for him. ? ?Pertinent  Medical History:  ?has Malignant hypertension; Essential hypertension, benign; BPH without obstruction/lower urinary tract symptoms; Routine general medical examination at a health care facility; ESRD on dialysis [facility]); Allergic rhinitis due to pollen; Cognitive decline; Right hemiparesis ([place]); Pleural effusion; Heart failure, systolic, chronic ([place]); Vascular dementia, uncomplicated ([place]); Malnutrition of mild degree ([place]); Advance directive discussed with patient; Anemia in chronic kidney disease; Hypertensive chronic kidney disease with stage 1 through stage 4 chronic kidney disease, or unspecified chronic kidney disease; Coagulation defect, unspecified ([place]); Complication of central venous catheter; Dependence on renal dialysis ([place]); Diarrhea, unspecified; Fluid overload, unspecified; Hyperkalemia; Hyperosmolality and  hypernatremia; Hypocalcemia; Iron deficiency anemia, unspecified; Pain, unspecified; Pruritus, unspecified; Secondary hyperparathyroidism of renal origin ([place]); Shortness of breath; and Acute respiratory failure with hypoxia (HCC) on their problem list. ? ?Significant Hospital Events: ?Including procedures, antibiotic start and stop dates in addition to other pertinent events   ?5/15 admit. ? ?Interim History / Subjective:  ?Sedated, paralytics likely still on board after RSI. ? ?Objective:  ?Blood pressure (!) 138/102, pulse 61, resp. rate 16, height 5\' 7"  (1.702 m), SpO2 100 %. ?   ?Vent Mode: PRVC ?FiO2 (%):  [80 %-100 %] 80 % ?Set Rate:  [16 bmp] 16 bmp ?Vt Set:  [550 mL] 550 mL ?PEEP:  [5 cmH20] 5 cmH20 ?Plateau Pressure:  [22 GYI94-85 cmH20] 22 cmH20  ?No intake or output data in the 24 hours ending 04/14/22 1106 ?There were no vitals filed for this visit. ? ?Examination: ?General: Adult male, resting in bed, in NAD. ?Neuro: Sedated on vent. ?HEENT: [place]/AT. Sclerae anicteric. ETT in place. ?Cardiovascular: RRR, no M/R/G.  ?Lungs: Respirations even and unlabored.  Rhonchi bilaterally R > L. ?Abdomen: BS x 4, soft, NT/ND.  ?Musculoskeletal: No gross deformities, no edema.  ?Skin: Intact, warm, no rashes. ? ? ?Labs/imaging personally reviewed:  ?CXR 5/15 > right sided PNA and effusion. ?CT head 5/15 >  ? ?Assessment & Plan:  ? ?Acute hypoxic respiratory failure - s/p intubation in ED. ?- Full vent support. ?- Wean as mental status improves. ?- Bronchial hygiene. ?- Follow CXR. ? ?Presumed HCAP. ?- Empiric Vanc, Cefepime. ?- Send blood cultures, trach aspirate. ? ?Acute encephalopathy - unclear etiology, ? 2/2 hypoxia. ABG normal.  Did receive antihistamine overnight but doubt that would  cause this degree of encephalopathy. ?- Supportive care. ?- F/u on CT head. ? ?Right sided pleural effusion - has hx of same 2/2 CHF and ESRD. Fluid removal typically via HD. ?- Volume removal per HD.  ? ?Hx ESRD on HD MWF (last  session Fri 5/12) - no urgent needs currently. ?- Notify nephrology of admission. ?- Follow BMP. ? ?Hypocalcemia. ?- 1g Ca gluconate. ? ?Hx combined CHF ((Echo from July 2021 with EF 40-45%, G1DD), HTN. ?- Hold home Amlodipine. ? ? ?Best practice (evaluated daily):  ?Diet/type: NPO ?DVT prophylaxis: prophylactic heparin  ?GI prophylaxis: PPI ?Lines: N/A ?Foley:  N/A ?Code Status:  full code ?Last date of multidisciplinary goals of care discussion: None yet. ? ?Labs   ?CBC: ?Recent Labs  ?Lab 04/14/22 ?0937  ?WBC 10.2  ?NEUTROABS 9.5*  ?HGB 11.2*  ?HCT 36.2*  ?MCV 94.8  ?PLT 253  ? ? ?Basic Metabolic Panel: ?Recent Labs  ?Lab 04/14/22 ?0937  ?NA 142  ?K 5.0  ?CL 102  ?CO2 23  ?GLUCOSE 115*  ?BUN 67*  ?CREATININE 10.69*  ?CALCIUM 9.0  ? ?GFR: ?CrCl cannot be calculated (Unknown ideal weight.). ?Recent Labs  ?Lab 04/14/22 ?7829 04/14/22 ?1007  ?WBC 10.2  --   ?LATICACIDVEN  --  1.6  ? ? ?Liver Function Tests: ?Recent Labs  ?Lab 04/14/22 ?0937  ?AST 20  ?ALT 20  ?ALKPHOS 85  ?BILITOT 0.6  ?PROT 6.9  ?ALBUMIN 3.6  ? ?No results for input(s): LIPASE, AMYLASE in the last 168 hours. ?No results for input(s): AMMONIA in the last 168 hours. ? ?ABG ?   ?Component Value Date/Time  ? PHART 7.435 06/26/2020 2105  ? PCO2ART 36.6 06/26/2020 2105  ? PO2ART 58 (L) 06/26/2020 2105  ? HCO3 24.5 06/26/2020 2105  ? TCO2 25 02/27/2022 0658  ? ACIDBASEDEF 8.4 (H) 04/12/2019 1801  ? O2SAT 90.0 06/26/2020 2105  ?  ? ?Coagulation Profile: ?Recent Labs  ?Lab 04/14/22 ?0937  ?INR 1.2  ? ? ?Cardiac Enzymes: ?No results for input(s): CKTOTAL, CKMB, CKMBINDEX, TROPONINI in the last 168 hours. ? ?HbA1C: ?No results found for: HGBA1C ? ?CBG: ?Recent Labs  ?Lab 04/14/22 ?1005  ?GLUCAP 107*  ? ? ?Review of Systems:   ?Unable to obtain as pt is encephalopathic. ? ?Past Medical History:  ?He,  has a past medical history of Allergy, Anemia, BPH (benign prostatic hypertrophy), ESRD (end stage renal disease) ([place]), HFrEF (heart failure with reduced  ejection fraction) ([place]) (06/27/2020), Hypertension, Leg weakness, Slurred speech (02/07/2020), and Zoster (2007).  ? ?Surgical History:  ? ?Past Surgical History:  ?Procedure Laterality Date  ? A/V FISTULAGRAM N/A 12/19/2019  ? Procedure: A/V FISTULAGRAM - Right Arm;  Surgeon: Waynetta Sandy, MD;  Location: [place] [facility];  Service: Cardiovascular;  Laterality: N/A;  ? A/V FISTULAGRAM Left 03/15/2020  ? Procedure: A/V FISTULAGRAM;  Surgeon: Marty Heck, MD;  Location: [place] [facility];  Service: Cardiovascular;  Laterality: Left;  ? AV FISTULA PLACEMENT Right 09/01/2019  ? Procedure: ARTERIOVENOUS (AV) FISTULA CREATION RIGHT ARM;  Surgeon: Marty Heck, MD;  Location: [place];  Service: Vascular;  Laterality: Right;  ? AV FISTULA PLACEMENT Left 02/09/2020  ? Procedure: LEFT ARM BASILIC VEIN Arteriovenous FISTULA  CREATION;  Surgeon: Marty Heck, MD;  Location: [place];  Service: Vascular;  Laterality: Left;  ? BASCILIC VEIN TRANSPOSITION Left 05/17/2020  ? Procedure: LEFT ARM SECOND STAGE BASCILIC FISTULA;  Surgeon: Marty Heck, MD;  Location: [place];  Service:  Vascular;  Laterality: Left;  ? DIALYSIS/PERMA CATHETER INSERTION N/A 04/15/2019  ? Procedure: DIALYSIS/PERMA CATHETER INSERTION;  Surgeon: Katha Cabal, MD;  Location: [place] [facility];  Service: Cardiovascular;  Laterality: N/A;  ? INSERTION OF DIALYSIS CATHETER Left 11/15/2019  ? Procedure: INSERTION OF LEFT TUNNEL DIALYSIS CATHETER;  Surgeon: Waynetta Sandy, MD;  Location: [place];  Service: Vascular;  Laterality: Left;  ? LIGATION OF ARTERIOVENOUS  FISTULA Right 01/03/2020  ? Procedure: LIGATION OF ARTERIOVENOUS  FISTULA;  Surgeon: Serafina Mitchell, MD;  Location: [facility] OR;  Service: Vascular;  Laterality: Right;  ? LIGATION OF ARTERIOVENOUS  FISTULA Left 02/27/2022  ? Procedure: LIGATION OF LEFT ARM FISTULA;  Surgeon: Waynetta Sandy, MD;  Location: [place];  Service: Vascular;  Laterality:  Left;  ? PERIPHERAL VASCULAR BALLOON ANGIOPLASTY Left 03/15/2020  ? Procedure: PERIPHERAL VASCULAR BALLOON ANGIOPLASTY;  Surgeon: Marty Heck, MD;  Location: [place] [facility];  Service: Cardiovascular;  L

## 2022-04-14 NOTE — ED Provider Notes (Signed)
?[facility] ?Provider Note ? ? ?CSN: [id number] ?Arrival date & time: 04/14/22  3762 ? ?LEVEL 5 CAVEAT - ALTERED MENTAL STATUS  ? ?History ? ?Chief Complaint  ?Patient presents with  ? Altered Mental Status  ? ? ?Devin Harmon is a 66 y.o. male. ? ?HPI ?66 year old male presents with altered mental status/lethargy and shortness of breath.  History is initially from EMS and they report that the patient was found on the couch unresponsive.  Has been feeling poorly for the last few days.  No missed dialysis.  Patient is unable to provide any significant history given his lethargy.  He is on a nonrebreather as his O2 sats were originally in the 60s per the fire department. ? ?Wife arrived shortly after work-up started and she notes that the patient has been having a head cold and some cough since 5/12.  She was unable to get him out of the chair today.  He did go to dialysis on 5/12 and had a session but was also requiring oxygen which is not normal for him. ? ? ?Home Medications ?Prior to Admission medications   ?Medication Sig Start Date End Date Taking? Authorizing Provider  ?acetaminophen (TYLENOL) 325 MG tablet Take 650 mg by mouth every 6 (six) hours as needed. Given during dialysis   Yes [provider]  ?acetaminophen (TYLENOL) 500 MG tablet Take 500 mg by mouth every 6 (six) hours as needed.   Yes [provider]  ?amLODipine (NORVASC) 10 MG tablet TAKE 1 TABLET BY MOUTH EVERY DAY 01/31/22  Yes Venia Carbon, MD  ?AURYXIA 1 GM 210 MG(Fe) tablet Take 420 mg by mouth daily. 07/08/19  Yes [provider]  ?B Complex-C-Folic Acid (RENA-VITE RX) 1 MG TABS Take 1 mg by mouth daily.   Yes [provider]  ?cetirizine (ZYRTEC) 10 MG tablet Take 10 mg by mouth daily as needed for allergies.    Yes [provider]  ?Dextromethorphan-GG-APAP (CORICIDIN HBP COLD/COUGH/FLU PO) Take 2 tablets by mouth daily as needed (cold symptoms).   Yes  [provider]  ?hydrocortisone cream 1 % Apply 1 application topically daily as needed (rash).   Yes [provider]  ?neomycin-bacitracin-polymyxin (NEOSPORIN) ointment Apply 1 application topically daily as needed for wound care.    Yes [provider]  ?oxyCODONE-acetaminophen (PERCOCET) 5-325 MG tablet Take 1 tablet by mouth every 6 (six) hours as needed for severe pain. ?Patient not taking: Reported on 04/14/2022 02/27/22 02/27/23  Karoline Caldwell, PA-C  ?   ? ?Allergies    ?Sulfa antibiotics, Sulfonamide derivatives, Oxycodone, Chlorthalidone, and Hydrocodone   ? ?Review of Systems   ?Review of Systems  ?Unable to perform ROS: Severe respiratory distress  ? ?Physical Exam ?Updated Vital Signs ?BP (!) 138/102   Pulse 61   Temp 97.9 ?F (36.6 ?C)   Resp 16   Ht 5\' 7"  (1.702 m)   SpO2 100%   BMI 22.89 kg/m?  ?Physical Exam ?Vitals and nursing note reviewed.  ?Constitutional:   ?   Appearance: He is well-developed.  ?HENT:  ?   Head: Normocephalic and atraumatic.  ?Cardiovascular:  ?   Rate and Rhythm: Normal rate and regular rhythm.  ?   Heart sounds: Normal heart sounds.  ?Pulmonary:  ?   Effort: Tachypnea, accessory muscle usage and respiratory distress present.  ?   Breath sounds: Rhonchi present.  ?   Comments: Patient has intermittent cough that was unable to clear  his sputum ?Abdominal:  ?   General: There is no distension.  ?   Palpations: Abdomen is soft.  ?Skin: ?   General: Skin is warm and dry.  ?Neurological:  ?   Mental Status: He is lethargic.  ?   Comments: Patient sometimes will follow commands but other times does not seem to respond.  He is lethargic.  Moves all 4 extremities though more spontaneously than to commands  ? ? ?ED Results / Procedures / Treatments   ?Labs ?(all labs ordered are listed, but only abnormal results are displayed) ?Labs Reviewed  ?COMPREHENSIVE METABOLIC PANEL - Abnormal; Notable for the following components:  ?    Result Value  ? Glucose,  Bld 115 (*)   ? BUN 67 (*)   ? Creatinine, Ser 10.69 (*)   ? GFR, Estimated 5 (*)   ? Anion gap 17 (*)   ? All other components within normal limits  ?CBC WITH DIFFERENTIAL/PLATELET - Abnormal; Notable for the following components:  ? RBC 3.82 (*)   ? Hemoglobin 11.2 (*)   ? HCT 36.2 (*)   ? RDW 21.2 (*)   ? nRBC 0.5 (*)   ? Neutro Abs 9.5 (*)   ? Lymphs Abs 0.3 (*)   ? nRBC 2 (*)   ? All other components within normal limits  ?PROTIME-INR - Abnormal; Notable for the following components:  ? Prothrombin Time 15.5 (*)   ? All other components within normal limits  ?CBG MONITORING, ED - Abnormal; Notable for the following components:  ? Glucose-Capillary 107 (*)   ? All other components within normal limits  ?I-STAT ARTERIAL BLOOD GAS, ED - Abnormal; Notable for the following components:  ? pO2, Arterial 165 (*)   ? Calcium, Ion 1.09 (*)   ? HCT 35.0 (*)   ? Hemoglobin 11.9 (*)   ? All other components within normal limits  ?CULTURE, BLOOD (ROUTINE X 2)  ?CULTURE, BLOOD (ROUTINE X 2)  ?RESP PANEL BY RT-PCR (FLU A&B, COVID) [id number]  ?URINE CULTURE  ?CULTURE, BLOOD (ROUTINE X 2)  ?CULTURE, BLOOD (ROUTINE X 2)  ?CULTURE, RESPIRATORY W GRAM STAIN  ?LACTIC ACID, PLASMA  ?APTT  ?URINALYSIS, ROUTINE W REFLEX MICROSCOPIC  ?BRAIN NATRIURETIC PEPTIDE  ?HIV ANTIBODY (ROUTINE TESTING W REFLEX)  ?I-STAT CHEM 8, ED  ?I-STAT ARTERIAL BLOOD GAS, ED  ? ? ?EKG ?EKG Interpretation ? ?Date/Time:  Monday Apr 14 2022 09:27:34 EDT ?Ventricular Rate:  68 ?PR Interval:  176 ?QRS Duration: 121 ?QT Interval:  451 ?QTC Calculation: 480 ?R Axis:   72 ?Text Interpretation: Sinus rhythm Probable left atrial enlargement LVH with secondary repolarization abnormality Borderline prolonged QT interval Confirmed by Sherwood Gambler [phone]) on 04/14/2022 9:58:11 AM ? ?Radiology ?DG Chest Port 1 View ? ?Result Date: 04/14/2022 ?CLINICAL DATA:  Found unresponsive.  Possible sepsis. EXAM: PORTABLE CHEST 1 VIEW COMPARISON:  Chest x-ray dated July 10, 2020.  FINDINGS: Endotracheal tube tip in good position 3.0 cm above the carina. Enteric tube in the stomach. Unchanged tunneled left internal jugular dialysis catheter with tip at the cavoatrial junction. Unchanged cardiomegaly. Volume loss in the right hemithorax with hazy airspace opacities in the right mid and lower lung. Associated small right pleural effusion. The left lung is clear. No pneumothorax. No acute osseous abnormality. IMPRESSION: 1. Appropriately positioned endotracheal tube. 2. Multifocal pneumonia in the right lung with associated pleural effusion. Followup PA and lateral chest X-ray is recommended in 3-4 weeks following trial of antibiotic therapy to ensure resolution.  Electronically Signed   By: Titus Dubin M.D.   On: 04/14/2022 10:29  ? ?DG Abd Portable 1 View ? ?Result Date: 04/14/2022 ?CLINICAL DATA:  Found unresponsive.  Sepsis. EXAM: PORTABLE ABDOMEN - 1 VIEW COMPARISON:  Abdominal x-ray dated Apr 12, 2019. FINDINGS: Enteric tube tip in the stomach. The bowel gas pattern is normal. No radio-opaque calculi or other significant radiographic abnormality are seen. No acute osseous abnormality. IMPRESSION: 1. Enteric tube tip in the stomach. 2. No acute findings. Electronically Signed   By: Titus Dubin M.D.   On: 04/14/2022 10:30   ? ?Procedures ?Procedure Name: Intubation ?Date/Time: 04/14/2022 10:01 AM ?Performed by: Sherwood Gambler, MD ?Pre-anesthesia Checklist: Patient identified, Patient being monitored, Emergency Drugs available, Timeout performed and Suction available ?Oxygen Delivery Method: Non-rebreather mask ?Preoxygenation: Pre-oxygenation with 100% oxygen ?Induction Type: Rapid sequence ?Ventilation: Mask ventilation without difficulty ?Laryngoscope Size: Glidescope and 3 ?Grade View: Grade I ?Tube size: 7.5 mm ?Number of attempts: 1 ?Airway Equipment and Method: Video-laryngoscopy ?Placement Confirmation: ETT inserted through vocal cords under direct vision, CO2 detector and Breath  sounds checked- equal and bilateral ?Secured at: 24 cm ?Tube secured with: ETT holder ?Dental Injury: Teeth and Oropharynx as per pre-operative assessment  ? ? ? ?.Critical Care ?Performed by: Sherwood Gambler,

## 2022-04-14 NOTE — Progress Notes (Signed)
Patient transported from ED, to CT, and to room 0D31 without complications.  ?

## 2022-04-14 NOTE — Progress Notes (Signed)
[name] [id number] ?Admission Data: 04/14/2022 6:11 PM ?Attending Provider: Julian Hy, DO  ?MRA:[id number], Theophilus Kinds, MD ?Consults/ Treatment Team: Treatment Team:  ?Roney Jaffe, MD ? ?Devin Harmon is a 66 y.o. male patient admitted from ED awake, alert  & orientated  X 3,  DNR, VSS - Blood pressure 120/90, pulse (!) 57, temperature 98.3 ?F (36.8 ?C), temperature source Axillary, resp. rate 16, height 5\' 7"  (1.702 m), weight 66.4 kg, SpO2 100 %., O2 , intubated vented  Tele # [id number] placed and pt is currently running:normal sinus rhythm. ? ? IV site WDL:  wrist right, condition patent and no redness and left, condition patent and no redness with a transparent dsg that's clean dry and intact. ? Skin, clean-dry, skin tear noted in both right and left shine area foam dressing applied to the area.  ? ? ?Will cont to monitor and assist as needed. ? ?Hosie Spangle, RN ?04/14/2022 6:11 PM  ?

## 2022-04-14 NOTE — ED Triage Notes (Signed)
Pt BIB [facility] from home. Pt presents from home with reports of "feeling bad since Friday." Family found patient unresponsive this morning. Pt arrived on non-rebreather O2 100%. Pt unable to follow commands.  ?

## 2022-04-14 NOTE — Consult Note (Signed)
Renal Service ?Consult Note ?Devin Harmon ? ?Devin Harmon ?04/14/2022 ?Sol Blazing, MD ?Requesting Physician: Dr. Carlis Abbott, [place] ? ?Reason for Consult: ESRD pt w/ AMS and resp failure ?HPI: The patient is a 66 y.o. year-old w/ hx of anemia, BPH, ESRD on HD, HFrEF, HTN, herpes zoster who presented to ED brought by EMS for progressive gen'd weakness, confusion, SOB and overall malaise. Last HD Friday, has not missed. Had a head cough the last few days. Pt intubated in ED. CXR showed L right-sided infiltrate and /or effusion.  No infiltrates on the left. Pt admitted to ICU. Asked to see for ESRD.   ? ?Pt seen in ICU.  Pt is sedated, hx obtained from family member. On HD for 3 yrs. They had to go to "2 HD per week", they did that to help the family member.  He requires family supervision at HD as well. 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   ? ?ROS  - n/a ? ? ?Past Medical History  ?Past Medical History:  ?Diagnosis Date  ? Allergy   ? Anemia   ? BPH (benign prostatic hypertrophy)   ? ESRD (end stage renal disease) ([place])   ? M &F - [place]  ? HFrEF (heart failure with reduced ejection fraction) ([place]) 06/27/2020  ? Echo 06/27/20: LVEF 40-45%, hypokinesis basal to mid inferior myocardium  ? Hypertension   ? Leg weakness   ? right  ? Slurred speech 02/07/2020  ? ? cva per Dr Silvio Pate, and cognitive changes  ? Zoster 2007  ? facial, hospital 2007  ? ?Past Surgical History  ?Past Surgical History:  ?Procedure Laterality Date  ? A/V FISTULAGRAM N/A 12/19/2019  ? Procedure: A/V FISTULAGRAM - Right Arm;  Surgeon: Waynetta Sandy, MD;  Location: [place] [facility];  Service: Cardiovascular;  Laterality: N/A;  ? A/V FISTULAGRAM Left 03/15/2020  ? Procedure: A/V FISTULAGRAM;  Surgeon: Marty Heck, MD;  Location: [place] [facility];  Service: Cardiovascular;  Laterality: Left;  ? AV FISTULA PLACEMENT Right 09/01/2019   ? Procedure: ARTERIOVENOUS (AV) FISTULA CREATION RIGHT ARM;  Surgeon: Marty Heck, MD;  Location: [place];  Service: Vascular;  Laterality: Right;  ? AV FISTULA PLACEMENT Left 02/09/2020  ? Procedure: LEFT ARM BASILIC VEIN Arteriovenous FISTULA  CREATION;  Surgeon: Marty Heck, MD;  Location: [place];  Service: Vascular;  Laterality: Left;  ? BASCILIC VEIN TRANSPOSITION Left 05/17/2020  ? Procedure: LEFT ARM SECOND STAGE BASCILIC FISTULA;  Surgeon: Marty Heck, MD;  Location: [place];  Service: Vascular;  Laterality: Left;  ? DIALYSIS/PERMA CATHETER INSERTION N/A 04/15/2019  ? Procedure: DIALYSIS/PERMA CATHETER INSERTION;  Surgeon: Katha Cabal, MD;  Location: [place] [facility];  Service: Cardiovascular;  Laterality: N/A;  ? INSERTION OF DIALYSIS CATHETER Left 11/15/2019  ? Procedure: INSERTION OF LEFT TUNNEL DIALYSIS CATHETER;  Surgeon: Waynetta Sandy, MD;  Location: [place];  Service: Vascular;  Laterality: Left;  ? LIGATION OF ARTERIOVENOUS  FISTULA Right 01/03/2020  ? Procedure: LIGATION OF ARTERIOVENOUS  FISTULA;  Surgeon: Serafina Mitchell, MD;  Location[facility];  Service: Vascular;  Laterality: Right;  ? LIGATION OF ARTERIOVENOUS  FISTULA Left 02/27/2022  ? Procedure: LIGATION OF LEFT ARM FISTULA;  Surgeon: Waynetta Sandy, MD;  Location: [place];  Service: Vascular;  Laterality: Left;  ? PERIPHERAL VASCULAR BALLOON ANGIOPLASTY Left 03/15/2020  ?  Procedure: PERIPHERAL VASCULAR BALLOON ANGIOPLASTY;  Surgeon: Marty Heck, MD;  Location: [place] [facility];  Service: Cardiovascular;  Laterality: Left;  UPPER ARM  ? PERIPHERAL VASCULAR INTERVENTION Right 12/19/2019  ? Procedure: PERIPHERAL VASCULAR INTERVENTION;  Surgeon: Waynetta Sandy, MD;  Location: [place] [facility];  Service: Cardiovascular;  Laterality: Right;  ? REMOVAL OF A DIALYSIS CATHETER Right 11/15/2019  ? Procedure: REMOVAL OF A RIGHT TUNNEL DIALYSIS CATHETER;  Surgeon: Waynetta Sandy, MD;  Location: [place];  Service: Vascular;  Laterality: Right;  ? ?Family History  ?Family History  ?Problem Relation Age of Onset  ? Hypertension Mother   ? Cancer Mother   ?     lung cancer  ? Hypertension Brother   ? Heart disease Father   ? Coronary artery disease Neg Hx   ? Diabetes Neg Hx   ? ?Social History  reports that he has never smoked. He has never used smokeless tobacco. He reports that he does not currently use alcohol. He reports that he does not currently use drugs. ?Allergies  ?Allergies  ?Allergen Reactions  ? Sulfa Antibiotics Hives  ? Sulfonamide Derivatives Hives  ? Oxycodone   ?  Hallucination  ? Chlorthalidone Other (See Comments)  ?  felt "bad" and ED ?  ? Hydrocodone Nausea Only  ?  dizzy  ? ?Home medications ?Prior to Admission medications   ?Medication Sig Start Date End Date Taking? Authorizing Provider  ?acetaminophen (TYLENOL) 325 MG tablet Take 650 mg by mouth every 6 (six) hours as needed. Given during dialysis   Yes [provider]  ?acetaminophen (TYLENOL) 500 MG tablet Take 500 mg by mouth every 6 (six) hours as needed.   Yes [provider]  ?amLODipine (NORVASC) 10 MG tablet TAKE 1 TABLET BY MOUTH EVERY DAY 01/31/22  Yes Venia Carbon, MD  ?AURYXIA 1 GM 210 MG(Fe) tablet Take 420 mg by mouth daily. 07/08/19  Yes [provider]  ?B Complex-C-Folic Acid (RENA-VITE RX) 1 MG TABS Take 1 mg by mouth daily.   Yes [provider]  ?cetirizine (ZYRTEC) 10 MG tablet Take 10 mg by mouth daily as needed for allergies.    Yes [provider]  ?Dextromethorphan-GG-APAP (CORICIDIN HBP COLD/COUGH/FLU PO) Take 2 tablets by mouth daily as needed (cold symptoms).   Yes [provider]  ?hydrocortisone cream 1 % Apply 1 application topically daily as needed (rash).   Yes [provider]  ?neomycin-bacitracin-polymyxin (NEOSPORIN) ointment Apply 1 application topically daily as needed for wound care.    Yes [provider]  ?oxyCODONE-acetaminophen (PERCOCET) 5-325 MG tablet Take 1 tablet by mouth every 6 (six) hours as needed for severe pain. ?Patient not taking: Reported on 04/14/2022 02/27/22 02/27/23  Karoline Caldwell, PA-C  ? ? ? ?Vitals:  ? 04/14/22 1125 04/14/22 1200 04/14/22 1330 04/14/22 1400  ?BP: 100/78 101/78 (!) 153/86 (!) 133/100  ?Pulse: (!) 58 96 60 61  ?Resp: 16 18 19 17   ?Temp: 97.9 ?F (36.6 ?C)     ?SpO2: 100% 94%  100%  ?Weight:  66.4 kg    ?Height:      ? ?Exam ?Gen on vent, sedated ?No rash, cyanosis or gangrene ?Sclera anicteric, throat w/ ETT ?No jvd or bruits ?Chest clear anterior/ lateral ?RRR no RG ?Abd soft ntnd no mass or ascites +bs ?GU normal male ?MS no joint effusions or deformity ?Ext no sig UE or LE edema, no wounds or ulcers ?Neuro is on  vent, sedated ?LIJ TDC intact, old nonfunctioning accesses both UE's ? ? ? Home meds include - norvasc 10, auryxia 2 ac, percocet prn, prns/ vits/ supps ? ? ? OP HD: [place] M-F (2 hd/ week) ? 3.5h  400/1.5  63.5kg  P2   Heparin 3000   LIJ TDC ?- mircera 200 ug q2, last 5/05, due 5/19 ?- venofer 100mg  q hd IV, 5/05 - 5/19 ?- hectorol 5 ug IV tiw ? ?Assessment/ Plan: ?Acute hypoxic resp failure - on vent ?PNA - presumed HCAP, on IV vanc/ cefepime ?AMS - multifactorial due to acute infection, underlying memory issues.  ?R effusion - per CCM ?ESRD - on HD MWF. Will plan HD later tonight or tomorrow am at the latest.  ?HTN/ vol - cont home meds per tube, will try to lower his dry wt w/ HD ?Anemia esrd - Hb 11-12, next  esa due on 5/19.  Hold IV Fe w/ acute infection.  ?BMD ckd - CCa in range, add on phos. Resume binder when eating.  ? ? ? ?Kelly Splinter  MD ?04/14/2022, 2:19 PM ?Recent Labs  ?Lab 04/14/22 ?[id number] 04/14/22 ?1104  ?HGB 11.2* 11.9*  ?ALBUMIN 3.6  --   ?CALCIUM 9.0  --   ?CREATININE 10.69*  --   ?K 5.0 4.7  ? ? ?

## 2022-04-14 NOTE — IPAL (Signed)
?  Interdisciplinary Goals of Care Family Meeting ? ? ?Date carried out: 04/14/2022 ? ?Location of the meeting: Bedside ? ?Member's involved: Physician, Bedside Registered Nurse, and Family Member or next of kin ? ?Durable Power of Tour manager: wife   ? ?Discussion: We discussed goals of care for Devin Harmon.  She understands he has required intubation in the past, but having issues with encephalopathy is new. She has helped with his dialysis compliance in the past year by helping him stay to complete his sessions. He has been DNR in after discussion with his PCP in the past. Continuing all aggressive care otherwise at this time. ? ?Code status: Full DNR, con't intubation currently. ? ?Disposition: Continue current acute care ? ? ?Time spent for the meeting: 15 min. ? ?Devin Harmon ?04/14/2022, 2:18 PM ? ?

## 2022-04-14 NOTE — Plan of Care (Addendum)
Plan of care added by this RN ? ?Removed silver band from patient's L ring finger and placed in labeled specimen cup. It is sitting bedside by room mirror. No other patient belongings in room.  ? ?

## 2022-04-14 NOTE — Progress Notes (Signed)
eLink Physician-Brief Progress Note ?Patient Name: Devin Harmon ?DOB: [birth year] ?MRN: [id number] ? ? ?Date of Service ? 04/14/2022  ?HPI/Events of Note ? Received query from pharmacy regarding antibiotics.   ? ?Pt covered empirically for pneumonia on Vanc, Cefepime and azithromycin.  MRSA PCR turned out negative.   ?eICU Interventions ? Ok to discontinue Vancomycin.   ? ? ? ?Intervention Category ?Intermediate Interventions: Infection - evaluation and management ? ?Elsie Lincoln ?04/14/2022, 7:28 PM ?

## 2022-04-14 NOTE — Progress Notes (Signed)
MRSA PCR came back neg. Ok to [employer] per Dr. James Ivanoff. ? ?Onnie Boer, PharmD, BCIDP, AAHIVP, CPP ?Infectious Disease Pharmacist ?04/14/2022 7:25 PM ? ? ?

## 2022-04-14 NOTE — Progress Notes (Signed)
Pharmacy Antibiotic Note ? ?Devin Harmon is a 66 y.o. male admitted on 04/14/2022 with pneumonia.  Pharmacy has been consulted for cefepime and vancomycin dosing. ? ?WBC 10.2, afebrile, LA 1.6 ?ESRD on HD MWF outpt - last HD on 5/12 ? ?Plan: ?Cefepime 1g IV q24h  until HD plan is determined ?Vancomycin 1500 mg IV x 1, followed by ?Vancomycin 500mg  IV qHD once HD plan is determined ?F/u nephrology consult for HD plans ?Monitor WBC, SCR, temp, and clinical s/sx of infection ?F/u cultures and MRSA PCR and de-escalate antibiotics as able.  ? ? ?Height: 5\' 7"  (170.2 cm) ?Weight: 66.4 kg (146 lb 6.2 oz) ?IBW/kg (Calculated) : 66.1 ? ?Temp (24hrs), Avg:97.9 ?F (36.6 ?C), Min:97.9 ?F (36.6 ?C), Max:97.9 ?F (36.6 ?C) ? ?Recent Labs  ?Lab 04/14/22 ?[id number] 04/14/22 ?1007  ?WBC 10.2  --   ?CREATININE 10.69*  --   ?LATICACIDVEN  --  1.6  ?  ?Estimated Creatinine Clearance: 6.4 mL/min (A) (by C-G formula based on SCr of 10.69 mg/dL (H)).   ? ?Allergies  ?Allergen Reactions  ? Sulfa Antibiotics Hives  ? Sulfonamide Derivatives Hives  ? Oxycodone   ?  Hallucination  ? Chlorthalidone Other (See Comments)  ?  felt "bad" and ED ?  ? Hydrocodone Nausea Only  ?  dizzy  ? ? ?Antimicrobials this admission: ?Ceftriaxone 5/15 x1  ?Azithromycin 5/15 x 1 ?Cefepime 5/15 >>  ?Vancomycin  5/15 >> ? ?Dose adjustments this admission: ?N/A ? ?Microbiology results: ?5/15 BCx: in process ?5/15 MRSA PCR: ordered ? ?Thank you for allowing pharmacy to be a part of this patient?s care. ? ?Kaleen Mask ?04/14/2022 12:53 PM ? ?

## 2022-04-15 ENCOUNTER — Inpatient Hospital Stay (HOSPITAL_COMMUNITY): Payer: Medicare Other

## 2022-04-15 DIAGNOSIS — N186 End stage renal disease: Secondary | ICD-10-CM | POA: Diagnosis not present

## 2022-04-15 DIAGNOSIS — J189 Pneumonia, unspecified organism: Secondary | ICD-10-CM

## 2022-04-15 LAB — MAGNESIUM: Magnesium: 2.4 mg/dL (ref 1.7–2.4)

## 2022-04-15 LAB — BASIC METABOLIC PANEL
Anion gap: 15 (ref 5–15)
BUN: 81 mg/dL — ABNORMAL HIGH (ref 8–23)
CO2: 23 mmol/L (ref 22–32)
Calcium: 8.4 mg/dL — ABNORMAL LOW (ref 8.9–10.3)
Chloride: 101 mmol/L (ref 98–111)
Creatinine, Ser: 11.23 mg/dL — ABNORMAL HIGH (ref 0.61–1.24)
GFR, Estimated: 5 mL/min — ABNORMAL LOW (ref >60)
Glucose, Bld: 94 mg/dL (ref 70–99)
Potassium: 5.3 mmol/L — ABNORMAL HIGH (ref 3.5–5.1)
Sodium: 139 mmol/L (ref 135–145)

## 2022-04-15 LAB — HEPATITIS B SURFACE ANTIGEN: Hepatitis B Surface Ag: NONREACTIVE

## 2022-04-15 LAB — PROCALCITONIN: Procalcitonin: 1.96 ng/mL

## 2022-04-15 LAB — HEPATITIS B SURFACE ANTIBODY,QUALITATIVE: Hep B S Ab: NONREACTIVE

## 2022-04-15 LAB — GLUCOSE, CAPILLARY
Glucose-Capillary: 81 mg/dL (ref 70–99)
Glucose-Capillary: 90 mg/dL (ref 70–99)
Glucose-Capillary: 98 mg/dL (ref 70–99)
Glucose-Capillary: 98 mg/dL (ref 70–99)
Glucose-Capillary: 99 mg/dL (ref 70–99)
Glucose-Capillary: 123 mg/dL — ABNORMAL HIGH (ref 70–99)

## 2022-04-15 LAB — PHOSPHORUS: Phosphorus: 10.6 mg/dL — ABNORMAL HIGH (ref 2.5–4.6)

## 2022-04-15 LAB — CBC
HCT: 33.4 % — ABNORMAL LOW (ref 39.0–52.0)
Hemoglobin: 10.2 g/dL — ABNORMAL LOW (ref 13.0–17.0)
MCH: 29.1 pg (ref 26.0–34.0)
MCHC: 30.5 g/dL (ref 30.0–36.0)
MCV: 95.2 fL (ref 80.0–100.0)
Platelets: 206 10*3/uL (ref 150–400)
RBC: 3.51 MIL/uL — ABNORMAL LOW (ref 4.22–5.81)
RDW: 20.6 % — ABNORMAL HIGH (ref 11.5–15.5)
WBC: 8.4 10*3/uL (ref 4.0–10.5)
nRBC: 0.2 % (ref 0.0–0.2)

## 2022-04-15 MED ORDER — VITAL 1.5 CAL PO LIQD
1000.0000 mL | ORAL | Status: DC
  Administered 2022-04-15 – 2022-04-16 (×2): 1000 mL
  Filled 2022-04-15 (×2): qty 1000

## 2022-04-15 MED ORDER — PROSOURCE TF PO LIQD
45.0000 mL | Freq: Every day | ORAL | Status: DC
  Administered 2022-04-16: 45 mL
  Filled 2022-04-15: qty 45

## 2022-04-15 MED ORDER — RENA-VITE PO TABS
1.0000 | ORAL_TABLET | Freq: Every day | ORAL | Status: DC
  Administered 2022-04-15 – 2022-04-17 (×3): 1
  Filled 2022-04-15 (×3): qty 1

## 2022-04-15 MED ORDER — ALTEPLASE 2 MG IJ SOLR
2.0000 mg | Freq: Once | INTRAMUSCULAR | Status: AC
Start: 2022-04-15 — End: 2022-04-15
  Administered 2022-04-15: 2 mg
  Filled 2022-04-15: qty 2

## 2022-04-15 MED ORDER — ALTEPLASE 2 MG IJ SOLR
2.0000 mg | Freq: Once | INTRAMUSCULAR | Status: AC
  Administered 2022-04-15: 2 mg
  Filled 2022-04-15: qty 2

## 2022-04-15 NOTE — Progress Notes (Signed)
removed 3517mls net fluid , tolerated fluid removal well, poorly functioning dialysis catheter an between 200 and 350 for most of tx,  gave heparin 1500 units pre and mix tx, as ordered.  packed catheter with cathflo activase, labled appropriately until next dialysis treatment, Please do not push or flush this medication.  pre bp 132/84 post bp 98/73  pre weight 61.6kg post weight 58.0kg bed scales. ?

## 2022-04-15 NOTE — Progress Notes (Signed)
Initial Nutrition Assessment ? ?DOCUMENTATION CODES:  ? ?Severe malnutrition in context of chronic illness ? ?INTERVENTION:  ? ?Tube feeding via OG tube: ?- Change to Vital 1.5 @ 50 ml/hr (1200 ml/day) ?- ProSource TF 45 ml daily ? ?Tube feeding regimen provides 1840 kcal, 92 grams of protein, and 917 ml of H2O. ? ?- Renal MVI daily per tube ? ?NUTRITION DIAGNOSIS:  ? ?Severe Malnutrition related to chronic illness (ESRD, CHF, vascular dementia) as evidenced by moderate fat depletion, severe muscle depletion, percent weight loss (7.2% weight loss in 2 months). ? ?GOAL:  ? ?Patient will meet greater than or equal to 90% of their needs ? ?MONITOR:  ? ?Vent status, Labs, Weight trends, TF tolerance, I & O's ? ?REASON FOR ASSESSMENT:  ? ?Ventilator, Consult ?Enteral/tube feeding initiation and management ? ?ASSESSMENT:  ? ?66 year old male who presented to the ED on 5/15 after being found unresponsive by family. PMH of ESRD on HD, HTN, CHF, vascular dementia, anemia. Pt admitted with acute respiratory failure with hypoxia requiring intubation, multifocal PNA with R pleural effusion, acute encephalopathy. ? ?Discussed pt with RN and during ICU rounds. Plan for HD later today. Holding off on extubation due to mental status and awaiting HD. Pt with hyperphosphatemia and hyperkalemia. Will follow for lab trends after HD is completed. ? ?Consult received for enteral nutrition initiation and management. Pt with OG tube in stomach per abdominal x-ray on 5/15. ? ?Unable to obtain diet and weight history at this time. Reviewed weight history in chart. Pt with a 4.8 kg weight loss since 02/27/22. This is a 7.2% weight loss in less than 2 months which is severe and significant for timeframe. Suspect this is true dry weight loss given pt's EDW is listed as 63.5 kg which is above pt's current weight. Pt meets criteria for severe malnutrition. ? ?Discussed adjustment in tube feeding regimen with RN. ? ?Current TF: Vital High Protein @  40 ml/hr, ProSource TF 45 ml BID ? ?EDW: 63.5 kg ?Admit weight: 66.4 kg ?Current weight: 61.5 kg ? ?Pt with non-pitting edema to BUE and BLE. ? ?Patient is currently intubated on ventilator support ?MV: 8.2 L/min ?Temp (24hrs), Avg:98.8 ?F (37.1 ?C), Min:97.5 ?F (36.4 ?C), Max:99.8 ?F (37.7 ?C) ? ?Drips: ?Fentanyl ? ?Medications reviewed and include: aranesp weekly, colace, protonix, miralax, IV abx ? ?Labs reviewed: potassium 5.3, ionized calcium 1.09, phosphorus 10.6 ?CBG's: 81-95 x 24 hours ? ?I/O's: +1.2 L since admit ? ?NUTRITION - FOCUSED PHYSICAL EXAM: ? ?Flowsheet Row Most Recent Value  ?Orbital Region Moderate depletion  ?Upper Arm Region Severe depletion  ?Thoracic and Lumbar Region Moderate depletion  ?Buccal Region Unable to assess  ?Temple Region Moderate depletion  ?Clavicle Bone Region Moderate depletion  ?Clavicle and Acromion Bone Region Severe depletion  ?Scapular Bone Region Severe depletion  ?Dorsal Hand Moderate depletion  ?Patellar Region Severe depletion  ?Anterior Thigh Region Severe depletion  ?Posterior Calf Region Moderate depletion  ?Edema (RD Assessment) Mild  [BUE, BLE]  ?Hair Reviewed  ?Eyes Reviewed  ?Mouth Reviewed  ?Skin Reviewed  ?Nails Reviewed  ? ?  ? ? ?Diet Order:   ?Diet Order   ? ?       ?  Diet NPO time specified  Diet effective now       ?  ? ?  ?  ? ?  ? ? ?EDUCATION NEEDS:  ? ?Not appropriate for education at this time ? ?Skin:  Skin Assessment: ?Skin Integrity Issues: ?Incisions: RLE, LLE ? ?  Last BM:  no documented BM ? ?Height:  ? ?Ht Readings from Last 1 Encounters:  ?04/14/22 5\' 7"  (1.702 m)  ? ? ?Weight:  ? ?Wt Readings from Last 1 Encounters:  ?04/15/22 61.5 kg  ? ? ?BMI:  Body mass index is 21.24 kg/m?. ? ?Estimated Nutritional Needs:  ? ?Kcal:  1800-2000 ? ?Protein:  90-110 grams ? ?Fluid:  1000 ml + UOP ? ? ? ?Gustavus Bryant, MS, RD, LDN ?Inpatient Clinical Dietitian ?Please see AMiON for contact information. ? ?

## 2022-04-15 NOTE — Progress Notes (Signed)
?  Transition of Care (TOC) Screening Note ? ? ?Patient Details  ?Name: Devin Harmon ?Date of Birth: 02-06-1956 ? ? ?Transition of Care (TOC) CM/SW Contact:    ?Tom-Johnson, Renea Ee, RN ?Phone Number: ?04/15/2022, 1:23 PM ? ?Patient is admitted for Respiratory Failure with Hypoxia. Currently intubated. Having HD at bedside.  ?Transition of Care Department [facility]) has reviewed patient and no TOC needs or recommendations have been identified at this time. TOC will continue to monitor patient advancement through interdisciplinary progression rounds. If new patient transition needs arise, please place a TOC consult. ?  ?

## 2022-04-15 NOTE — Progress Notes (Addendum)
? ?NAME:  ILIYA SPIVACK, MRN:  [id number], DOB:  [birth year], LOS: 1 ?ADMISSION DATE:  04/14/2022, CONSULTATION DATE:  04/14/22 ?REFERRING MD:  Regenia Skeeter CHIEF COMPLAINT:  Resp failure  ? ?History of Present Illness:  ?ELIZAR ALPERN is a 66 y.o. male who has a PMH as below including ESRD on HD MWF, last session Fri 5/12. He presented to [facility] ED 5/15 by EMS with reports of "feeling bad since Friday 5/12".  Family went and found him unresponsive on the couch in his home on morning of presentation.  On EMS arrival, he was minimally responsive and was hypoxic to the 60s; therefore, was placed on NRB for transport to ED. ? ?In ED, he required intubation for airway protection.  CXR demonstrated multifocal PNA on R with pleural effusion.  Head CT was ordered and is pending. Per chart review he has a hx of transudative right sided effusion 2/2 CHF and ESRD and volume typically managed with HD. ? ?Wife informed EDP that pt had been having a head cold and cough since 5/12.  He did go to dialysis that day and received treatment but did require oxygen while there which is not typical for him. ? ?Pertinent  Medical History:  ?has Malignant hypertension; Essential hypertension, benign; BPH without obstruction/lower urinary tract symptoms; Routine general medical examination at a health care facility; ESRD (end stage renal disease) ([place]); Allergic rhinitis due to pollen; Cognitive decline; Right hemiparesis ([place]); Recurrent right pleural effusion; Heart failure, systolic, chronic ([place]); Vascular dementia, uncomplicated ([place]); Malnutrition of mild degree ([place]); Advance directive discussed with patient; Anemia in chronic kidney disease; Hypertensive chronic kidney disease with stage 1 through stage 4 chronic kidney disease, or unspecified chronic kidney disease; Coagulation defect, unspecified ([place]); Complication of central venous catheter; Dependence on renal dialysis ([place]); Diarrhea, unspecified; Fluid overload, unspecified;  Hyperkalemia; Hyperosmolality and hypernatremia; Hypocalcemia; Iron deficiency anemia, unspecified; Pain, unspecified; Pruritus, unspecified; Secondary hyperparathyroidism of renal origin ([place]); Shortness of breath; Acute respiratory failure with hypoxia ([place]); Respiratory failure with hypoxia ([place]); Acute encephalopathy; and Acute on chronic combined systolic and diastolic congestive heart failure ([place]) on their problem list. ? ?Significant Hospital Events: ?Including procedures, antibiotic start and stop dates in addition to other pertinent events   ?5/15 admit. ? ?Interim History / Subjective:  ?On vent PSV doing well ?On 50 mcg fentanyl; Can MAE but not on commands; opens eyes to name; cough/gag present; PERRL ?Plan for HD later today ? ?Objective:  ?Blood pressure 102/82, pulse (!) 56, temperature 99.8 ?F (37.7 ?C), temperature source Oral, resp. rate 16, height 5\' 7"  (1.702 m), weight 61.5 kg, SpO2 98 %. ?   ?Vent Mode: PRVC ?FiO2 (%):  [40 %-100 %] 40 % ?Set Rate:  [16 bmp] 16 bmp ?Vt Set:  [520 mL-550 mL] 520 mL ?PEEP:  [5 cmH20] 5 cmH20 ?Plateau Pressure:  [18 cmH20-26 cmH20] 18 cmH20  ? ?Intake/Output Summary (Last 24 hours) at 04/15/2022 0813 ?Last data filed at 04/15/2022 0600 ?Gross per 24 hour  ?Intake 1267.28 ml  ?Output 0 ml  ?Net 1267.28 ml  ? ?Filed Weights  ? 04/14/22 1200 04/15/22 0500  ?Weight: 66.4 kg 61.5 kg  ? ? ?Examination: ?General:  critically ill appearing on mech vent ?HEENT: MM pink/moist; ETT in place ?Neuro: On 50 mcg fentanyl; Can MAE but not on commands; opens eyes to name; cough/gag present; PERRL ?CV: s1s2, no m/r/g ?PULM:  dim clear BS bilaterally; on mech vent PRVC ?GI: soft, bsx4 active  ?Extremities: warm/dry, no edema  ?  Skin: no rashes or lesions appreciated ? ?Labs/imaging personally reviewed:  ?CXR 5/15 > right sided PNA and effusion. ?CT head 5/15 >  ? ?Assessment & Plan:  ? ?Acute hypoxic respiratory failure - s/p intubation in ED. ?P: ?-PSV trial this am; will hold off on  extubation due to mental status and awaiting HD today ?-rest on PRVC tonight 6-8 cc/kg ideal body weight ?-Wean PEEP/FiO2 for SpO2 >92% ?-VAP bundle in place ?-Daily SAT and SBT ?-PAD protocol in place ?-wean sedation for RASS goal 0 to -1 ?-Follow intermittent CXR and ABG PRN ? ?Presumed HCAP ?P: ?-mrsa pcr negative; vanc stopped ?-cont cefepime/azithro for cap ppx; PCT 1.96 ?-follow bcx2, trach culture ? ?Acute encephalopathy - unclear etiology, ? 2/2 hypoxia. ABG normal.  Did receive antihistamine overnight but doubt that would cause this degree of encephalopathy. ?-ct head 5/15: no acute infarction; ammonia and tsh WNL ?Hx of vascular dementia: per family baseline answers yes and no;  ?P: ?-limit sedating meds ?-wean sedation for RASS 0 to -1 ? ?Right sided pleural effusion - has hx of same 2/2 CHF and ESRD. Fluid removal typically via HD. ?- Volume removal w/ HD per nephro ?- R pleural effusion remains small this am; no need for thoracentesis ?-trend CXR ? ?Hx ESRD on HD MWF (last session Fri 5/12) - no urgent needs currently. ?Hypocalcemia ?P: ?-nephro following ?-HD plan for today ?-Trend BMP / urinary output ?-Replace electrolytes as indicated ?-Avoid nephrotoxic agents, ensure adequate renal perfusion ? ?Hx combined CHF ((Echo from July 2021 with EF 40-45%, G1DD), HTN. ?P: ?-HD per nephro ?-hold home anti-hypertensives while normotensive ? ? ?Best practice (evaluated daily):  ?Diet/type: NPO ?DVT prophylaxis: prophylactic heparin  ?GI prophylaxis: PPI ?Lines: N/A ?Foley:  N/A ?Code Status:  DNR ?Last date of multidisciplinary goals of care discussion: ipal 5/15 made DNR; 5/16 spoke with wife [place] over phone and updated ? ? ?Critical care time: 35 min.  ? ? ?Mikki Harbor, PA-C ?[facility] ?04/15/2022, 8:45 AM ? ?Please see [email or web address] for pager details. ? ?From 7A-7P if no response, please call [phone]. ?After hours, please call [place] [phone]. ? ? ? ?

## 2022-04-15 NOTE — Progress Notes (Signed)
[facility] ?Progress Note ? ?Subjective: seen in ICU, on vent ? ?Vitals:  ? 04/15/22 0741 04/15/22 0742 04/15/22 0743 04/15/22 0800  ?BP:  102/82  114/79  ?Pulse:  (!) 56  (!) 54  ?Resp:  16  12  ?Temp: 99.8 ?F (37.7 ?C)     ?TempSrc: Oral     ?SpO2:  98% 98% 98%  ?Weight:      ?Height:      ? ? ?Exam: ?on vent ,sedated ? no jvd ? throat ett in place ? Chest cta bilat and lat ? Cor reg no RG ? Abd soft ntnd no ascites ?  Ext no LE edema ?  Neuro on vent and sedated, not following commands ?  LIJ TDC intact ?  ?  ? Home meds include - norvasc 10, auryxia 2 ac, percocet prn, prns/ vits/ supps ?  ? CXR 5/16 - IMPRESSION: moderate to severe cardiomegaly. Mild central vascular fullness appears similar. There are no overt edema findings. Small right pleural effusion and patchy consolidation in the right lower lung field continue to be noted. Left lung remains clear. The right lung opacities are slightly less confluent than previously but in all other respects there are no further changes ? ? OP HD: [place] M-F (2 hd/ week) ? 3.5h  400/1.5  63.5kg  P2   Heparin 3000   LIJ TDC ?- mircera 200 ug q2, last 5/05, due 5/19 ?- venofer 160m q hd IV, 5/05 - 5/19 ?- hectorol 5 ug IV tiw ?  ?Assessment/ Plan: ?Acute hypoxic resp failure - on vent ?PNA - presumed HCAP, on IV vanc/ cefepime ?AMS - multifactorial due to acute infection, underlying memory issues.  ?R effusion - per CCM ?ESRD - on HD MWF. HD today off schedule. HD tomorrow to get back on schedule. ?HTN/ vol - cont home meds per tube, will try to lower dry wt w/ HD ?Anemia esrd - Hb 11-12, next  esa due on 5/19.  Hold IV Fe w/ acute infection.  ?BMD ckd - CCa in range, phos is high. Resume binder when eating.  ?  ? ? ? ? ?Rob SDoctor, hospital?04/15/2022, 9:20 AM ? ? ?Recent Labs  ?Lab 04/14/22 ?[id number]/15/23 ?1104 04/14/22 ?1451 04/15/22 ?0428  ?HGB 11.2* 11.9*  --  10.2*  ?ALBUMIN 3.6  --   --   --   ?CALCIUM 9.0  --   --  8.4*  ?PHOS  --   --  9.9* 10.6*   ?CREATININE 10.69*  --   --  11.23*  ?K 5.0 4.7  --  5.3*  ? ?Inpatient medications: ? chlorhexidine gluconate (MEDLINE KIT)  15 mL Mouth Rinse BID  ? Chlorhexidine Gluconate Cloth  6 each Topical Q0600  ? [START ON 04/18/2022] darbepoetin (ARANESP) injection - DIALYSIS  200 mcg Intravenous Q Fri-HD  ? docusate  100 mg Per Tube BID  ? feeding supplement (PROSource TF)  45 mL Per Tube BID  ? feeding supplement (VITAL HIGH PROTEIN)  1,000 mL Per Tube Q24H  ? fentaNYL (SUBLIMAZE) injection  25 mcg Intravenous Once  ? heparin  5,000 Units Subcutaneous Q8H  ? mouth rinse  15 mL Mouth Rinse 10 times per day  ? pantoprazole sodium  40 mg Per Tube Daily  ? polyethylene glycol  17 g Per Tube Daily  ? ? azithromycin    ? ceFEPime (MAXIPIME) IV Stopped (04/14/22 1429)  ? fentaNYL infusion INTRAVENOUS 50 mcg/hr (04/15/22 0800)  ? ?albuterol, docusate, fentaNYL,  heparin, midazolam, midazolam, polyethylene glycol ? ? ? ? ? ? ?

## 2022-04-16 ENCOUNTER — Inpatient Hospital Stay (HOSPITAL_COMMUNITY): Payer: Medicare Other

## 2022-04-16 DIAGNOSIS — J9601 Acute respiratory failure with hypoxia: Secondary | ICD-10-CM | POA: Diagnosis not present

## 2022-04-16 DIAGNOSIS — E43 Unspecified severe protein-calorie malnutrition: Secondary | ICD-10-CM | POA: Insufficient documentation

## 2022-04-16 LAB — GLUCOSE, CAPILLARY
Glucose-Capillary: 101 mg/dL — ABNORMAL HIGH (ref 70–99)
Glucose-Capillary: 129 mg/dL — ABNORMAL HIGH (ref 70–99)
Glucose-Capillary: 112 mg/dL — ABNORMAL HIGH (ref 70–99)
Glucose-Capillary: 142 mg/dL — ABNORMAL HIGH (ref 70–99)
Glucose-Capillary: 127 mg/dL — ABNORMAL HIGH (ref 70–99)
Glucose-Capillary: 105 mg/dL — ABNORMAL HIGH (ref 70–99)

## 2022-04-16 LAB — MAGNESIUM: Magnesium: 2.1 mg/dL (ref 1.7–2.4)

## 2022-04-16 LAB — BASIC METABOLIC PANEL
Anion gap: 13 (ref 5–15)
BUN: 56 mg/dL — ABNORMAL HIGH (ref 8–23)
CO2: 23 mmol/L (ref 22–32)
Calcium: 7.8 mg/dL — ABNORMAL LOW (ref 8.9–10.3)
Chloride: 102 mmol/L (ref 98–111)
Creatinine, Ser: 7.86 mg/dL — ABNORMAL HIGH (ref 0.61–1.24)
GFR, Estimated: 7 mL/min — ABNORMAL LOW (ref >60)
Glucose, Bld: 115 mg/dL — ABNORMAL HIGH (ref 70–99)
Potassium: 4.7 mmol/L (ref 3.5–5.1)
Sodium: 138 mmol/L (ref 135–145)

## 2022-04-16 LAB — HEPATITIS B SURFACE ANTIBODY, QUANTITATIVE: Hep B S AB Quant (Post): <3.1 m[IU]/mL — ABNORMAL LOW (ref >9.9)

## 2022-04-16 LAB — CBC
HCT: 32.4 % — ABNORMAL LOW (ref 39.0–52.0)
Hemoglobin: 9.9 g/dL — ABNORMAL LOW (ref 13.0–17.0)
MCH: 29.1 pg (ref 26.0–34.0)
MCHC: 30.6 g/dL (ref 30.0–36.0)
MCV: 95.3 fL (ref 80.0–100.0)
Platelets: 123 10*3/uL — ABNORMAL LOW (ref 150–400)
RBC: 3.4 MIL/uL — ABNORMAL LOW (ref 4.22–5.81)
RDW: 19.9 % — ABNORMAL HIGH (ref 11.5–15.5)
WBC: 8.6 10*3/uL (ref 4.0–10.5)
nRBC: 0 % (ref 0.0–0.2)

## 2022-04-16 LAB — PHOSPHORUS: Phosphorus: 7.8 mg/dL — ABNORMAL HIGH (ref 2.5–4.6)

## 2022-04-16 LAB — PROCALCITONIN: Procalcitonin: 2.3 ng/mL

## 2022-04-16 MED ORDER — FENTANYL CITRATE (PF) 100 MCG/2ML IJ SOLN
25.0000 ug | INTRAMUSCULAR | Status: DC | PRN
  Administered 2022-04-16 – 2022-04-17 (×10): 100 ug via INTRAVENOUS
  Filled 2022-04-16 (×10): qty 2

## 2022-04-16 MED ORDER — FENTANYL CITRATE (PF) 100 MCG/2ML IJ SOLN: 25.0000 ug | INTRAMUSCULAR | Status: DC | PRN

## 2022-04-16 MED ORDER — HEPARIN SODIUM (PORCINE) 1000 UNIT/ML IJ SOLN
INTRAMUSCULAR | Status: AC
Start: 2022-04-16 — End: 2022-04-16
  Administered 2022-04-16: 1000 [IU]
  Filled 2022-04-16: qty 4

## 2022-04-16 NOTE — Progress Notes (Addendum)
[facility] ?Progress Note ? ?Subjective: seen in ICU, on vent. 3.5 L off w/ HD yesterday.  ? ?Vitals:  ? 04/16/22 0815 04/16/22 0830 04/16/22 0845 04/16/22 0900  ?BP:  121/83  117/79  ?Pulse: (!) 54 (!) 54 60 (!) 59  ?Resp: _0 ?Temp:      ?TempSrc:      ?SpO2: 99% 99% 99% 99%  ?Weight:      ?Height:      ? ? ?Exam: ?on vent ,sedated ? no jvd ? throat ett in place ? Chest cta bilat and lat ? Cor reg no RG ? Abd soft ntnd no ascites ?  Ext no LE edema ?  Neuro on vent and sedated, not following commands ?  LIJ TDC intact ?  ?  ? Home meds include - norvasc 10, auryxia 2 ac, percocet prn, prns/ vits/ supps ?  ? CXR 5/16 - IMPRESSION: moderate to severe cardiomegaly. Mild central vascular fullness appears similar. There are no overt edema findings. Small right pleural effusion and patchy consolidation in the right lower lung field continue to be noted. Left lung remains clear. The right lung opacities are slightly less confluent than previously but in all other respects there are no further changes ? ?  CXR 5/17 - looks a bit better  ? ? ? OP HD: [place] M-F (2 hd/ week) ? 3.5h  400/1.5  63.5kg  P2   Heparin 3000   LIJ TDC ?- mircera 200 ug q2, last 5/05, due 5/19 ?- venofer 156m q hd IV, 5/05 - 5/19 ?- hectorol 5 ug IV tiw ?  ?Assessment/ Plan: ?Acute hypoxic resp failure - on vent ?PNA - presumed HCAP, on IV vanc/ cefepime ?AMS - multifactorial due to acute infection, underlying memory issues.  ?R effusion - per CCM ?ESRD - on HD MWF. Had HD here yest, HD today to get back on schedule.  ?HTN/ vol - cont home meds per tube. 3.5 L off w/ HD yesterday. Lower vol again today w/ HD as tolerated.  ?Anemia esrd - Hb 11-12, next  esa due on 5/19.  Hold IV Fe w/ acute infection.  ?BMD ckd - CCa in range, phos is high. Resume binder when eating.  ?DNR ?  ? ? ? ? ?Rob SDoctor, hospital?04/16/2022, 10:42 AM ? ? ?Recent Labs  ?Lab 04/14/22 ?[id number]/15/23 ?1104 04/15/22 ?0428 04/16/22 ?0140  ?HGB 11.2*   < > 10.2*  9.9*  ?ALBUMIN 3.6  --   --   --   ?CALCIUM 9.0  --  8.4* 7.8*  ?PHOS  --    < > 10.6* 7.8*  ?CREATININE 10.69*  --  11.23* 7.86*  ?K 5.0   < > 5.3* 4.7  ? < > = values in this interval not displayed.  ? ? ?Inpatient medications: ? chlorhexidine gluconate (MEDLINE KIT)  15 mL Mouth Rinse BID  ? Chlorhexidine Gluconate Cloth  6 each Topical Q0600  ? [START ON 04/18/2022] darbepoetin (ARANESP) injection - DIALYSIS  200 mcg Intravenous Q Fri-HD  ? docusate  100 mg Per Tube BID  ? feeding supplement (PROSource TF)  45 mL Per Tube Daily  ? fentaNYL (SUBLIMAZE) injection  25 mcg Intravenous Once  ? heparin  5,000 Units Subcutaneous Q8H  ? mouth rinse  15 mL Mouth Rinse 10 times per day  ? multivitamin  1 tablet Per Tube QHS  ? pantoprazole sodium  40 mg Per Tube Daily  ? polyethylene glycol  17 g Per Tube Daily  ? ? azithromycin Stopped (04/15/22 1631)  ? ceFEPime (MAXIPIME) IV Stopped (04/15/22 1745)  ? feeding supplement (VITAL 1.5 CAL) 1,000 mL (04/15/22 1600)  ? fentaNYL infusion INTRAVENOUS Stopped (04/16/22 0732)  ? ?albuterol, docusate, fentaNYL, midazolam, midazolam, polyethylene glycol ? ? ? ? ? ? ?

## 2022-04-16 NOTE — Progress Notes (Addendum)
Pt receives out-pt HD at [facility] on Mon and Fri. Pt has a 10:15 chair time. Will assist as needed.   Melven Sartorius Renal Navigator [phone]

## 2022-04-16 NOTE — Progress Notes (Addendum)
? ?NAME:  Devin Harmon, MRN:  [id number], DOB:  06-12-1956, LOS: 2 ?ADMISSION DATE:  04/14/2022, CONSULTATION DATE:  04/14/22 ?REFERRING MD:  Regenia Skeeter CHIEF COMPLAINT:  Resp failure  ? ?History of Present Illness:  ?Devin Harmon is a 66 y.o. male who has a PMH as below including ESRD on HD MWF, last session Fri 5/12. He presented to [facility] ED 5/15 by EMS with reports of "feeling bad since Friday 5/12".  Family went and found him unresponsive on the couch in his home on morning of presentation.  On EMS arrival, he was minimally responsive and was hypoxic to the 60s; therefore, was placed on NRB for transport to ED. ? ?In ED, he required intubation for airway protection.  CXR demonstrated multifocal PNA on R with pleural effusion.  Head CT was ordered and is pending. Per chart review he has a hx of transudative right sided effusion 2/2 CHF and ESRD and volume typically managed with HD. ? ?Wife informed EDP that pt had been having a head cold and cough since 5/12.  He did go to dialysis that day and received treatment but did require oxygen while there which is not typical for him. ? ?Pertinent  Medical History:  ?has Malignant hypertension; Essential hypertension, benign; BPH without obstruction/lower urinary tract symptoms; Routine general medical examination at a health care facility; ESRD (end stage renal disease) ([place]); Allergic rhinitis due to pollen; Cognitive decline; Right hemiparesis ([place]); Recurrent right pleural effusion; Heart failure, systolic, chronic ([place]); Vascular dementia, uncomplicated ([place]); Malnutrition of mild degree ([place]); Advance directive discussed with patient; Anemia in chronic kidney disease; Hypertensive chronic kidney disease with stage 1 through stage 4 chronic kidney disease, or unspecified chronic kidney disease; Coagulation defect, unspecified ([place]); Complication of central venous catheter; Dependence on renal dialysis ([place]); Diarrhea, unspecified; Fluid overload, unspecified;  Hyperkalemia; Hyperosmolality and hypernatremia; Hypocalcemia; Iron deficiency anemia, unspecified; Pain, unspecified; Pruritus, unspecified; Secondary hyperparathyroidism of renal origin ([place]); Shortness of breath; Acute respiratory failure with hypoxia ([place]); Respiratory failure with hypoxia ([place]); Acute encephalopathy; Acute on chronic combined systolic and diastolic congestive heart failure ([place]); and Community acquired pneumonia of right lung on their problem list. ? ?Significant Hospital Events: ?Including procedures, antibiotic start and stop dates in addition to other pertinent events   ?5/15: admit; intubated for airway protection ? ? ?Interim History / Subjective:  ?Received HD yesterday ?On PSV this morning; fentanyl stopped this am ?Can MAE; follows some commands (wiggles toes, sticks tongue out); cough/gag present ? ?Objective:  ?Blood pressure 124/79, pulse (!) 55, temperature 98.5 ?F (36.9 ?C), temperature source Axillary, resp. rate 16, height 5\' 7"  (1.702 m), weight 62.2 kg, SpO2 100 %. ?   ?Vent Mode: PRVC ?FiO2 (%):  [40 %] 40 % ?Set Rate:  [16 bmp] 16 bmp ?Vt Set:  [520 mL] 520 mL ?PEEP:  [5 cmH20] 5 cmH20 ?Pressure Support:  [10 cmH20] 10 cmH20 ?Plateau Pressure:  [16 cmH20-22 cmH20] 22 cmH20  ? ?Intake/Output Summary (Last 24 hours) at 04/16/2022 0842 ?Last data filed at 04/16/2022 0600 ?Gross per 24 hour  ?Intake 1484.89 ml  ?Output 3500 ml  ?Net -2015.11 ml  ? ? ?Filed Weights  ? 04/15/22 1122 04/15/22 1504 04/16/22 0500  ?Weight: 61.6 kg 58.2 kg 62.2 kg  ? ? ?Examination: ?General:  critically ill appearing on mech vent ?HEENT: MM pink/moist; ETT in place ?Neuro: Can MAE; follows some commands (wiggles toes, sticks tongue out); cough/gag present ?CV: s1s2, brady 50s, no m/r/g ?PULM:  dim clear BS  bilaterally; on mech vent PRVC; R lung [place] below with small pleural effusion ? ? ?GI: soft, bsx4 active  ?Extremities: warm/dry, trace BLE edema  ?Skin: no rashes or lesions appreciated ? ?CXR: R  pleural effusion stable; LLL opacity present ? ?Labs/imaging personally reviewed:  ?CXR 5/15 > right sided PNA and effusion. ?CT head 5/15 >  ? ?Assessment & Plan:  ? ?Acute hypoxic respiratory failure - s/p intubation in ED. ?P: ?-will try SBT today and consider extubation if passes ?-Wean PEEP/FiO2 for SpO2 >92% ?-VAP bundle in place ?-PAD protocol in place ?-wean sedation for RASS goal 0 to -1 ?-CPT qid ?-Follow intermittent CXR and ABG PRN ? ?Presumed HCAP ?P: ?-cont cefepime/azithro for cap ppx ?-trend PCT ?-follow bcx2, trach culture ? ?Acute encephalopathy - unclear etiology, ? 2/2 hypoxia. ABG normal.  Did receive antihistamine overnight but doubt that would cause this degree of encephalopathy. ?-ct head 5/15: no acute infarction; ammonia and tsh WNL ?Hx of vascular dementia: per family baseline answers yes and no;  ?P: ?-limit sedating meds ?-wean sedation for RASS 0 to -1 ? ?Right sided pleural effusion - has hx of same 2/2 CHF and ESRD. Fluid removal typically via HD. ?P: ?- continue volume removal w/ HD per nephro ?- R pleural effusion remains stable; US showing minimal R pleural effusion; no thoracentesis indicated ?-trend CXR ? ?Hx ESRD on HD MWF (last session Fri 5/12) - no urgent needs currently. ?Hypocalcemia ?P: ?-nephro following ?-cont HD per nephro ?-Trend BMP / urinary output ?-Replace electrolytes as indicated ?-Avoid nephrotoxic agents, ensure adequate renal perfusion ? ?Hx combined CHF (Echo from July 2021 with EF 40-45%, G1DD), HTN. ?P: ?-HD per nephro ?-hold home anti-hypertensives while normotensive ? ? ?Best practice (evaluated daily):  ?Diet/type: NPO ?DVT prophylaxis: prophylactic heparin  ?GI prophylaxis: PPI ?Lines: N/A ?Foley:  N/A ?Code Status:  DNR ?Last date of multidisciplinary goals of care discussion: ipal 5/15 made DNR; 5/17 spoke with wife [place] at bedside and updated; she did state that Mr. Feagans would not want to be on ventilator long term with  tracheostomy. ? ? ?Critical care time: 35 min.  ? ? ?Mikki Harbor, PA-C ?[facility] ?04/16/2022, 8:42 AM ? ?Please see [email or web address] for pager details. ? ?From 7A-7P if no response, please call [phone]. ?After hours, please call Warren Lacy [phone]. ? ? ? ?

## 2022-04-17 ENCOUNTER — Inpatient Hospital Stay (HOSPITAL_COMMUNITY): Payer: Medicare Other

## 2022-04-17 DIAGNOSIS — Z7189 Other specified counseling: Secondary | ICD-10-CM

## 2022-04-17 DIAGNOSIS — J189 Pneumonia, unspecified organism: Secondary | ICD-10-CM | POA: Diagnosis not present

## 2022-04-17 LAB — BASIC METABOLIC PANEL
Anion gap: 12 (ref 5–15)
BUN: 36 mg/dL — ABNORMAL HIGH (ref 8–23)
CO2: 25 mmol/L (ref 22–32)
Calcium: 8.5 mg/dL — ABNORMAL LOW (ref 8.9–10.3)
Chloride: 100 mmol/L (ref 98–111)
Creatinine, Ser: 5.08 mg/dL — ABNORMAL HIGH (ref 0.61–1.24)
GFR, Estimated: 12 mL/min — ABNORMAL LOW (ref >60)
Glucose, Bld: 114 mg/dL — ABNORMAL HIGH (ref 70–99)
Potassium: 4.6 mmol/L (ref 3.5–5.1)
Sodium: 137 mmol/L (ref 135–145)

## 2022-04-17 LAB — GLUCOSE, CAPILLARY
Glucose-Capillary: 118 mg/dL — ABNORMAL HIGH (ref 70–99)
Glucose-Capillary: 104 mg/dL — ABNORMAL HIGH (ref 70–99)
Glucose-Capillary: 102 mg/dL — ABNORMAL HIGH (ref 70–99)
Glucose-Capillary: 97 mg/dL (ref 70–99)
Glucose-Capillary: 113 mg/dL — ABNORMAL HIGH (ref 70–99)
Glucose-Capillary: 103 mg/dL — ABNORMAL HIGH (ref 70–99)

## 2022-04-17 LAB — CBC
HCT: 32.5 % — ABNORMAL LOW (ref 39.0–52.0)
Hemoglobin: 10.3 g/dL — ABNORMAL LOW (ref 13.0–17.0)
MCH: 29.5 pg (ref 26.0–34.0)
MCHC: 31.7 g/dL (ref 30.0–36.0)
MCV: 93.1 fL (ref 80.0–100.0)
Platelets: 138 10*3/uL — ABNORMAL LOW (ref 150–400)
RBC: 3.49 MIL/uL — ABNORMAL LOW (ref 4.22–5.81)
RDW: 19.7 % — ABNORMAL HIGH (ref 11.5–15.5)
WBC: 9.2 10*3/uL (ref 4.0–10.5)
nRBC: 0 % (ref 0.0–0.2)

## 2022-04-17 LAB — MAGNESIUM: Magnesium: 2 mg/dL (ref 1.7–2.4)

## 2022-04-17 LAB — PHOSPHORUS: Phosphorus: 5.9 mg/dL — ABNORMAL HIGH (ref 2.5–4.6)

## 2022-04-17 MED ORDER — DARBEPOETIN ALFA 200 MCG/0.4ML IJ SOSY
200.0000 ug | PREFILLED_SYRINGE | INTRAMUSCULAR | Status: DC
Start: 2022-04-18 — End: 2022-04-17

## 2022-04-17 MED ORDER — ORAL CARE MOUTH RINSE
15.0000 mL | Freq: Two times a day (BID) | OROMUCOSAL | Status: DC
  Administered 2022-04-17 – 2022-04-19 (×5): 15 mL via OROMUCOSAL

## 2022-04-17 NOTE — IPAL (Signed)
  Interdisciplinary Goals of Care Family Meeting   Date carried out:: 04/17/2022  Location of the meeting: Bedside  Member's involved: Family Member or next of kin and Other: PA-C  Durable Power of Tour manager: Wife [place]   Discussion: We discussed goals of care for [employer] .  Spoke with wife [place] at bedside. Discussed about possibly extubating today. Wanted to discuss plan about if his breathing worsens after extubation would the patient want reintubation. Patient is already DNR. Wife states that he would not want to be trached and be on long term ventilation. She states if his breathing worsens that she would not want reintubation for her husband and does not want him to suffer. She would want comfort measures if his respiratory status worsens. She would also prefer him to be transferred home if he doesn't improve for in-home hospice.  Code status: Full DNR  Disposition: Continue current acute care; if patient does not improve would like to consider in-home hospic   Time spent for the meeting: 40 minutes  Mick Sell 04/17/2022, 9:12 AM

## 2022-04-17 NOTE — Procedures (Signed)
Extubation Procedure Note  Patient Details:   Name: Devin Harmon DOB: February 18, 1956 MRN: [id number]   Airway Documentation:    Vent end date: 04/17/22 Vent end time: 1034   Evaluation  O2 sats: stable throughout Complications: No apparent complications Patient did tolerate procedure well. Bilateral Breath Sounds: Rhonchi   No  Positive cuff leak noted. Patient placed on [place] 4L with humidity, no stridor noted. Patient not following commands for the incentive spirometer at this time. Family and RN at bedside.  Bayard Beaver 04/17/2022, 10:42 AM

## 2022-04-17 NOTE — Progress Notes (Signed)
Nutrition Follow-up  DOCUMENTATION CODES:   Severe malnutrition in context of chronic illness  INTERVENTION:   - Add Nepro Shake po TID, each supplement provides 425 kcal and 19 grams protein  - Continue renal MVI daily  - Encourage PO intake and provide feeding assistance as needed  NUTRITION DIAGNOSIS:   Severe Malnutrition related to chronic illness (ESRD, CHF, vascular dementia) as evidenced by moderate fat depletion, severe muscle depletion, percent weight loss (7.2% weight loss in 2 months).  Ongoing, being addressed via diet advancement and oral nutrition supplements  GOAL:   Patient will meet greater than or equal to 90% of their needs  Progressing  MONITOR:   PO intake, Supplement acceptance, Diet advancement, Labs, Weight trends, I & O's  REASON FOR ASSESSMENT:   Ventilator, Consult Enteral/tube feeding initiation and management  ASSESSMENT:   66 year old male who presented to the ED on 5/15 after being found unresponsive by family. PMH of ESRD on HD, HTN, CHF, vascular dementia, anemia. Pt admitted with acute respiratory failure with hypoxia requiring intubation, multifocal PNA with R pleural effusion, acute encephalopathy.  05/18 - extubated, diet advanced to dysphagia 1 with thin liquids 05/19 - diet advanced to dysphagia 2 with thin liquids  Discussed pt with RN and during ICU rounds. Pt with poor PO intake of dysphagia 1 breakfast meal tray this morning, stating he does not like the food. SLP saw pt again today and diet was advanced to dysphagia 2.  Spoke with pt at bedside. Pt unable to provide any details regarding PO intake. RD to order oral nutrition supplements to aid pt in meeting kcal and protein needs. Continue renal MVI daily.  Current weight is well below EDW. Suspect that EDW will need to be lowered at outpatient HD facility.  EDW: 63.5 kg Admit weight: 66.4 kg Current weight: 61 kg  Meal Completion: 25% x 1 documented meal this  AM  Medications reviewed and include: aranesp, colace, rena-vit, miralax, IV abx  Labs reviewed: phosphorus 6.5 CBG's: 83-113 x 24 hours  I/O's: -1.1 L since admit  Diet Order:   Diet Order             DIET DYS 2 Room service appropriate? Yes; Fluid consistency: Thin  Diet effective now                   EDUCATION NEEDS:   Not appropriate for education at this time  Skin:  Skin Assessment: Skin Integrity Issues: Incisions: RLE, LLE  Last BM:  04/17/22  large type 6  Height:   Ht Readings from Last 1 Encounters:  04/14/22 5\' 7"  (1.702 m)    Weight:   Wt Readings from Last 1 Encounters:  04/18/22 61 kg    BMI:  Body mass index is 21.06 kg/m.  Estimated Nutritional Needs:   Kcal:  1800-2000  Protein:  90-110 grams  Fluid:  1000 ml + UOP    Gustavus Bryant, MS, RD, LDN Inpatient Clinical Dietitian Please see AMiON for contact information.

## 2022-04-17 NOTE — TOC Progression Note (Signed)
Transition of Care [facility]) - Progression Note    Patient Details  Name: NACHMAN SUNDT MRN: [id number] Date of Birth: [birth year]  [facility] CM/SW Contact  Tom-Johnson, Renea Ee, RN Phone Number: 04/17/2022, 4:27 PM  Clinical Narrative:     Extubated today and on [place] 4L with humidity. PT recommends SNF. TOC will continue to follow with needs.    Barriers to Discharge: Continued Medical Work up  Expected Discharge Plan and Services     Discharge Planning Services: CM Consult   Living arrangements for the past 2 months: Single Family Home                                       Social Determinants of Health (SDOH) Interventions    Readmission Risk Interventions     View : No data to display.

## 2022-04-17 NOTE — Progress Notes (Signed)
[facility] Progress Note  Subjective: seen in ICU. More awake and fidgety today. Had HD yest w/ 2.5 L off.   Vitals:   04/17/22 1030 04/17/22 1038 04/17/22 1045 04/17/22 1100  BP: (!) 148/86 (!) 148/86  132/88  Pulse: 70 67 65 61  Resp: (!) 28 19 (!) 23 18  Temp:      TempSrc:      SpO2: 95% 98% 98% 100%  Weight:      Height:        Exam: on vent ,sedated  no jvd  throat ett in place  Chest cta bilat and lat  Cor reg no RG  Abd soft ntnd no ascites   Ext no LE edema   Neuro on vent and sedated, not following commands   LIJ TDC intact      Home meds include - norvasc 10, auryxia 2 ac, percocet prn, prns/ vits/ supps    CXR 5/16 - IMPRESSION: moderate to severe cardiomegaly. Mild central vascular fullness appears similar. There are no overt edema findings. Small right pleural effusion and patchy consolidation in the right lower lung field continue to be noted.    OP HD: [place] M-F (2 hd/ week)  3.5h  400/1.5  63.5kg  P2   Heparin 3000   LIJ TDC - mircera 200 ug q2, last 5/05, due 5/19 - venofer 174m q hd IV, 5/05 - 5/19 - hectorol 5 ug IV tiw   Assessment/ Plan: Acute hypoxic resp failure - on vent PNA - presumed HCAP, on IV vanc/ cefepime AMS - multifactorial due to acute infection, underlying memory issues.  R effusion - per CCM ESRD - on HD MWF. Had HD here Tuesday and Wed off schedule. Next HD Friday, then resume Mon / Fri schedule (2x per wk).  HTN/ vol - cont home meds per tube. UF 3.5 and 2.5 w/ HD x2 here, 7 kg under dry wt. BP's low normal. Will lower edw upon dc. Will not need further vol reduction, euvolemic now.  Anemia esrd - Hb 11-12, next  esa due on 5/19 have ordered.  Holding IV Fe w/ acute infection.  BMD ckd - CCa in range, phos is high. Resume binder when eating.  DNR       Devin Harmon 04/17/2022, 11:10 AM   Recent Labs  Lab 04/14/22 0937 04/14/22 1104 04/16/22 0140 04/17/22 0320  HGB 11.2*   < > 9.9* 10.3*  ALBUMIN 3.6   --   --   --   CALCIUM 9.0   < > 7.8* 8.5*  PHOS  --    < > 7.8* 5.9*  CREATININE 10.69*   < > 7.86* 5.08*  K 5.0   < > 4.7 4.6   < > = values in this interval not displayed.    Inpatient medications:  chlorhexidine gluconate (MEDLINE KIT)  15 mL Mouth Rinse BID   Chlorhexidine Gluconate Cloth  6 each Topical Q0600   [START ON 04/18/2022] darbepoetin (ARANESP) injection - DIALYSIS  200 mcg Intravenous Q Fri-HD   docusate  100 mg Per Tube BID   feeding supplement (PROSource TF)  45 mL Per Tube Daily   heparin  5,000 Units Subcutaneous Q8H   mouth rinse  15 mL Mouth Rinse 10 times per day   multivitamin  1 tablet Per Tube QHS   polyethylene glycol  17 g Per Tube Daily    azithromycin Stopped (04/16/22 1543)   ceFEPime (MAXIPIME) IV Stopped (04/16/22 1818)  feeding supplement (VITAL 1.5 CAL) Stopped (04/17/22 0410)   albuterol, docusate, polyethylene glycol

## 2022-04-17 NOTE — Progress Notes (Signed)
Pharmacy Antibiotic Note  Devin Harmon is a 66 y.o. male admitted on 04/14/2022 with pneumonia.  Pharmacy has been consulted for cefepime dosing.  ESRD on HD MF and tolerating.  Afebrile, WBC WNL.  Plan: Continue cefepime 1gm IV Q24H through 5/21 Continue azithromycin 250mg  IV Q24H through 5/19 Pharmacy will sign off.  Thank you for the consult!   Height: 5\' 7"  (170.2 cm) Weight: 56.2 kg (123 lb 14.4 oz) IBW/kg (Calculated) : 66.1  Temp (24hrs), Avg:98.2 F (36.8 C), Min:97.5 F (36.4 C), Max:99.2 F (37.3 C)  Recent Labs  Lab 04/14/22 0937 04/14/22 1007 04/15/22 0428 04/16/22 0140 04/17/22 0320  WBC 10.2  --  8.4 8.6 9.2  CREATININE 10.69*  --  11.23* 7.86* 5.08*  LATICACIDVEN  --  1.6  --   --   --      Estimated Creatinine Clearance: 11.4 mL/min (A) (by C-G formula based on SCr of 5.08 mg/dL (H)).    Allergies  Allergen Reactions   Sulfa Antibiotics Hives   Sulfonamide Derivatives Hives   Oxycodone     Hallucination   Chlorthalidone Other (See Comments)    felt "bad" and ED    Hydrocodone Nausea Only    dizzy    Vanc x1 5/15 Cefepime 5/15 >> 5/21 Azith 5/15 >> 5/19 Rocephin x1 5/15   5/15 UCx -  5/15 BCx - NGTD 5/15 TA - GPC / GNR / GPC on Gram stain 5/15 MRSA PCR - negative   Myrle Wanek D. Mina Marble, PharmD, BCPS, [place] 04/17/2022, 10:57 AM

## 2022-04-17 NOTE — Evaluation (Signed)
Physical Therapy Evaluation Patient Details Name: Devin Harmon MRN: [id number] DOB: [birth year] Today's Date: 04/17/2022  History of Present Illness  66 yo admitted 5/15 after found unresponsive at home with AMS and respiratory failure. Intubated 5/15-5/18. PMhx: ESRD, BPH, HFrEF, HTN, cognitive decline  Clinical Impression  Pt with flat affect able to respond to name called and would say "lets do it" when asked to get up but then he quickly loses attention and requires redirection. Family not present during eval to state how he moves and gets to HD. Pt currently max assist for transfers and attempts at standing limited by cognition and posterior lean. Pt with decreased strength, function and balance who will benefit from acute therapy to maximize mobility and safety to decrease burden of care.  SpO2 95% on 3L     Recommendations for follow up therapy are one component of a multi-disciplinary discharge planning process, led by the attending physician.  Recommendations may be updated based on patient status, additional functional criteria and insurance authorization.  Follow Up Recommendations Skilled nursing-short term rehab (<3 hours/day)    Assistance Recommended at Discharge Frequent or constant Supervision/Assistance  Patient can return home with the following  A lot of help with walking and/or transfers;A lot of help with bathing/dressing/bathroom;Assistance with cooking/housework;Direct supervision/assist for financial management;Assist for transportation;Assistance with feeding    Equipment Recommendations Other (comment) (TBD with progression)  Recommendations for Other Services       Functional Status Assessment Patient has had a recent decline in their functional status and/or demonstrates limited ability to make significant improvements in function in a reasonable and predictable amount of time     Precautions / Restrictions Precautions Precautions:  Fall Restrictions Weight Bearing Restrictions: No      Mobility  Bed Mobility Overal bed mobility: Needs Assistance Bed Mobility: Supine to Sit, Sit to Supine     Supine to sit: Mod assist Sit to supine: Mod assist   General bed mobility comments: physical assist to bring legs off of bed and elevate trunk. Return to bed with assist to lift legs and position hips    Transfers Overall transfer level: Needs assistance   Transfers: Sit to/from Stand Sit to Stand: Max assist           General transfer comment: max assist x 3 trials to stand from EOB with pad and belt with knee blocked. pt maintains flexed hips and knees with posterior lean in standing. Unable to achieve fully upright with or without RW and could not step. Total assist with pad to scoot toward [facility]    Ambulation/Gait               General Gait Details: unable at this time  Stairs            Wheelchair Mobility    Modified Rankin (Stroke Patients Only)       Balance Overall balance assessment: Needs assistance Sitting-balance support: Feet unsupported, Feet supported, No upper extremity supported Sitting balance-Leahy Scale: Poor Sitting balance - Comments: min assist for sitting balance with posterior lean Postural control: Posterior lean Standing balance support: Bilateral upper extremity supported Standing balance-Leahy Scale: Poor Standing balance comment: max assist to stand with use of pad                             Pertinent Vitals/Pain Pain Assessment Pain Assessment: PAINAD Breathing: normal Negative Vocalization: none Facial Expression: smiling or inexpressive Body Language:  relaxed Consolability: no need to console PAINAD Score: 0    Home Living Family/patient expects to be discharged to:: Private residence Living Arrangements: Spouse/significant other Available Help at Discharge: Family Type of Home: House Home Access: Stairs to enter Entrance  Stairs-Rails: Chemical engineer of Steps: 5   Home Layout: One level   Additional Comments: pt unable to provide and wife OOR for lunch. Home setup pulled from prior admission and needs to be confirmed    Prior Function Prior Level of Function : Needs assist                     Hand Dominance        Extremity/Trunk Assessment   Upper Extremity Assessment Upper Extremity Assessment: Generalized weakness    Lower Extremity Assessment Lower Extremity Assessment: Generalized weakness    Cervical / Trunk Assessment Cervical / Trunk Assessment: Kyphotic  Communication      Cognition Arousal/Alertness: Awake/alert Behavior During Therapy: Flat affect Overall Cognitive Status: No family/caregiver present to determine baseline cognitive functioning Area of Impairment: Orientation, Attention, Following commands                   Current Attention Level: Focused   Following Commands: Follows one step commands inconsistently       General Comments: pt able to lift legs on command with multimodal cues and assist to rise with multimodal cues and significant assist to initiate        General Comments      Exercises     Assessment/Plan    PT Assessment Patient needs continued PT services  PT Problem List Decreased strength;Decreased mobility;Decreased activity tolerance;Decreased balance;Decreased cognition;Decreased knowledge of use of DME;Decreased coordination;Decreased safety awareness;Cardiopulmonary status limiting activity       PT Treatment Interventions DME instruction;Therapeutic exercise;Balance training;Gait training;Functional mobility training;Therapeutic activities;Patient/family education;Cognitive remediation;Neuromuscular re-education    PT Goals (Current goals can be found in the Care Plan section)  Acute Rehab PT Goals PT Goal Formulation: Patient unable to participate in goal setting    Frequency Min 2X/week      Co-evaluation               AM-PAC PT "6 Clicks" Mobility  Outcome Measure Help needed turning from your back to your side while in a flat bed without using bedrails?: A Lot Help needed moving from lying on your back to sitting on the side of a flat bed without using bedrails?: A Lot Help needed moving to and from a bed to a chair (including a wheelchair)?: Total Help needed standing up from a chair using your arms (e.g., wheelchair or bedside chair)?: Total Help needed to walk in hospital room?: Total Help needed climbing 3-5 steps with a railing? : Total 6 Click Score: 8    End of Session Equipment Utilized During Treatment: Gait belt Activity Tolerance: Patient limited by fatigue Patient left: in bed;with call bell/phone within reach;with bed alarm set;with nursing/sitter in room Nurse Communication: Mobility status PT Visit Diagnosis: Other abnormalities of gait and mobility (R26.89);Difficulty in walking, not elsewhere classified (R26.2);Muscle weakness (generalized) (M62.81)    Time: 9597-4718 PT Time Calculation (min) (ACUTE ONLY): 16 min   Charges:   PT Evaluation $PT Eval Moderate Complexity: 1 Mod          Nereyda Bowler P, PT Acute Rehabilitation Services Pager: [phone] Office: [phone]   Mattye Verdone B Alexius Hangartner 04/17/2022, 1:34 PM

## 2022-04-17 NOTE — Evaluation (Signed)
Clinical/Bedside Swallow Evaluation Patient Details  Name: Devin Harmon MRN: [id number] Date of Birth: 20-Dec-1955  Today's Date: 04/17/2022 Time: SLP Start Time (ACUTE ONLY): 69 SLP Stop Time (ACUTE ONLY): 1405 SLP Time Calculation (min) (ACUTE ONLY): 20 min  Past Medical History:  Past Medical History:  Diagnosis Date   Allergy    Anemia    BPH (benign prostatic hypertrophy)    ESRD (end stage renal disease) ([place])    [place] [facility]   HFrEF (heart failure with reduced ejection fraction) ([place]) 06/27/2020   Echo 06/27/20: LVEF 40-45%, hypokinesis basal to mid inferior myocardium   Hypertension    Leg weakness    right   Slurred speech 02/07/2020   ? cva per Dr Silvio Pate, and cognitive changes   [place] 2007   facial, hospital 2007   Past Surgical History:  Past Surgical History:  Procedure Laterality Date   A/V FISTULAGRAM N/A 12/19/2019   Procedure: A/V FISTULAGRAM - Right Arm;  Surgeon: Waynetta Sandy, MD;  Location: [place] [facility];  Service: Cardiovascular;  Laterality: N/A;   A/V FISTULAGRAM Left 03/15/2020   Procedure: A/V FISTULAGRAM;  Surgeon: Marty Heck, MD;  Location: [place] [facility];  Service: Cardiovascular;  Laterality: Left;   AV FISTULA PLACEMENT Right 09/01/2019   Procedure: ARTERIOVENOUS (AV) FISTULA CREATION RIGHT ARM;  Surgeon: Marty Heck, MD;  Location: [place];  Service: Vascular;  Laterality: Right;   AV FISTULA PLACEMENT Left 02/09/2020   Procedure: LEFT ARM BASILIC VEIN Arteriovenous FISTULA  CREATION;  Surgeon: Marty Heck, MD;  Location: [place];  Service: Vascular;  Laterality: Left;   BASCILIC VEIN TRANSPOSITION Left 05/17/2020   Procedure: LEFT ARM SECOND STAGE [place];  Surgeon: Marty Heck, MD;  Location: [place];  Service: Vascular;  Laterality: Left;   DIALYSIS/PERMA CATHETER INSERTION N/A 04/15/2019   Procedure: DIALYSIS/PERMA CATHETER INSERTION;  Surgeon: Katha Cabal, MD;  Location: [place] [facility];  Service: Cardiovascular;  Laterality: N/A;   INSERTION OF DIALYSIS CATHETER Left 11/15/2019   Procedure: INSERTION OF LEFT TUNNEL DIALYSIS CATHETER;  Surgeon: Waynetta Sandy, MD;  Location: [place];  Service: Vascular;  Laterality: Left;   LIGATION OF ARTERIOVENOUS  FISTULA Right 01/03/2020   Procedure: LIGATION OF ARTERIOVENOUS  FISTULA;  Surgeon: Serafina Mitchell, MD;  Location: [place];  Service: Vascular;  Laterality: Right;   LIGATION OF ARTERIOVENOUS  FISTULA Left 02/27/2022   Procedure: LIGATION OF LEFT ARM FISTULA;  Surgeon: Waynetta Sandy, MD;  Location: [place];  Service: Vascular;  Laterality: Left;   PERIPHERAL VASCULAR BALLOON ANGIOPLASTY Left 03/15/2020   Procedure: PERIPHERAL VASCULAR BALLOON ANGIOPLASTY;  Surgeon: Marty Heck, MD;  Location: [place] [facility];  Service: Cardiovascular;  Laterality: Left;  UPPER ARM   PERIPHERAL VASCULAR INTERVENTION Right 12/19/2019   Procedure: PERIPHERAL VASCULAR INTERVENTION;  Surgeon: Waynetta Sandy, MD;  Location: [place] [facility];  Service: Cardiovascular;  Laterality: Right;   REMOVAL OF A DIALYSIS CATHETER Right 11/15/2019   Procedure: REMOVAL OF A RIGHT TUNNEL DIALYSIS CATHETER;  Surgeon: Waynetta Sandy, MD;  Location: [place];  Service: Vascular;  Laterality: Right;   HPI:  66 yo admitted 5/15 after found unresponsive at home with AMS and respiratory failure. Intubated 5/15-5/18. PMhx: ESRD, BPH, HFrEF, HTN, cognitive decline.    Assessment / Plan / Recommendation  Clinical Impression  Pt was seen for a bedside swallow evaluation in the setting of recent extubation.  Pt was encountered awake but lethargic in bed with wife present at bedside.  Vocal quality was noted to be clear with decreased vocal intensity; however, wife reported that this is the pt's baseline.  Pt with difficulty following commands to complete oral motor examination, but suspect generalized oral weakness.   Missing dentition was noted.  Pt consumed trials of ice chips, thin liquid, puree, and regular solids.  Mastication of regular solids was moderately prolonged with moderate oral residue noted on the anterior and posterior lingual surface.  Pt was able to clear residue with a cued liquid wash.  No overt s/sx of aspiration were noted with any PO trials during this evaluation.  Recommend initiation of Dysphagia 1 (puree) solids and thin liquids with medication administered crushed in puree.  SLP will f/u to monitor diet tolerance and advance solids as appropriate.  SLP Visit Diagnosis: Dysphagia, oral phase (R13.11)    Aspiration Risk  Mild aspiration risk    Diet Recommendation Thin liquid;Dysphagia 1 (Puree)   Liquid Administration via: Straw Medication Administration: Crushed with puree Supervision: Staff to assist with self feeding;Full supervision/cueing for compensatory strategies Compensations: Minimize environmental distractions;Slow rate;Small sips/bites Postural Changes: Seated upright at 90 degrees    Other  Recommendations Oral Care Recommendations: Oral care BID;Staff/trained caregiver to provide oral care    Recommendations for follow up therapy are one component of a multi-disciplinary discharge planning process, led by the attending physician.  Recommendations may be updated based on patient status, additional functional criteria and insurance authorization.  Follow up Recommendations No SLP follow up      Assistance Recommended at Discharge Frequent or constant Supervision/Assistance  Functional Status Assessment Patient has had a recent decline in their functional status and demonstrates the ability to make significant improvements in function in a reasonable and predictable amount of time.  Frequency and Duration min 2x/week  2 weeks       Prognosis Prognosis for Safe Diet Advancement: Good Barriers to Reach Goals: Cognitive deficits      Swallow Study   General  HPI: 66 yo admitted 5/15 after found unresponsive at home with AMS and respiratory failure. Intubated 5/15-5/18. PMhx: ESRD, BPH, HFrEF, HTN, cognitive decline. Type of Study: Bedside Swallow Evaluation Previous Swallow Assessment: N/A Diet Prior to this Study: NPO Temperature Spikes Noted: No Respiratory Status: Nasal cannula History of Recent Intubation: Yes Length of Intubations (days): 3 days Date extubated: 04/17/22 Behavior/Cognition: Lethargic/Drowsy;Cooperative;Confused;Requires cueing;Pleasant mood Oral Cavity Assessment: Within Functional Limits Oral Care Completed by SLP: No Oral Cavity - Dentition: Missing dentition Vision: Functional for self-feeding Self-Feeding Abilities: Needs assist Patient Positioning: Upright in bed Baseline Vocal Quality: Low vocal intensity Volitional Cough: Cognitively unable to elicit Volitional Swallow: Unable to elicit    Oral/Motor/Sensory Function Overall Oral Motor/Sensory Function: Other (comment) (Pt unable to follow commands to complete oral mech exam)   Ice Chips Ice chips: Within functional limits Presentation: Spoon   Thin Liquid Thin Liquid: Within functional limits Presentation: Straw;Spoon    Nectar Thick Nectar Thick Liquid: Not tested   Honey Thick Honey Thick Liquid: Not tested   Puree Puree: Within functional limits   Solid     Solid: Impaired Presentation: Spoon Oral Phase Impairments: Impaired mastication Oral Phase Functional Implications: Oral residue;Prolonged oral transit Pharyngeal Phase Impairments: Suspected delayed Swallow     Bretta Bang, M.S., [place] Office: [phone]  [place] 04/17/2022,2:23 PM

## 2022-04-17 NOTE — Progress Notes (Signed)
NAME:  SAYLOR SHECKLER, MRN:  [id number], DOB:  11/15/1956, LOS: 3 ADMISSION DATE:  04/14/2022, CONSULTATION DATE:  04/14/22 REFERRING MD:  Regenia Skeeter CHIEF COMPLAINT:  Resp failure   History of Present Illness:  SEYDOU HEARNS is a 66 y.o. male who has a PMH as below including ESRD on HD MWF, last session Fri 5/12. He presented to [facility] ED 5/15 by EMS with reports of "feeling bad since Friday 5/12".  Family went and found him unresponsive on the couch in his home on morning of presentation.  On EMS arrival, he was minimally responsive and was hypoxic to the 60s; therefore, was placed on NRB for transport to ED.  In ED, he required intubation for airway protection.  CXR demonstrated multifocal PNA on R with pleural effusion.  Head CT was ordered and is pending. Per chart review he has a hx of transudative right sided effusion 2/2 CHF and ESRD and volume typically managed with HD.  Wife informed EDP that pt had been having a head cold and cough since 5/12.  He did go to dialysis that day and received treatment but did require oxygen while there which is not typical for him.  Pertinent  Medical History:  has Malignant hypertension; Essential hypertension, benign; BPH without obstruction/lower urinary tract symptoms; Routine general medical examination at a health care facility; ESRD (end stage renal disease) ([place]); Allergic rhinitis due to pollen; Cognitive decline; Right hemiparesis ([place]); Recurrent right pleural effusion; Heart failure, systolic, chronic ([place]); Vascular dementia, uncomplicated ([place]); Malnutrition of mild degree ([place]); Advance directive discussed with patient; Anemia in chronic kidney disease; Hypertensive chronic kidney disease with stage 1 through stage 4 chronic kidney disease, or unspecified chronic kidney disease; Coagulation defect, unspecified ([place]); Complication of central venous catheter; Dependence on renal dialysis ([place]); Diarrhea, unspecified; Fluid overload, unspecified;  Hyperkalemia; Hyperosmolality and hypernatremia; Hypocalcemia; Iron deficiency anemia, unspecified; Pain, unspecified; Pruritus, unspecified; Secondary hyperparathyroidism of renal origin ([place]); Shortness of breath; Acute respiratory failure with hypoxia ([place]); Respiratory failure with hypoxia ([place]); Acute encephalopathy; Acute on chronic combined systolic and diastolic congestive heart failure ([place]); Community acquired pneumonia of right lung; and Protein-calorie malnutrition, severe on their problem list.  Significant Hospital Events: Including procedures, antibiotic start and stop dates in addition to other pertinent events   5/15: admit; intubated for airway protection 5/16: HD 5/17: HD 5/18: on SBT; Consider extubation?   Interim History / Subjective:  Received HD yesterday On SBT this am Off sedation; able to MAE; follows some commands intermittently(wiggles toes); cough/gag present Moderate frothy secretions from ETT  Objective:  Blood pressure 130/85, pulse (!) 54, temperature 99.2 F (37.3 C), temperature source Oral, resp. rate (!) 22, height 5\' 7"  (1.702 m), weight 56.2 kg, SpO2 99 %.    Vent Mode: PRVC FiO2 (%):  [40 %] 40 % Set Rate:  [16 bmp] 16 bmp Vt Set:  [530 mL] 530 mL PEEP:  [5 cmH20] 5 cmH20 Pressure Support:  [5 cmH20] 5 cmH20 Plateau Pressure:  [20 cmH20-23 cmH20] 22 cmH20   Intake/Output Summary (Last 24 hours) at 04/17/2022 4196 Last data filed at 04/17/2022 0636 Gross per 24 hour  Intake 1616.55 ml  Output 2630 ml  Net -1013.45 ml    Filed Weights   04/16/22 1227 04/16/22 1550 04/17/22 0500  Weight: 62.2 kg 59.7 kg 56.2 kg    Examination: General:  critically elderly male ill appearing on mech vent HEENT: MM pink/moist; ETT in place Neuro: albe to MAE; follows some commands intermittently (  wiggles toes); cough/gag present CV: s1s2, no m/r/g PULM:  dim clear BS bilaterally; on mech vent PSV; moderate frothy secretions from ETT GI: soft, bsx4  active  Extremities: warm/dry, trace BLE edema  Skin: no rashes or lesions appreciated  Labs/imaging personally reviewed:   Assessment & Plan:   Acute hypoxic respiratory failure - s/p intubation in ED. P: -SBT today and consider extubation today if passes -will have discussion with wife about one way extubation  -Wean PEEP/FiO2 for SpO2 >92% -VAP bundle in place -PAD protocol in place -hold sedation for RASS goal 0 to -1 -CPT qid -Follow intermittent CXR  Presumed HCAP P: -cont cefepime/azithro x 7 days for cap ppx -trend CXR -follow bcx2, trach culture  Acute encephalopathy - unclear etiology, ? 2/2 hypoxia. ABG normal.  Did receive antihistamine overnight but doubt that would cause this degree of encephalopathy. -ct head 5/15: no acute infarction; ammonia and tsh WNL Hx of vascular dementia: per family baseline answers yes and no;  P: -limit sedating meds -wean sedation for RASS 0 to -1  Right sided pleural effusion - has hx of same 2/2 CHF and ESRD. Fluid removal typically via HD. P: -continue volume removal w/ HD per nephro -trend CXR  Hx ESRD on HD MWF (last session Fri 5/12) - no urgent needs currently. Hypocalcemia P: -nephro following -cont HD per nephro -Trend BMP / urinary output -Replace electrolytes as indicated -Avoid nephrotoxic agents, ensure adequate renal perfusion  Hx combined CHF (Echo from July 2021 with EF 40-45%, G1DD), HTN. P: -HD per nephro -hold home anti-hypertensives while normotensive -strict I/O's; daily weights   Best practice (evaluated daily):  Diet/type: NPO DVT prophylaxis: prophylactic heparin  GI prophylaxis: PPI Lines: N/A Foley:  N/A Code Status:  DNR Last date of multidisciplinary goals of care discussion: ipal 5/15 made DNR; 5/17 spoke with wife [place] at bedside and updated; she did state that Mr. Gaffey would not want to be on ventilator long term with tracheostomy.   Critical care time: 35 min.    JD Rexene Agent [facility] 04/17/2022, 7:12 AM  Please see [email or web address] for pager details.  From 7A-7P if no response, please call [phone]. After hours, please call ELink [phone].

## 2022-04-18 ENCOUNTER — Inpatient Hospital Stay (HOSPITAL_COMMUNITY): Payer: Medicare Other

## 2022-04-18 DIAGNOSIS — N186 End stage renal disease: Secondary | ICD-10-CM | POA: Diagnosis not present

## 2022-04-18 DIAGNOSIS — J9601 Acute respiratory failure with hypoxia: Secondary | ICD-10-CM | POA: Diagnosis not present

## 2022-04-18 DIAGNOSIS — J189 Pneumonia, unspecified organism: Secondary | ICD-10-CM | POA: Diagnosis not present

## 2022-04-18 LAB — CBC
HCT: 34.7 % — ABNORMAL LOW (ref 39.0–52.0)
Hemoglobin: 10.6 g/dL — ABNORMAL LOW (ref 13.0–17.0)
MCH: 28.5 pg (ref 26.0–34.0)
MCHC: 30.5 g/dL (ref 30.0–36.0)
MCV: 93.3 fL (ref 80.0–100.0)
Platelets: 186 10*3/uL (ref 150–400)
RBC: 3.72 MIL/uL — ABNORMAL LOW (ref 4.22–5.81)
RDW: 19.1 % — ABNORMAL HIGH (ref 11.5–15.5)
WBC: 9.5 10*3/uL (ref 4.0–10.5)
nRBC: 0 % (ref 0.0–0.2)

## 2022-04-18 LAB — GLUCOSE, CAPILLARY
Glucose-Capillary: 100 mg/dL — ABNORMAL HIGH (ref 70–99)
Glucose-Capillary: 83 mg/dL (ref 70–99)
Glucose-Capillary: 94 mg/dL (ref 70–99)
Glucose-Capillary: 94 mg/dL (ref 70–99)
Glucose-Capillary: 94 mg/dL (ref 70–99)

## 2022-04-18 LAB — BASIC METABOLIC PANEL
Anion gap: 15 (ref 5–15)
BUN: 54 mg/dL — ABNORMAL HIGH (ref 8–23)
CO2: 24 mmol/L (ref 22–32)
Calcium: 9.1 mg/dL (ref 8.9–10.3)
Chloride: 99 mmol/L (ref 98–111)
Creatinine, Ser: 6.48 mg/dL — ABNORMAL HIGH (ref 0.61–1.24)
GFR, Estimated: 9 mL/min — ABNORMAL LOW (ref >60)
Glucose, Bld: 102 mg/dL — ABNORMAL HIGH (ref 70–99)
Potassium: 4.9 mmol/L (ref 3.5–5.1)
Sodium: 138 mmol/L (ref 135–145)

## 2022-04-18 LAB — PHOSPHORUS: Phosphorus: 6.5 mg/dL — ABNORMAL HIGH (ref 2.5–4.6)

## 2022-04-18 LAB — MAGNESIUM: Magnesium: 2.3 mg/dL (ref 1.7–2.4)

## 2022-04-18 MED ORDER — FERRIC CITRATE 1 GM 210 MG(FE) PO TABS
420.0000 mg | ORAL_TABLET | Freq: Every day | ORAL | Status: DC
  Administered 2022-04-18: 420 mg via ORAL
  Filled 2022-04-18 (×2): qty 2

## 2022-04-18 MED ORDER — HEPARIN SODIUM (PORCINE) 1000 UNIT/ML IJ SOLN
INTRAMUSCULAR | Status: AC
  Filled 2022-04-18: qty 4

## 2022-04-18 MED ORDER — POLYETHYLENE GLYCOL 3350 17 G PO PACK: 17.0000 g | PACK | Freq: Every day | ORAL | Status: DC

## 2022-04-18 MED ORDER — AMLODIPINE BESYLATE 5 MG PO TABS
5.0000 mg | ORAL_TABLET | Freq: Every day | ORAL | Status: DC
  Administered 2022-04-18 – 2022-04-19 (×2): 5 mg via ORAL
  Filled 2022-04-18 (×3): qty 1

## 2022-04-18 MED ORDER — RENA-VITE PO TABS
1.0000 | ORAL_TABLET | Freq: Every day | ORAL | Status: DC
  Administered 2022-04-18 – 2022-04-19 (×2): 1 via ORAL
  Filled 2022-04-18 (×2): qty 1

## 2022-04-18 MED ORDER — NEPRO/CARBSTEADY PO LIQD
237.0000 mL | Freq: Three times a day (TID) | ORAL | Status: DC
  Administered 2022-04-18 – 2022-04-19 (×3): 237 mL via ORAL

## 2022-04-18 MED ORDER — DOXERCALCIFEROL 4 MCG/2ML IV SOLN
5.0000 ug | INTRAVENOUS | Status: DC
  Filled 2022-04-18: qty 4

## 2022-04-18 MED ORDER — DOCUSATE SODIUM 100 MG PO CAPS
100.0000 mg | ORAL_CAPSULE | Freq: Two times a day (BID) | ORAL | Status: DC
  Administered 2022-04-18: 100 mg via ORAL
  Filled 2022-04-18: qty 1

## 2022-04-18 NOTE — Progress Notes (Signed)
Speech Language Pathology Treatment: Dysphagia  Patient Details Name: Devin Harmon MRN: [id number] DOB: December 05, 1955 Today's Date: 04/18/2022 Time: 1110-1130 SLP Time Calculation (min) (ACUTE ONLY): 20 min  Assessment / Plan / Recommendation Clinical Impression  Pt was seen at bedside for skilled ST intervention targeting goal for advanced diet consistencies. Pt currently tolerating puree/thin liquids per RN, however, he dislikes the current selection. Pt was awake and cooperative. No family members present. Oral care was completed with suction, which pt tolerated well. Following oral care, pt accepted trials of puree, solid, and thin liquid textures. Pt exhibited slightly extended oral prep and piecemeal deglutition of graham cracker, but demonstrated no oral residue after the swallow. Trace right anterior leakage noted after the swallow of thin liquids. No overt s/s aspiration observed on any consistency given. Recommend advancing diet to Dys2 (finely chopped) with thin liquids, meds crushed in puree. Safe swallow precautions updated at [facility]. SLP will continue to follow to assess diet tolerance and readiness to further advance. RN and MD notified.    HPI HPI: 66 yo admitted 5/15 after found unresponsive at home with AMS and respiratory failure. Intubated 5/15-5/18. PMhx: ESRD, BPH, HFrEF, HTN, cognitive decline.      SLP Plan  Continue with current plan of care      Recommendations for follow up therapy are one component of a multi-disciplinary discharge planning process, led by the attending physician.  Recommendations may be updated based on patient status, additional functional criteria and insurance authorization.    Recommendations  Diet recommendations: Dysphagia 2 (fine chop);Thin liquid Liquids provided via: Straw Medication Administration: Crushed with puree Supervision: Trained caregiver to feed patient;Full supervision/cueing for compensatory strategies Compensations: Minimize  environmental distractions;Slow rate;Small sips/bites Postural Changes and/or Swallow Maneuvers: Seated upright 90 degrees;Upright 30-60 min after meal                Oral Care Recommendations: Oral care BID;Staff/trained caregiver to provide oral care Follow Up Recommendations: Other (comment) (TBD) Assistance recommended at discharge: Frequent or constant Supervision/Assistance SLP Visit Diagnosis: Dysphagia, oral phase (R13.11) Plan: Continue with current plan of care         Dorothy Landgrebe B. Quentin Ore, [facility], [place] Speech Language Pathologist Office: [phone]  Shonna Chock 04/18/2022, 11:39 AM

## 2022-04-18 NOTE — Plan of Care (Signed)
  Problem: Education: Goal: Knowledge of General Education information will improve Description Including pain rating scale, medication(s)/side effects and non-pharmacologic comfort measures Outcome: Progressing   

## 2022-04-18 NOTE — Progress Notes (Signed)
New Admission Note:   Arrival Method:  Mental Orientation: Alert, unable to assess orientation.  Telemetry: box 1  Assessment: Completed Skin: scab on outer left leg and right chin scab  IV: Pain:  Tubes: Safety Measures: Safety Fall Prevention Plan has been given, discussed and signed Admission: Completed [facility] Orientation: Patient has been orientated to the room, unit and staff.  Family: wife   Orders have been reviewed and implemented. Will continue to monitor the patient. Call light has been placed within reach and bed alarm has been activated.   Ever Gustafson RN [place] [facility] Phone: [phone]

## 2022-04-18 NOTE — Progress Notes (Signed)
[facility] Progress Note  Subjective: seen in ICU. More awake and fidgety today. Had HD yest w/ 2.5 L off.   Vitals:   04/18/22 1200 04/18/22 1300 04/18/22 1305 04/18/22 1400  BP: (!) 147/100 (!) 139/103 (!) 142/94 (!) 143/120  Pulse: 69 68 70 62  Resp: (!) 36 (!) 40 (!) 33 (!) 25  Temp:      TempSrc:      SpO2: 94% 91% 96% 91%  Weight:      Height:        Exam: on vent ,sedated  no jvd  throat ett in place  Chest cta bilat and lat  Cor reg no RG  Abd soft ntnd no ascites   Ext no LE edema   Neuro on vent and sedated, not following commands   LIJ TDC intact      Home meds include - norvasc 10, auryxia 2 ac, percocet prn, prns/ vits/ supps    CXR 5/16 - IMPRESSION: moderate to severe cardiomegaly. Mild central vascular fullness appears similar. There are no overt edema findings. Small right pleural effusion and patchy consolidation in the right lower lung field continue to be noted.    OP HD: [place] M-F (2 hd/ week)  3.5h  400/1.5  63.5kg  P2   Heparin 3000   LIJ TDC - mircera 200 ug q2, last 5/05, due 5/19 - venofer 100mg  q hd IV, 5/05 - 5/19 - hectorol 5 ug IV tiw   Assessment/ Plan: Acute hypoxic resp failure - extubated PNA - presumed HCAP, on IV vanc/ cefepime AMS - multifactorial due to acute infection, underlying memory issues.  R effusion - per CCM ESRD - on HD MWF. Had HD here Tuesday and Wed off schedule. Next HD for today, then resume MWF.  HTN/ vol - cont home meds per tube. UF 3.5 and 2.5 w/ HD x2 here, under dry wt. BP's low normal. Will lower edw upon dc. Does not need further vol reduction for now.  Anemia esrd - Hb 10s, next esa due on 5/19 have ordered.  Holding IV Fe w/ acute infection.  BMD ckd - CCa in range, phos is high. Getting binders, resuming IV vdra DNR       Devin Harmon 04/18/2022, 2:46 PM   Recent Labs  Lab 04/14/22 0937 04/14/22 1104 04/17/22 0320 04/18/22 0230  HGB 11.2*   < > 10.3* 10.6*  ALBUMIN 3.6  --    --   --   CALCIUM 9.0   < > 8.5* 9.1  PHOS  --    < > 5.9* 6.5*  CREATININE 10.69*   < > 5.08* 6.48*  K 5.0   < > 4.6 4.9   < > = values in this interval not displayed.    Inpatient medications:  amLODipine  5 mg Oral Daily   Chlorhexidine Gluconate Cloth  6 each Topical Q0600   darbepoetin (ARANESP) injection - DIALYSIS  200 mcg Intravenous Q Fri-HD   docusate sodium  100 mg Oral BID   feeding supplement (NEPRO CARB STEADY)  237 mL Oral TID BM   ferric citrate  420 mg Oral Q breakfast   heparin  5,000 Units Subcutaneous Q8H   mouth rinse  15 mL Mouth Rinse BID   multivitamin  1 tablet Oral QHS   polyethylene glycol  17 g Oral Daily    azithromycin Stopped (04/18/22 1315)   ceFEPime (MAXIPIME) IV Stopped (04/17/22 1749)   albuterol, docusate, polyethylene glycol

## 2022-04-18 NOTE — Progress Notes (Signed)
NAME:  RALPHEAL ZAPPONE, MRN:  [id number], DOB:  Aug 13, 1956, LOS: 4 ADMISSION DATE:  04/14/2022, CONSULTATION DATE:  04/14/22 REFERRING MD:  Regenia Skeeter CHIEF COMPLAINT:  Resp failure   History of Present Illness:  AMIIR HECKARD is a 66 y.o. male who has a PMH as below including ESRD on HD MWF, last session Fri 5/12. He presented to [facility] ED 5/15 by EMS with reports of "feeling bad since Friday 5/12".  Family went and found him unresponsive on the couch in his home on morning of presentation.  On EMS arrival, he was minimally responsive and was hypoxic to the 60s; therefore, was placed on NRB for transport to ED.  In ED, he required intubation for airway protection.  CXR demonstrated multifocal PNA on R with pleural effusion.  Head CT was ordered and is pending. Per chart review he has a hx of transudative right sided effusion 2/2 CHF and ESRD and volume typically managed with HD.  Wife informed EDP that pt had been having a head cold and cough since 5/12.  He did go to dialysis that day and received treatment but did require oxygen while there which is not typical for him.  Pertinent  Medical History:  has Malignant hypertension; Essential hypertension, benign; BPH without obstruction/lower urinary tract symptoms; Routine general medical examination at a health care facility; ESRD (end stage renal disease) ([place]); Allergic rhinitis due to pollen; Cognitive decline; Right hemiparesis ([place]); Recurrent right pleural effusion; Heart failure, systolic, chronic ([place]); Vascular dementia, uncomplicated ([place]); Malnutrition of mild degree ([place]); Advance directive discussed with patient; Anemia in chronic kidney disease; Hypertensive chronic kidney disease with stage 1 through stage 4 chronic kidney disease, or unspecified chronic kidney disease; Coagulation defect, unspecified ([place]); Complication of central venous catheter; Dependence on renal dialysis ([place]); Diarrhea, unspecified; Fluid overload, unspecified;  Hyperkalemia; Hyperosmolality and hypernatremia; Hypocalcemia; Iron deficiency anemia, unspecified; Pain, unspecified; Pruritus, unspecified; Secondary hyperparathyroidism of renal origin ([place]); Shortness of breath; Acute respiratory failure with hypoxia ([place]); Respiratory failure with hypoxia ([place]); Acute encephalopathy; Acute on chronic combined systolic and diastolic congestive heart failure ([place]); Community acquired pneumonia of right lung; Protein-calorie malnutrition, severe; and Goals of care, counseling/discussion on their problem list.  Significant Hospital Events: Including procedures, antibiotic start and stop dates in addition to other pertinent events   5/15: admit; intubated for airway protection 5/16: HD 5/17: HD 5/18: on SBT; had a 1-way extubation  5/19: worse CXR  but no change in oxygenation. Due for HD   Interim History / Subjective:  1-way extubation 5/18   DNR   CXR this morning with interval incr in opacities  Objective:  Blood pressure (!) 149/98, pulse 73, temperature 97.6 F (36.4 C), temperature source Oral, resp. rate (!) 38, height 5\' 7"  (1.702 m), weight 61 kg, SpO2 94 %.    Vent Mode: PSV;CPAP FiO2 (%):  [40 %] 40 % PEEP:  [5 cmH20] 5 cmH20 Pressure Support:  [5 cmH20] 5 cmH20   Intake/Output Summary (Last 24 hours) at 04/18/2022 0708 Last data filed at 04/17/2022 2000 Gross per 24 hour  Intake 224.81 ml  Output --  Net 224.81 ml   Filed Weights   04/16/22 1550 04/17/22 0500 04/18/22 0500  Weight: 59.7 kg 56.2 kg 61 kg    Examination: General:  Ill appearing adult M appears older than stated age  27: NCAT pink mm  Neuro: Moving spontaneously. Garbled speech, interactive . Intermittently following commands  CV: rr s1s2 PULM:  CTAb even unlabored on  RA GI: soft ndnt + bowel sounds  Extremities: no acute deformity Skin: c/d/w  resolved:  Acute respiratory failure with hypoxia  Assessment & Plan:   Acute hypoxic respiratory failure,  improved Pulmonary edema due to ESRD  Presumed CAP  Right sided pleural effusion  P: -underwent 1-way extubation 5/18 -pulm hygiene, IS, mobility -volume off per HD (MWF iHD schedule)  -5d course abx -- azithro cefepime   Acute metabolic encephalopathy of unclear etiology superimposed on vascular dementia  - per family baseline answers yes and no;  -ct head 5/15: no acute infarction; ammonia and tsh WNL P: -limit sedating meds -delirium precautions   ESRd on MWF iHD P: -HD per nephro   Hx combined systolic and systolic heart failure  HTN   (Echo from July 2021 with EF 40-45%, G1DD), P: -restarting amlodipine -- starting at 5mg , adv to 10mg  if tolerated    04/18/22 stable for transfer out of ICU. Will place order for med-surg and ask TRH to take over care 5/20 PCCM off   Best practice (evaluated daily):  Diet/type: NPO DVT prophylaxis: prophylactic heparin  GI prophylaxis: PPI Lines: N/A Foley:  N/A Code Status:  DNR Last date of multidisciplinary goals of care discussion: ipal 5/15 made DNR; 5/17 spoke with wife [place] at bedside and updated; she did state that Mr. Bialas would not want to be on ventilator long term with tracheostomy. -- 1 way extubation 5/18   Eliseo Gum MSN, AGACNP-BC [place] for pager  04/18/2022, 9:56 AM

## 2022-04-18 NOTE — TOC Progression Note (Signed)
Transition of Care [facility]) - Initial/Assessment Note    Patient Details  Name: Devin Harmon MRN: [id number] Date of Birth: 09/17/1956  Transition of Care [facility] CM/SW Contact:    Milinda Antis, [place] Phone Number: 04/18/2022, 12:39 PM  Clinical Narrative:                 CSW received consult for SNF placement and attempted to met with the patient at bedside. CSW observed that the patient was in mittens, fidgeting, and confused.  There was no family in the room with the patient at this time.  CSW unable to complete assessment at this time.  TOC will continue to follow.        Barriers to Discharge: Continued Medical Work up   Patient Goals and CMS Choice Patient states their goals for this hospitalization and ongoing recovery are:: To get better and return home CMS Medicare.gov Compare Post Acute Care list provided to:: Patient Choice offered to / list presented to : Spouse, Patient  Expected Discharge Plan and Services     Discharge Planning Services: CM Consult   Living arrangements for the past 2 months: Single Family Home                                      Prior Living Arrangements/Services Living arrangements for the past 2 months: Single Family Home Lives with:: Spouse Patient language and need for interpreter reviewed:: Yes Do you feel safe going back to the place where you live?: Yes      Need for Family Participation in Patient Care: Yes (Comment) Care giver support system in place?: Yes (comment) Current home services: DME ([facility], grab bar by toilet.) Criminal Activity/Legal Involvement Pertinent to Current Situation/Hospitalization: No - Comment as needed  Activities of Daily Living      Permission Sought/Granted Permission sought to share information with : Case Manager, Customer service manager, Family Supports Permission granted to share information with : Yes, Verbal Permission Granted              Emotional  Assessment Appearance:: Appears stated age Attitude/Demeanor/Rapport: Engaged, Gracious Affect (typically observed): Accepting, Other (comment) (Confused.) Orientation: : Oriented to Self Alcohol / Substance Use: Not Applicable Psych Involvement: No (comment)  Admission diagnosis:  Respiratory failure with hypoxia (HCC) [J96.91] Acute respiratory failure with hypoxia ([place]) [J96.01] Community acquired pneumonia of right lung, unspecified part of lung [J18.9] Patient Active Problem List   Diagnosis Date Noted   Goals of care, counseling/discussion    Protein-calorie malnutrition, severe 04/16/2022   Community acquired pneumonia of right lung    Respiratory failure with hypoxia ([place]) 04/14/2022   Acute encephalopathy    Acute on chronic combined systolic and diastolic congestive heart failure ([place])    Hyperosmolality and hypernatremia 09/20/2021   Malnutrition of mild degree ([place]) 08/20/2021   Advance directive discussed with patient 08/20/2021   Vascular dementia, uncomplicated ([place]) 00/17/4944   Heart failure, systolic, chronic ([place]) 96/75/9163   Fluid overload, unspecified 07/04/2020   Acute respiratory failure with hypoxia ([place]) 07/04/2020   Recurrent right pleural effusion 06/26/2020   Hyperkalemia 05/23/2020   Hypocalcemia 04/13/2020   Right hemiparesis ([place]) 02/07/2020   Dependence on renal dialysis ([place]) 01/04/2020   Anemia in chronic kidney disease 12/29/2019   Hypertensive chronic kidney disease with stage 1 through stage 4 chronic kidney disease, or unspecified chronic kidney disease 12/29/2019   Coagulation defect,  unspecified ([place]) 12/29/2019   Diarrhea, unspecified 12/29/2019   Iron deficiency anemia, unspecified 12/29/2019   Pain, unspecified 12/29/2019   Pruritus, unspecified 12/29/2019   Secondary hyperparathyroidism of renal origin ([place]) 12/29/2019   Shortness of breath 12/29/2019   Cognitive decline 94/10/9046   Complication of central venous catheter  05/23/2019   Allergic rhinitis due to pollen 02/10/2019   ESRD (end stage renal disease) ([place]) 08/28/2018   Routine general medical examination at a health care facility 06/20/2011   Malignant hypertension 02/28/2010   BPH without obstruction/lower urinary tract symptoms 01/04/2008   Essential hypertension, benign 08/19/2007   PCP:  Venia Carbon, MD Pharmacy:   [id number] [place], [id number]: [phone]: [phone]    Social Determinants of Health (SDOH) Interventions    Readmission Risk Interventions     View : No data to display.

## 2022-04-19 ENCOUNTER — Encounter (HOSPITAL_COMMUNITY): Payer: Self-pay | Admitting: Critical Care Medicine

## 2022-04-19 ENCOUNTER — Inpatient Hospital Stay (HOSPITAL_COMMUNITY): Payer: Medicare Other

## 2022-04-19 DIAGNOSIS — L8990 Pressure ulcer of unspecified site, unspecified stage: Secondary | ICD-10-CM | POA: Insufficient documentation

## 2022-04-19 LAB — GLUCOSE, CAPILLARY
Glucose-Capillary: 104 mg/dL — ABNORMAL HIGH (ref 70–99)
Glucose-Capillary: 70 mg/dL (ref 70–99)
Glucose-Capillary: 77 mg/dL (ref 70–99)
Glucose-Capillary: 81 mg/dL (ref 70–99)
Glucose-Capillary: 87 mg/dL (ref 70–99)
Glucose-Capillary: 88 mg/dL (ref 70–99)

## 2022-04-19 LAB — BLOOD GAS, ARTERIAL
Acid-Base Excess: 1.4 mmol/L (ref 0.0–2.0)
Bicarbonate: 24.9 mmol/L (ref 20.0–28.0)
Drawn by: 65022
O2 Saturation: 92.5 %
Patient temperature: 36.5
pCO2 arterial: 34 mmHg (ref 32–48)
pH, Arterial: 7.47 — ABNORMAL HIGH (ref 7.35–7.45)
pO2, Arterial: 62 mmHg — ABNORMAL LOW (ref 83–108)

## 2022-04-19 LAB — CULTURE, BLOOD (ROUTINE X 2)
Culture: NO GROWTH
Culture: NO GROWTH
Special Requests: ADEQUATE

## 2022-04-19 MED ORDER — HALOPERIDOL LACTATE 5 MG/ML IJ SOLN
5.0000 mg | Freq: Four times a day (QID) | INTRAMUSCULAR | Status: DC | PRN
  Administered 2022-04-19 – 2022-04-20 (×2): 5 mg via INTRAVENOUS
  Filled 2022-04-19 (×2): qty 1

## 2022-04-19 MED ORDER — HEPARIN SODIUM (PORCINE) 1000 UNIT/ML DIALYSIS: 1500.0000 [IU] | INTRAMUSCULAR | Status: DC | PRN

## 2022-04-19 MED ORDER — SODIUM CHLORIDE 0.9 % IV SOLN
INTRAVENOUS | Status: DC | PRN
Start: 2022-04-19 — End: 2022-04-20

## 2022-04-19 MED ORDER — QUETIAPINE FUMARATE 50 MG PO TABS
25.0000 mg | ORAL_TABLET | Freq: Every day | ORAL | Status: DC
  Administered 2022-04-19: 25 mg via ORAL
  Filled 2022-04-19: qty 1

## 2022-04-19 MED ORDER — GUAIFENESIN ER 600 MG PO TB12
1200.0000 mg | ORAL_TABLET | Freq: Two times a day (BID) | ORAL | Status: DC
Start: 2022-04-19 — End: 2022-04-20
  Administered 2022-04-19 (×2): 1200 mg via ORAL
  Filled 2022-04-19 (×3): qty 2

## 2022-04-19 NOTE — Progress Notes (Signed)
RRT at bedside to place pt on bipap, per PA Andres Labrum bipap was ordered to be placed on arrival to [facility] to assist with WOB. At this time, WOB seems to be WNL at this time. Pt isn't in any distress. NT helped RT scoot him up in the bed and sit him up and he is awake and looking at [place]. Wife at bedside and agrees to hold off on bipap right now. She thinks pt was agitated and is doing better with her at bedside. RRT will continue to monitor for any distress.

## 2022-04-19 NOTE — Progress Notes (Signed)
eLink Physician-Brief Progress Note Patient Name: Devin Harmon DOB: [birth year] MRN: [id number]   Date of Service  04/19/2022  HPI/Events of Note  Nursing reports abdominal breathing with RR in 30's. Just returned from HD. Albuteral Neb given. Patient is DNR. No video camera in room. Nursing requests that PCCM ground team evaluate the patient.  eICU Interventions  Plan: Portable CXR STAT. Trial of BiPAP. Will request PCCM ground team evaluate the patient at bedside.     Intervention Category Major Interventions: Hypoxemia - evaluation and management  Aaria Happ Cornelia Copa 04/19/2022, 5:32 AM

## 2022-04-19 NOTE — Progress Notes (Signed)
   04/19/22 0458  Assess: MEWS Score  Temp 97.7 F (36.5 C)  BP (!) 126/102  Pulse Rate 73  Resp (!) 34  Level of Consciousness Alert  SpO2 96 %  O2 Device Nasal Cannula  O2 Flow Rate (L/min) 2 L/min  Assess: MEWS Score  MEWS Temp 0  MEWS Systolic 0  MEWS Pulse 0  MEWS RR 2  MEWS LOC 0  MEWS Score 2  MEWS Score Color Yellow  Assess: if the MEWS score is Yellow or Red  Were vital signs taken at a resting state? Yes  Focused Assessment Change from prior assessment (see assessment flowsheet)  Early Detection of Sepsis Score *See Row Information* Low  MEWS guidelines implemented *See Row Information* Yes  Take Vital Signs  Increase Vital Sign Frequency  Yellow: Q 2hr X 2 then Q 4hr X 2, if remains yellow, continue Q 4hrs  Escalate  MEWS: Escalate Yellow: discuss with charge nurse/RN and consider discussing with provider and RRT  Notify: Charge Nurse/RN  Name of Charge Nurse/RN Notified Cheryl,RN  Date Charge Nurse/RN Notified 04/19/22  Time Charge Nurse/RN Notified 0600  Notify: Provider  Provider Name/Title Sommer,MD  Date Provider Notified 04/19/22  Time Provider Notified 0518  Method of Notification Call  Notification Reason Change in status  Provider response En route  Date of Provider Response 04/19/22  Time of Provider Response 0520  Notify: Rapid Response  Name of Rapid Response RN Notified Mindy,RN  Date Rapid Response Notified 04/19/22  Time Rapid Response Notified 0600

## 2022-04-19 NOTE — Progress Notes (Signed)
PROGRESS NOTE  Devin Harmon  DOB: 1956-02-19  PCP: Venia Carbon, MD SFK:[id number]  DOA: 04/14/2022  LOS: 5 days  Hospital Day: 6  Brief narrative: Devin Harmon is a 66 y.o. male with PMH significant for ESRD-HD-MWF, HTN, systolic CHF, chronic anemia, BPH. Patient started feeling bad since his dialysis on 5/12.  On 5/15, family went to find him unresponsive on the couch.  EMS found him minimally responsive, hypoxic to 60s, placed on NRB and brought to the ED  In the ED, he required intubation for airway protection. CXR demonstrated multifocal PNA on R with pleural effusion.   Head CT did not show any acute intracranial abnormality  Admitted to ICU Dialysis was resumed. 5/18, extubated, family made DNR and DNI 519, transferred out to [facility]   Subjective: Patient was seen and examined this morning.  Elderly African-American male.  Events from this morning noted. Early this morning, patient was noted to have increased work of breathing with respiratory rate in 30s but O2 sat was 96% on 2 L.  He was planned for trial of BiPAP but he gradually calmed down and did not need BiPAP. At the time of my evaluation, patient was propped up in bed.  Not in distress.  On 2 L oxygen by nasal cannula.  Family at bedside.  Patient had mild wet cough.  He seems confused and slightly restless.  Principal Problem:   Respiratory failure with hypoxia ([place]) Active Problems:   ESRD (end stage renal disease) (HCC)   Recurrent right pleural effusion   Community acquired pneumonia of right lung   Protein-calorie malnutrition, severe   Goals of care, counseling/discussion   Pressure injury of skin    Assessment and Plan: Acute respiratory failure with hypoxia -Due to the combination of volume overload and pneumonia -Initial chest x-ray read as multifocal pneumonia which could also be asymmetric infiltrates due to pulm edema.  -To complete 5-day course of subcu antibiotics -Required intubation in  the ED.  Underwent one-way extubation on 5/18 -Currently on oxygen by nasal cannula.  Check ambulatory oxygen requirement. -Encourage mobility, pulm hygiene, incentive spirometry -Add Mucinex.  Volume overload status ESRD-HD-MWF -Chest x-ray on admission showed pulm edema -Volume status improving with dialysis.  Acute metabolic encephalopathy  Vascular dementia  -CT head 5/15: no acute infarction; ammonia and tsh WNL -Delirium precautions.  Continue to monitor mental status -I discussed with family and started patient on Seroquel 25 mg for tonight.   Hx combined systolic and systolic heart failure  Essential hypertension  -Echo from July 2021 with EF 40-45%, G1DD, -Continue amlodipine  Goals of care   Code Status: DNR    Mobility: Pending PT eval  Skin assessment:  Pressure Injury 04/19/22 Back Right;Lower Stage 2 -  Partial thickness loss of dermis presenting as a shallow open injury with a red, pink wound bed without slough. (Active)  04/19/22 0700  Location: Back  Location Orientation: Right;Lower  Staging: Stage 2 -  Partial thickness loss of dermis presenting as a shallow open injury with a red, pink wound bed without slough.  Wound Description (Comments):   Present on Admission:     Nutritional status:  Body mass index is 20.03 kg/m.  Nutrition Problem: Severe Malnutrition Etiology: chronic illness (ESRD, CHF, vascular dementia) Signs/Symptoms: moderate fat depletion, severe muscle depletion, percent weight loss (7.2% weight loss in 2 months) Percent weight loss: 7.2 %     Diet:  Diet Order  DIET DYS 2 Room service appropriate? Yes; Fluid consistency: Thin  Diet effective now                   DVT prophylaxis:  heparin injection 5,000 Units Start: 04/14/22 1400 SCDs Start: 04/14/22 1119   Antimicrobials: Completed the course Fluid: None Consultants: PCCM Family Communication: Wife at bedside  Status is: Inpatient  Continue  in-hospital care because: Improving Level of care: Progressive   Dispo: The patient is from: Home              Anticipated d/c is to: Family wants to take him home and not to a facility.  Hopefully home with therapy tomorrow              Patient currently is not medically stable to d/c.   Difficult to place patient No     Infusions:   sodium chloride 10 mL/hr at 04/19/22 0354    Scheduled Meds:  amLODipine  5 mg Oral Daily   Chlorhexidine Gluconate Cloth  6 each Topical Q0600   darbepoetin (ARANESP) injection - DIALYSIS  200 mcg Intravenous Q Fri-HD   docusate sodium  100 mg Oral BID   doxercalciferol  5 mcg Intravenous Once per day on Mon Fri   feeding supplement (NEPRO CARB STEADY)  237 mL Oral TID BM   ferric citrate  420 mg Oral Q breakfast   guaiFENesin  1,200 mg Oral BID   heparin  5,000 Units Subcutaneous Q8H   mouth rinse  15 mL Mouth Rinse BID   multivitamin  1 tablet Oral QHS   polyethylene glycol  17 g Oral Daily   QUEtiapine  25 mg Oral QHS    PRN meds: sodium chloride, albuterol, docusate, [START ON 04/20/2022] heparin, polyethylene glycol   Antimicrobials: Anti-infectives (From admission, onward)    Start     Dose/Rate Route Frequency Ordered Stop   04/16/22 2000  vancomycin (VANCOREADY) IVPB 500 mg/100 mL  Status:  Discontinued        500 mg 100 mL/hr over 60 Minutes Intravenous Every M-W-F (Hemodialysis) 04/14/22 1440 04/14/22 1924   04/14/22 1445  ceFEPIme (MAXIPIME) 1 g in sodium chloride 0.9 % 100 mL IVPB        1 g 200 mL/hr over 30 Minutes Intravenous Daily-1800 04/14/22 1351 04/19/22 0440   04/14/22 1430  azithromycin (ZITHROMAX) 250 mg in dextrose 5 % 125 mL IVPB        250 mg 127.5 mL/hr over 60 Minutes Intravenous Every 24 hours 04/14/22 1337 04/18/22 2359   04/14/22 1300  ceFEPIme (MAXIPIME) 1 g in sodium chloride 0.9 % 100 mL IVPB  Status:  Discontinued        1 g 200 mL/hr over 30 Minutes Intravenous Every 24 hours 04/14/22 1209 04/14/22  1351   04/14/22 1218  vancomycin variable dose per unstable renal function (pharmacist dosing)  Status:  Discontinued         Does not apply See admin instructions 04/14/22 1218 04/14/22 1440   04/14/22 1215  vancomycin (VANCOREADY) IVPB 1500 mg/300 mL        1,500 mg 150 mL/hr over 120 Minutes Intravenous  Once 04/14/22 1209 04/14/22 1635   04/14/22 1000  cefTRIAXone (ROCEPHIN) 2 g in sodium chloride 0.9 % 100 mL IVPB  Status:  Discontinued        2 g 200 mL/hr over 30 Minutes Intravenous Every 24 hours 04/14/22 0948 04/14/22 1205   04/14/22 1000  azithromycin (ZITHROMAX)  500 mg in sodium chloride 0.9 % 250 mL IVPB  Status:  Discontinued        500 mg 250 mL/hr over 60 Minutes Intravenous Every 24 hours 04/14/22 0948 04/14/22 1205       Objective: Vitals:   04/19/22 0719 04/19/22 0752  BP: (!) 157/90 (!) 141/89  Pulse: 73 72  Resp: (!) 32 (!) 27  Temp: 98.2 F (36.8 C) 98.4 F (36.9 C)  SpO2: 99% 95%    Intake/Output Summary (Last 24 hours) at 04/19/2022 1125 Last data filed at 04/19/2022 0220 Gross per 24 hour  Intake 223.48 ml  Output 1164 ml  Net -940.52 ml   Filed Weights   04/18/22 0500 04/18/22 2220 04/19/22 0220  Weight: 61 kg 59.3 kg 58 kg   Weight change: -1.7 kg Body mass index is 20.03 kg/m.   Physical Exam: General exam: Pleasant, elderly African-American male.  Not in physical distress Skin: No rashes, lesions or ulcers. HEENT: Atraumatic, normocephalic, no obvious bleeding Lungs: Mild bilateral crackles in the bases, coughs on deep breathing CVS: Regular rate and rhythm, no murmur GI/Abd soft, nontender, nondistended, bowel sound present CNS: Alert, awake, minimal conversation Psychiatry: Mood appropriate Extremities: No pedal edema, no calf tenderness  Data Review: I have personally reviewed the laboratory data and studies available.  F/u labs ordered Unresulted Labs (From admission, onward)     Start     Ordered   04/20/22 0500  CBC with  Differential/Platelet  Tomorrow morning,   R       Question:  Specimen collection method  Answer:  Lab=Lab collect   04/19/22 1125   04/20/22 0263  Basic metabolic panel  Tomorrow morning,   R       Question:  Specimen collection method  Answer:  Lab=Lab collect   04/19/22 1125   04/14/22 1121  Culture, Respiratory w Gram Stain  Once,   R        04/14/22 1120   04/14/22 0948  Urine Culture  (Septic presentation on arrival (screening labs, nursing and treatment orders for obvious sepsis))  ONCE - URGENT,   URGENT       Question:  Indication  Answer:  Sepsis   04/14/22 0948   04/14/22 0940  Urinalysis, Routine w reflex microscopic  Once,   URGENT        04/14/22 7858            Signed, Terrilee Croak, MD [facility] 04/19/2022

## 2022-04-19 NOTE — Progress Notes (Signed)
PCCM ground team called to bedside to evaluate for increase WOB. Patient just received dialysis. RR in 30's using abdominal muscles. Some purse lip breathing. Sats 96% on 2L [place].   Plan: -check CXR -check ABG -npo except w/ meds -start on bipap -transfer to progressive unit -IPAL from 5/18 stating wife wants one-way extubation and DNR. If patient respiratory status worsens does not want reintubation and she would prefer to go comfort care and hopefully transfer patient home for at home hospice. Spoke with wife today 5/20 and updated over phone. She is agreeable to a short term trial of bipap. If he does not improve she may consider comfort.  JD Rexene Agent [employer] [facility] 04/19/2022, 6:09 AM  Please see [email or web address] for pager details.  From 7A-7P if no response, please call [phone]. After hours, please call ELink [phone].

## 2022-04-19 NOTE — Progress Notes (Signed)
[facility] Progress Note  Subjective: going to SDU for bipap due to resp issues  Vitals:   04/19/22 0458 04/19/22 0719 04/19/22 0752 04/19/22 1200  BP: (!) 126/102 (!) 157/90 (!) 141/89 (!) 154/79  Pulse: 73 73 72 69  Resp: (!) 34 (!) 32 (!) 27 (!) 22  Temp: 97.7 F (36.5 C) 98.2 F (36.8 C) 98.4 F (36.9 C) 97.9 F (36.6 C)  TempSrc: Oral   Oral  SpO2: 96% 99% 95% 100%  Weight:      Height:        Exam: on vent ,sedated  no jvd  throat ett in place  Chest cta bilat and lat  Cor reg no RG  Abd soft ntnd no ascites   Ext no LE edema   Neuro on vent and sedated, not following commands   [facility] TDC intact      Home meds include - norvasc 10, auryxia 2 ac, percocet prn, prns/ vits/ supps    CXR 5/16 - IMPRESSION: moderate to severe cardiomegaly. Mild central vascular fullness appears similar. There are no overt edema findings. Small right pleural effusion and patchy consolidation in the right lower lung field continue to be noted.    OP HD: [place] M-F (2 hd/ week)  3.5h  400/1.5  63.5kg  P2   Heparin 3000   [facility] TDC - mircera 200 ug q2, last 5/05, due 5/19 - venofer 100mg  q hd IV, 5/05 - 5/19 - hectorol 5 ug IV tiw   Assessment/ Plan: Acute hypoxic resp failure - extubated but is now declining again. Per notes family does not want intubation but short trial of bipap is okay. CCM following.  PNA - presumed HCAP, on IV vanc/ cefepime AMS - multifactorial due to acute infection, underlying memory issues.  R effusion - per CCM ESRD - on HD MWF. Had HD here Tu-Wed-Fri this week. Next HD Monday.  HTN/ vol - cont home meds per tube. Under dry wt by 5kg here. No edema on exam. 1 L UF yesterday.  Anemia esrd - Hb 10s, next esa due on 5/19 have ordered.  Holding IV Fe w/ acute infection.  BMD ckd - CCa in range, phos is high. Getting binders, resuming IV vdra DNR       Kelly Splinter 04/19/2022, 1:58 PM   Recent Labs  Lab 04/14/22 0937 04/14/22 1104  04/17/22 0320 04/18/22 0230  HGB 11.2*   < > 10.3* 10.6*  ALBUMIN 3.6  --   --   --   CALCIUM 9.0   < > 8.5* 9.1  PHOS  --    < > 5.9* 6.5*  CREATININE 10.69*   < > 5.08* 6.48*  K 5.0   < > 4.6 4.9   < > = values in this interval not displayed.    Inpatient medications:  amLODipine  5 mg Oral Daily   Chlorhexidine Gluconate Cloth  6 each Topical Q0600   darbepoetin (ARANESP) injection - DIALYSIS  200 mcg Intravenous Q Fri-HD   docusate sodium  100 mg Oral BID   doxercalciferol  5 mcg Intravenous Once per day on Mon Fri   feeding supplement (NEPRO CARB STEADY)  237 mL Oral TID BM   ferric citrate  420 mg Oral Q breakfast   guaiFENesin  1,200 mg Oral BID   heparin  5,000 Units Subcutaneous Q8H   mouth rinse  15 mL Mouth Rinse BID   multivitamin  1 tablet Oral QHS   polyethylene  glycol  17 g Oral Daily   QUEtiapine  25 mg Oral QHS    sodium chloride 10 mL/hr at 04/19/22 0354   sodium chloride, albuterol, docusate, haloperidol lactate, [START ON 04/20/2022] heparin, polyethylene glycol

## 2022-04-19 NOTE — Progress Notes (Signed)
Patient to be transferred to [facility] room 16. Report given to Tracy,RN. Wife,Georgia updated and made aware that patient will be transferred to [facility].

## 2022-04-19 NOTE — Evaluation (Signed)
Occupational Therapy Evaluation Patient Details Name: Devin Harmon MRN: [id number] DOB: [birth year] Today's Date: 04/19/2022   History of Present Illness 66 yo admitted 5/15 after found unresponsive at home with AMS and respiratory failure. Intubated 5/15-5/18. PMhx: ESRD, BPH, HFrEF, HTN, cognitive decline   Clinical Impression   Pt presents with decline in function and safety with ADLs and ADL mobility with impaired strength, balance and endurance. PTA, pt lived at home with his wife (she is his 24 hr caregiver), goes to HD 2 days/wk (per p't wife).  Pt's wife reports that she provided  total A at baseline for bathing, LB dressing and toileting, pt was using RW or furniture surfing for mobility and getting OOB himself, feeding himself and donning shirt with set up. Family adamant about pt not needing therapy, not even HH, saying that once he gets home he will bounce back like he has in the past. Pt's wife asked many questions about HH therapies and asked could pt just have it 1x/wk, which OT explained that most likely would not be effective. Despite pt's wife and son being present to see that sitting EOB was max A and sit - stand/SPT required max A +2, family continuously stating that they think he will "bounce back" once he get home a she has done in the past. Pt would benefit from acute OT services to address impairments to maximize level of function and safety     Recommendations for follow up therapy are one component of a multi-disciplinary discharge planning process, led by the attending physician.  Recommendations may be updated based on patient status, additional functional criteria and insurance authorization.   Follow Up Recommendations  Skilled nursing-short term rehab (<3 hours/day) (pt adamantly refusing SNF and [facility] stating that they don't think he needs it)    Assistance Recommended at Discharge Frequent or constant Supervision/Assistance  Patient can return home with the following  A lot of help with bathing/dressing/bathroom;Two people to help with walking and/or transfers;Assist for transportation    Functional Status Assessment  Patient has had a recent decline in their functional status and demonstrates the ability to make significant improvements in function in a reasonable and predictable amount of time.  Equipment Recommendations  None recommended by OT    Recommendations for Other Services       Precautions / Restrictions Precautions Precautions: Fall Restrictions Weight Bearing Restrictions: No      Mobility Bed Mobility Overal bed mobility: Needs Assistance Bed Mobility: Supine to Sit, Sit to Supine     Supine to sit: Total assist, Max assist     General bed mobility comments: total A to bring LEs to and off EOB, max A to elevate trunk and to scoot hips to EOB    Transfers Overall transfer level: Needs assistance   Transfers: Sit to/from Stand Sit to Stand: Max assist, +2 physical assistance, +2 safety/equipment           General transfer comment: max A +2 x 3 trials; SPT to recliner with assist from RN. Pt's family present      Balance Overall balance assessment: Needs assistance Sitting-balance support: Feet unsupported, Feet supported, No upper extremity supported Sitting balance-Leahy Scale: Poor Sitting balance - Comments: min assist for sitting balance with posterior and R lean Postural control: Posterior lean, Right lateral lean Standing balance support: Bilateral upper extremity supported Standing balance-Leahy Scale: Poor  ADL either performed or assessed with clinical judgement   ADL Overall ADL's : Needs assistance/impaired Eating/Feeding: Set up   Grooming: Wash/dry hands;Wash/dry face;Minimal assistance Grooming Details (indicate cue type and reason): seated EOB         Upper Body Dressing : Moderate assistance;Sitting       Toilet Transfer: Maximal assistance;+2  for physical assistance;Cueing for safety;Cueing for sequencing;Stand-pivot Toilet Transfer Details (indicate cue type and reason): simulated to recliner, multimodal cyues required Toileting- Clothing Manipulation and Hygiene: Total assistance       Functional mobility during ADLs: Maximal assistance;+2 for physical assistance;+2 for safety/equipment;Cueing for sequencing;Cueing for safety General ADL Comments: Pt's wife reports that she provided  total A at baseline for bathing, LB dressing and toileting. Pt was getting OOB himself, feeding himself and donning shirt with set up. Family adamant about pt not needing therapy, even HH, saying that once he gets home he will bounce back like he has in the past     Vision Patient Visual Report: No change from baseline       Perception     Praxis      Pertinent Vitals/Pain Pain Assessment Pain Assessment: Faces Faces Pain Scale: No hurt Pain Intervention(s): Monitored during session, Repositioned     Hand Dominance Right   Extremity/Trunk Assessment Upper Extremity Assessment Upper Extremity Assessment: Generalized weakness   Lower Extremity Assessment Lower Extremity Assessment: Defer to PT evaluation   Cervical / Trunk Assessment Cervical / Trunk Assessment: Kyphotic   Communication Communication Communication: Expressive difficulties   Cognition Arousal/Alertness: Awake/alert Behavior During Therapy: Flat affect Overall Cognitive Status: Impaired/Different from baseline Area of Impairment: Orientation, Attention, Following commands                       Following Commands: Follows one step commands inconsistently       General Comments: slow processing     General Comments       Exercises     Shoulder Instructions      Home Living Family/patient expects to be discharged to:: Private residence Living Arrangements: Spouse/significant other Available Help at Discharge: Family;Available 24  hours/day Type of Home: House Home Access: Stairs to enter [employer] of Steps: 5 Entrance Stairs-Rails: Left;Right       Bathroom Shower/Tub: Teacher, early years/pre: Handicapped height     Home Equipment: Conservation officer, nature (2 wheels);BSC/3in1;Shower seat   Additional Comments: pt's wife states that pt uses RW if he feels like he needs to, otherwise he furniture surfs      Prior Functioning/Environment Prior Level of Function : Needs assist  Cognitive Assist : ADLs (cognitive)   ADLs (Cognitive): Intermittent cues Physical Assist : Mobility (physical);ADLs (physical) Mobility (physical): Bed mobility;Transfers ADLs (physical): Bathing;Dressing;Toileting Mobility Comments: uses RW sometimes          OT Problem List: Decreased strength;Impaired balance (sitting and/or standing);Decreased cognition;Decreased range of motion;Decreased safety awareness;Decreased activity tolerance;Decreased coordination      OT Treatment/Interventions: Self-care/ADL training;Patient/family education;Therapeutic exercise;Balance training;Neuromuscular education;Therapeutic activities;DME and/or AE instruction    OT Goals(Current goals can be found in the care plan section) Acute Rehab OT Goals Patient Stated Goal: Pt's wife wants to take him home OT Goal Formulation: With patient/family Time For Goal Achievement: 05/03/22 Potential to Achieve Goals: Good ADL Goals Pt Will Perform Eating: with set-up;with supervision;sitting Pt Will Perform Upper Body Dressing: with min assist;with min guard assist;sitting Pt Will Transfer to Toilet: with mod assist;stand pivot transfer;bedside commode  OT Frequency: Min 2X/week    Co-evaluation              AM-PAC OT "6 Clicks" Daily Activity     Outcome Measure Help from another person eating meals?: A Little Help from another person taking care of personal grooming?: A Little Help from another person toileting, which includes  using toliet, bedpan, or urinal?: Total Help from another person bathing (including washing, rinsing, drying)?: Total Help from another person to put on and taking off regular upper body clothing?: A Lot Help from another person to put on and taking off regular lower body clothing?: Total 6 Click Score: 11   End of Session Equipment Utilized During Treatment: Gait belt Nurse Communication: Mobility status  Activity Tolerance: Patient tolerated treatment well Patient left: in chair;with call bell/phone within reach;with family/visitor present  OT Visit Diagnosis: Unsteadiness on feet (R26.81);Other abnormalities of gait and mobility (R26.89);Muscle weakness (generalized) (M62.81);Other symptoms and signs involving cognitive function;Cognitive communication deficit (R41.841)                Time: 4327-6147 OT Time Calculation (min): 45 min Charges:  OT General Charges $OT Visit: 1 Visit OT Evaluation $OT Eval Moderate Complexity: 1 Mod OT Treatments $Self Care/Home Management : 8-22 mins $Therapeutic Activity: 8-22 mins    Britt Bottom 04/19/2022, 12:52 PM

## 2022-04-19 NOTE — Progress Notes (Signed)
Transferred patient to 2C16. Wife at bedside.

## 2022-04-20 LAB — CBC WITH DIFFERENTIAL/PLATELET
Abs Immature Granulocytes: 0.06 10*3/uL (ref 0.00–0.07)
Basophils Absolute: 0.1 10*3/uL (ref 0.0–0.1)
Basophils Relative: 1 %
Eosinophils Absolute: 0 10*3/uL (ref 0.0–0.5)
Eosinophils Relative: 1 %
HCT: 37.2 % — ABNORMAL LOW (ref 39.0–52.0)
Hemoglobin: 11.5 g/dL — ABNORMAL LOW (ref 13.0–17.0)
Immature Granulocytes: 1 %
Lymphocytes Relative: 8 %
Lymphs Abs: 0.6 10*3/uL — ABNORMAL LOW (ref 0.7–4.0)
MCH: 28.5 pg (ref 26.0–34.0)
MCHC: 30.9 g/dL (ref 30.0–36.0)
MCV: 92.3 fL (ref 80.0–100.0)
Monocytes Absolute: 0.6 10*3/uL (ref 0.1–1.0)
Monocytes Relative: 9 %
Neutro Abs: 6 10*3/uL (ref 1.7–7.7)
Neutrophils Relative %: 80 %
Platelets: 226 10*3/uL (ref 150–400)
RBC: 4.03 MIL/uL — ABNORMAL LOW (ref 4.22–5.81)
RDW: 19.2 % — ABNORMAL HIGH (ref 11.5–15.5)
WBC: 7.3 10*3/uL (ref 4.0–10.5)
nRBC: 0 % (ref 0.0–0.2)

## 2022-04-20 LAB — GLUCOSE, CAPILLARY
Glucose-Capillary: 83 mg/dL (ref 70–99)
Glucose-Capillary: 78 mg/dL (ref 70–99)
Glucose-Capillary: 71 mg/dL (ref 70–99)

## 2022-04-20 LAB — BLOOD GAS, ARTERIAL
Acid-base deficit: 0.8 mmol/L (ref 0.0–2.0)
Bicarbonate: 24.3 mmol/L (ref 20.0–28.0)
Drawn by: 53527
O2 Saturation: 99.4 %
Patient temperature: 36.9
pCO2 arterial: 41 mmHg (ref 32–48)
pH, Arterial: 7.38 (ref 7.35–7.45)
pO2, Arterial: 118 mmHg — ABNORMAL HIGH (ref 83–108)

## 2022-04-20 LAB — BASIC METABOLIC PANEL
Anion gap: 13 (ref 5–15)
BUN: 37 mg/dL — ABNORMAL HIGH (ref 8–23)
CO2: 24 mmol/L (ref 22–32)
Calcium: 8.9 mg/dL (ref 8.9–10.3)
Chloride: 101 mmol/L (ref 98–111)
Creatinine, Ser: 5.53 mg/dL — ABNORMAL HIGH (ref 0.61–1.24)
GFR, Estimated: 11 mL/min — ABNORMAL LOW (ref >60)
Glucose, Bld: 84 mg/dL (ref 70–99)
Potassium: 4.3 mmol/L (ref 3.5–5.1)
Sodium: 138 mmol/L (ref 135–145)

## 2022-04-20 LAB — CULTURE, RESPIRATORY W GRAM STAIN: Gram Stain: NONE SEEN

## 2022-04-20 MED ORDER — GUAIFENESIN ER 600 MG PO TB12: 1200.0000 mg | ORAL_TABLET | Freq: Two times a day (BID) | ORAL | 0 refills | Status: AC

## 2022-04-20 MED ORDER — QUETIAPINE FUMARATE 25 MG PO TABS: 25.0000 mg | ORAL_TABLET | Freq: Every day | ORAL | 0 refills | Status: AC

## 2022-04-20 MED ORDER — DOCUSATE SODIUM 100 MG PO CAPS
100.0000 mg | ORAL_CAPSULE | Freq: Two times a day (BID) | ORAL | 0 refills | Status: AC
Start: 2022-04-20 — End: 2022-05-20

## 2022-04-20 MED ORDER — DEXTROSE 5 % IV SOLN: INTRAVENOUS | Status: DC

## 2022-04-20 MED ORDER — DEXTROSE-NACL 5-0.9 % IV SOLN: INTRAVENOUS | Status: DC

## 2022-04-20 MED ORDER — NEPRO/CARBSTEADY PO LIQD: 237.0000 mL | Freq: Three times a day (TID) | ORAL | 0 refills | Status: AC

## 2022-04-20 NOTE — TOC Transition Note (Signed)
Transition of Care [facility]) - CM/SW Discharge Note   Patient Details  Name: Devin Harmon MRN: [id number] Date of Birth: Oct 09, 1956  [facility] CM/SW Contact:  Gayla Medicus., RN Phone Number: 04/20/2022, 2:48 PM  Clinical Narrative:    Mr. Quevedo is a 66 y/o gentleman with a history of ESRD who presented with confusion and acute respiratory failure this morning. He normally is not confused and does not take opiate meds or other medications to explain the change in his mental status. He has been compliant with HD-- most recently on Friday. He had to be started on oxygen before his sessions due to hypoxia, but his wife reports they got him below his goal weight and he came off at the end of the treatment. He had a cough with difficult to clear secretions this weekend.  RNCM received HH and DME orders.  RNCM spoke to patient's wife.  Patient's wife declines any and all HH services at this time-states she is "overwhelmed" and wants to see "how he adapts" once he is at home.  Patient's wife aware she will need to contact patient's PCP for any HH orders once discharged.  Patient does not qualify for Oxygen based on sats  of 93% on room air. Patient's RN and Provider aware.   Final next level of care: Home/Self Care Barriers to Discharge: Continued Medical Work up  Patient Goals and CMS Choice Patient states their goals for this hospitalization and ongoing recovery are:: To get better and return home CMS Medicare.gov Compare Post Acute Care list provided to:: Patient Choice offered to / list presented to : Spouse, Patient  Discharge Placement                      Discharge Plan and Services In-house Referral: Clinical Social Work Discharge Planning Services: CM Consult Post Acute Care Choice: Durable Medical Equipment          DME Arranged: Oxygen DME Agency: [employer] Date DME Agency Contacted: 04/20/22 Time DME Agency Contacted: [phone] Representative spoke with at DME  Agency: [place]:  (patient's spouse declined to set up [facility] services prior to d/c)        Social Determinants of Health (SDOH) Interventions   Readmission Risk Interventions    04/18/2022    4:58 PM  Readmission Risk Prevention Plan  Transportation Screening Complete  Home Care Screening Complete  Medication Review (RN CM) Complete

## 2022-04-20 NOTE — Progress Notes (Signed)
PROGRESS NOTE  Devin Harmon  DOB: December 22, 1955  PCP: Venia Carbon, MD MWU:[id number]  DOA: 04/14/2022  LOS: 6 days  Hospital Day: 7  Brief narrative: Devin Harmon is a 66 y.o. male with PMH significant for ESRD-HD-MWF, HTN, systolic CHF, chronic anemia, BPH. Patient started feeling bad since his dialysis on 5/12.  On 5/15, family went to find him unresponsive on the couch.  EMS found him minimally responsive, hypoxic to 60s, placed on NRB and brought to the ED  In the ED, he required intubation for airway protection. CXR demonstrated multifocal PNA on R with pleural effusion.   Head CT did not show any acute intracranial abnormality  Admitted to ICU Dialysis was resumed. 5/18, extubated, family made DNR and DNI 519, transferred out to [facility]   Subjective: Patient was seen and examined this morning. RN called for rapid response as patient was difficult to arouse.  Patient daughter was at bedside. Patient received Seroquel 25 mg last night and 1 dose of Haldol 5 mg IV at midnight.  Family stated that this morning he was up and was able to take his breakfast.  Later in the morning, RN could not wake him up.  He was moaning and would move extremities on touch stimulus but did not open eyes.  RN called for rapid response and I attended the patient at the time. Family was at bedside. Blood gas was obtained.  Patient screamed at the time.  Blood gas with very normal CO2 level.  Principal Problem:   Respiratory failure with hypoxia ([place]) Active Problems:   ESRD (end stage renal disease) (HCC)   Recurrent right pleural effusion   Community acquired pneumonia of right lung   Protein-calorie malnutrition, severe   Goals of care, counseling/discussion   Pressure injury of skin    Assessment and Plan: Acute respiratory failure with hypoxia -Due to the combination of volume overload and pneumonia -Initial chest x-ray read as multifocal pneumonia which could also be asymmetric  infiltrates due to pulm edema.  -To complete 5-day course of broad-spectrum antibiotics -Required intubation in the ED.  Underwent one-way extubation on 5/18 -Currently on oxygen by nasal cannula.  Check ambulatory oxygen requirement to plan for home oxygen -Encourage mobility, pulm hygiene, incentive spirometry, Mucinex -Blood gas this morning with a normal CO2 level.  Volume overload status ESRD-HD-MWF -Chest x-ray on admission showed pulm edema -Volume status improving with dialysis.  Acute metabolic encephalopathy  Vascular dementia  -CT head 5/15: no acute infarction; ammonia and tsh WNL -Delirium precautions.  -He had an episode of minimal responsiveness this morning.  Normal CO2 level.  I think his mental status is fluctuating and should improve.  Discussed with family at bedside.  If no improvement in neck several hours, can obtain a CT head but I doubt that he had any acute neurologic event. -Continue nightly Seroquel and as needed Haldol.   Hx combined systolic and systolic heart failure  Essential hypertension  -Echo from July 2021 with EF 40-45%, G1DD, -Continue amlodipine  Goals of care   Code Status: DNR    Mobility: Pending PT eval  Skin assessment:  Pressure Injury 04/19/22 Back Right;Lower Stage 2 -  Partial thickness loss of dermis presenting as a shallow open injury with a red, pink wound bed without slough. (Active)  04/19/22 0700  Location: Back  Location Orientation: Right;Lower  Staging: Stage 2 -  Partial thickness loss of dermis presenting as a shallow open injury with a red, pink  wound bed without slough.  Wound Description (Comments):   Present on Admission:     Nutritional status:  Body mass index is 20.06 kg/m.  Nutrition Problem: Severe Malnutrition Etiology: chronic illness (ESRD, CHF, vascular dementia) Signs/Symptoms: moderate fat depletion, severe muscle depletion, percent weight loss (7.2% weight loss in 2 months) Percent weight loss:  7.2 %     Diet:  Diet Order             DIET DYS 2 Room service appropriate? Yes; Fluid consistency: Thin  Diet effective now                   DVT prophylaxis:  heparin injection 5,000 Units Start: 04/14/22 1400 SCDs Start: 04/14/22 1119   Antimicrobials: Completed the course Fluid: None Consultants: PCCM Family Communication: Family at bedside  Status is: Inpatient  Continue in-hospital care because: Improving Level of care: Progressive   Dispo: The patient is from: Home              Anticipated d/c is to: Family wants to take him home and not to a facility.  Hopefully home with therapy tomorrow              Patient currently is not medically stable to d/c.   Difficult to place patient No     Infusions:   sodium chloride 10 mL/hr at 04/19/22 0354   dextrose 50 mL/hr at 04/20/22 1059    Scheduled Meds:  amLODipine  5 mg Oral Daily   Chlorhexidine Gluconate Cloth  6 each Topical Q0600   darbepoetin (ARANESP) injection - DIALYSIS  200 mcg Intravenous Q Fri-HD   docusate sodium  100 mg Oral BID   doxercalciferol  5 mcg Intravenous Once per day on Mon Fri   feeding supplement (NEPRO CARB STEADY)  237 mL Oral TID BM   ferric citrate  420 mg Oral Q breakfast   guaiFENesin  1,200 mg Oral BID   heparin  5,000 Units Subcutaneous Q8H   mouth rinse  15 mL Mouth Rinse BID   multivitamin  1 tablet Oral QHS   polyethylene glycol  17 g Oral Daily   QUEtiapine  25 mg Oral QHS    PRN meds: sodium chloride, albuterol, docusate, haloperidol lactate, heparin, polyethylene glycol   Antimicrobials: Anti-infectives (From admission, onward)    Start     Dose/Rate Route Frequency Ordered Stop   04/16/22 2000  vancomycin (VANCOREADY) IVPB 500 mg/100 mL  Status:  Discontinued        500 mg 100 mL/hr over 60 Minutes Intravenous Every M-W-F (Hemodialysis) 04/14/22 1440 04/14/22 1924   04/14/22 1445  ceFEPIme (MAXIPIME) 1 g in sodium chloride 0.9 % 100 mL IVPB        1  g 200 mL/hr over 30 Minutes Intravenous Daily-1800 04/14/22 1351 04/19/22 0440   04/14/22 1430  azithromycin (ZITHROMAX) 250 mg in dextrose 5 % 125 mL IVPB        250 mg 127.5 mL/hr over 60 Minutes Intravenous Every 24 hours 04/14/22 1337 04/18/22 2359   04/14/22 1300  ceFEPIme (MAXIPIME) 1 g in sodium chloride 0.9 % 100 mL IVPB  Status:  Discontinued        1 g 200 mL/hr over 30 Minutes Intravenous Every 24 hours 04/14/22 1209 04/14/22 1351   04/14/22 1218  vancomycin variable dose per unstable renal function (pharmacist dosing)  Status:  Discontinued         Does not apply See admin instructions  04/14/22 1218 04/14/22 1440   04/14/22 1215  vancomycin (VANCOREADY) IVPB 1500 mg/300 mL        1,500 mg 150 mL/hr over 120 Minutes Intravenous  Once 04/14/22 1209 04/14/22 1635   04/14/22 1000  cefTRIAXone (ROCEPHIN) 2 g in sodium chloride 0.9 % 100 mL IVPB  Status:  Discontinued        2 g 200 mL/hr over 30 Minutes Intravenous Every 24 hours 04/14/22 0948 04/14/22 1205   04/14/22 1000  azithromycin (ZITHROMAX) 500 mg in sodium chloride 0.9 % 250 mL IVPB  Status:  Discontinued        500 mg 250 mL/hr over 60 Minutes Intravenous Every 24 hours 04/14/22 0948 04/14/22 1205       Objective: Vitals:   04/20/22 0717 04/20/22 1040  BP: (!) 141/84 (!) 132/92  Pulse: 67 70  Resp: 20 (!) 33  Temp: 98 F (36.7 C) 97.9 F (36.6 C)  SpO2: 98% 96%    Intake/Output Summary (Last 24 hours) at 04/20/2022 1125 Last data filed at 04/19/2022 2300 Gross per 24 hour  Intake 181.92 ml  Output --  Net 181.92 ml    Filed Weights   04/18/22 2220 04/19/22 0220 04/20/22 0652  Weight: 59.3 kg 58 kg 58.1 kg   Weight change: -1.2 kg Body mass index is 20.06 kg/m.   Physical Exam: General exam: Pleasant, elderly African-American male.  Not in physical distress Skin: No rashes, lesions or ulcers. HEENT: Atraumatic, normocephalic, no obvious bleeding Lungs: Mild bilateral crackles in the bases, coughs  on deep breathing CVS: Regular rate and rhythm, no murmur GI/Abd soft, nontender, nondistended, bowel sound present CNS: Eyes open eyes on sternal rub.  Mental status fluctuating today. Extremities: No pedal edema, no calf tenderness  Data Review: I have personally reviewed the laboratory data and studies available.  F/u labs ordered Unresulted Labs (From admission, onward)     Start     Ordered   04/14/22 1121  Culture, Respiratory w Gram Stain  Once,   R        04/14/22 1120   04/14/22 0948  Urine Culture  (Septic presentation on arrival (screening labs, nursing and treatment orders for obvious sepsis))  ONCE - URGENT,   URGENT       Question:  Indication  Answer:  Sepsis   04/14/22 0948   04/14/22 0940  Urinalysis, Routine w reflex microscopic  Once,   URGENT        04/14/22 1324            Signed, Terrilee Croak, MD [facility] 04/20/2022

## 2022-04-20 NOTE — Plan of Care (Signed)
Discussed wu=ith patient plan of care for the evening, pain management and breathing with no evidence of learning at this time.   Problem: Health Behavior/Discharge Planning: Goal: Ability to manage health-related needs will improve Outcome: Not Progressing   Problem: Education: Goal: Knowledge of General Education information will improve Description: Including pain rating scale, medication(s)/side effects and non-pharmacologic comfort measures Outcome: Not Progressing

## 2022-04-20 NOTE — Progress Notes (Addendum)
Pt repeatedly calling out and yelling despite reorienting. He is due for, and needs, next dose of PRN Haldol, However, tele made RN aware that pt has wide QRS complexes earlier in shift. Save strip was entered.  Marni Griffon MD paged via Amion to advise.

## 2022-04-20 NOTE — Progress Notes (Signed)
Aileen Fass DO informed of measures taken throughout the shift to decrease the patient's RR 30-40's.  Mouth care, suctioning and increasing oxygen since he is a mouth breather.  Patient down to RR 17-21 MD made aware and just had me monitor patient throughout the night.  Scheduled Seroquel and prn haldol given.

## 2022-04-20 NOTE — Progress Notes (Signed)
[facility] Progress Note  Subjective: seen in room, pt's wife states that w/ her arrival his anxiety and SOB yesterday improved and he did not need bipap.  This am pt is sleeping, didn't not awaken.  Per pts wife she had him up last night standing at the sink. She thinks he is doing relatively well and is looking forward to getting him home.   Vitals:   04/20/22 0400 04/20/22 0500 04/20/22 0652 04/20/22 0717  BP: (!) 144/91 (!) 137/98  (!) 141/84  Pulse: 72 74 73 67  Resp: (!) 22 (!) 23 (!) 32 20  Temp:    98 F (36.7 C)  TempSrc:    Axillary  SpO2: 97% 96% 100% 98%  Weight:   58.1 kg   Height:        Exam:  alert, nad   no jvd  Chest cta bilat  Cor reg no RG  Abd soft ntnd no ascites   Ext no LE edema   Alert, NF, ox3   [facility] TDC intact      Home meds include - norvasc 10, auryxia 2 ac, percocet prn, prns/ vits/ supps    CXR 5/16 - IMPRESSION: moderate to severe cardiomegaly. Mild central vascular fullness appears similar. There are no overt edema findings. Small right pleural effusion and patchy consolidation in the right lower lung field continue to be noted.    OP HD: [place] M-F (2 hd/ week)  3.5h  400/1.5  63.5kg  P2   Heparin 3000   [facility] TDC - mircera 200 ug q2, last 5/05, due 5/19 - venofer 100mg  q hd IV, 5/05 - 5/19 - hectorol 5 ug IV tiw   Assessment/ Plan: Acute hypoxic resp failure - much better due to HCAP primarily. On [place] O2.   PNA - presumed HCAP, on IV vanc/ cefepime AMS/ dementia - chronic issue per pt's wife  ESRD - on HD M-F, 2x per week. Next HD Monday.   HTN/ vol - cont home meds. Under dry wt by 5kg here. No edema on exam. Small UF goal 1-2 L w/ HD tomorrow.  Anemia esrd - Hb 10s, esa was due, got 200 ug darbe here on 5/20. Cont q Fri while here. Holding IV Fe w/ acute infection.  BMD ckd - CCa in range, phos slightly up. Cont binders, cont IV vdra DNR       Kelly Splinter 04/20/2022, 10:16 AM   Recent Labs  Lab 04/14/22 0937  04/14/22 1104 04/17/22 0320 04/18/22 0230 04/20/22 0027  HGB 11.2*   < > 10.3* 10.6* 11.5*  ALBUMIN 3.6  --   --   --   --   CALCIUM 9.0   < > 8.5* 9.1 8.9  PHOS  --    < > 5.9* 6.5*  --   CREATININE 10.69*   < > 5.08* 6.48* 5.53*  K 5.0   < > 4.6 4.9 4.3   < > = values in this interval not displayed.    Inpatient medications:  amLODipine  5 mg Oral Daily   Chlorhexidine Gluconate Cloth  6 each Topical Q0600   darbepoetin (ARANESP) injection - DIALYSIS  200 mcg Intravenous Q Fri-HD   docusate sodium  100 mg Oral BID   doxercalciferol  5 mcg Intravenous Once per day on Mon Fri   feeding supplement (NEPRO CARB STEADY)  237 mL Oral TID BM   ferric citrate  420 mg Oral Q breakfast   guaiFENesin  1,200 mg  Oral BID   heparin  5,000 Units Subcutaneous Q8H   mouth rinse  15 mL Mouth Rinse BID   multivitamin  1 tablet Oral QHS   polyethylene glycol  17 g Oral Daily   QUEtiapine  25 mg Oral QHS    sodium chloride 10 mL/hr at 04/19/22 0354   sodium chloride, albuterol, docusate, haloperidol lactate, heparin, polyethylene glycol

## 2022-04-20 NOTE — Progress Notes (Signed)
SATURATION QUALIFICATIONS:  Patient Saturations on Room Air at Rest = 93%  Patient Saturations on [employer] while Ambulating = N/A Chairfast

## 2022-04-20 NOTE — Progress Notes (Addendum)
At 10 med pass, pt would not arouse. RN tried pressing on nail bed, trapezius squeeze with no response. Pt finally reacted to sternal rub after approximately 4 to 5 seconds. Pt does not open eyes or respond other than to swat at RN with one arm. Second attempt to arouse with sternal rub made. Same outcome. Rapid and RT called. Pt had similar episode day prior with tachypnea and potentially needing Bipap. Rapid and RT at bedside. Marni Griffon MD paged via Shea Evans. Provider at bedside minutes later. Stat ABG.   1040: CBG obtained. Result of 71. Dahal MD secure chatted to advise on interventions, as pt appears unable to protect his airway at this time.   1050: see new orders

## 2022-04-20 NOTE — Plan of Care (Signed)
  Problem: Education: Goal: Knowledge of General Education information will improve Description: Including pain rating scale, medication(s)/side effects and non-pharmacologic comfort measures Outcome: Not Progressing   Problem: Health Behavior/Discharge Planning: Goal: Ability to manage health-related needs will improve Outcome: Not Progressing   Problem: Activity: Goal: Risk for activity intolerance will decrease Outcome: Not Progressing   Problem: Nutrition: Goal: Adequate nutrition will be maintained Outcome: Not Progressing   Problem: Coping: Goal: Level of anxiety will decrease Outcome: Not Progressing

## 2022-04-20 NOTE — TOC Progression Note (Signed)
Transition of Care [facility]) - Progression Note    Patient Details  Name: Devin Harmon MRN: [id number] Date of Birth: 1956-03-07  [facility]) CM/SW [place], LCSW Phone Number:[phone] 04/20/2022, 2:47 PM  Clinical Narrative:     CSW spoke with pt's wife and confirmed that SNF placement was denied and she was open to [facility] CSW alerted RN CM of pt's DC.  TOC team will continue to assist with discharge planning needs.   Expected Discharge Plan: Home/Self Care Barriers to Discharge: Continued Medical Work up  Expected Discharge Plan and Services Expected Discharge Plan: Home/Self Care In-house Referral: Clinical Social Work Discharge Planning Services: CM Consult Post Acute Care Choice: Durable Medical Equipment Living arrangements for the past 2 months: Single Family Home Expected Discharge Date: 04/20/22               DME Arranged: Oxygen DME Agency: [employer] Date DME Agency Contacted: 04/20/22 Time DME Agency Contacted: [phone] Representative spoke with at DME Agency: [name]:  (patient's spouse declined to set up [facility] services prior to d/c)           Social Determinants of Health (SDOH) Interventions    Readmission Risk Interventions    04/18/2022    4:58 PM  Readmission Risk Prevention Plan  Transportation Screening Complete  Home Care Screening Complete  Medication Review (RN CM) Complete

## 2022-04-20 NOTE — Discharge Summary (Signed)
Physician Discharge Summary  Devin Harmon FUX:[id number] DOB: 1956-03-19 DOA: 04/14/2022  PCP: Venia Carbon, MD  Admit date: 04/14/2022 Discharge date: 04/20/2022  Admitted From: Home Discharge disposition: Home with home health PT, OT, RN  Recommendations at discharge:  Continue to monitor mental status at home.  Use nightly Seroquel.  Maintain sleep rhythm.  Brief narrative: Devin Harmon is a 66 y.o. male with PMH significant for ESRD-HD-MWF, HTN, systolic CHF, chronic anemia, BPH. Patient started feeling bad since his dialysis on 5/12.  On 5/15, family went to find him unresponsive on the couch.  EMS found him minimally responsive, hypoxic to 60s, placed on NRB and brought to the ED  In the ED, he required intubation for airway protection. CXR demonstrated multifocal PNA on R with pleural effusion.   Head CT did not show any acute intracranial abnormality  Admitted to ICU Dialysis was resumed. 5/18, extubated, family made DNR and DNI 519, transferred out to [facility]  Subjective: Patient was seen and examined this morning. RN called for rapid response as patient was difficult to arouse.  Patient daughter was at bedside. Patient received Seroquel 25 mg last night and 1 dose of Haldol 5 mg IV at midnight.  Family stated that this morning he was up and was able to take his breakfast.  Later in the morning, RN could not wake him up.  He was moaning and would move extremities on touch stimulus but did not open eyes.  RN called for rapid response and I attended the patient at the time. Family was at bedside. Blood gas was obtained.  Patient screamed at the time.  Blood gas with very normal CO2 level. Within next 1 hour, patient woke up.  He was able to eat.  He was monitored for next few hours. Patient's wife was at bedside for all this duration. As of this afternoon, she feels comfortable taking him home.  Principal Problem:   Respiratory failure with hypoxia ([place]) Active  Problems:   ESRD (end stage renal disease) (HCC)   Recurrent right pleural effusion   Community acquired pneumonia of right lung   Protein-calorie malnutrition, severe   Goals of care, counseling/discussion   Pressure injury of skin    Assessment and Plan: Acute respiratory failure with hypoxia -Due to the combination of volume overload and pneumonia -Initial chest x-ray read as multifocal pneumonia which could also be asymmetric infiltrates due to pulm edema.  -Completed 5-day course of broad-spectrum antibiotics -Required intubation in the ED.  Underwent one-way extubation on 5/18 -Currently on oxygen by nasal cannula.  Check ambulatory oxygen requirement to plan for home oxygen -Encourage mobility, pulm hygiene, incentive spirometry, Mucinex -Blood gas this morning with a normal CO2 level. -Home oxygen arranged.  Volume overload status ESRD-HD-MWF -Chest x-ray on admission showed pulm edema -Volume status improving with dialysis.  Acute metabolic encephalopathy  Vascular dementia  -CT head 5/15: no acute infarction; ammonia and tsh WNL -Delirium precautions.  -He had an episode of minimal responsiveness this morning.  Normal CO2 level.   -Within next 1 hour, patient woke up.  He was able to eat.  He was monitored for next few hours. Patient's wife was at bedside for all this duration. -Patient has benefited from nightly Seroquel in the hospital.  Continue the same at home.  Prescription given for a month supply.     Hx combined systolic and systolic heart failure  Essential hypertension  -Echo from July 2021 with EF 40-45%, G1DD, -  Continue amlodipine  Goals of care   Code Status: DNR    Mobility: Pending PT eval  Skin assessment:  Pressure Injury 04/19/22 Back Right;Lower Stage 2 -  Partial thickness loss of dermis presenting as a shallow open injury with a red, pink wound bed without slough. (Active)  04/19/22 0700  Location: Back  Location Orientation:  Right;Lower  Staging: Stage 2 -  Partial thickness loss of dermis presenting as a shallow open injury with a red, pink wound bed without slough.  Wound Description (Comments):   Present on Admission:     Nutritional status:  Body mass index is 20.06 kg/m.  Nutrition Problem: Severe Malnutrition Etiology: chronic illness (ESRD, CHF, vascular dementia) Signs/Symptoms: moderate fat depletion, severe muscle depletion, percent weight loss (7.2% weight loss in 2 months) Percent weight loss: 7.2 %      Wounds:  - Incision (Closed) 05/17/20 Arm Left (Active)  Date First Assessed/Time First Assessed: 05/17/20 1249   Location: Arm  Location Orientation: Left    Assessments 05/17/2020  2:27 PM 05/17/2020  4:00 PM  Dressing Type Compression wrap Compression wrap  Dressing Clean;Dry;Intact Clean;Dry;Intact  Site / Wound Assessment -- Dressing in place / Unable to assess  Drainage Amount None None     No Linked orders to display     Incision (Closed) 02/27/22 Arm Left (Active)  Date First Assessed/Time First Assessed: 02/27/22 0821   Location: Arm  Location Orientation: Left    Assessments 02/27/2022  8:37 AM  Dressing Type Compression wrap  Dressing Clean, Dry, Intact     No Linked orders to display     Wound / Incision (Open or Dehisced) 04/14/22 Incision - Open Pretibial Proximal;Right L 2cm x  W 2cm (Active)  Date First Assessed/Time First Assessed: 04/14/22 1418   Wound Type: Incision - Open  Location: Pretibial  Location Orientation: Proximal;Right  Wound Description (Comments): L 2cm x  W 2cm  Present on Admission: Yes    Assessments 04/14/2022  1:48 PM 04/20/2022 11:00 AM  Dressing Type Foam - Lift dressing to assess site every shift Foam - Lift dressing to assess site every shift  Dressing Changed New --  Dressing Status -- Clean, Dry, Intact  Dressing Change Frequency -- PRN  Wound Length (cm) 2 cm --  Wound Width (cm) 2 cm --  Wound Surface Area (cm^2) 4 cm^2 --  Treatment  Cleansed --     No Linked orders to display     Wound / Incision (Open or Dehisced) 04/14/22 Pretibial Left;Mid W  2 cm x  L 2cm (Active)  Date First Assessed/Time First Assessed: 04/14/22 1419   Location: Pretibial  Location Orientation: Left;Mid  Wound Description (Comments): W  2 cm x  L 2cm  Present on Admission: Yes    Assessments 04/14/2022  1:48 PM 04/20/2022 11:00 AM  Dressing Type Foam - Lift dressing to assess site every shift Foam - Lift dressing to assess site every shift  Dressing Changed New --  Dressing Status Clean, Dry, Intact Clean, Dry, Intact  Dressing Change Frequency -- PRN  Treatment Cleansed --     No Linked orders to display     Pressure Injury 04/19/22 Back Right;Lower Stage 2 -  Partial thickness loss of dermis presenting as a shallow open injury with a red, pink wound bed without slough. (Active)  Date First Assessed/Time First Assessed: 04/19/22 0700   Location: Back  Location Orientation: Right;Lower  Staging: Stage 2 -  Partial thickness loss of  dermis presenting as a shallow open injury with a red, pink wound bed without slough.    Assessments 04/19/2022  7:00 AM 04/20/2022 11:00 AM  Dressing Type Foam - Lift dressing to assess site every shift Foam - Lift dressing to assess site every shift  Dressing Changed Clean, Dry, Intact  Dressing Change Frequency PRN PRN  Site / Wound Assessment Clean;Dry Clean;Dry  Wound Length (cm) 1.3 cm --  Wound Width (cm) 1.2 cm --  Wound Surface Area (cm^2) 1.56 cm^2 --  Tunneling (cm) -- 0  Undermining (cm) -- 0  Drainage Amount None None     No Linked orders to display    Discharge Exam:   Vitals:   04/20/22 0500 04/20/22 0652 04/20/22 0717 04/20/22 1040  BP: (!) 137/98  (!) 141/84 (!) 132/92  Pulse: 74 73 67 70  Resp: (!) 23 (!) 32 20 (!) 33  Temp:   98 F (36.7 C) 97.9 F (36.6 C)  TempSrc:   Axillary Axillary  SpO2: 96% 100% 98% 96%  Weight:  58.1 kg    Height:        Body mass index is 20.06 kg/m.   General exam: Pleasant, elderly African-American male.  Not in physical distress Skin: No rashes, lesions or ulcers. HEENT: Atraumatic, normocephalic, no obvious bleeding Lungs: Clear to auscultation bilaterally except for mild occasional coughs CVS: Regular rate and rhythm, no murmur GI/Abd soft, nontender, nondistended, bowel sound present CNS: Alert, awake, oriented to place.  Mental status at baseline Extremities: No pedal edema, no calf tenderness  Follow ups:    Follow-up Information     Venia Carbon, MD Follow up.   Specialties: Internal Medicine, Pediatrics Contact information: [place] [address] [phone]                 Discharge Instructions:   Discharge Instructions     Call MD for:  difficulty breathing, headache or visual disturbances   Complete by: As directed    Call MD for:  extreme fatigue   Complete by: As directed    Call MD for:  hives   Complete by: As directed    Call MD for:  persistant dizziness or light-headedness   Complete by: As directed    Call MD for:  persistant nausea and vomiting   Complete by: As directed    Call MD for:  severe uncontrolled pain   Complete by: As directed    Call MD for:  temperature >100.4   Complete by: As directed    Diet general   Complete by: As directed    Discharge instructions   Complete by: As directed    Recommendations at discharge:   Continue to monitor mental status at home.  Use nightly Seroquel.  Maintain sleep rhythm.  General discharge instructions: Follow with Primary MD Venia Carbon, MD in 7 days  Please request your PCP  to go over your hospital tests, procedures, radiology results at the follow up. Please get your medicines reviewed and adjusted.  Your PCP may decide to repeat certain labs or tests as needed. Do not drive, operate heavy machinery, perform activities at heights, swimming or participation in water activities or provide baby sitting  services if your were admitted for syncope or siezures until you have seen by Primary MD or a Neurologist and advised to do so again. [place] Controlled Substance Reporting System database was reviewed. Do not drive, operate heavy machinery, perform activities  at heights, swim, participate in water activities or provide baby-sitting services while on medications for pain, sleep and mood until your outpatient physician has reevaluated you and advised to do so again.  You are strongly recommended to comply with the dose, frequency and duration of prescribed medications. Activity: As tolerated with Full fall precautions use walker/cane & assistance as needed Avoid using any recreational substances like cigarette, tobacco, alcohol, or non-prescribed drug. If you experience worsening of your admission symptoms, develop shortness of breath, life threatening emergency, suicidal or homicidal thoughts you must seek medical attention immediately by calling 911 or calling your MD immediately  if symptoms less severe. You must read complete instructions/literature along with all the possible adverse reactions/side effects for all the medicines you take and that have been prescribed to you. Take any new medicine only after you have completely understood and accepted all the possible adverse reactions/side effects.  Wear Seat belts while driving. You were cared for by a hospitalist during your hospital stay. If you have any questions about your discharge medications or the care you received while you were in the hospital after you are discharged, you can call the unit and ask to speak with the hospitalist or the covering physician. Once you are discharged, your primary care physician will handle any further medical issues. Please note that NO REFILLS for any discharge medications will be authorized once you are discharged, as it is imperative that you return to your primary care physician (or establish a  relationship with a primary care physician if you do not have one).   Discharge wound care:   Complete by: As directed    Increase activity slowly   Complete by: As directed        Discharge Medications:   Allergies as of 04/20/2022       Reactions   Sulfa Antibiotics Hives   Sulfonamide Derivatives Hives   Oxycodone    Hallucination   Chlorthalidone Other (See Comments)   felt "bad" and ED   Hydrocodone Nausea Only   dizzy        Medication List     STOP taking these medications    oxyCODONE-acetaminophen 5-325 MG tablet Commonly known as: Percocet       TAKE these medications    acetaminophen 500 MG tablet Commonly known as: TYLENOL Take 500 mg by mouth every 6 (six) hours as needed.   acetaminophen 325 MG tablet Commonly known as: TYLENOL Take 650 mg by mouth every 6 (six) hours as needed. Given during dialysis   amLODipine 10 MG tablet Commonly known as: NORVASC TAKE 1 TABLET BY MOUTH EVERY DAY   Auryxia 1 GM 210 MG(Fe) tablet Generic drug: ferric citrate Take 420 mg by mouth daily.   cetirizine 10 MG tablet Commonly known as: ZYRTEC Take 10 mg by mouth daily as needed for allergies.   CORICIDIN HBP COLD/COUGH/FLU PO Take 2 tablets by mouth daily as needed (cold symptoms).   docusate sodium 100 MG capsule Commonly known as: COLACE Take 1 capsule (100 mg total) by mouth 2 (two) times daily.   feeding supplement (NEPRO CARB STEADY) Liqd Take 237 mLs by mouth 3 (three) times daily between meals.   guaiFENesin 600 MG 12 hr tablet Commonly known as: MUCINEX Take 2 tablets (1,200 mg total) by mouth 2 (two) times daily.   hydrocortisone cream 1 % Apply 1 application topically daily as needed (rash).   neomycin-bacitracin-polymyxin [id number] ointment Commonly known as: NEOSPORIN Apply 1 application topically daily  as needed for wound care.   QUEtiapine 25 MG tablet Commonly known as: SEROQUEL Take 1 tablet (25 mg total) by mouth at  bedtime.   Rena-Vite Rx 1 MG Tabs Take 1 mg by mouth daily.               Durable Medical Equipment  (From admission, onward)           Start     Ordered   04/20/22 1425  For home use only DME oxygen  Once       Question Answer Comment  Length of Need Lifetime   Mode or (Route) Nasal cannula   Liters per Minute 4   Frequency Continuous (stationary and portable oxygen unit needed)   Oxygen conserving device Yes   Oxygen delivery system Gas      04/20/22 1424              Discharge Care Instructions  (From admission, onward)           Start     Ordered   04/20/22 0000  Discharge wound care:        04/20/22 1430             The results of significant diagnostics from this hospitalization (including imaging, microbiology, ancillary and laboratory) are listed below for reference.    Procedures and Diagnostic Studies:   CT Head Wo Contrast  Result Date: 04/14/2022 CLINICAL DATA:  Altered mental status. EXAM: CT HEAD WITHOUT CONTRAST TECHNIQUE: Contiguous axial images were obtained from the base of the skull through the vertex without intravenous contrast. RADIATION DOSE REDUCTION: This exam was performed according to the departmental dose-optimization program which includes automated exposure control, adjustment of the mA and/or kV according to patient size and/or use of iterative reconstruction technique. COMPARISON:  CT head dated Apr 13, 2019. FINDINGS: Brain: No evidence of acute infarction, hemorrhage, hydrocephalus, extra-axial collection or mass lesion/mass effect. Relatively unchanged moderate atrophy and chronic microvascular ischemic changes. Remote lacunar infarct in the left basal ganglia again noted. Vascular: Calcified atherosclerosis at the skull base. No hyperdense vessel. Skull: Normal. Negative for fracture or focal lesion. Sinuses/Orbits: Opacification of the nasopharynx presumably related to endotracheal and enteric intubation. The sinuses  and mastoid air cells are mostly clear. The orbits are unremarkable. Other: Asymmetric right-sided scalp soft tissue swelling around the ear. IMPRESSION: 1. No acute intracranial abnormality. 2. Asymmetric right-sided scalp soft tissue swelling around the ear. Clear right mastoid air cells and middle ear. Electronically Signed   By: Titus Dubin M.D.   On: 04/14/2022 13:17   DG Chest Port 1 View  Result Date: 04/15/2022 CLINICAL DATA:  Ventilator dependent respiratory failure. EXAM: PORTABLE CHEST 1 VIEW COMPARISON:  Portable chest yesterday at 10:11 a.m. FINDINGS: 5:00 a.m., 04/15/2022. ETT tip is 5.6 cm from the carina, NGT tip is probably in the body of stomach based on the positioning of the proximal side-hole. Left IJ dialysis catheter tip remains in the upper right atrium. There is moderate to severe cardiomegaly. Cardiac size is stable. There is a aortic atherosclerosis with stable mediastinal configuration. Mild central vascular fullness appears similar. There are no overt edema findings. Small right pleural effusion and patchy consolidation in the right lower lung field continue to be noted. Left lung remains clear. The right lung opacities are slightly less confluent than previously but in all other respects there are no further changes. No acute osseous abnormality. IMPRESSION: Slight improvement in right lower zonal opacities. Stable small  right pleural effusion and additional stable findings described above. Moderate severe cardiomegaly. Electronically Signed   By: Telford Nab M.D.   On: 04/15/2022 07:28   DG Chest Port 1 View  Result Date: 04/14/2022 CLINICAL DATA:  Found unresponsive.  Possible sepsis. EXAM: PORTABLE CHEST 1 VIEW COMPARISON:  Chest x-ray dated July 10, 2020. FINDINGS: Endotracheal tube tip in good position 3.0 cm above the carina. Enteric tube in the stomach. Unchanged tunneled left internal jugular dialysis catheter with tip at the cavoatrial junction. Unchanged  cardiomegaly. Volume loss in the right hemithorax with hazy airspace opacities in the right mid and lower lung. Associated small right pleural effusion. The left lung is clear. No pneumothorax. No acute osseous abnormality. IMPRESSION: 1. Appropriately positioned endotracheal tube. 2. Multifocal pneumonia in the right lung with associated pleural effusion. Followup PA and lateral chest X-ray is recommended in 3-4 weeks following trial of antibiotic therapy to ensure resolution. Electronically Signed   By: Titus Dubin M.D.   On: 04/14/2022 10:29   DG Abd Portable 1 View  Result Date: 04/14/2022 CLINICAL DATA:  Found unresponsive.  Sepsis. EXAM: PORTABLE ABDOMEN - 1 VIEW COMPARISON:  Abdominal x-ray dated Apr 12, 2019. FINDINGS: Enteric tube tip in the stomach. The bowel gas pattern is normal. No radio-opaque calculi or other significant radiographic abnormality are seen. No acute osseous abnormality. IMPRESSION: 1. Enteric tube tip in the stomach. 2. No acute findings. Electronically Signed   By: Titus Dubin M.D.   On: 04/14/2022 10:30     Labs:   Basic Metabolic Panel: Recent Labs  Lab 04/14/22 1451 04/15/22 0428 04/16/22 0140 04/17/22 0320 04/18/22 0230 04/20/22 0027  NA  --  139 138 137 138 138  K  --  5.3* 4.7 4.6 4.9 4.3  CL  --  101 102 100 99 101  CO2  --  23 23 25 24 24   GLUCOSE  --  94 115* 114* 102* 84  BUN  --  81* 56* 36* 54* 37*  CREATININE  --  11.23* 7.86* 5.08* 6.48* 5.53*  CALCIUM  --  8.4* 7.8* 8.5* 9.1 8.9  MG  --  2.4 2.1 2.0 2.3  --   PHOS 9.9* 10.6* 7.8* 5.9* 6.5*  --    GFR Estimated Creatinine Clearance: 10.8 mL/min (A) (by C-G formula based on SCr of 5.53 mg/dL (H)). Liver Function Tests: Recent Labs  Lab 04/14/22 0937  AST 20  ALT 20  ALKPHOS 85  BILITOT 0.6  PROT 6.9  ALBUMIN 3.6   No results for input(s): LIPASE, AMYLASE in the last 168 hours. Recent Labs  Lab 04/14/22 1451  AMMONIA 38*   Coagulation profile Recent Labs  Lab  04/14/22 0937  INR 1.2    CBC: Recent Labs  Lab 04/14/22 0937 04/14/22 1104 04/15/22 0428 04/16/22 0140 04/17/22 0320 04/18/22 0230 04/20/22 0027  WBC 10.2  --  8.4 8.6 9.2 9.5 7.3  NEUTROABS 9.5*  --   --   --   --   --  6.0  HGB 11.2*   < > 10.2* 9.9* 10.3* 10.6* 11.5*  HCT 36.2*   < > 33.4* 32.4* 32.5* 34.7* 37.2*  MCV 94.8  --  95.2 95.3 93.1 93.3 92.3  PLT 253  --  206 123* 138* 186 226   < > = values in this interval not displayed.   Cardiac Enzymes: No results for input(s): CKTOTAL, CKMB, CKMBINDEX, TROPONINI in the last 168 hours. BNP: Invalid input(s): POCBNP CBG:  Recent Labs  Lab 04/19/22 2121 04/19/22 2337 04/20/22 0353 04/20/22 0731 04/20/22 1038  GLUCAP 88 87 83 78 71   D-Dimer No results for input(s): DDIMER in the last 72 hours. Hgb A1c No results for input(s): HGBA1C in the last 72 hours. Lipid Profile No results for input(s): CHOL, HDL, LDLCALC, TRIG, CHOLHDL, LDLDIRECT in the last 72 hours. Thyroid function studies No results for input(s): TSH, T4TOTAL, T3FREE, THYROIDAB in the last 72 hours.  Invalid input(s): FREET3 Anemia work up No results for input(s): VITAMINB12, FOLATE, FERRITIN, TIBC, IRON, RETICCTPCT in the last 72 hours. Microbiology Recent Results (from the past 240 hour(s))  Culture, blood (Routine x 2)     Status: None   Collection Time: 04/14/22 10:23 AM   Specimen: BLOOD RIGHT HAND  Result Value Ref Range Status   Specimen Description BLOOD RIGHT HAND  Final   Special Requests   Final    BOTTLES DRAWN AEROBIC AND ANAEROBIC Blood Culture results may not be optimal due to an inadequate volume of blood received in culture bottles   Culture   Final    NO GROWTH 5 DAYS Performed at [facility], [place] [address], [place], [place] [address]    Report Status 04/19/2022 FINAL  Final  Resp Panel by RT-PCR (Flu A&B, Covid) Nasopharyngeal Swab     Status: None   Collection Time: 04/14/22 11:44 AM   Specimen: Nasopharyngeal  Swab; Nasopharyngeal(NP) swabs in vial transport medium  Result Value Ref Range Status   SARS Coronavirus 2 by RT PCR NEGATIVE NEGATIVE Final    Comment: (NOTE) SARS-CoV-2 target nucleic acids are NOT DETECTED.  The SARS-CoV-2 RNA is generally detectable in upper respiratory specimens during the acute phase of infection. The lowest concentration of SARS-CoV-2 viral copies this assay can detect is 138 copies/mL. A negative result does not preclude SARS-Cov-2 infection and should not be used as the sole basis for treatment or other patient management decisions. A negative result may occur with  improper specimen collection/handling, submission of specimen other than nasopharyngeal swab, presence of viral mutation(s) within the areas targeted by this assay, and inadequate number of viral copies(<138 copies/mL). A negative result must be combined with clinical observations, patient history, and epidemiological information. The expected result is Negative.  Fact Sheet for Patients:  EntrepreneurPulse.com.au  Fact Sheet for Healthcare Providers:  IncredibleEmployment.be  This test is no t yet approved or cleared by the [place] FDA and  has been authorized for detection and/or diagnosis of SARS-CoV-2 by FDA under an Emergency Use Authorization (EUA). This EUA will remain  in effect (meaning this test can be used) for the duration of the COVID-19 declaration under Section 564(b)(1) of the Act, 21 U.S.C.section 360bbb-3(b)(1), unless the authorization is terminated  or revoked sooner.       Influenza A by PCR NEGATIVE NEGATIVE Final   Influenza B by PCR NEGATIVE NEGATIVE Final    Comment: (NOTE) The Xpert Xpress SARS-CoV-2/FLU/RSV plus assay is intended as an aid in the diagnosis of influenza from Nasopharyngeal swab specimens and should not be used as a sole basis for treatment. Nasal washings and aspirates are unacceptable for Xpert Xpress  SARS-CoV-2/FLU/RSV testing.  Fact Sheet for Patients: EntrepreneurPulse.com.au  Fact Sheet for Healthcare Providers: IncredibleEmployment.be  This test is not yet approved or cleared by the [place] FDA and has been authorized for detection and/or diagnosis of SARS-CoV-2 by FDA under an Emergency Use Authorization (EUA). This EUA will remain in effect (meaning this  test can be used) for the duration of the COVID-19 declaration under Section 564(b)(1) of the Act, 21 U.S.C. section 360bbb-3(b)(1), unless the authorization is terminated or revoked.  Performed at [facility], [place] [address], [place], [name] [address]   MRSA Next Gen by PCR, Nasal     Status: None   Collection Time: 04/14/22  2:35 PM   Specimen: Nasal Mucosa; Nasal Swab  Result Value Ref Range Status   MRSA by PCR Next Gen NOT DETECTED NOT DETECTED Final    Comment: (NOTE) The GeneXpert MRSA Assay (FDA approved for NASAL specimens only), is one component of a comprehensive MRSA colonization surveillance program. It is not intended to diagnose MRSA infection nor to guide or monitor treatment for MRSA infections. Test performance is not FDA approved in patients less than 23 years old. Performed at [facility], [place] [address], [place], [place] [address]   Culture, blood (Routine x 2)     Status: None   Collection Time: 04/14/22  2:51 PM   Specimen: BLOOD RIGHT HAND  Result Value Ref Range Status   Specimen Description BLOOD RIGHT HAND  Final   Special Requests   Final    BOTTLES DRAWN AEROBIC AND ANAEROBIC Blood Culture adequate volume   Culture   Final    NO GROWTH 5 DAYS Performed at [facility], [place] [address], [place], [place] [address]    Report Status 04/19/2022 FINAL  Final  Culture, Respiratory w Gram Stain     Status: None   Collection Time: 04/16/22  9:49 AM   Specimen: Tracheal Aspirate; Respiratory  Result Value Ref Range Status    Specimen Description TRACHEAL ASPIRATE  Final   Special Requests NONE  Final   Gram Stain   Final    NO WBC SEEN RARE GRAM POSITIVE COCCI IN PAIRS RARE GRAM NEGATIVE RODS RARE GRAM POSITIVE RODS Performed at [facility], [place] [address], [place], [place] [address]    Culture   Final    MODERATE STAPHYLOCOCCUS EPIDERMIDIS RARE STENOTROPHOMONAS MALTOPHILIA    Report Status 04/20/2022 FINAL  Final   Organism ID, Bacteria STAPHYLOCOCCUS EPIDERMIDIS  Final   Organism ID, Bacteria STENOTROPHOMONAS MALTOPHILIA  Final      Susceptibility   Staphylococcus epidermidis - MIC*    CIPROFLOXACIN <=0.5 SENSITIVE Sensitive     ERYTHROMYCIN >=8 RESISTANT Resistant     GENTAMICIN <=0.5 SENSITIVE Sensitive     OXACILLIN >=4 RESISTANT Resistant     TETRACYCLINE <=1 SENSITIVE Sensitive     VANCOMYCIN 1 SENSITIVE Sensitive     TRIMETH/SULFA <=10 SENSITIVE Sensitive     CLINDAMYCIN <=0.25 SENSITIVE Sensitive     RIFAMPIN <=0.5 SENSITIVE Sensitive     Inducible Clindamycin NEGATIVE Sensitive     * MODERATE STAPHYLOCOCCUS EPIDERMIDIS   Stenotrophomonas maltophilia - MIC*    LEVOFLOXACIN 0.5 SENSITIVE Sensitive     TRIMETH/SULFA <=20 SENSITIVE Sensitive     * RARE STENOTROPHOMONAS MALTOPHILIA    Time coordinating discharge: 35 minutes  Signed: Wofford Stratton  [facility] 04/20/2022, 2:31 PM

## 2022-04-21 ENCOUNTER — Telehealth: Payer: Self-pay | Admitting: Nephrology

## 2022-04-21 ENCOUNTER — Telehealth: Payer: Self-pay

## 2022-04-21 NOTE — Telephone Encounter (Signed)
Transition of Care Contact from [place]  Date of Discharge: 04/20/22 Date of Contact: 04/21/22 Method of contact: phone - attempted  Attempted to contact patient to discuss transition of care from inpatient admission.  Patient did not answer the phone.  Will follow up with patient/patient wife at dialysis.  Jen Mow, PA-C [facility] Pager: [phone]

## 2022-04-21 NOTE — Telephone Encounter (Signed)
Attempted to contact patient/patient's spouse Devin Harmon to schedule a Palliative Care consult appointment. No answer left a message to return call.

## 2022-04-22 ENCOUNTER — Telehealth: Payer: Self-pay

## 2022-04-22 ENCOUNTER — Telehealth: Payer: Self-pay | Admitting: Internal Medicine

## 2022-04-22 NOTE — Telephone Encounter (Signed)
Spoke with patient's spouse [place] and scheduled a telephonic Palliative Consult for 05/01/22 @ 2:30 PM.  Consent obtained; updated Netsmart, Team List and Epic.

## 2022-04-22 NOTE — Telephone Encounter (Signed)
Spouse [place] returned call. Attempted to return her call to schedule a Palliative Care consult appointment. No answer left a message to return call.

## 2022-04-22 NOTE — Telephone Encounter (Signed)
Pt wife called and requested a call back from either Dr Silvio Pate or Larene Beach, She has some questions but wouldn't dislcose them. Callback is [phone]

## 2022-04-22 NOTE — Telephone Encounter (Signed)
Spoke to pt's wife. She has a few questions since leaving the hospital.  They suggested he start drinking Nypro Shake. Wife says it is too expensive. Does Dr Silvio Pate know of a good alternative? 2. [facility] Palliative consult is June 1st. 3. He was prescribed Seroquel to help him rest at night and not be aggrevated during the day.. Asking Dr Alla German opinion. He is sleeping more. Asking if that could be from the Seroquel. Does not want to get out of bed or stand. 4. They put him on Mucinex to help dry up congestion. Wanted to know Dr Alla German thoughts? 5. He had 3 good dialysis treatments at hospital. Worried about him missing anymore treatments since he does not want to get up.Any suggestions? 6. It was mentioned about starting home PT. Says since it is hard to get him up and moving, she would not be able to take him somewhere for PT. I advised her that he would need a HFU visit to discuss PT options and whatever else he may need. Since she cannot get him out, would it be okay to do a VV HFU?

## 2022-04-23 NOTE — Telephone Encounter (Signed)
Spoke to pt's wife. She did not give him the Seroquel last night. Michela Pitcher he was able to get up this morning and walk to the table and eat breakfast. She thinks he may be able to go to dialysis Friday. If not then, possibly Monday.She found a vitamin supplement that can be an alternative to the shake. She did not think he needed to do a visit with Dr Silvio Pate right now.

## 2022-04-29 ENCOUNTER — Telehealth: Payer: Self-pay | Admitting: Internal Medicine

## 2022-04-29 NOTE — Telephone Encounter (Signed)
[place] Night - Client TELEPHONE ADVICE RECORD [facility] Patient Name: Devin Harmon Gender: Male DOB: 1956-10-23 Age: 66 Y 10 M 21 D Return Phone Number: [phone] (Primary) Address[place]  [address] Client [place] Night - Client Client Site [place] Provider [place] - PHYSICIAN, NOT LISTED- MD Contact Type Call Who Is [place] / [place] Call Type Triage / Clinical Caller Name Sharyl Nimrod Return Phone Number [phone] (Primary) Facility Name [facility] Number [phone] Chief Complaint Unclassified Symptom Reason for Call Death Notification Initial Comment Caller has death notification Translation No Disp. Time Eilene Ghazi Time) Disposition Final User 05-28-2022 8:07:53 AM Clinical Call Yes Self, RN, Nira Conn Comments User: Robynn Pane, RN Date/Time Eilene Ghazi Time): 05-28-2022 8:07:25 AM Death Notification only, no answer at back line. Raquel Sarna with EMS told to check back with office later this mornin

## 2022-04-29 NOTE — Telephone Encounter (Signed)
This is certainly not unexpected and I will do the death certificate Left message for wife---will try to reach her later

## 2022-04-29 NOTE — Telephone Encounter (Signed)
Raquel Sarna with EMS called and said that the [employer] is contacting her about getting death certificate signed for pt. Callback for Raquel Sarna if info is needed about death is [phone]. [employer] number is [phone]

## 2022-04-29 NOTE — Telephone Encounter (Signed)
See notes below access nurse; Dr Silvio Pate already notified.

## 2022-04-30 NOTE — Telephone Encounter (Signed)
Yes---I will do the death certificate

## 2022-05-01 ENCOUNTER — Other Ambulatory Visit: Payer: Medicare Other | Admitting: Hospice

## 2022-05-01 DEATH — deceased

## 2022-08-26 ENCOUNTER — Encounter: Payer: Medicare Other | Admitting: Internal Medicine
